# Patient Record
Sex: Male | Born: 1940 | Race: Black or African American | Hispanic: No | Marital: Married | Smoking: Never smoker
Health system: Southern US, Community
[De-identification: ages and names within clinical notes are randomized; demographics above are authoritative.]

## PROBLEM LIST (undated history)

## (undated) DIAGNOSIS — E119 Type 2 diabetes mellitus without complications: Secondary | ICD-10-CM

## (undated) DIAGNOSIS — G309 Alzheimer's disease, unspecified: Secondary | ICD-10-CM

## (undated) DIAGNOSIS — F0280 Dementia in other diseases classified elsewhere without behavioral disturbance: Secondary | ICD-10-CM

## (undated) DIAGNOSIS — N183 Chronic kidney disease, stage 3 unspecified: Secondary | ICD-10-CM

## (undated) DIAGNOSIS — I639 Cerebral infarction, unspecified: Secondary | ICD-10-CM

## (undated) DIAGNOSIS — I10 Essential (primary) hypertension: Secondary | ICD-10-CM

## (undated) DIAGNOSIS — M109 Gout, unspecified: Secondary | ICD-10-CM

## (undated) HISTORY — PX: TIBIA FRACTURE SURGERY: SHX806

## (undated) HISTORY — DX: Gout, unspecified: M10.9

## (undated) HISTORY — DX: Essential (primary) hypertension: I10

---

## 2005-01-17 ENCOUNTER — Ambulatory Visit: Payer: Self-pay | Admitting: Unknown Physician Specialty

## 2007-02-17 ENCOUNTER — Ambulatory Visit: Payer: Self-pay | Admitting: Ophthalmology

## 2012-10-23 ENCOUNTER — Emergency Department: Payer: Self-pay | Admitting: Emergency Medicine

## 2012-10-23 LAB — CBC
HCT: 37.6 % — ABNORMAL LOW (ref 40.0–52.0)
HGB: 12.1 g/dL — ABNORMAL LOW (ref 13.0–18.0)
MCHC: 32.2 g/dL (ref 32.0–36.0)
RBC: 4.78 10*6/uL (ref 4.40–5.90)
RDW: 19.1 % — ABNORMAL HIGH (ref 11.5–14.5)
WBC: 8.4 10*3/uL (ref 3.8–10.6)

## 2012-10-23 LAB — COMPREHENSIVE METABOLIC PANEL
Alkaline Phosphatase: 65 U/L (ref 50–136)
Anion Gap: 7 (ref 7–16)
Bilirubin,Total: 0.2 mg/dL (ref 0.2–1.0)
Calcium, Total: 9.6 mg/dL (ref 8.5–10.1)
Chloride: 108 mmol/L — ABNORMAL HIGH (ref 98–107)
Co2: 22 mmol/L (ref 21–32)
EGFR (African American): >60
Potassium: 4.7 mmol/L (ref 3.5–5.1)
SGOT(AST): 19 U/L (ref 15–37)

## 2012-10-23 LAB — URINALYSIS, COMPLETE
Bacteria: NONE SEEN
Glucose,UR: NEGATIVE mg/dL (ref 0–75)
Hyaline Cast: 1
Ketone: NEGATIVE
Leukocyte Esterase: NEGATIVE
Ph: 5 (ref 4.5–8.0)
Protein: NEGATIVE
Squamous Epithelial: NONE SEEN
WBC UR: <1 /HPF (ref 0–5)

## 2012-10-23 LAB — CK TOTAL AND CKMB (NOT AT ARMC)
CK, Total: 100 U/L (ref 35–232)
CK-MB: 1.4 ng/mL (ref 0.5–3.6)

## 2012-10-23 LAB — LIPASE, BLOOD: Lipase: 197 U/L (ref 73–393)

## 2012-12-31 ENCOUNTER — Ambulatory Visit: Payer: Self-pay | Admitting: Internal Medicine

## 2013-01-15 LAB — COMPREHENSIVE METABOLIC PANEL
Alkaline Phosphatase: 75 U/L (ref 50–136)
Calcium, Total: 9.2 mg/dL (ref 8.5–10.1)
Chloride: 104 mmol/L (ref 98–107)
Co2: 27 mmol/L (ref 21–32)
Creatinine: 1.76 mg/dL — ABNORMAL HIGH (ref 0.60–1.30)
Osmolality: 278 (ref 275–301)
Potassium: 4.2 mmol/L (ref 3.5–5.1)
SGOT(AST): 15 U/L (ref 15–37)
SGPT (ALT): 13 U/L (ref 12–78)

## 2013-01-15 LAB — CBC WITH DIFFERENTIAL/PLATELET
Basophil #: 0 10*3/uL (ref 0.0–0.1)
Eosinophil #: 0.2 10*3/uL (ref 0.0–0.7)
HCT: 36.5 % — ABNORMAL LOW (ref 40.0–52.0)
HGB: 12.2 g/dL — ABNORMAL LOW (ref 13.0–18.0)
Lymphocyte #: 2.9 10*3/uL (ref 1.0–3.6)
Lymphocyte %: 25.7 %
MCH: 27 pg (ref 26.0–34.0)
MCHC: 33.3 g/dL (ref 32.0–36.0)
Monocyte %: 7.6 %
Neutrophil #: 7.2 10*3/uL — ABNORMAL HIGH (ref 1.4–6.5)
Platelet: 215 10*3/uL (ref 150–440)
RBC: 4.5 10*6/uL (ref 4.40–5.90)
RDW: 17.6 % — ABNORMAL HIGH (ref 11.5–14.5)
WBC: 11.2 10*3/uL — ABNORMAL HIGH (ref 3.8–10.6)

## 2013-01-15 LAB — ETHANOL
Ethanol %: <0.003 % (ref 0.000–0.080)
Ethanol: <3 mg/dL

## 2013-01-15 LAB — CK TOTAL AND CKMB (NOT AT ARMC): CK-MB: <0.5 ng/mL — ABNORMAL LOW (ref 0.5–3.6)

## 2013-01-15 LAB — PROTIME-INR: INR: 1.1

## 2013-01-16 LAB — CBC WITH DIFFERENTIAL/PLATELET
Basophil #: 0.1 10*3/uL (ref 0.0–0.1)
Eosinophil %: 4.3 %
HCT: 35 % — ABNORMAL LOW (ref 40.0–52.0)
Lymphocyte #: 2.8 10*3/uL (ref 1.0–3.6)
Lymphocyte %: 26.9 %
MCH: 27.6 pg (ref 26.0–34.0)
MCHC: 33.7 g/dL (ref 32.0–36.0)
Monocyte #: 0.9 x10 3/mm (ref 0.2–1.0)
Monocyte %: 8.7 %
Neutrophil %: 59.5 %
Platelet: 224 10*3/uL (ref 150–440)
RBC: 4.28 10*6/uL — ABNORMAL LOW (ref 4.40–5.90)
RDW: 17.8 % — ABNORMAL HIGH (ref 11.5–14.5)

## 2013-01-16 LAB — URINALYSIS, COMPLETE
Bacteria: NONE SEEN
Bilirubin,UR: NEGATIVE
Blood: NEGATIVE
Ketone: NEGATIVE
Leukocyte Esterase: NEGATIVE
Protein: NEGATIVE
RBC,UR: <1 /HPF (ref 0–5)
Specific Gravity: 1.012 (ref 1.003–1.030)
Squamous Epithelial: NONE SEEN

## 2013-01-16 LAB — BASIC METABOLIC PANEL
Chloride: 102 mmol/L (ref 98–107)
Co2: 28 mmol/L (ref 21–32)
EGFR (African American): 42 — ABNORMAL LOW
EGFR (Non-African Amer.): 36 — ABNORMAL LOW
Glucose: 104 mg/dL — ABNORMAL HIGH (ref 65–99)
Osmolality: 277 (ref 275–301)

## 2013-01-16 LAB — MAGNESIUM: Magnesium: 1.7 mg/dL — ABNORMAL LOW

## 2013-01-17 ENCOUNTER — Inpatient Hospital Stay: Payer: Self-pay | Admitting: Internal Medicine

## 2013-01-17 LAB — BASIC METABOLIC PANEL
BUN: 28 mg/dL — ABNORMAL HIGH (ref 7–18)
Calcium, Total: 9 mg/dL (ref 8.5–10.1)
Co2: 25 mmol/L (ref 21–32)
Creatinine: 1.43 mg/dL — ABNORMAL HIGH (ref 0.60–1.30)
EGFR (African American): 56 — ABNORMAL LOW
Osmolality: 275 (ref 275–301)
Sodium: 134 mmol/L — ABNORMAL LOW (ref 136–145)

## 2013-01-18 LAB — CBC WITH DIFFERENTIAL/PLATELET
Eosinophil #: 0.1 10*3/uL (ref 0.0–0.7)
Eosinophil %: 0.8 %
HCT: 33.4 % — ABNORMAL LOW (ref 40.0–52.0)
MCH: 27.1 pg (ref 26.0–34.0)
MCHC: 33.4 g/dL (ref 32.0–36.0)
Monocyte #: 1.4 x10 3/mm — ABNORMAL HIGH (ref 0.2–1.0)
Neutrophil #: 7.1 10*3/uL — ABNORMAL HIGH (ref 1.4–6.5)
WBC: 11.1 10*3/uL — ABNORMAL HIGH (ref 3.8–10.6)

## 2013-01-18 LAB — BASIC METABOLIC PANEL
Anion Gap: 6 — ABNORMAL LOW (ref 7–16)
BUN: 25 mg/dL — ABNORMAL HIGH (ref 7–18)
Calcium, Total: 9.3 mg/dL (ref 8.5–10.1)
Chloride: 103 mmol/L (ref 98–107)
Co2: 25 mmol/L (ref 21–32)
Creatinine: 1.37 mg/dL — ABNORMAL HIGH (ref 0.60–1.30)
Glucose: 117 mg/dL — ABNORMAL HIGH (ref 65–99)
Osmolality: 274 (ref 275–301)
Sodium: 134 mmol/L — ABNORMAL LOW (ref 136–145)

## 2013-01-21 LAB — WBC: WBC: 23.7 10*3/uL — ABNORMAL HIGH (ref 3.8–10.6)

## 2013-01-21 LAB — CREATININE, SERUM: EGFR (African American): 42 — ABNORMAL LOW

## 2013-01-22 LAB — CBC WITH DIFFERENTIAL/PLATELET
Eosinophil: 1 %
HGB: 10.8 g/dL — ABNORMAL LOW (ref 13.0–18.0)
MCH: 26.9 pg (ref 26.0–34.0)
MCV: 80 fL (ref 80–100)
Segmented Neutrophils: 73 %
Variant Lymphocyte - H1-Rlymph: 3 %
WBC: 18.4 10*3/uL — ABNORMAL HIGH (ref 3.8–10.6)

## 2013-01-22 LAB — BASIC METABOLIC PANEL
BUN: 60 mg/dL — ABNORMAL HIGH (ref 7–18)
Calcium, Total: 9.6 mg/dL (ref 8.5–10.1)
Chloride: 98 mmol/L (ref 98–107)
Co2: 22 mmol/L (ref 21–32)
Creatinine: 1.71 mg/dL — ABNORMAL HIGH (ref 0.60–1.30)
EGFR (African American): 45 — ABNORMAL LOW
EGFR (Non-African Amer.): 39 — ABNORMAL LOW
Glucose: 165 mg/dL — ABNORMAL HIGH (ref 65–99)
Osmolality: 278 (ref 275–301)

## 2013-01-22 LAB — PLATELET COUNT: Platelet: 227 10*3/uL (ref 150–440)

## 2013-01-23 LAB — CBC WITH DIFFERENTIAL/PLATELET
Basophil #: 0.1 10*3/uL (ref 0.0–0.1)
Eosinophil #: 0.1 10*3/uL (ref 0.0–0.7)
Eosinophil %: 0.5 %
HCT: 31.4 % — ABNORMAL LOW (ref 40.0–52.0)
HGB: 10.5 g/dL — ABNORMAL LOW (ref 13.0–18.0)
MCH: 26.7 pg (ref 26.0–34.0)
MCHC: 33.5 g/dL (ref 32.0–36.0)
Monocyte #: 1.6 x10 3/mm — ABNORMAL HIGH (ref 0.2–1.0)
Monocyte %: 7.8 %
Neutrophil #: 15.5 10*3/uL — ABNORMAL HIGH (ref 1.4–6.5)
Neutrophil %: 75.1 %
Platelet: 260 10*3/uL (ref 150–440)

## 2013-01-23 LAB — BASIC METABOLIC PANEL
Anion Gap: 11 (ref 7–16)
Calcium, Total: 9.8 mg/dL (ref 8.5–10.1)
Chloride: 98 mmol/L (ref 98–107)
Co2: 21 mmol/L (ref 21–32)
EGFR (African American): 39 — ABNORMAL LOW
EGFR (Non-African Amer.): 34 — ABNORMAL LOW
Osmolality: 283 (ref 275–301)
Potassium: 4.3 mmol/L (ref 3.5–5.1)
Sodium: 130 mmol/L — ABNORMAL LOW (ref 136–145)

## 2013-01-23 LAB — VANCOMYCIN, RANDOM: Vancomycin, Random: 29 ug/mL

## 2013-01-24 LAB — CBC WITH DIFFERENTIAL/PLATELET
HCT: 28.8 % — ABNORMAL LOW (ref 40.0–52.0)
Lymphocytes: 24 %
MCHC: 33.4 g/dL (ref 32.0–36.0)
MCV: 80 fL (ref 80–100)
Platelet: 294 10*3/uL (ref 150–440)
RBC: 3.58 10*6/uL — ABNORMAL LOW (ref 4.40–5.90)
RDW: 18.2 % — ABNORMAL HIGH (ref 11.5–14.5)
Variant Lymphocyte - H1-Rlymph: 2 %
WBC: 25.7 10*3/uL — ABNORMAL HIGH (ref 3.8–10.6)

## 2013-01-24 LAB — BASIC METABOLIC PANEL
Anion Gap: 11 (ref 7–16)
Calcium, Total: 9.5 mg/dL (ref 8.5–10.1)
Co2: 21 mmol/L (ref 21–32)
EGFR (African American): 33 — ABNORMAL LOW
EGFR (Non-African Amer.): 28 — ABNORMAL LOW
Potassium: 4.4 mmol/L (ref 3.5–5.1)
Sodium: 130 mmol/L — ABNORMAL LOW (ref 136–145)

## 2013-01-24 LAB — PROTEIN / CREATININE RATIO, URINE: Protein, Random Urine: 36 mg/dL — ABNORMAL HIGH (ref 0–12)

## 2013-01-25 LAB — CREATININE, SERUM
Creatinine: 1.86 mg/dL — ABNORMAL HIGH (ref 0.60–1.30)
EGFR (Non-African Amer.): 35 — ABNORMAL LOW

## 2013-01-25 LAB — VANCOMYCIN, TROUGH: Vancomycin, Trough: 23 ug/mL (ref 10–20)

## 2013-01-25 LAB — PROTEIN ELECTROPHORESIS(ARMC)

## 2013-01-26 LAB — BASIC METABOLIC PANEL
Anion Gap: 9 (ref 7–16)
Calcium, Total: 9.4 mg/dL (ref 8.5–10.1)
Co2: 19 mmol/L — ABNORMAL LOW (ref 21–32)
Creatinine: 1.46 mg/dL — ABNORMAL HIGH (ref 0.60–1.30)
EGFR (African American): 55 — ABNORMAL LOW
EGFR (Non-African Amer.): 47 — ABNORMAL LOW
Osmolality: 281 (ref 275–301)
Sodium: 134 mmol/L — ABNORMAL LOW (ref 136–145)

## 2013-01-26 LAB — CBC WITH DIFFERENTIAL/PLATELET
Eosinophil: 1 %
HCT: 26.3 % — ABNORMAL LOW (ref 40.0–52.0)
MCH: 28.6 pg (ref 26.0–34.0)
MCHC: 36.1 g/dL — ABNORMAL HIGH (ref 32.0–36.0)
Monocytes: 6 %
RBC: 3.31 10*6/uL — ABNORMAL LOW (ref 4.40–5.90)
Segmented Neutrophils: 72 %

## 2013-01-26 LAB — VANCOMYCIN, TROUGH: Vancomycin, Trough: 15 ug/mL (ref 10–20)

## 2013-01-26 LAB — UR PROT ELECTROPHORESIS, URINE RANDOM

## 2013-01-26 LAB — CULTURE, BLOOD (SINGLE)

## 2013-01-27 LAB — CBC WITH DIFFERENTIAL/PLATELET
HCT: 30.8 % — ABNORMAL LOW (ref 40.0–52.0)
HGB: 10.3 g/dL — ABNORMAL LOW (ref 13.0–18.0)
MCH: 26.8 pg (ref 26.0–34.0)
MCV: 80 fL (ref 80–100)
Metamyelocyte: 3 %
Monocytes: 7 %
Myelocyte: 1 %
NRBC/100 WBC: 1 /
Platelet: 229 10*3/uL (ref 150–440)
RDW: 18.6 % — ABNORMAL HIGH (ref 11.5–14.5)
Segmented Neutrophils: 75 %

## 2013-01-27 LAB — CREATININE, SERUM
EGFR (African American): >60
EGFR (Non-African Amer.): 53 — ABNORMAL LOW

## 2013-01-27 LAB — PROTIME-INR
INR: 1.4
Prothrombin Time: 16.7 secs — ABNORMAL HIGH (ref 11.5–14.7)

## 2013-01-27 LAB — CLOSTRIDIUM DIFFICILE BY PCR

## 2013-01-28 LAB — VANCOMYCIN, TROUGH: Vancomycin, Trough: 17 ug/mL (ref 10–20)

## 2013-01-28 LAB — APTT
Activated PTT: 56.9 secs — ABNORMAL HIGH (ref 23.6–35.9)
Activated PTT: 150.9 secs — ABNORMAL HIGH (ref 23.6–35.9)

## 2013-01-28 LAB — CBC WITH DIFFERENTIAL/PLATELET
Bands: 4 %
Basophil: 0 %
Eosinophil: 1 %
HGB: 9.8 g/dL — ABNORMAL LOW (ref 13.0–18.0)
Lymphocytes: 15 %
MCHC: 33.5 g/dL (ref 32.0–36.0)
MCV: 79 fL — ABNORMAL LOW (ref 80–100)
Metamyelocyte: 3 %
Myelocyte: 1 %
Platelet: 59 10*3/uL — ABNORMAL LOW (ref 150–440)
Segmented Neutrophils: 68 %
WBC: 19.1 10*3/uL — ABNORMAL HIGH (ref 3.8–10.6)

## 2013-01-28 LAB — CREATININE, SERUM
Creatinine: 1.45 mg/dL — ABNORMAL HIGH (ref 0.60–1.30)
EGFR (African American): 55 — ABNORMAL LOW

## 2013-01-28 LAB — PLATELET COUNT: Platelet: 52 10*3/uL — ABNORMAL LOW (ref 150–440)

## 2013-01-28 LAB — HEPATIC FUNCTION PANEL A (ARMC)
Albumin: 1.9 g/dL — ABNORMAL LOW (ref 3.4–5.0)
Alkaline Phosphatase: 102 U/L (ref 50–136)
Bilirubin, Direct: 1.2 mg/dL — ABNORMAL HIGH (ref 0.00–0.20)
Bilirubin,Total: 1.5 mg/dL — ABNORMAL HIGH (ref 0.2–1.0)
SGOT(AST): 30 U/L (ref 15–37)
Total Protein: 7.4 g/dL (ref 6.4–8.2)

## 2013-01-28 LAB — PROTIME-INR: INR: 1.4

## 2013-01-29 LAB — CBC WITH DIFFERENTIAL/PLATELET
Comment - H1-Com4: NORMAL
HGB: 9 g/dL — ABNORMAL LOW (ref 13.0–18.0)
Lymphocytes: 15 %
MCH: 26.2 pg (ref 26.0–34.0)
MCHC: 33.2 g/dL (ref 32.0–36.0)
MCV: 79 fL — ABNORMAL LOW (ref 80–100)
Monocytes: 6 %
NRBC/100 WBC: 1 /
RBC: 3.46 10*6/uL — ABNORMAL LOW (ref 4.40–5.90)
RDW: 18.2 % — ABNORMAL HIGH (ref 11.5–14.5)
WBC: 16 10*3/uL — ABNORMAL HIGH (ref 3.8–10.6)

## 2013-01-29 LAB — APTT
Activated PTT: >160 secs (ref 23.6–35.9)
Activated PTT: >160 secs (ref 23.6–35.9)
Activated PTT: >160 secs (ref 23.6–35.9)
Activated PTT: >160 secs (ref 23.6–35.9)

## 2013-01-30 LAB — CBC WITH DIFFERENTIAL/PLATELET
Eosinophil: 1 %
HCT: 28.1 % — ABNORMAL LOW (ref 40.0–52.0)
HGB: 9.4 g/dL — ABNORMAL LOW (ref 13.0–18.0)
MCH: 26.4 pg (ref 26.0–34.0)
MCHC: 33.6 g/dL (ref 32.0–36.0)
MCV: 79 fL — ABNORMAL LOW (ref 80–100)
Platelet: 24 10*3/uL — CL (ref 150–440)
RDW: 18.2 % — ABNORMAL HIGH (ref 11.5–14.5)
WBC: 17 10*3/uL — ABNORMAL HIGH (ref 3.8–10.6)

## 2013-01-30 LAB — APTT
Activated PTT: 102.3 secs — ABNORMAL HIGH (ref 23.6–35.9)
Activated PTT: 151.2 secs — ABNORMAL HIGH (ref 23.6–35.9)

## 2013-01-31 ENCOUNTER — Ambulatory Visit: Payer: Self-pay | Admitting: Nurse Practitioner

## 2013-01-31 LAB — CBC WITH DIFFERENTIAL/PLATELET
Basophil #: 0.1 10*3/uL (ref 0.0–0.1)
HCT: 27.6 % — ABNORMAL LOW (ref 40.0–52.0)
HGB: 9.3 g/dL — ABNORMAL LOW (ref 13.0–18.0)
Lymphocyte #: 2.7 10*3/uL (ref 1.0–3.6)
Lymphocyte %: 18 %
MCV: 78 fL — ABNORMAL LOW (ref 80–100)
Monocyte #: 1.3 x10 3/mm — ABNORMAL HIGH (ref 0.2–1.0)
Neutrophil #: 10.9 10*3/uL — ABNORMAL HIGH (ref 1.4–6.5)
RBC: 3.53 10*6/uL — ABNORMAL LOW (ref 4.40–5.90)
RDW: 18.2 % — ABNORMAL HIGH (ref 11.5–14.5)
WBC: 15.1 10*3/uL — ABNORMAL HIGH (ref 3.8–10.6)

## 2013-01-31 LAB — APTT
Activated PTT: 102 secs — ABNORMAL HIGH (ref 23.6–35.9)
Activated PTT: 101.9 secs — ABNORMAL HIGH (ref 23.6–35.9)
Activated PTT: 74.6 secs — ABNORMAL HIGH (ref 23.6–35.9)

## 2013-01-31 LAB — BASIC METABOLIC PANEL
Anion Gap: 7 (ref 7–16)
Calcium, Total: 9.2 mg/dL (ref 8.5–10.1)
Co2: 22 mmol/L (ref 21–32)
Creatinine: 1.43 mg/dL — ABNORMAL HIGH (ref 0.60–1.30)
EGFR (African American): 56 — ABNORMAL LOW
EGFR (Non-African Amer.): 49 — ABNORMAL LOW
Glucose: 134 mg/dL — ABNORMAL HIGH (ref 65–99)
Osmolality: 284 (ref 275–301)
Potassium: 4.4 mmol/L (ref 3.5–5.1)
Sodium: 134 mmol/L — ABNORMAL LOW (ref 136–145)

## 2013-02-01 LAB — PLATELET COUNT: Platelet: 35 10*3/uL — ABNORMAL LOW (ref 150–440)

## 2013-02-01 LAB — CBC WITH DIFFERENTIAL/PLATELET
Bands: 1 %
HCT: 26.6 % — ABNORMAL LOW (ref 40.0–52.0)
HGB: 8.8 g/dL — ABNORMAL LOW (ref 13.0–18.0)
Lymphocytes: 24 %
Monocytes: 10 %
Platelet: 35 10*3/uL — ABNORMAL LOW (ref 150–440)
RBC: 3.38 10*6/uL — ABNORMAL LOW (ref 4.40–5.90)
RDW: 18.8 % — ABNORMAL HIGH (ref 11.5–14.5)
Segmented Neutrophils: 63 %
WBC: 12.3 10*3/uL — ABNORMAL HIGH (ref 3.8–10.6)

## 2013-02-01 LAB — APTT: Activated PTT: 63.9 secs — ABNORMAL HIGH (ref 23.6–35.9)

## 2013-02-02 LAB — BASIC METABOLIC PANEL
BUN: 31 mg/dL — ABNORMAL HIGH (ref 7–18)
Co2: 22 mmol/L (ref 21–32)
Creatinine: 1.1 mg/dL (ref 0.60–1.30)
Potassium: 4.7 mmol/L (ref 3.5–5.1)

## 2013-02-02 LAB — CBC WITH DIFFERENTIAL/PLATELET
Basophil #: 0 10*3/uL (ref 0.0–0.1)
Basophil %: 0.4 %
Eosinophil %: 2.1 %
HCT: 28.4 % — ABNORMAL LOW (ref 40.0–52.0)
Lymphocyte #: 2.9 10*3/uL (ref 1.0–3.6)
MCH: 26 pg (ref 26.0–34.0)
MCHC: 33.2 g/dL (ref 32.0–36.0)
Neutrophil %: 64.9 %
Platelet: 40 10*3/uL — ABNORMAL LOW (ref 150–440)
RDW: 18.4 % — ABNORMAL HIGH (ref 11.5–14.5)
WBC: 12.4 10*3/uL — ABNORMAL HIGH (ref 3.8–10.6)

## 2013-02-02 LAB — EXPECTORATED SPUTUM ASSESSMENT W REFEX TO RESP CULTURE

## 2013-02-02 LAB — APTT: Activated PTT: 60.1 secs — ABNORMAL HIGH (ref 23.6–35.9)

## 2013-02-03 LAB — CBC WITH DIFFERENTIAL/PLATELET
Basophil #: 0 10*3/uL (ref 0.0–0.1)
Lymphocyte %: 22.8 %
MCH: 26.3 pg (ref 26.0–34.0)
Monocyte #: 1 x10 3/mm (ref 0.2–1.0)
Monocyte %: 10.5 %
Neutrophil #: 6.4 10*3/uL (ref 1.4–6.5)
Platelet: 54 10*3/uL — ABNORMAL LOW (ref 150–440)
RBC: 3.38 10*6/uL — ABNORMAL LOW (ref 4.40–5.90)
RDW: 18.3 % — ABNORMAL HIGH (ref 11.5–14.5)
WBC: 9.9 10*3/uL (ref 3.8–10.6)

## 2013-02-04 LAB — APTT: Activated PTT: 52.4 secs — ABNORMAL HIGH (ref 23.6–35.9)

## 2013-02-04 LAB — CBC WITH DIFFERENTIAL/PLATELET
Basophil #: 0.1 10*3/uL (ref 0.0–0.1)
Eosinophil #: 0.3 10*3/uL (ref 0.0–0.7)
HGB: 8.4 g/dL — ABNORMAL LOW (ref 13.0–18.0)
Lymphocyte #: 2.8 10*3/uL (ref 1.0–3.6)
MCH: 26.3 pg (ref 26.0–34.0)
Monocyte #: 1 x10 3/mm (ref 0.2–1.0)
Neutrophil %: 59.3 %
Platelet: 68 10*3/uL — ABNORMAL LOW (ref 150–440)
RBC: 3.21 10*6/uL — ABNORMAL LOW (ref 4.40–5.90)
WBC: 10.1 10*3/uL (ref 3.8–10.6)

## 2013-02-04 LAB — PROTIME-INR: INR: 1.9

## 2013-02-05 LAB — PROTIME-INR: INR: 1.9

## 2013-02-06 LAB — CBC WITH DIFFERENTIAL/PLATELET
Basophil #: 0.1 10*3/uL (ref 0.0–0.1)
Basophil %: 1.1 %
Eosinophil %: 2.8 %
Lymphocyte #: 2.7 10*3/uL (ref 1.0–3.6)
Lymphocyte %: 26.4 %
MCH: 25.8 pg — ABNORMAL LOW (ref 26.0–34.0)
MCHC: 32.9 g/dL (ref 32.0–36.0)
MCV: 79 fL — ABNORMAL LOW (ref 80–100)
Monocyte #: 0.7 x10 3/mm (ref 0.2–1.0)
Monocyte %: 7.1 %
Neutrophil #: 6.4 10*3/uL (ref 1.4–6.5)
Platelet: 95 10*3/uL — ABNORMAL LOW (ref 150–440)
RBC: 3.2 10*6/uL — ABNORMAL LOW (ref 4.40–5.90)
RDW: 18.2 % — ABNORMAL HIGH (ref 11.5–14.5)

## 2013-02-06 LAB — APTT: Activated PTT: 62.4 secs — ABNORMAL HIGH (ref 23.6–35.9)

## 2013-02-07 LAB — CBC WITH DIFFERENTIAL/PLATELET
Basophil %: 1 %
Eosinophil %: 2.4 %
Lymphocyte %: 29.9 %
MCH: 25.7 pg — ABNORMAL LOW (ref 26.0–34.0)
MCV: 77 fL — ABNORMAL LOW (ref 80–100)
Monocyte %: 8.5 %
Neutrophil #: 6.6 10*3/uL — ABNORMAL HIGH (ref 1.4–6.5)
Neutrophil %: 58.2 %
Platelet: 114 10*3/uL — ABNORMAL LOW (ref 150–440)
RBC: 3.18 10*6/uL — ABNORMAL LOW (ref 4.40–5.90)
RDW: 18.4 % — ABNORMAL HIGH (ref 11.5–14.5)

## 2013-02-07 LAB — PLATELET COUNT: Platelet: 122 10*3/uL — ABNORMAL LOW (ref 150–440)

## 2013-02-07 LAB — APTT: Activated PTT: 59.7 secs — ABNORMAL HIGH (ref 23.6–35.9)

## 2013-02-10 ENCOUNTER — Inpatient Hospital Stay: Payer: Self-pay | Admitting: Internal Medicine

## 2013-02-10 LAB — URINALYSIS, COMPLETE
Bilirubin,UR: NEGATIVE
Blood: NEGATIVE
Glucose,UR: NEGATIVE mg/dL (ref 0–75)
Ketone: NEGATIVE
Leukocyte Esterase: NEGATIVE
Nitrite: NEGATIVE
Protein: NEGATIVE
RBC,UR: 1 /HPF (ref 0–5)
Specific Gravity: 1.014 (ref 1.003–1.030)
Squamous Epithelial: <1
WBC UR: 2 /HPF (ref 0–5)

## 2013-02-10 LAB — COMPREHENSIVE METABOLIC PANEL
Alkaline Phosphatase: 48 U/L — ABNORMAL LOW (ref 50–136)
Anion Gap: 16 (ref 7–16)
Bilirubin,Total: 1 mg/dL (ref 0.2–1.0)
Chloride: 109 mmol/L — ABNORMAL HIGH (ref 98–107)
Co2: 16 mmol/L — ABNORMAL LOW (ref 21–32)
Creatinine: 1.54 mg/dL — ABNORMAL HIGH (ref 0.60–1.30)
EGFR (African American): 51 — ABNORMAL LOW
EGFR (Non-African Amer.): 44 — ABNORMAL LOW
Osmolality: 287 (ref 275–301)
Potassium: 3.5 mmol/L (ref 3.5–5.1)
Sodium: 141 mmol/L (ref 136–145)
Total Protein: 5.7 g/dL — ABNORMAL LOW (ref 6.4–8.2)

## 2013-02-10 LAB — CBC WITH DIFFERENTIAL/PLATELET
Eosinophil #: 0.2 10*3/uL (ref 0.0–0.7)
Eosinophil %: 1.7 %
HCT: 25 % — ABNORMAL LOW (ref 40.0–52.0)
HGB: 8.2 g/dL — ABNORMAL LOW (ref 13.0–18.0)
Lymphocyte %: 25 %
MCH: 25.5 pg — ABNORMAL LOW (ref 26.0–34.0)
MCHC: 32.8 g/dL (ref 32.0–36.0)
MCV: 78 fL — ABNORMAL LOW (ref 80–100)
Monocyte #: 0.7 x10 3/mm (ref 0.2–1.0)
Neutrophil %: 65.2 %
Platelet: 195 10*3/uL (ref 150–440)
RBC: 3.22 10*6/uL — ABNORMAL LOW (ref 4.40–5.90)
WBC: 10.5 10*3/uL (ref 3.8–10.6)

## 2013-02-10 LAB — PROTIME-INR: INR: 3.3

## 2013-02-11 LAB — BASIC METABOLIC PANEL
BUN: 35 mg/dL — ABNORMAL HIGH (ref 7–18)
Calcium, Total: 8.8 mg/dL (ref 8.5–10.1)
Co2: 23 mmol/L (ref 21–32)
Creatinine: 1.55 mg/dL — ABNORMAL HIGH (ref 0.60–1.30)
EGFR (Non-African Amer.): 44 — ABNORMAL LOW
Glucose: 114 mg/dL — ABNORMAL HIGH (ref 65–99)
Osmolality: 271 (ref 275–301)
Potassium: 4.7 mmol/L (ref 3.5–5.1)
Sodium: 131 mmol/L — ABNORMAL LOW (ref 136–145)

## 2013-02-11 LAB — CBC WITH DIFFERENTIAL/PLATELET
Basophil #: 0.1 10*3/uL (ref 0.0–0.1)
Basophil %: 0.9 %
Eosinophil #: 0.4 10*3/uL (ref 0.0–0.7)
HCT: 23.9 % — ABNORMAL LOW (ref 40.0–52.0)
HGB: 7.9 g/dL — ABNORMAL LOW (ref 13.0–18.0)
MCHC: 33 g/dL (ref 32.0–36.0)
Monocyte #: 1 x10 3/mm (ref 0.2–1.0)
Monocyte %: 9.4 %
Neutrophil #: 5.9 10*3/uL (ref 1.4–6.5)
Neutrophil %: 55 %
RBC: 3.11 10*6/uL — ABNORMAL LOW (ref 4.40–5.90)
RDW: 18.8 % — ABNORMAL HIGH (ref 11.5–14.5)

## 2013-03-05 ENCOUNTER — Inpatient Hospital Stay: Payer: Self-pay | Admitting: Internal Medicine

## 2013-03-05 LAB — COMPREHENSIVE METABOLIC PANEL
Albumin: 2.2 g/dL — ABNORMAL LOW (ref 3.4–5.0)
Anion Gap: 10 (ref 7–16)
BUN: 45 mg/dL — ABNORMAL HIGH (ref 7–18)
Bilirubin,Total: 0.4 mg/dL (ref 0.2–1.0)
Calcium, Total: 9.9 mg/dL (ref 8.5–10.1)
Creatinine: 1.66 mg/dL — ABNORMAL HIGH (ref 0.60–1.30)
EGFR (African American): 47 — ABNORMAL LOW
EGFR (Non-African Amer.): 41 — ABNORMAL LOW
SGOT(AST): 40 U/L — ABNORMAL HIGH (ref 15–37)
Sodium: 134 mmol/L — ABNORMAL LOW (ref 136–145)
Total Protein: 8.1 g/dL (ref 6.4–8.2)

## 2013-03-05 LAB — URINALYSIS, COMPLETE
Blood: NEGATIVE
Ketone: NEGATIVE
Nitrite: NEGATIVE
Protein: NEGATIVE
RBC,UR: <1 /HPF (ref 0–5)
WBC UR: 4 /HPF (ref 0–5)

## 2013-03-05 LAB — PROTIME-INR
INR: 1.6
Prothrombin Time: 18.6 secs — ABNORMAL HIGH (ref 11.5–14.7)

## 2013-03-05 LAB — CBC
HCT: 24.5 % — ABNORMAL LOW (ref 40.0–52.0)
MCH: 23.9 pg — ABNORMAL LOW (ref 26.0–34.0)
MCHC: 32.4 g/dL (ref 32.0–36.0)
Platelet: 647 10*3/uL — ABNORMAL HIGH (ref 150–440)
RBC: 3.31 10*6/uL — ABNORMAL LOW (ref 4.40–5.90)
WBC: 14.1 10*3/uL — ABNORMAL HIGH (ref 3.8–10.6)

## 2013-03-06 LAB — BASIC METABOLIC PANEL
Calcium, Total: 9.6 mg/dL (ref 8.5–10.1)
Chloride: 100 mmol/L (ref 98–107)
Creatinine: 1.52 mg/dL — ABNORMAL HIGH (ref 0.60–1.30)
EGFR (African American): 52 — ABNORMAL LOW
Osmolality: 277 (ref 275–301)
Potassium: 4.6 mmol/L (ref 3.5–5.1)

## 2013-03-06 LAB — CBC WITH DIFFERENTIAL/PLATELET
Basophil #: 0.1 10*3/uL (ref 0.0–0.1)
HGB: 7.8 g/dL — ABNORMAL LOW (ref 13.0–18.0)
Lymphocyte %: 24.2 %
MCH: 24.4 pg — ABNORMAL LOW (ref 26.0–34.0)
MCHC: 32.7 g/dL (ref 32.0–36.0)
MCV: 75 fL — ABNORMAL LOW (ref 80–100)
Monocyte #: 0.5 x10 3/mm (ref 0.2–1.0)
Neutrophil %: 67.6 %
Platelet: 561 10*3/uL — ABNORMAL HIGH (ref 150–440)
RBC: 3.17 10*6/uL — ABNORMAL LOW (ref 4.40–5.90)
RDW: 19.2 % — ABNORMAL HIGH (ref 11.5–14.5)
WBC: 11.1 10*3/uL — ABNORMAL HIGH (ref 3.8–10.6)

## 2013-03-06 LAB — IRON AND TIBC
Iron Bind.Cap.(Total): 133 ug/dL — ABNORMAL LOW (ref 250–450)
Iron Saturation: 28 %
Iron: 37 ug/dL — ABNORMAL LOW (ref 65–175)

## 2013-03-06 LAB — PROTIME-INR
INR: 1.8
Prothrombin Time: 20.9 secs — ABNORMAL HIGH (ref 11.5–14.7)

## 2013-03-06 LAB — HEMOGLOBIN A1C: Hemoglobin A1C: 6.6 % — ABNORMAL HIGH (ref 4.2–6.3)

## 2013-03-06 LAB — AMMONIA: Ammonia, Plasma: <25 mcmol/L (ref 11–32)

## 2013-03-06 LAB — TSH: Thyroid Stimulating Horm: 2.41 u[IU]/mL

## 2013-03-07 LAB — COMPREHENSIVE METABOLIC PANEL
Albumin: 1.9 g/dL — ABNORMAL LOW (ref 3.4–5.0)
Alkaline Phosphatase: 66 U/L (ref 50–136)
BUN: 27 mg/dL — ABNORMAL HIGH (ref 7–18)
Bilirubin,Total: 0.4 mg/dL (ref 0.2–1.0)
Co2: 25 mmol/L (ref 21–32)
Creatinine: 1.31 mg/dL — ABNORMAL HIGH (ref 0.60–1.30)
EGFR (African American): >60
EGFR (Non-African Amer.): 54 — ABNORMAL LOW
Osmolality: 264 (ref 275–301)
Potassium: 3.9 mmol/L (ref 3.5–5.1)
SGOT(AST): 35 U/L (ref 15–37)
SGPT (ALT): 30 U/L (ref 12–78)
Sodium: 128 mmol/L — ABNORMAL LOW (ref 136–145)
Total Protein: 7 g/dL (ref 6.4–8.2)

## 2013-03-07 LAB — CBC WITH DIFFERENTIAL/PLATELET
Basophil #: 0.1 10*3/uL (ref 0.0–0.1)
Basophil %: 1.1 %
Eosinophil %: 2.7 %
HCT: 22 % — ABNORMAL LOW (ref 40.0–52.0)
Lymphocyte #: 4 10*3/uL — ABNORMAL HIGH (ref 1.0–3.6)
Lymphocyte %: 38.7 %
MCV: 75 fL — ABNORMAL LOW (ref 80–100)
Monocyte %: 7 %
Neutrophil #: 5.2 10*3/uL (ref 1.4–6.5)
Platelet: 507 10*3/uL — ABNORMAL HIGH (ref 150–440)
RDW: 18.8 % — ABNORMAL HIGH (ref 11.5–14.5)

## 2013-03-07 LAB — OCCULT BLOOD X 1 CARD TO LAB, STOOL: Occult Blood, Feces: NEGATIVE

## 2013-03-08 LAB — PROTIME-INR
INR: 2.2
Prothrombin Time: 23.9 secs — ABNORMAL HIGH (ref 11.5–14.7)

## 2013-03-09 LAB — CBC WITH DIFFERENTIAL/PLATELET
Basophil %: 2.3 %
HCT: 21.8 % — ABNORMAL LOW (ref 40.0–52.0)
HGB: 7.1 g/dL — ABNORMAL LOW (ref 13.0–18.0)
Lymphocyte %: 45.2 %
MCH: 24.2 pg — ABNORMAL LOW (ref 26.0–34.0)
MCV: 74 fL — ABNORMAL LOW (ref 80–100)
Monocyte #: 0.9 x10 3/mm (ref 0.2–1.0)
Monocyte %: 8.9 %
Neutrophil %: 38.9 %
Platelet: 463 10*3/uL — ABNORMAL HIGH (ref 150–440)
RBC: 2.94 10*6/uL — ABNORMAL LOW (ref 4.40–5.90)

## 2013-03-09 LAB — BASIC METABOLIC PANEL
BUN: 13 mg/dL (ref 7–18)
Calcium, Total: 9 mg/dL (ref 8.5–10.1)
Chloride: 104 mmol/L (ref 98–107)
Creatinine: 1.15 mg/dL (ref 0.60–1.30)
EGFR (Non-African Amer.): >60
Glucose: 134 mg/dL — ABNORMAL HIGH (ref 65–99)
Osmolality: 268 (ref 275–301)
Potassium: 4.3 mmol/L (ref 3.5–5.1)

## 2013-03-09 LAB — PROTIME-INR: INR: 2.1

## 2013-03-10 LAB — PROTIME-INR
INR: 1.8
Prothrombin Time: 20.3 secs — ABNORMAL HIGH (ref 11.5–14.7)

## 2013-03-10 LAB — HEMOGLOBIN: HGB: 7 g/dL — ABNORMAL LOW (ref 13.0–18.0)

## 2013-03-11 LAB — CULTURE, BLOOD (SINGLE)

## 2013-04-03 ENCOUNTER — Inpatient Hospital Stay: Payer: Self-pay | Admitting: Internal Medicine

## 2013-04-03 LAB — DRUG SCREEN, URINE
Cannabinoid 50 Ng, Ur ~~LOC~~: NEGATIVE (ref ?–50)
Cocaine Metabolite,Ur ~~LOC~~: NEGATIVE (ref ?–300)
Opiate, Ur Screen: NEGATIVE (ref ?–300)
Phencyclidine (PCP) Ur S: NEGATIVE (ref ?–25)
Tricyclic, Ur Screen: NEGATIVE (ref ?–1000)

## 2013-04-03 LAB — URINALYSIS, COMPLETE
Bilirubin,UR: NEGATIVE
Blood: NEGATIVE
Glucose,UR: NEGATIVE mg/dL (ref 0–75)
Leukocyte Esterase: NEGATIVE
Nitrite: NEGATIVE
Ph: 5 (ref 4.5–8.0)
Protein: NEGATIVE
Specific Gravity: 1.014 (ref 1.003–1.030)
Squamous Epithelial: NONE SEEN
WBC UR: 2 /HPF (ref 0–5)

## 2013-04-03 LAB — CBC WITH DIFFERENTIAL/PLATELET
Basophil #: 0.1 10*3/uL (ref 0.0–0.1)
Basophil %: 0.8 %
Eosinophil %: 2.6 %
Lymphocyte #: 2.8 10*3/uL (ref 1.0–3.6)
Lymphocyte %: 24.1 %
MCH: 23.7 pg — ABNORMAL LOW (ref 26.0–34.0)
MCHC: 32.3 g/dL (ref 32.0–36.0)
MCV: 73 fL — ABNORMAL LOW (ref 80–100)
Monocyte #: 0.5 x10 3/mm (ref 0.2–1.0)
Monocyte %: 4 %
Neutrophil #: 8 10*3/uL — ABNORMAL HIGH (ref 1.4–6.5)
Platelet: 514 10*3/uL — ABNORMAL HIGH (ref 150–440)
RBC: 3.64 10*6/uL — ABNORMAL LOW (ref 4.40–5.90)
RDW: 20.3 % — ABNORMAL HIGH (ref 11.5–14.5)

## 2013-04-03 LAB — COMPREHENSIVE METABOLIC PANEL
Albumin: 2.2 g/dL — ABNORMAL LOW (ref 3.4–5.0)
Alkaline Phosphatase: 75 U/L (ref 50–136)
Bilirubin,Total: 0.4 mg/dL (ref 0.2–1.0)
Calcium, Total: 9 mg/dL (ref 8.5–10.1)
Chloride: 102 mmol/L (ref 98–107)
Co2: 24 mmol/L (ref 21–32)
Creatinine: 2.7 mg/dL — ABNORMAL HIGH (ref 0.60–1.30)
Glucose: 65 mg/dL (ref 65–99)
Potassium: 3.9 mmol/L (ref 3.5–5.1)
Sodium: 134 mmol/L — ABNORMAL LOW (ref 136–145)

## 2013-04-03 LAB — APTT: Activated PTT: 52.1 secs — ABNORMAL HIGH (ref 23.6–35.9)

## 2013-04-04 LAB — BASIC METABOLIC PANEL
BUN: 25 mg/dL — ABNORMAL HIGH (ref 7–18)
Chloride: 99 mmol/L (ref 98–107)
Creatinine: 2.95 mg/dL — ABNORMAL HIGH (ref 0.60–1.30)
EGFR (African American): 23 — ABNORMAL LOW
EGFR (Non-African Amer.): 20 — ABNORMAL LOW
Glucose: 58 mg/dL — ABNORMAL LOW (ref 65–99)
Osmolality: 267 (ref 275–301)
Potassium: 4 mmol/L (ref 3.5–5.1)

## 2013-04-04 LAB — MAGNESIUM: Magnesium: 2.1 mg/dL

## 2013-04-05 LAB — URINALYSIS, COMPLETE
Bilirubin,UR: NEGATIVE
Glucose,UR: NEGATIVE mg/dL
Hyaline Cast: 11
Ketone: NEGATIVE
Nitrite: NEGATIVE
Ph: 5
Protein: NEGATIVE
RBC,UR: 4 /HPF
Specific Gravity: 1.008
Squamous Epithelial: <1
WBC UR: 9 /HPF

## 2013-04-05 LAB — CBC WITH DIFFERENTIAL/PLATELET
Basophil #: 0 10*3/uL (ref 0.0–0.1)
Eosinophil %: 0 %
HCT: 23.3 % — ABNORMAL LOW (ref 40.0–52.0)
Lymphocyte %: 21 %
MCH: 24.5 pg — ABNORMAL LOW (ref 26.0–34.0)
MCV: 73 fL — ABNORMAL LOW (ref 80–100)
Monocyte #: 0.1 x10 3/mm — ABNORMAL LOW (ref 0.2–1.0)
Neutrophil %: 77.7 %
Platelet: 473 10*3/uL — ABNORMAL HIGH (ref 150–440)
RDW: 20.3 % — ABNORMAL HIGH (ref 11.5–14.5)
WBC: 10.2 10*3/uL (ref 3.8–10.6)

## 2013-04-05 LAB — BASIC METABOLIC PANEL
Anion Gap: 3 — ABNORMAL LOW (ref 7–16)
Calcium, Total: 8.7 mg/dL (ref 8.5–10.1)
Chloride: 92 mmol/L — ABNORMAL LOW (ref 98–107)
Co2: 28 mmol/L (ref 21–32)
Creatinine: 2.22 mg/dL — ABNORMAL HIGH (ref 0.60–1.30)
Potassium: 4.7 mmol/L (ref 3.5–5.1)

## 2013-04-05 LAB — PROTIME-INR
INR: 2.1
Prothrombin Time: 23.1 s — ABNORMAL HIGH

## 2013-04-05 LAB — SODIUM: Sodium: 126 mmol/L — ABNORMAL LOW (ref 136–145)

## 2013-04-06 LAB — CBC WITH DIFFERENTIAL/PLATELET
Basophil %: 0.2 %
Eosinophil #: 0 10*3/uL (ref 0.0–0.7)
Eosinophil %: 0.2 %
HCT: 21.8 % — ABNORMAL LOW (ref 40.0–52.0)
HGB: 7.4 g/dL — ABNORMAL LOW (ref 13.0–18.0)
MCHC: 33.8 g/dL (ref 32.0–36.0)
MCV: 72 fL — ABNORMAL LOW (ref 80–100)
Monocyte %: 6.5 %
Neutrophil #: 5.9 10*3/uL (ref 1.4–6.5)
Neutrophil %: 56.3 %
RBC: 3.02 10*6/uL — ABNORMAL LOW (ref 4.40–5.90)

## 2013-04-06 LAB — BASIC METABOLIC PANEL
BUN: 30 mg/dL — ABNORMAL HIGH (ref 7–18)
Calcium, Total: 8.6 mg/dL (ref 8.5–10.1)
Co2: 28 mmol/L (ref 21–32)
Creatinine: 1.64 mg/dL — ABNORMAL HIGH (ref 0.60–1.30)
EGFR (African American): 48 — ABNORMAL LOW
EGFR (Non-African Amer.): 41 — ABNORMAL LOW
Glucose: 58 mg/dL — ABNORMAL LOW (ref 65–99)
Osmolality: 270 (ref 275–301)
Potassium: 4.1 mmol/L (ref 3.5–5.1)
Sodium: 133 mmol/L — ABNORMAL LOW (ref 136–145)

## 2013-04-06 LAB — PROTIME-INR: Prothrombin Time: 22.1 secs — ABNORMAL HIGH (ref 11.5–14.7)

## 2013-04-09 LAB — CULTURE, BLOOD (SINGLE)

## 2013-05-13 ENCOUNTER — Emergency Department: Payer: Self-pay | Admitting: Emergency Medicine

## 2013-05-13 LAB — COMPREHENSIVE METABOLIC PANEL
Alkaline Phosphatase: 67 U/L
Calcium, Total: 10.1 mg/dL (ref 8.5–10.1)
Co2: 26 mmol/L (ref 21–32)
Creatinine: 1.17 mg/dL (ref 0.60–1.30)
EGFR (African American): >60
EGFR (Non-African Amer.): >60
Glucose: 171 mg/dL — ABNORMAL HIGH (ref 65–99)
Total Protein: 7.7 g/dL (ref 6.4–8.2)

## 2013-05-13 LAB — TROPONIN I: Troponin-I: <0.02 ng/mL

## 2013-05-13 LAB — CBC
HGB: 9.3 g/dL — ABNORMAL LOW (ref 13.0–18.0)
MCHC: 31.9 g/dL — ABNORMAL LOW (ref 32.0–36.0)
RDW: 20.5 % — ABNORMAL HIGH (ref 11.5–14.5)

## 2013-08-25 ENCOUNTER — Encounter: Payer: Self-pay | Admitting: Surgery

## 2013-08-31 ENCOUNTER — Encounter: Payer: Self-pay | Admitting: Surgery

## 2013-09-30 ENCOUNTER — Encounter: Payer: Self-pay | Admitting: Surgery

## 2013-10-31 ENCOUNTER — Encounter: Payer: Self-pay | Admitting: Surgery

## 2014-09-22 NOTE — Consult Note (Signed)
Chief Complaint and History:  Referring Physician Dr. Madelon LipsVacchani   Chief Complaint hypoglycemia   Allergies:  Heparin: Other  Assessment/Plan:  Assessment/Plan 74 y/o M with h/o type 2 diabetes admitted 10/4 with AMS and hypoglycemia. Work-up indicated that AMS was due to hypoglycemia caused by inadvertent use of gimpeiride. He had admission for sufonylurea-induced hypoglycemia last month and was advised to stop glimepiride however it seems that was not done. Sugars have been in the 100-140 range in last 48 hrs. Was on D5 until this AM when it was stopped. Eating fairly well, in part due to wife assisting with feeding and in part due to addn of Megace.  A/ Type 2 diabetes Hypoglycemia due to use of sulfonylurea, resolved Confusion at baseline, likely with element of dementia  P -Avoid sulfonylurea -Agree with stopping the IV Dextrose -Monitor sugars q4 hrs. If sugars inc to >150, could consider adding a renally dosed DDP-IV agent such as Januvia 50 mg daily. This is an oral diabetes agent which does not cause hypoglycemia.  Full consult was dictated. I will follow along.   Electronic Signatures: Raj JanusSolum, Ramzey Petrovic M (MD)  (Signed 09-Oct-14 14:01)  Authored: Chief Complaint and History, ALLERGIES, Assessment/Plan   Last Updated: 09-Oct-14 14:01 by Raj JanusSolum, Izzah Pasqua M (MD)
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
## 2014-09-22 NOTE — Discharge Summary (Signed)
PATIENT NAME:  Noah Bush, Noah Bush MR#:  [id number] DATE OF BIRTH:  1941/01/13  ADDENDUM:  Continuation of Discharge Summary.  HISTORY OF PRESENT ILLNESS: The patient is a 74 year old male patient who was discharged to [facility] on September 8. He came in because of altered mental status, hypoglycemia. The patient discharged to [facility] on glyburide and sliding scale. The patient's blood glucose was 30 and the patient's family reported to staff the patient was not acting right, and he had a blood glucose of 30. They gave an amp of D50 and the patient was monitored. The patient was subsequently moved to the hospital. The patient's blood sugar yesterday afternoon in the ER was 59 and persistently had low blood sugar of 49, and the patient was given Glucagon 1 mg by EMS. In the ER, the patient's blood sugar was 63 again, and the patient also was somnolent, so the patient was seen by Dr. Randol KernElgergawy and admitted to ICU, initially started on D10 water at 120 mL/hour.   The patient's oral hypoglycemics were stopped. The patient was monitored in the ICU with Accu-Cheks every 1 hour and the patient received multiple doses of D50 IV push, started on octreotide drip for sulfonylurea-induced hypoglycemia. The patient's blood sugars improved. He was able to come off octreotide drip and also D10. Right now his blood sugar this morning is 101 but previously they were around 150s, 120s, and also 200s.   The patient still has poor p.o. intake. I know him from previous hospitalization, very poorly motivated even to walk with physical therapy. The patient is at his baseline at this time. He is oriented and he knows where he is. He denies any complaints. The patient is stable to go to   health care and resume all the medications as I described except the Coumadin.   The patient needs PT-INR checkup and resume Coumadin for his deep vein thrombosis.   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 Randol KernElgergawy and previous detailed summaries for details.   DISCHARGE VITAL SIGNS: Temperature this morning is 97.8. Blood pressure 110/65, saturations 90% on room air and pulse is 70.   TIME SPENT: More than 30 minutes on dictation.   CODE STATUS: FULL CODE.    ____________________________ Katha HammingSnehalatha Priyal Musquiz, MD sk:np D: 02/11/2013 11:58:00 ET T: 02/11/2013 21:57:34 ET JOB#: [id number]  cc: Jorje GuildGlenn R. Beckey DowningWillett, MD Katha HammingSnehalatha Quinzell Malcomb, MD, <Dictator> Katha HammingSNEHALATHA Wyley Hack MD ELECTRONICALLY SIGNED 02/28/2013 5:09
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
## 2014-09-22 NOTE — Consult Note (Signed)
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   Comments: PF4 antibodies positive Discontinue all heparin/Lovenox. Agree with Argatroban Will follow Transuse if active bleeding or Plt count < 10 000.  Electronic Signatures: Antony Hasteamiah, Lurlean Kernen S (MD)  (Signed 31-Aug-14 12:58)  Authored: Brief Consult Note   Last Updated: 31-Aug-14 12:58 by Antony Hasteamiah, Peniel Hass S (MD)
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
## 2014-09-22 NOTE — Consult Note (Signed)
Brief Consult Note: Diagnosis: Left upper tibia fracture, right hip pain.   Patient was seen by consultant.   Recommend to proceed with surgery or procedure.   Recommend further assessment or treatment.   Orders entered.   Comments: 74 year old male 7 1/2 weeks post hairline fracture left upper tibia fracture below Total Knee Arthroplasty.  Has been in knee immobilizer and pain diminished.  No Physical Therapy so far.  Went home for a few days and was readmitted for glucose problems.  Also complains of right hip pain.    Exam:  Alert and cooperative.  Family present.  circulation/sensation/motor function good both lower extremities. Left knee non tender nor is fracture site.  Passive range of motion of knee painless.  Skin intact and swelling gone.  Pain with range of motion of right hip.  Holds it in external rotation.  Both heels with early pressure sores.  No real necrosis yet.   X-rays: Left knee show Total Knee Arthroplasty unchanged without loosening.  Fracture of tibia unchanged in position with early healing.  Pelvis and right hip pending.   Imp:  Healing left upper tibia fracture         Right hip pain  Rx:  X-rays of knee pending.  Will order X-rays of right hip       Physical Therapy on hold pending further X-rays          Heels off bed at all times..  Electronic Signatures: Valinda HoarMiller, Deaven Urwin E (MD)  (Signed 06-Oct-14 13:06)  Authored: Brief Consult Note   Last Updated: 06-Oct-14 13:06 by Valinda HoarMiller, Gabrian Hoque E (MD)

## 2014-09-22 NOTE — H&P (Signed)
PATIENT NAME:  Noah Bush, BRANHAM MR#:  [id number] DATE OF BIRTH:  January 31, 1941  DATE OF ADMISSION:  03/05/2013  PRIMARY CARE PHYSICIAN:  Barry Brunner, M.D.   CHIEF COMPLAINT:  Altered mental status, hyperglycemia.   HISTORY OF PRESENTING ILLNESS:  A 74 year old male patient with history of diabetes mellitus type 2 on oral hypoglycemics who has had prior episodes of hypoglycemia admission, a heparin-induced thrombocytopenia, hypertension, presents to the hospital ER brought in by EMS after family noticed patient to be confused.  The patient's blood sugar was checked.  It was 50, was given glucagon 1 mg along with D50.  In spite of this there was hypoglycemia, was brought to the Emergency Room.  The patient was given another amp of D50, watched.  In spite of eating food, orange juice, his blood sugars have been continually low in the 40s.  The patient has been started on D5 and the patient is being admitted to the hospitalist service.   The patient was last seen in the hospital and discharged on 02/11/2013 after being treated for hypoglycemia and his glyburide was stopped.  Family does mention that he has been taking a little green pill for his diabetes, although the name is unknown at this time and his pharmacy is closed.   In spite of being on D5, the patient's blood sugar has been low at 51.   PAST MEDICAL HISTORY: 1.  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  2.  Hypertension.  3.  Morbid obesity.  4.  Chronic renal disease.  5.  Gout.  6.  Chronic alcoholism.  7.  A recent heparin-induced thrombocytopenia treated with IV Argatroban and the patient on Coumadin presently.  8.  Right upper extremity DVT on warfarin.  9.  Left tibial hairline fracture.   ALLERGIES:  HEPARIN.   PAST SURGICAL HISTORY:  Leg surgery.   FAMILY HISTORY:  Negative for coronary artery disease.  Significant for an unknown cancer in his grandfather.   SOCIAL HISTORY:  The patient does not smoke.  History of  heavy alcohol use in the past.    REVIEW OF SYSTEMS:  Unobtainable secondary to the patient's confusion.  The patient does converse, but does not reply appropriately to questions.   HOME MEDICATIONS:  Include:  1.  Acetaminophen 325 two tablets oral every four hours as needed.  2.  Allopurinol 300 mg once a day.  3.  Bisacodyl 10 mg rectal daily as needed for constipation.  4.  Coumadin 2.5 mg daily.  5.  Docusate sodium 100 mg two times a day.  6.  Fluoxetine 20 mg oral once a day.  7.  Lasix 20 mg oral once a day.  8.  Insulin sliding scale.  9.  Lisinopril 10 mg oral once a day.  10.  Lovastatin 20 mg oral once a day.  11.  Magnesium oxide 400 mg oral once a day.  12.  Multivitamin 5 mL oral once a day.  13.  Norco 325/5 two tablets oral every four hours as needed.  14.  Protonix 40 mg oral once a day.  15.  Senokot 1 tablet oral 2 times a day.  16.  Timolol ophthalmic 0.5% one drop in each eye 2 times a day.   PHYSICAL EXAMINATION: VITAL SIGNS:  Temperature 98.1, pulse of 109, respirations 20, blood pressure 137/55, saturating 96% on room air.  GENERAL:  Morbidly obese African American male patient lying in bed, confused, restless, trying to get out of bed.  HEENT:  Atraumatic, normocephalic.  Oral mucosa moist and pink.  External ears and nose normal.  Pallor positive.  No icterus.  Pupils bilaterally equal and reactive to light.  NECK:  Supple.  No thyromegaly.  No palpable lymph nodes.  Trachea midline.  No carotid bruit, JVD.  CARDIOVASCULAR:  S1, S2, without any murmurs.  Peripheral pulses 2+, 1+ edema.  RESPIRATORY:  Normal work of breathing.  Clear to auscultation on both sides.  GASTROINTESTINAL:  Soft abdomen, nontender.  Bowel sounds present.  No organomegaly palpable.  SKIN:  Warm and dry.  No petechiae, rash, ulcers.  MUSCULOSKELETAL:  Has left tibial area splint.  No other joint swelling or redness noticed.  NEUROLOGICAL:  Motor strength 4/5 in upper extremities and  3/5 in lower extremities.  LYMPHATIC:  No cervical lymphadenopathy.   LABORATORY STUDIES:  Glucose of 60, 43, 48, 51, BUN 45, creatinine 1.66, sodium 134, potassium 4.5.  GFR 41.  AST, ALT, alkaline phosphatase, bilirubin normal.  Troponin less than 0.02.  WBC 14.1, hemoglobin 10.9, platelets of 647 with MCV 74, INR of 1.6.  Urinalysis shows trace bacteria, 4 WBC.   ASSESSMENT AND PLAN: 1.  Hypoglycemia in a patient with diabetes, on oral hypoglycemics of unknown kind.  The patient is still hypoglycemic and encephalopathic in spite of being on D5.  The patient will be switched to D10 with Accu-Cheks every one hour initially and later can be checked every two hours.  The patient has had recurrent hypoglycemia on being oral hypoglycemics.  The patient does have chronic kidney disease, but his creatinine at baseline seems to be less than 1.6.  He might benefit from only metformin and also Nvokana with these two medications not causing hypoglycemia.  We will check a HbA1c.  The patient is critically ill with high risk for cardiac arrest and death.  Needs close monitoring with Accu-Cheks, D10.  No insulin coverage at this time.  2.  Acute encephalopathy secondary to hypoglycemia.  Will be monitored.  3.  Urinary tract infection with elevated white count.  The patient does have elevated white count and tachycardia and sepsis with urinary tract infection.  This could be causing his hypoglycemia.  We will check blood cultures to make sure he does not have any bacteremia.  Will be started on antibiotics and await culture results.  4.  Hypertension.  Continue medications.  Hold ACE inhibitor secondary to worsening renal function.  5.  Heparin-induced thrombocytopenia, presently platelets are stable.  The patient is on Coumadin.  HEPARIN ADDED TO ALLERGY LIST.  NO HEPARIN FLUSHES TO BE GIVEN.  6.  History of deep vein thrombosis.  The patient is on Coumadin.   CODE STATUS:  FULL CODE.   Time spent today on this  critically ill patient with hypoglycemia needing D10, frequent Accu-Cheks was 60 minutes.     ____________________________ Molinda BailiffSrikar R. Sera Hitsman, MD srs:ea D: 03/05/2013 22:55:00 ET T: 03/05/2013 23:18:26 ET JOB#: [id number]  cc: Jorje GuildGlenn R. Beckey DowningWillett, MD Brea Coleson R. Inda Mcglothen, MD, <Dictator>   Orie FishermanSRIKAR R Yovan Leeman MD ELECTRONICALLY SIGNED 03/27/2013 22:40
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
## 2014-09-22 NOTE — Consult Note (Signed)
Left tibia X-rays yesterday looked good and he may start partial weight bearing with Physical Therapy on this.  Right hip showed mild osteoarthritis and I would recommend an NSAID for that.  No fracture seen on right.  Recommend return to clinic 2 weeks after discharge.    Electronic Signatures: Valinda HoarMiller, Sheryll Dymek E (MD)  (Signed on 07-Oct-14 11:38)  Authored  Last Updated: 07-Oct-14 11:38 by Valinda HoarMiller, Ariyon Mittleman E (MD)

## 2014-09-22 NOTE — Consult Note (Signed)
PATIENT NAME:  Noah Bush, Noah Bush MR#:  [id number] DATE OF BIRTH:  27-Oct-1940  DATE OF CONSULTATION:  03/10/2013  REFERRING PHYSICIAN: Dr. Anselm Jungling. CONSULTING PHYSICIAN:  A. Lavone Orn, MD  CHIEF COMPLAINT: Hypoglycemia.   HISTORY OF PRESENT ILLNESS: This is a 74 year old male with a history of type 2 diabetes, who was admitted on 03/05/2013 for altered mental status and hypoglycemia. Per records and interview with his wife, he had developed confusion the day of admission and they contacted EMS. Blood sugar was 50. He was treated with several amps of D50 and due to persistent hypoglycemia was initiated on IV dextrose drip. He was continued on the drip until 10/06 at 10:00 a.m., when it was stopped. By noon, he had recurrent hypoglycemia with sugar of 65, dropping down to 53 over the next several hours. Dextrose drip was then restarted however it was again stopped this morning. Over the last 48 hours, sugars have ranged 111 - 161.   His current hemoglobin A1c is 6.6%. He was hospitalized last month for hypoglycemia and at that time his glyburide was stopped. According to his wife, the patient manages his own medications and he may have inadvertently continued the glyburide despite that recommendation. He denies taking any insulin and in reviewing outpatient records, insulin has never been included. Outpatient records over the last 6 months do include glimepiride 2 mg b.i.d. only.  Wife reports he has not been eating well. It is not quite clear why. He agrees maybe his appetite is poor, but also states he has weakness and numbness of the right arm, which he attributes to right arm DVT. He also complaints of decreased vision. These challenges make feeding himself difficult. He wife states he prefers to let her feed him. Megace was added for low appetite and his wife thinks that this has been helpful. He is a poor historian.  PAST MEDICAL HISTORY:  1.  Type 2 diabetes. 2.  Hypertension.  3.  Gout.  4.   Hyperlipidemia.  5.  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  6.  Renal insufficiency.  7.  Right upper extremity DVT.  8.  History of heparin-induced thrombocytopenia.  9.  Left tibial hairline fracture.   INPATIENT MEDICATIONS:  1.  Megace 40 mg daily.  2.  EPO 40,000 units one time.  3.  Allopurinol 300 mg daily.  4.  Brimonidine 0.2% eye drops 1 drop to both eyes b.i.d.  5.  Fluoxetine 20 mg daily.  6.  Lovastatin 20 mg daily.  7.  Magnesium oxide 400 mg daily.  8.  Pantoprazole 40 mg daily.  9.  Senokot 1 tab b.i.d.  10. Flomax 0.4 mg daily.  11. Warfarin 2.5 mg daily.   ALLERGIES: HEPARIN.   FAMILY HISTORY: Positive for diabetes.   SOCIAL HISTORY: The patient is married and lives with his family. Denies tobacco use.   REVIEW OF SYSTEMS: GENERAL: Denies weight loss. Denies fevers. HEENT: Again, he has poor vision, especially in the right eye. Denies sore throat. NECK: Denies neck pain or dysphagia. CARDIAC: Denies chest pain or palpitation. PULMONARY: Denies cough or shortness of breath. ABDOMEN: Denies abdominal pain or nausea. EXTREMITIES: Denies leg swelling. He has pain in the left knee due to the fracture. SKIN: Denies rash or recent skin changes. HEME: Denies easy bruisability or recent bleeding. NEURO: No falls. No dizziness.  PHYSICAL EXAMINATION:  VITAL SIGNS: Height 70.9 inches, weight 278 pounds, BMI 38.8, temperature 98.4, pulse 83, respirations 18, blood pressure 117/59,  pulse oximetry 97% on room air.  GENERAL: Obese, African American male in no acute distress.  HEENT: EOMI. Oropharynx is clear. Mucous membranes moist.  NECK: Supple.  CARDIAC: Regular rate and rhythm. No carotid bruit.  LUNGS: Clear to auscultation bilaterally.  ABDOMEN: Diffusely soft, nontender, nondistended.  EXTREMITIES: No edema is present. Left leg is in a brace.  NEUROLOGIC: No appreciable sensory deficits.  PSYCHIATRIC: The patient is pleasant, calm and cooperative.  He is oriented to person and place, but not to time,  SKIN: No rash or dermatopathy.   LABORATORY DATA: Glucose 82, BUN 45, creatinine 1.52, eGFR 52, potassium 4.6, calcium 9.6. Hemoglobin A1c 6.6. Hematocrit 23.7, WBC 11.1 and platelets 561.   ASSESSMENT: 74 year old male with a history of diabetes, admitted with altered mental status in the setting of hypoglycemia attributed to inadvertent use of glimepiride. Hypoglycemia has resolved and his blood sugars have been in reasonable range for the last 48 hours.   RECOMMENDATIONS:  1.  I agree with stopping IV dextrose. Would not restart unless he has recurrence of hypoglycemia with symptoms. Would continue monitoring blood sugars q.3 to 4 hours. Goal blood sugars should be in the range of 70 to 150. Should he have blood sugars over 150 off the IV dextrose, we could initiate low dose DPP 4 agent such as Januvia 50 mg daily. This would be safe in the setting of his minor renal insufficiency and should not contribute to recurrent hypoglycemia. Would not reinitiate any sulfonylureas. Counseled the patient's wife about reviewing his medications in detail and ensuring that there is no glimepiride in the house.  2.  Okay to continue Megace as it seems to be helping, although I think his poor eating habits are multifactorial. Megace can contribute to hyperglycemia, therefore if that becomes a problem, we could reassess and consider stopping Megace.   Thank you for the kind request for consultation. I will follow along with you.  ____________________________ A. Lavone Orn, MD ams:aw D: 03/10/2013 13:56:16 ET T: 03/10/2013 14:29:36 ET JOB#: [id number]  cc: A. Lavone Orn, MD, <Dictator> Sherlon Handing MD ELECTRONICALLY SIGNED 03/15/2013 12:50

## 2014-09-22 NOTE — Op Note (Signed)
PATIENT NAME:  Noah RilingOTEAT, Noah Bush MR#:  [id number] DATE OF BIRTH:  1941-02-23  DATE OF PROCEDURE:  04/04/2013  PREOPERATIVE DIAGNOSES: 1.  Pneumonia.  2.  Septic shock with hypotension.   POSTOPERATIVE DIAGNOSES:  1.  Pneumonia.  2.  Septic shock with hypotension.   PROCEDURES:  1. Ultrasound guidance for vascular access to right brachial vein.  2. Fluoroscopic guidance for placement of catheter.  3. Insertion of peripherally inserted central venous catheter, double lumen, right arm.  SURGEON: Levora DredgeGregory Schnier, MD  ANESTHESIA: Local.   ESTIMATED BLOOD LOSS: Minimal.   INDICATION FOR PROCEDURE: Requiring IV antibiotics for greater than 5 days.  DESCRIPTION OF PROCEDURE: The patient's right arm was sterilely prepped and draped, and a sterile surgical field was created. The brachial vein was accessed under direct ultrasound guidance without difficulty with a micropuncture needle and permanent image was recorded. 0.018 wire was then placed into the superior vena cava. Peel-away sheath was placed over the wire. A single lumen peripherally inserted central venous catheter was then placed over the wire and the wire and peel-away sheath were removed. The catheter tip was placed into the superior vena cava and was secured at the skin at 40 cm with a sterile dressing. The catheter withdrew blood well and flushed easily with heparinized saline. The patient tolerated procedure well.   ____________________________ Renford DillsGregory G. Schnier, MD ggs:dmm D: 04/15/2013 15:06:56 ET T: 04/15/2013 21:58:22 ET JOB#: [id number]  cc: Renford DillsGregory G. Schnier, MD, <Dictator> Renford DillsGREGORY G SCHNIER MD ELECTRONICALLY SIGNED 05/09/2013 17:15

## 2014-09-22 NOTE — Discharge Summary (Signed)
PATIENT NAME:  Noah RilingOTEAT, Noah MR#:  [id number] DATE OF BIRTH:  06-Apr-1941  DATE OF ADMISSION:  04/03/2013 DATE OF DISCHARGE:  04/06/2013  ADMISSION DIAGNOSIS: Hypoglycemia.   DISCHARGE DIAGNOSES: 1. Hypoglycemia from inadvertent use of hypoglycemic agents in a patient who also presented with acute renal failure.  2. Acute renal failure likely from septic shock with acute tubular necrosis. 3. Hypotension, possible septic shock in the setting of pneumonia.  4. Pneumonia hospital acquired.  5. Hyperlipidemia.  6. History of right arm deep vein thrombosis on Coumadin.  7. History of heparin-induced thrombocytopenia.  8. Hyponatremia.  9. Anemia of chronic disease.   HOSPITAL COURSE: This is a 74 year old male with a history of diabetes, who presented earlier in October for hypoglycemia, was taken off his hypoglycemic agents at that time, who presented again with confusion and found to have hypoglycemia and subsequently also had hypotension. For further details, please refer to the H and P.  1. Hypoglycemia. The patient's family apparently was giving him his diabetic medications. He has actually been discontinued prior during his last hospitalization. He was also noted to be in acute renal failure and this is subsequently the reason that the patient was hypoglycemic for a prolonged period. He was placed on D10 drip and did well with this. His renal failure improved and his blood sugars have remained stable. We are discontinuing the use of any diabetic medications at this time due to previous hospitalizations for hypoglycemia.  2. Hypotension, possible septic shock in the setting of a pneumonia as seen on a chest x-ray present on admission.He became hypotensive after he was admitted to the hospital.  He was initially placed on Levophed. I did order a cortisol level and this was actually normal. I initially started some hydrocortisone, but I stopped this. He was on Zosyn and Levaquin as the patient was  recently hospitalized and further treatment of hospital-acquired pneumonia.  The patient's blood pressure did improve and has remained stable after discontinuation of levophed.   4. Hospital acquired pneumonia. The patient on Zosyn and Levaquin. He will be discharged with Levaquin. Blood cultures were negative. 5. Acute renal failure from septic shock with ATN improved. His kidney ultrasound showed no evidence of hydronephrosis.  6. Hyperlipidemia on Mevacor.  7. History of right arm deep vein thrombosis on Coumadin. The patient has an INR of 2.0 at discharge.  8. The patient has a history of HIT.  9. Hyponatremia, which improved.  10. Anemia, which is chronic. His baseline hemoglobin is 7 to 8.   The patient was evaluated by physical therapy. They recommended either short-term rehab or home with home health for 24 hour care. The family decided on home with home health care with 24 hour care.   DISCHARGE LABORATORY DATA: Sodium 132, potassium 4.1, chloride 100, bicarbonate 28, BUN 30, creatinine 1.64, glucose is 96. INR is 2.0. White blood cells 10.5, hemoglobin 7.4, hematocrit 22, platelets of 419.   Renal ultrasound showed no evidence of hydronephrosis.   Blood cultures are negative to date.   MEDICATIONS AT DISCHARGE:  1. Allopurinol 300 mg daily.  2. Brimonidine ophthalmic solution 0.2% b.i.d. to each eye.  3. Docusate 100 mg b.i.d.  4. Lasix 20 mg daily.  5. Lovastatin 20 mg daily.  6. Atenolol 0.5% one drop to each eye b.i.d.  7. Coumadin 2.5 mg daily.  8. Megace 40 mg daily.  9. Lisinopril 10 mg daily.  10. Levaquin 750 mg q.48 hours x 5 days.   Discharge  home health with physical therapy, nurse and nurse aide, monitoring blood pressure and blood sugar.   DISCHARGE DIET: Low sodium and ADA diet.   DISCHARGE ACTIVITY: As tolerated.   DISCHARGE FOLLOW-UP: The patient will follow-up in two days with Dr. Barry Brunner.  TIME SPENT: Approximately 35 minutes.   The patient is  medically stable for discharge.   ____________________________ Mayvis Agudelo P. Juliene Pina, MD spm:sg D: 04/06/2013 14:42:12 ET T: 04/06/2013 15:30:50 ET JOB#: [id number]  cc: Desiraye Rolfson P. Juliene Pina, MD, <Dictator> Janyth Contes Miyako Oelke MD ELECTRONICALLY SIGNED 04/06/2013 20:54

## 2014-09-22 NOTE — Discharge Summary (Signed)
PATIENT NAME:  Noah RilingOTEAT, Deakin MR#:  [id number] DATE OF BIRTH:  04/05/1941  DATE OF ADMISSION:  02/10/2013 DATE OF DISCHARGE:  02/11/2013  DISCHARGE DIAGNOSIS:  Hypoglycemia secondary to oral hypoglycemics, resolved.    OTHER DIAGNOSES:  1.  Hypertension.  2.  Morbid obesity.  3.  Chronic kidney disease stage III.  4.  Heparin-induced thrombocytopenia.  5.  Right arm deep vein thrombosis.  6.  History of depression.  7.  Left tibial hairline fracture.  DISCHARGE MEDICATIONS:  Tylenol 325 mg 2 tablets every 4 hours as needed, allopurinol 300 mg p.o. daily, bisacodyl 10 mg p.o. daily, bumetanide eye drops 1 drop each eye 2 times a day, Colace 100 mg p.o. b.i.d. fluoxetine 20 mg p.o. daily, furosemide 20 mg p.o. daily, lisinopril 10 mg p.o. daily, lovastatin 20 mg p.o., magnesium oxide 400 mg p.o. daily, multivitamin 5 mL p.o. daily, Norco 5/325, 2 tablets every 4 hours as needed. Please make sure not to exceed the Tylenol and acetaminophen more than 4 grams per day, so you have to balance Norco and acetaminophen.  Protonix 40 mg p.o. daily, Senokot about 8.6 mg p.o. b.i.d. as needed for constipation, Coumadin to be resumed once INR becomes less than 3; that  will be probably on Sunday. We will have PT-INR checked tomorrow and also Sunday. Make sure you resume Coumadin on Sunday, but not give it today and tomorrow, as INR is 3.5 today. Hold glyburide.  Do not give glyburide. The patient's p.o. intake is not stable. Instead cover with insulin sliding scale coverage back to goal. If less than 100, no coverage. For 100 to 150, 2 units. For 151 to 200, give 4 units of regular insulin. If glucose is 201 to 250, give 6 units, and 301 to 350 give 10 units, more 350, call the M.D.  CONSULTATIONS:  None.    DIET: Low-sodium, low-fat, ADA diet.   HOSPITAL COURSE: This is a 74 year old male patient with recent hospitalization significant for DVT in the right arm with a history of HIT, and he also had pneumonia,  left tibial hairline fracture, who was just discharged 2 days ago to [facility], brought in because of hypoglycemia. The patient was discharged to [facility] on  September 8th   Dictation ended here.  Please see addendum for continuation of dictation.     ____________________________ Katha HammingSnehalatha Maria Gallicchio, MD sk:dmm D: 02/11/2013 11:53:00 ET T: 02/11/2013 22:09:47 ET JOB#: [id number]  cc: Katha HammingSnehalatha Knox Holdman, MD, <Dictator> Katha HammingSNEHALATHA Devanie Galanti MD ELECTRONICALLY SIGNED 02/28/2013 5:02

## 2014-09-22 NOTE — H&P (Signed)
PATIENT NAME:  Noah Bush, [place] MR#:  [id number] DATE OF BIRTH:  1940/12/21  DATE OF ADMISSION:  04/03/2013  PRIMARY CARE PHYSICIAN: Dr. Barry BrunnerGlenn Willett   REFERRING PHYSICIAN:  Blinda LeatherwoodSamantha Bullard, PA   CHIEF COMPLAINT:  Hypoglycemia.    HISTORY OF PRESENT ILLNESS: Noah Bush is a 74 year old African American male with past medical history of diabetes mellitus, on oral medication was recently admitted the second week of October for hypoglycemia. The patient was admitted on 03/05/2013 and was discharged on 03/11/2013 for hypoglycemia. The patient has had multiple admissions for this hypoglycemia. The patient has history of hypertension, hyperlipidemia, diabetes mellitus, on oral medication. When the patient was admitted on 03/05/2013, the patient required D10 drip, multiple D50 boluses with improvement of the blood sugars. At the time of the discharge on 03/11/2013 the patient was told not to take any hyperglycemic agents; however, the patient continues to take glimepiride . The patient came to the Emergency Department with the blood sugars the 20s. The patient was given multiple doses of D50 and was initiated on D10. The patient continued to have hypoglycemia. The patient was also in altered mental status and hypothermic with a temperature of 92.5. The patient was initiated on bear huggers with improvement of the template  97.4. After giving multiple doses of D50 and D10, the patient's mental status has improved.   PAST MEDICAL HISTORY: 1.  Diabetes mellitus with a recurrent hypoglycemia last hemoglobin A1c was 6.6.  2.  Hypertension.  3.  Obesity.  4.  Chronic renal disease.  5.  Gout.  6.  Chronic alcoholism.  7.  Heparin-induced thrombocytopenia.  8.  Right upper extremity deep vein thrombosis on Coumadin.  9.  Left tibial hairline fracture.    ALLERGIES: HEPARIN   PAST SURGICAL HISTORY: leg surgery.   HOME MEDICATIONS: 1.  Timolol ophthalmic drop to both eyes.  2.   40 mg once a day.  3.   Lovastatin 20 mg once a day.  4.  Lisinopril 10 mg once a day.  5.  Glimepiride 2 mg once a day.  6.  Lasix 20 mg once a day.  7.  Docusate sodium 100 mg 2 times a day.  8.  Diclofenac sodium 75 mg 2 times a day.  9.  Coumadin 2.5 mg once a day.  10.  Brimonidine ophthalmic drops 2 times a day.  11. Allopurinol 300 mg once a day.   SOCIAL HISTORY: Former smoker. History of heavy alcohol use in the past. He is married and lives with his wife, usually is bedridden, dependent on activities of daily living.   FAMILY HISTORY:  Negative for coronary artery disease, significant for cancer in his grandfather.   REVIEW OF SYSTEMS: Unable to obtain from the patient secondary to confusion.   PHYSICAL EXAMINATION: GENERAL: This is a well-built, well-nourished, age-appropriate male lying down in the bed, not in distress.  VITAL SIGNS: Temperature 97, pulse 74, blood pressure 98/60, respiratory rate of 16, oxygen saturation is 94% on room air.  HEENT: Head normocephalic atraumatic. There is no scleral icterus. Conjunctivae normal. Pupils equal and react to light. Mucous membranes mild and there is no pharyngeal  erythema.  NECK: Supple. No lymphadenopathy. No JVD. No carotid bruit.  CHEST: Has no focal tenderness.  HEENT: Head normocephalic atraumatic. Asthma.  CHEST: No focal tenderness.  LUNGS: Bilaterally clear to auscultation.  HEART: S1 and S2 regular. No murmurs are heard,  no pedal edema. Pulses 2+.  ABDOMEN: Bowel sounds present. Soft, nontender,  nondistended. No hepatosplenomegaly.  SKIN: No rash or lesions,  dry skin.  MUSCULOSKELETAL: Good range of motion in all the extremities.   NEUROLOGIC:  The patient alert, oriented to self and place, but could not tell the time.  Motor 5/5 in upper and lower extremities.   LABORATORY DATA: Magnesium 1.3, BUN 24, creatinine of 2.7. The rest of all the values are within normal limits. CBC: WBC of 11.7, hemoglobin 8.6, platelet count of 514. ABG is  completely within normal limits.   CT head without contrast: No acute intracranial abnormality   Drug screen is negative. Urinalysis is negative for nitrites and leukocyte esterase.   IMPRESSION:  Noah Bush is a 74 year old male who comes to the Emergency Department with hypoglycemia, recurrent episodes.   1.  Hypoglycemia. The patient was discharged home without antihyperglycemics agents. However, the patient continues to take the glimepiride. We will hold the glimepiride.  Admit the patient to the monitored bed. Continue with D10, continue with Accu-Cheks every 2 hours. The patient's blood sugars continued to trend down to 40s. We leave need further counseling with the family members. There was also concern about decreased oral intake during the last admission and was started on Megace. Despite this, the patient developed severe hypoglycemia.   2. Hypertension currently well controlled. Continue with the home medications.  We will hold lisinopril for now.  3. Chronic renal insufficiency. Hold diclofenac. Continue with IV fluids and follow up.  4.  Hypothermia secondary to hypoglycemia,  improved with bear huggers.   5.  Debility.  The patient has been bedridden for a long time.  However, we will involve physical therapy.   6.  History of deep vein thrombosis. Continue with the Coumadin. The patient has history of heparin-induced thrombocytopenia.   TIME SPENT: 55 minutes     ____________________________ Susa Griffins, MD pv:cc D: 04/03/2013 23:54:07 ET T: 04/04/2013 00:36:59 ET JOB#: [id number]  cc: Susa Griffins, MD, <Dictator> Jorje Guild. Beckey Downing, MD Susa Griffins MD ELECTRONICALLY SIGNED 04/30/2013 20:59

## 2014-09-22 NOTE — Discharge Summary (Signed)
PATIENT NAME:  Noah RilingOTEAT, Noah Bush MR#:  [id number] DATE OF BIRTH:  Jul 12, 1940  DATE OF ADMISSION:  03/05/2013 DATE OF DISCHARGE:  03/11/2013  DISCHARGE DIAGNOSES:  1.  Metabolic encephalopathy, altered mental status due to hypoglycemia.  2.  Hypoglycemia due to diabetic medication.  3.  Chronic kidney disease.  4.  Chronic anemia.  5.  Deep vein thrombosis on Coumadin.   CONDITION ON DISCHARGE: Stable.   CODE STATUS: FULL CODE.   DISCHARGE MEDICATIONS: 1.  Allopurinol 300 mg oral tablet once a day.  2.  Bisacodyl 10 mg rectal suppository once a day as needed.  3.  Docusate sodium 100 mg 2 times a day.  4.  Fluoxetine 20 mg oral capsule once a day.  5.  Furosemide 20 mg once a day.  6.  Lovastatin 20 mg once a day.  7.  Magnesium oxide 400 mg once a day.  8.  Multivitamin 5 mL once a day.  9.  Pantoprazole 40 mg delayed-release once a day.  10.  Senokot 8.6 mg oral tablet 2 times a day.  11.  Timolol ophthalmic solution 1 drop each eye 2 times a day.  12.  Coumadin 2.5 mg oral tablet once a day.  13.  Acetaminophen 325 mg oral 2 tablets every 4 hours as needed for pain or temperature.  14.  Tamsulosin 0.4 mg oral capsule once a day.  15.  Megestrol 40 mg oral tablet once a day.   DIET ON DISCHARGE: Low-sodium carbohydrate-controlled ADA diet.   DIET CONSISTENCY: Regular.   ACTIVITY: As tolerated.   DISCHARGE FOLLOWUP: Advised to follow within 1 to 2 weeks with PMD.   HISTORY OF PRESENT ILLNESS: Admitted on 4th October by Dr. Milagros LollSrikar Sudini. 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 Came to Emergency Room because he was confused. Blood sugar was 50. He was given glucagon 1 mg and D50 and he was still hypoglycemic so he was given another D50 and given orange juice, but his blood sugar was still remaining in 40s so he was admitted to hospital with IV D5 drip. Initially  with D5 also his blood sugars were running in lower 50s so he was switched to D10. He had a long hospital course of up to 6 days. Gradually we were tapering down his glucose infusion to D5 at 100 mL then cut down 40 mL and then we cut down to zero mL, and his blood sugar was remaining stable, more than 100, without any dextrose solution and so he was feeling fine. We are discharging him back to the nursing home.   OTHER MEDICAL ISSUES IN THIS HOSPITAL COURSE:  1.  Hypertension. Blood pressure was stable and we continued following.  2.  CKD. Clearance was more than 60 so basically he does not have any baseline kidney issues and kidney function is normal.  3.  Right arm DVT.  He was Coumadin. INR was 2.2 and remained stable. We continued Coumadin here.  4.  Anemia which was stable, guaiac was negative, so we discharged him on supplemental iron. 5.  Proximal left tibia fracture. Knee immobilizer was on, healing was well, so we are sending him back to rehab for further management of this issue.  6.  UTI with sepsis. This was present on admission, but resolved with antibiotic therapy and so we are discharging without antibiotics as he finished 6 days in hospital.   IMPORTANT  LABORATORY RESULTS IN THE HOSPITAL: Creatinine was 1.66, sodium 134, potassium 4.5. INR was 1.6. WBC 14.1, hemoglobin 7.9, platelet count 647. Blood sugar was running on the lower side in initial 2 days of hospital stay. Creatinine gradually improved and came up to 1.15 on the day before discharge and his hemoglobin was around 7.1, which was stable around that level. Stool guaiac was negative. INR was running around 2, therapeutic, so we continued Coumadin.   TOTAL TIME SPENT ON THIS DISCHARGE: 40 minutes. ____________________________ Hope Pigeon Elisabeth Pigeon, MD vgv:sb D: 03/11/2013 13:23:54 ET T: 03/11/2013 13:48:34 ET JOB#: [id number]  cc: Hope Pigeon. Elisabeth Pigeon, MD, <Dictator> Jorje Guild. Beckey Downing, MD Altamese Dilling  MD ELECTRONICALLY SIGNED 03/14/2013 23:07

## 2015-02-27 ENCOUNTER — Encounter: Payer: Self-pay | Admitting: *Deleted

## 2015-03-12 ENCOUNTER — Ambulatory Visit (INDEPENDENT_AMBULATORY_CARE_PROVIDER_SITE_OTHER): Payer: Medicare Other | Admitting: General Surgery

## 2015-03-12 ENCOUNTER — Encounter: Payer: Self-pay | Admitting: General Surgery

## 2015-03-12 VITALS — BP 130/74 | HR 76 | Resp 14 | Ht 71.0 in | Wt 292.0 lb

## 2015-03-12 DIAGNOSIS — Z1211 Encounter for screening for malignant neoplasm of colon: Secondary | ICD-10-CM

## 2015-03-12 MED ORDER — POLYETHYLENE GLYCOL 3350 17 GM/SCOOP PO POWD: ORAL | Status: DC

## 2015-03-12 NOTE — Patient Instructions (Addendum)
Colonoscopy A colonoscopy is an exam to look at the entire large intestine (colon). This exam can help find problems such as tumors, polyps, inflammation, and areas of bleeding. The exam takes about 1 hour.  LET YOUR HEALTH CARE PROVIDER KNOW ABOUT:   Any allergies you have.  All medicines you are taking, including vitamins, herbs, eye drops, creams, and over-the-counter medicines.  Previous problems you or members of your family have had with the use of anesthetics.  Any blood disorders you have.  Previous surgeries you have had.  Medical conditions you have. RISKS AND COMPLICATIONS  Generally, this is a safe procedure. However, as with any procedure, complications can occur. Possible complications include:  Bleeding.  Tearing or rupture of the colon wall.  Reaction to medicines given during the exam.  Infection (rare). BEFORE THE PROCEDURE   Ask your health care provider about changing or stopping your regular medicines.  You may be prescribed an oral bowel prep. This involves drinking a large amount of medicated liquid, starting the day before your procedure. The liquid will cause you to have multiple loose stools until your stool is almost clear or light green. This cleans out your colon in preparation for the procedure.  Do not eat or drink anything else once you have started the bowel prep, unless your health care provider tells you it is safe to do so.  Arrange for someone to drive you home after the procedure. PROCEDURE   You will be given medicine to help you relax (sedative).  You will lie on your side with your knees bent.  A long, flexible tube with a light and camera on the end (colonoscope) will be inserted through the rectum and into the colon. The camera sends video back to a computer screen as it moves through the colon. The colonoscope also releases carbon dioxide gas to inflate the colon. This helps your health care provider see the area better.  During  the exam, your health care provider may take a small tissue sample (biopsy) to be examined under a microscope if any abnormalities are found.  The exam is finished when the entire colon has been viewed. AFTER THE PROCEDURE   Do not drive for 24 hours after the exam.  You may have a small amount of blood in your stool.  You may pass moderate amounts of gas and have mild abdominal cramping or bloating. This is caused by the gas used to inflate your colon during the exam.  Ask when your test results will be ready and how you will get your results. Make sure you get your test results.   This information is not intended to replace advice given to you by your health care provider. Make sure you discuss any questions you have with your health care provider.   Document Released: 05/16/2000 Document Revised: 03/09/2013 Document Reviewed: 01/24/2013 Elsevier Interactive Patient Education 2016 [employer]  Patient has been scheduled for a colonoscopy on 05-09-15 at [facility].  

## 2015-03-12 NOTE — Progress Notes (Signed)
Patient ID: Noah Bush., male   DOB: 05/29/1941, 74 y.o.   MRN: [id number]  Chief Complaint  Patient presents with  . Other    colonoscopy    HPI [name]. is a 74 y.o. male here for a colonoscopy discussion. He states that he has not had one done before. He denies any GI problems and he uses the bathroom daily.      HPI  Past Medical History  Diagnosis Date  . Chronic kidney disease   . Hypertension   . Gout     Past Surgical History  Procedure Laterality Date  . Tibia fracture surgery Left     Family History  Problem Relation Age of Onset  . Diabetes Paternal Grandfather     Social History Social History  Substance Use Topics  . Smoking status: Never Smoker   . Smokeless tobacco: Never Used  . Alcohol Use: No    No Known Allergies  Current Outpatient Prescriptions  Medication Sig Dispense Refill  . allopurinol (ZYLOPRIM) 100 MG tablet TAKE ONE TABLET BY MOUTH EVERY DAY FOR FOR GOUT. STOP   4  . Cyanocobalamin (VITAMIN B12 PO) Take 1 tablet by mouth daily.    Marland Kitchen donepezil (ARICEPT) 5 MG tablet Take 5 mg by mouth daily.  4  . tamsulosin (FLOMAX) 0.4 MG CAPS capsule TAKE ONE CAPSULE BY MOUTH FOR BPH  5  . polyethylene glycol powder (GLYCOLAX/MIRALAX) powder 255 grams one bottle for colonoscopy prep 255 g 0   No current facility-administered medications for this visit.    Review of Systems Review of Systems  Constitutional: Negative.   Respiratory: Negative.   Cardiovascular: Negative.   Gastrointestinal: Negative.     Blood pressure 130/74, pulse 76, resp. rate 14, height  (1.803 m), weight 292 lb (132.45 kg).  Physical Exam Physical Exam  Constitutional: He is oriented to person, place, and time. He appears well-developed and well-nourished.  Eyes: Conjunctivae are normal. No scleral icterus.  Neck: Neck supple.  Cardiovascular: Normal rate, regular rhythm and normal heart sounds.   Pulmonary/Chest: Breath sounds normal.   Lymphadenopathy:    He has no cervical adenopathy.  Neurological: He is alert and oriented to person, place, and time.  Skin: Skin is warm and dry.  Psychiatric: He has a normal mood and affect.    Data Reviewed PCP notes.  Assessment    Candidate for screening colonoscopy.    Plan    The patient is very concerned about having a low sugar if he is not a solid diet. He was reassured that A 12 ounce Coca-Cola will prevent this during the prep.  Colonoscopy with possible biopsy/polypectomy prn: Information regarding the procedure, including its potential risks and complications (including but not limited to perforation of the bowel, which may require emergency surgery to repair, and bleeding) was verbally given to the patient. Educational information regarding lower intestinal endoscopy was given to the patient. Written instructions for how to complete the bowel prep using Miralax were provided. The importance of drinking ample fluids to avoid dehydration as a result of the prep emphasized.  Patient has been scheduled for a colonoscopy on 05-09-15 at [facility].     PCP/Ref: Dr Schuyler Amor, Merrily Pew 03/12/2015, 4:45 PM

## 2015-05-02 ENCOUNTER — Telehealth: Payer: Self-pay | Admitting: *Deleted

## 2015-05-02 NOTE — Telephone Encounter (Signed)
Patient was contacted today and confirms no medication changes since last office visit.   Instructions about what patient can have day of prep were reviewed with the patient again.  We will [facility].

## 2015-05-08 ENCOUNTER — Encounter: Payer: Self-pay | Admitting: *Deleted

## 2015-05-09 ENCOUNTER — Ambulatory Visit
Admission: RE | Admit: 2015-05-09 | Discharge: 2015-05-09 | Disposition: A | Discharge status: Home / Self Care | Payer: Medicare Other | Source: Ambulatory Visit | Attending: General Surgery | Admitting: General Surgery

## 2015-05-09 ENCOUNTER — Ambulatory Visit: Payer: Medicare Other | Admitting: Anesthesiology

## 2015-05-09 ENCOUNTER — Encounter: Payer: Self-pay | Admitting: Anesthesiology

## 2015-05-09 ENCOUNTER — Encounter
Admission: RE | Disposition: A | Discharge status: Home / Self Care | Payer: Self-pay | Source: Ambulatory Visit | Attending: General Surgery

## 2015-05-09 DIAGNOSIS — E1122 Type 2 diabetes mellitus with diabetic chronic kidney disease: Secondary | ICD-10-CM | POA: Insufficient documentation

## 2015-05-09 DIAGNOSIS — N189 Chronic kidney disease, unspecified: Secondary | ICD-10-CM | POA: Diagnosis not present

## 2015-05-09 DIAGNOSIS — Z79899 Other long term (current) drug therapy: Secondary | ICD-10-CM | POA: Diagnosis not present

## 2015-05-09 DIAGNOSIS — I129 Hypertensive chronic kidney disease with stage 1 through stage 4 chronic kidney disease, or unspecified chronic kidney disease: Secondary | ICD-10-CM | POA: Insufficient documentation

## 2015-05-09 DIAGNOSIS — M109 Gout, unspecified: Secondary | ICD-10-CM | POA: Diagnosis not present

## 2015-05-09 DIAGNOSIS — Z1211 Encounter for screening for malignant neoplasm of colon: Secondary | ICD-10-CM | POA: Diagnosis not present

## 2015-05-09 HISTORY — PX: COLONOSCOPY WITH PROPOFOL: SHX5780

## 2015-05-09 LAB — GLUCOSE, CAPILLARY: Glucose-Capillary: 107 mg/dL — ABNORMAL HIGH (ref 65–99)

## 2015-05-09 SURGERY — COLONOSCOPY WITH PROPOFOL
Anesthesia: General

## 2015-05-09 MED ORDER — FENTANYL CITRATE (PF) 100 MCG/2ML IJ SOLN
INTRAMUSCULAR | Status: DC | PRN
  Administered 2015-05-09: 50 ug via INTRAVENOUS

## 2015-05-09 MED ORDER — PHENYLEPHRINE HCL 10 MG/ML IJ SOLN
INTRAMUSCULAR | Status: DC | PRN
  Administered 2015-05-09: 100 ug via INTRAVENOUS

## 2015-05-09 MED ORDER — SODIUM CHLORIDE 0.9 % IV SOLN
INTRAVENOUS | Status: DC
  Administered 2015-05-09: 09:00:00 via INTRAVENOUS

## 2015-05-09 MED ORDER — MIDAZOLAM HCL 2 MG/2ML IJ SOLN
INTRAMUSCULAR | Status: DC | PRN
  Administered 2015-05-09: 1 mg via INTRAVENOUS

## 2015-05-09 MED ORDER — PROPOFOL 500 MG/50ML IV EMUL
INTRAVENOUS | Status: DC | PRN
  Administered 2015-05-09: 140 ug/kg/min via INTRAVENOUS

## 2015-05-09 MED ORDER — EPHEDRINE SULFATE 50 MG/ML IJ SOLN
INTRAMUSCULAR | Status: DC | PRN
  Administered 2015-05-09: 10 mg via INTRAVENOUS
  Administered 2015-05-09: 5 mg via INTRAVENOUS

## 2015-05-09 NOTE — Transfer of Care (Signed)
Immediate Anesthesia Transfer of Care Note  Patient: Noah RumpfJames Stubblefield Jr.  Procedure(s) Performed: Procedure(s): COLONOSCOPY WITH PROPOFOL (N/A)  Patient Location: PACU  Anesthesia Type:General  Level of Consciousness: awake and sedated  Airway & Oxygen Therapy: Patient Spontanous Breathing and Patient connected to face mask oxygen  Post-op Assessment: Report given to RN and Post -op Vital signs reviewed and stable  Post vital signs: Reviewed and stable  Last Vitals:  Filed Vitals:   05/09/15 0854  BP: 152/82  Pulse: 76  Temp: 36.7 C  Resp: 18    Complications: No apparent anesthesia complications

## 2015-05-09 NOTE — Op Note (Signed)
[facility] Gastroenterology Patient Name: Noah Bush Procedure Date: 05/09/2015 9:54 AM MRN: [id number] Account #: [id number] Date of Birth: 1940-08-23 Admit Type: Outpatient Age: 74 Room: [facility] ENDO ROOM 1 Gender: Male Note Status: Finalized Procedure:         Colonoscopy Indications:       Screening for colorectal malignant neoplasm Providers:         [name], MD Referring MD:      Serita Sheller. Maryellen Pile, MD (Referring MD) Medicines:         Monitored Anesthesia Care Complications:     No immediate complications. Procedure:         Pre-Anesthesia Assessment:                    - Prior to the procedure, a History and Physical was                     performed, and patient medications, allergies and                     sensitivities were reviewed. The patient's tolerance of                     previous anesthesia was reviewed.                    - The risks and benefits of the procedure and the sedation                     options and risks were discussed with the patient. All                     questions were answered and informed consent was obtained.                    After obtaining informed consent, the colonoscope was                     passed under direct vision. Throughout the procedure, the                     patient's blood pressure, pulse, and oxygen saturations                     were monitored continuously. The Colonoscope was                     introduced through the anus and advanced to the the cecum,                     identified by appendiceal orifice and ileocecal valve. The                     colonoscopy was performed without difficulty. The patient                     tolerated the procedure well. The quality of the bowel                     preparation was excellent. Findings:      The entire examined colon appeared normal on direct and retroflexion       views. Impression:        - The entire examined colon is normal on direct  and  retroflexion views.                    - No specimens collected. Recommendation:    - Discharge patient to home (via wheelchair). Procedure Code(s): --- Professional ---                    [phone] [id number], Colonoscopy, flexible; diagnostic, including                     collection of specimen(s) by brushing or washing, when                     performed (separate procedure) Diagnosis Code(s): --- Professional ---                    Z12.11, Encounter for screening for malignant neoplasm of                     colon CPT copyright 2014 American Medical Association. All rights reserved. The codes documented in this report are preliminary and upon coder review may  be revised to meet current compliance requirements. Earline MayotteJeffrey W. Lakyn Alsteen, MD 05/09/2015 10:22:29 AM This report has been signed electronically. Number of Addenda: 0 Note Initiated On: 05/09/2015 9:54 AM Scope Withdrawal Time: 0 hours 8 minutes 42 seconds  Total Procedure Duration: 0 hours 14 minutes 36 seconds       [facility]

## 2015-05-09 NOTE — H&P (Signed)
Noah RumpfJames Eggebrecht Jr. [id number] 11-13-1940     HPI: Candidate for screening colonoscopy. Tolerated prep well, with one episode of emesis.   Prescriptions prior to admission  Medication Sig Dispense Refill Last Dose  . allopurinol (ZYLOPRIM) 100 MG tablet TAKE ONE TABLET BY MOUTH EVERY DAY FOR FOR GOUT. STOP 300MG   4 Taking  . Cyanocobalamin (VITAMIN B12 PO) Take 1 tablet by mouth daily.   Taking  . donepezil (ARICEPT) 5 MG tablet Take 5 mg by mouth daily.  4 Taking  . polyethylene glycol powder (GLYCOLAX/MIRALAX) powder 255 grams one bottle for colonoscopy prep 255 g 0   . tamsulosin (FLOMAX) 0.4 MG CAPS capsule TAKE ONE CAPSULE BY MOUTH FOR BPH  5 Taking   No Known Allergies Past Medical History  Diagnosis Date  . Chronic kidney disease   . Hypertension   . Gout    Past Surgical History  Procedure Laterality Date  . Tibia fracture surgery Left    Social History   Social History  . Marital Status: Married    Spouse Name: N/A  . Number of Children: N/A  . Years of Education: N/A   Occupational History  . Not on file.   Social History Main Topics  . Smoking status: Never Smoker   . Smokeless tobacco: Never Used  . Alcohol Use: No  . Drug Use: No  . Sexual Activity: Not on file   Other Topics Concern  . Not on file   Social History Narrative   Social History   Social History Narrative     ROS: Negative.     PE: HEENT: Negative. Lungs: Clear. Cardio: RR. Noah Bush, Consepcion Bush W 05/09/2015   Assessment/Plan:  Proceed with planned endoscopy.

## 2015-05-09 NOTE — Anesthesia Preprocedure Evaluation (Addendum)
Anesthesia Evaluation  Patient identified by MRN, date of birth, ID band Patient awake    Reviewed: Allergy & Precautions, H&P , NPO status , Patient's Chart, lab work & pertinent test results  History of Anesthesia Complications Negative for: history of anesthetic complications  Airway Mallampati: III  TM Distance: >3 FB Neck ROM: full    Dental  (+) Poor Dentition, Chipped   Pulmonary neg pulmonary ROS, neg shortness of breath,    Pulmonary exam normal breath sounds clear to auscultation       Cardiovascular Exercise Tolerance: Good hypertension, (-) angina(-) Past MI and (-) DOE Normal cardiovascular exam Rhythm:regular Rate:Normal     Neuro/Psych negative neurological ROS  negative psych ROS   GI/Hepatic negative GI ROS, Neg liver ROS,   Endo/Other  diabetes, Type 2  Renal/GU CRFRenal disease  negative genitourinary   Musculoskeletal   Abdominal   Peds  Hematology negative hematology ROS (+)   Anesthesia Other Findings Past Medical History:   Chronic kidney disease                                       Hypertension                                                 Gout                                                        Past Surgical History:   TIBIA FRACTURE SURGERY                          Left             Signs and symptoms suggestive of sleep apnea     Reproductive/Obstetrics negative OB ROS                            Anesthesia Physical Anesthesia Plan  ASA: III  Anesthesia Plan: General   Post-op Pain Management:    Induction:   Airway Management Planned:   Additional Equipment:   Intra-op Plan:   Post-operative Plan:   Informed Consent: I have reviewed the patients History and Physical, chart, labs and discussed the procedure including the risks, benefits and alternatives for the proposed anesthesia with the patient or authorized representative who has  indicated his/her understanding and acceptance.   Dental Advisory Given  Plan Discussed with: Anesthesiologist, CRNA and Surgeon  Anesthesia Plan Comments:         Anesthesia Quick Evaluation

## 2015-05-09 NOTE — Anesthesia Postprocedure Evaluation (Signed)
Anesthesia Post Note  Patient: Noah RumpfJames Hillier Jr.  Procedure(s) Performed: Procedure(s) (LRB): COLONOSCOPY WITH PROPOFOL (N/A)  Patient location during evaluation: Endoscopy Anesthesia Type: General Level of consciousness: awake and alert Pain management: pain level controlled Vital Signs Assessment: post-procedure vital signs reviewed and stable Respiratory status: spontaneous breathing, nonlabored ventilation, respiratory function stable and patient connected to nasal cannula oxygen Cardiovascular status: blood pressure returned to baseline and stable Postop Assessment: no signs of nausea or vomiting Anesthetic complications: no    Last Vitals:  Filed Vitals:   05/09/15 1040 05/09/15 1050  BP: 117/100 134/102  Pulse: 70 57  Temp:    Resp: 13 15    Last Pain: There were no vitals filed for this visit.               Cleda MccreedyJoseph K Kenlea Woodell

## 2015-05-09 NOTE — Anesthesia Procedure Notes (Signed)
Performed by: COOK-MARTIN, Danyal Whitenack Pre-anesthesia Checklist: Patient identified, Emergency Drugs available, Suction available, Patient being monitored and Timeout performed Patient Re-evaluated:Patient Re-evaluated prior to inductionOxygen Delivery Method: Simple face mask Preoxygenation: Pre-oxygenation with 100% oxygen Intubation Type: IV induction Placement Confirmation: CO2 detector and positive ETCO2       

## 2015-12-18 ENCOUNTER — Emergency Department
Admission: EM | Admit: 2015-12-18 | Discharge: 2015-12-18 | Disposition: A | Discharge status: Home / Self Care | Payer: Medicare Other | Attending: Emergency Medicine | Admitting: Emergency Medicine

## 2015-12-18 DIAGNOSIS — Z79899 Other long term (current) drug therapy: Secondary | ICD-10-CM | POA: Diagnosis not present

## 2015-12-18 DIAGNOSIS — I129 Hypertensive chronic kidney disease with stage 1 through stage 4 chronic kidney disease, or unspecified chronic kidney disease: Secondary | ICD-10-CM | POA: Insufficient documentation

## 2015-12-18 DIAGNOSIS — H6121 Impacted cerumen, right ear: Secondary | ICD-10-CM | POA: Insufficient documentation

## 2015-12-18 DIAGNOSIS — N189 Chronic kidney disease, unspecified: Secondary | ICD-10-CM | POA: Diagnosis not present

## 2015-12-18 NOTE — Discharge Instructions (Signed)
Cerumen Impaction The structures of the external ear canal secrete a waxy substance known as cerumen. Excess cerumen can build up in the ear canal, causing a condition known as cerumen impaction. Cerumen impaction can cause ear pain and disrupt the function of the ear. The rate of cerumen production differs for each individual. In certain individuals, the configuration of the ear canal may decrease his or her ability to naturally remove cerumen. CAUSES Cerumen impaction is caused by excessive cerumen production or buildup. RISK FACTORS  Frequent use of swabs to clean ears.  Having narrow ear canals.  Having eczema.  Being dehydrated. SIGNS AND SYMPTOMS  Diminished hearing.  Ear drainage.  Ear pain.  Ear itch. TREATMENT Treatment may involve:  Over-the-counter or prescription ear drops to soften the cerumen.  Removal of cerumen by a health care provider. This may be done with:  Irrigation with warm water. This is the most common method of removal.  Ear curettes and other instruments.  Surgery. This may be done in severe cases. HOME CARE INSTRUCTIONS  Take medicines only as directed by your health care provider.  Do not insert objects into the ear with the intent of cleaning the ear. PREVENTION  Do not insert objects into the ear, even with the intent of cleaning the ear. Removing cerumen as a part of normal hygiene is not necessary, and the use of swabs in the ear canal is not recommended.  Drink enough water to keep your urine clear or pale yellow.  Control your eczema if you have it. SEEK MEDICAL CARE IF:  You develop ear pain.  You develop bleeding from the ear.  The cerumen does not clear after you use ear drops as directed.   This information is not intended to replace advice given to you by your health care provider. Make sure you discuss any questions you have with your health care provider.   Document Released: 06/26/2004 Document Revised: 06/09/2014  Document Reviewed: 01/03/2015 Elsevier Interactive Patient Education 2016 [employer]  

## 2015-12-18 NOTE — ED Notes (Signed)
Pt reports "something" in left ear x2-3 weeks. States "somethings scratching around in there".

## 2015-12-18 NOTE — ED Provider Notes (Signed)
[facility] Emergency Department Provider Note   ____________________________________________  Time seen: Approximately 8:29 AM  I have reviewed the triage vital signs and the nursing notes.   HISTORY  Chief Complaint Foreign Body in Ear    HPI Noah Bush. is a 75 y.o. male who presents today for evaluation of a foreign body in his ear. 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 Patient states that "it" sleeps during the day and wakes up at night and itches his ear. Denies any symptoms presently.   Past Medical History  Diagnosis Date  . Chronic kidney disease   . Hypertension   . Gout     Patient Active Problem List   Diagnosis Date Noted  . Encounter for screening colonoscopy 03/12/2015    Past Surgical History  Procedure Laterality Date  . Tibia fracture surgery Left   . Colonoscopy with propofol N/A 05/09/2015    Procedure: COLONOSCOPY WITH PROPOFOL;  Surgeon: [name], MD;  Location: [facility] ENDOSCOPY;  Service: Endoscopy;  Laterality: N/A;    Current Outpatient Rx  Name  Route  Sig  Dispense  Refill  . allopurinol (ZYLOPRIM) 100 MG tablet      TAKE ONE TABLET BY MOUTH EVERY DAY FOR FOR GOUT. STOP       4   . Cyanocobalamin (VITAMIN B12 PO)   Oral   Take 1 tablet by mouth daily.         Marland Kitchen donepezil (ARICEPT) 5 MG tablet   Oral   Take 5 mg by mouth daily.      4   . polyethylene glycol powder (GLYCOLAX/MIRALAX) powder      255 grams one bottle for colonoscopy prep   255 g   0   . tamsulosin (FLOMAX) 0.4 MG CAPS capsule      TAKE ONE CAPSULE BY MOUTH FOR BPH      5     Allergies Review of patient's allergies indicates no known allergies.  Family History  Problem Relation Age of Onset  . Diabetes Paternal Grandfather     Social History Social History  Substance Use Topics  . Smoking status: Never Smoker   .  Smokeless tobacco: Never Used  . Alcohol Use: No    Review of Systems Constitutional: No fever/chills Eyes: No visual changes. ENT: Patient feels like there is something crawling around in his ear. It itches and mainly causes problems at night. Not currently having symptoms Cardiovascular: Denies chest pain. Respiratory: Denies shortness of breath. Gastrointestinal: No abdominal pain.  No nausea, no vomiting.  No diarrhea.  No constipation. Genitourinary: Negative for dysuria. Musculoskeletal: Negative for back pain. Skin: Negative for rash. Neurological: Negative for headaches, focal weakness or numbness.  10-point ROS otherwise negative.  ____________________________________________   PHYSICAL EXAM:  VITAL SIGNS: ED Triage Vitals  Enc Vitals Group     BP 12/18/15 0811 179/92 mmHg     Pulse Rate 12/18/15 0811 72     Resp 12/18/15 0811 16     Temp 12/18/15 0811 97.6 F (36.4 C)     Temp Source 12/18/15 0811 Oral     SpO2 12/18/15 0811 97 %     Weight 12/18/15 0801 300 lb (136.079 kg)     Height 12/18/15 0801 6' (1.829 m)     Head Cir --      Peak Flow --  Pain Score --      Pain Loc --      Pain Edu? --      Excl. in GC? --     Constitutional: Alert and oriented. Well appearing and in no acute distress. Head: Atraumatic. Ears/Nose: No congestion/rhinnorhea. Patient has a cerumen impacted right ear affecting his hearing. Left ear no forgein body was visualized. The ear canal had minimal wax buildup without any excoriations Mouth/Throat: Mucous membranes are moist.  Oropharynx non-erythematous. Cardiovascular: Normal rate, regular rhythm. Grossly normal heart sounds.  Good peripheral circulation. Respiratory: Normal respiratory effort.  No retractions. Lungs CTAB. Musculoskeletal: No lower extremity tenderness nor edema.  No joint effusions. Neurologic:  Normal speech and language. No gross focal neurologic deficits are appreciated. No gait instability. Skin:   Skin is warm, dry and intact. No rash noted. Psychiatric: Mood and affect are normal. Speech and behavior are normal.  ____________________________________________   LABS (all labs ordered are listed, but only abnormal results are displayed)  Labs Reviewed - No data to display ____________________________________________  EKG  Not indicated ____________________________________________  RADIOLOGY Not indicated ____________________________________________   PROCEDURES  Procedure(s) performed: None  Procedures  Critical Care performed: No  ____________________________________________   INITIAL IMPRESSION / ASSESSMENT AND PLAN / ED COURSE  Pertinent labs & imaging results that were available during my care of the patient were reviewed by me and considered in my medical decision making (see chart for details).  Patient has right ear impacted with cerumen which was lavaged. Left ear has no present foreign body present currently. Patient was given reassurance and told to follow up with PCP if problems persist. Patient is agreeable. ____________________________________________   FINAL CLINICAL IMPRESSION(S) / ED DIAGNOSES  Final diagnoses:  Cerumen impaction, right      NEW MEDICATIONS STARTED DURING THIS VISIT:  Discharge Medication List as of 12/18/2015  8:39 AM       Note:  This document was prepared using Dragon voice recognition software and may include unintentional dictation errors.   Evangeline Dakinharles M Terrin Meddaugh, PA-C 12/18/15 [id number]  Charlynne Panderavid Hsienta Yao, MD 12/18/15 [phone]

## 2017-06-29 ENCOUNTER — Emergency Department
Admission: EM | Admit: 2017-06-29 | Discharge: 2017-06-30 | Disposition: A | Discharge status: Home / Self Care | Payer: Medicare Other | Attending: Emergency Medicine | Admitting: Emergency Medicine

## 2017-06-29 ENCOUNTER — Emergency Department: Payer: Medicare Other

## 2017-06-29 ENCOUNTER — Encounter: Payer: Self-pay | Admitting: Emergency Medicine

## 2017-06-29 DIAGNOSIS — Z79899 Other long term (current) drug therapy: Secondary | ICD-10-CM | POA: Diagnosis not present

## 2017-06-29 DIAGNOSIS — I10 Essential (primary) hypertension: Secondary | ICD-10-CM | POA: Insufficient documentation

## 2017-06-29 DIAGNOSIS — R1084 Generalized abdominal pain: Secondary | ICD-10-CM | POA: Insufficient documentation

## 2017-06-29 DIAGNOSIS — R109 Unspecified abdominal pain: Secondary | ICD-10-CM

## 2017-06-29 LAB — CBC
HCT: 48.2 % (ref 40.0–52.0)
Hemoglobin: 15.6 g/dL (ref 13.0–18.0)
MCH: 27.9 pg (ref 26.0–34.0)
MCHC: 32.4 g/dL (ref 32.0–36.0)
MCV: 86.2 fL (ref 80.0–100.0)
PLATELETS: 258 10*3/uL (ref 150–440)
RBC: 5.59 MIL/uL (ref 4.40–5.90)
RDW: 15.5 % — ABNORMAL HIGH (ref 11.5–14.5)
WBC: 7.4 10*3/uL (ref 3.8–10.6)

## 2017-06-29 LAB — URINALYSIS, COMPLETE (UACMP) WITH MICROSCOPIC
Bacteria, UA: NONE SEEN
Bilirubin Urine: NEGATIVE
Glucose, UA: NEGATIVE mg/dL
Hgb urine dipstick: NEGATIVE
KETONES UR: NEGATIVE mg/dL
Nitrite: NEGATIVE
Protein, ur: NEGATIVE mg/dL
SQUAMOUS EPITHELIAL / LPF: NONE SEEN
Specific Gravity, Urine: 1.043 — ABNORMAL HIGH (ref 1.005–1.030)
pH: 5 (ref 5.0–8.0)

## 2017-06-29 LAB — COMPREHENSIVE METABOLIC PANEL
ALBUMIN: 4.5 g/dL (ref 3.5–5.0)
ALT: 11 U/L — AB (ref 17–63)
AST: 25 U/L (ref 15–41)
Alkaline Phosphatase: 57 U/L (ref 38–126)
Anion gap: 12 (ref 5–15)
BUN: 27 mg/dL — ABNORMAL HIGH (ref 6–20)
CHLORIDE: 103 mmol/L (ref 101–111)
CO2: 21 mmol/L — AB (ref 22–32)
CREATININE: 1.62 mg/dL — AB (ref 0.61–1.24)
Calcium: 9.5 mg/dL (ref 8.9–10.3)
GFR calc non Af Amer: 40 mL/min — ABNORMAL LOW (ref >60)
GFR, EST AFRICAN AMERICAN: 46 mL/min — AB (ref >60)
GLUCOSE: 120 mg/dL — AB (ref 65–99)
Potassium: 5.1 mmol/L (ref 3.5–5.1)
SODIUM: 136 mmol/L (ref 135–145)
Total Bilirubin: 0.6 mg/dL (ref 0.3–1.2)
Total Protein: 8.5 g/dL — ABNORMAL HIGH (ref 6.5–8.1)

## 2017-06-29 LAB — LIPASE, BLOOD: LIPASE: 33 U/L (ref 11–51)

## 2017-06-29 MED ORDER — IOPAMIDOL (ISOVUE-300) INJECTION 61%: 30.0000 mL | Freq: Once | INTRAVENOUS | Status: DC | PRN

## 2017-06-29 MED ORDER — IOPAMIDOL (ISOVUE-300) INJECTION 61%
100.0000 mL | Freq: Once | INTRAVENOUS | Status: AC | PRN
  Administered 2017-06-29: 100 mL via INTRAVENOUS

## 2017-06-29 MED ORDER — DICYCLOMINE HCL 10 MG PO CAPS
10.0000 mg | ORAL_CAPSULE | Freq: Once | ORAL | Status: AC
  Administered 2017-06-30: 10 mg via ORAL
  Filled 2017-06-29: qty 1

## 2017-06-29 NOTE — ED Provider Notes (Signed)
[facility] Emergency Department Provider Note   ____________________________________________   I have reviewed the triage vital signs and the nursing notes.   HISTORY  Chief Complaint Abdominal Pain   History limited by: Not Limited   HPI Noah Lassen. is a 77 y.o. male who presents to the emergency department today because of concerns for abdominal cramping and pain.  Is located in the lower abdomen.  Is been going on for the past 2 weeks.  It is intermittent.  It comes and goes.  Seems to be worse sometimes in certain positions and when he is straining to have a bowel movement.  Patient has not had any significant diarrhea.  No bloody stool.  He denies any associated nausea or vomiting.  Denies similar symptoms in the past.  No history of abdominal surgeries.  No fevers.   Per medical record review patient has a history of CKD, HTN.  Past Medical History:  Diagnosis Date  . Chronic kidney disease   . Gout   . Hypertension     Patient Active Problem List   Diagnosis Date Noted  . Encounter for screening colonoscopy 03/12/2015    Past Surgical History:  Procedure Laterality Date  . COLONOSCOPY WITH PROPOFOL N/A 05/09/2015   Procedure: COLONOSCOPY WITH PROPOFOL;  Surgeon: [name], MD;  Location: [facility] ENDOSCOPY;  Service: Endoscopy;  Laterality: N/A;  . TIBIA FRACTURE SURGERY Left     Prior to Admission medications   Medication Sig Start Date End Date Taking? Authorizing Provider  Cyanocobalamin (VITAMIN B12 PO) Take 1 tablet by mouth daily.   Yes [provider]  donepezil (ARICEPT) 5 MG tablet Take 5 mg by mouth daily. 02/19/15  Yes [provider]  allopurinol (ZYLOPRIM) 100 MG tablet TAKE ONE TABLET BY MOUTH EVERY DAY FOR FOR GOUT. STOP 300MG  02/19/15   [provider]  polyethylene glycol powder (GLYCOLAX/MIRALAX) powder 255 grams one bottle for colonoscopy prep Patient not taking: Reported on 06/29/2017  03/12/15   [name], MD  tamsulosin (FLOMAX) 0.4 MG CAPS capsule TAKE ONE CAPSULE BY MOUTH FOR BPH 02/19/15   [provider]    Allergies Patient has no known allergies.  Family History  Problem Relation Age of Onset  . Diabetes Paternal Grandfather     Social History Social History   Tobacco Use  . Smoking status: Never Smoker  . Smokeless tobacco: Never Used  Substance Use Topics  . Alcohol use: No    Alcohol/week: 0.0 oz  . Drug use: No    Review of Systems Constitutional: No fever/chills Eyes: No visual changes. ENT: No sore throat. Cardiovascular: Denies chest pain. Respiratory: Denies shortness of breath. Gastrointestinal: Positive for lower abdominal cramping. Genitourinary: Negative for dysuria. Musculoskeletal: Negative for back pain. Skin: Negative for rash. Neurological: Negative for headaches, focal weakness or numbness.  ____________________________________________   PHYSICAL EXAM:  VITAL SIGNS: ED Triage Vitals  Enc Vitals Group     BP 06/29/17 1558 (!) 164/76     Pulse Rate 06/29/17 1558 78     Resp 06/29/17 1558 18     Temp 06/29/17 1558 97.6 F (36.4 C)     Temp Source 06/29/17 1558 Oral     SpO2 06/29/17 1558 100 %     Weight 06/29/17 1559 274 lb (124.3 kg)     Height 06/29/17 1559 5\' 11"  (1.803 m)     Head Circumference --      Peak Flow --  Pain Score 06/29/17 1558 0   Constitutional: Alert and oriented. Well appearing and in no distress. Eyes: Conjunctivae are normal.  ENT   Head: Normocephalic and atraumatic.   Nose: No congestion/rhinnorhea.   Mouth/Throat: Mucous membranes are moist.   Neck: No stridor. Hematological/Lymphatic/Immunilogical: No cervical lymphadenopathy. Cardiovascular: Normal rate, regular rhythm.  No murmurs, rubs, or gallops.  Respiratory: Normal respiratory effort without tachypnea nor retractions. Breath sounds are clear and equal bilaterally. No  wheezes/rales/rhonchi. Gastrointestinal: Soft and non tender. No rebound. No guarding.  Genitourinary: Deferred Musculoskeletal: Normal range of motion in all extremities. No lower extremity edema. Neurologic:  Normal speech and language. No gross focal neurologic deficits are appreciated.  Skin:  Skin is warm, dry and intact. No rash noted. Psychiatric: Mood and affect are normal. Speech and behavior are normal. Patient exhibits appropriate insight and judgment.  ____________________________________________    LABS (pertinent positives/negatives)  Lipase 33 CBC wbc 7.4, hgb 15.6, plt 258 CMP cr 1.62, glu 120  ____________________________________________   EKG  None  ____________________________________________    RADIOLOGY  CT abd pelvis No acute process, diverticulosis, gallstones.  ____________________________________________   PROCEDURES  Procedures  ____________________________________________   INITIAL IMPRESSION / ASSESSMENT AND PLAN / ED COURSE  Pertinent labs & imaging results that were available during my care of the patient were reviewed by me and considered in my medical decision making (see chart for details).  Patient presented to the emergency department today because of concerns for intermittent lower abdominal differential would be broad including diverticulitis, hernias, kidney stones, urinary tract infection, colitis amongst other etiologies.  Blood work urine and CT scan without any obvious etiology of the patient's discomfort.  Certainly his pain is out of the upper abdomen so I doubt the gallstones were the issue.  At this point feel patient does not require any further emergent imaging or workup.  Will give the patient Bentyl and will give prescription for trial of Bentyl.  Will give patient primary care follow-up information.  ____________________________________________   FINAL CLINICAL IMPRESSION(S) / ED DIAGNOSES  Final diagnoses:   Abdominal cramping     Note: This dictation was prepared with Dragon dictation. Any transcriptional errors that result from this process are unintentional     Noah Bush, Noah Arrasmith, MD 06/29/17 2359

## 2017-06-29 NOTE — Discharge Instructions (Signed)
Please seek medical attention for any high fevers, chest pain, shortness of breath, change in behavior, persistent vomiting, bloody stool or any other new or concerning symptoms.  

## 2017-06-29 NOTE — ED Triage Notes (Signed)
FIRST NURSE NOTE-[facility]. Seen at urgent care last week. Unlabored.  Sat 98 at check in

## 2017-06-29 NOTE — ED Triage Notes (Signed)
Pt in via POV; [facility]; pt reports lower abdominal cramps x approximately two weeks w/ some diarrhea, pt denies any N/V.  Vitals WDL, NAD noted at this time.

## 2017-07-23 ENCOUNTER — Encounter (HOSPITAL_COMMUNITY): Payer: Self-pay | Admitting: *Deleted

## 2017-07-23 ENCOUNTER — Emergency Department (HOSPITAL_COMMUNITY): Payer: Medicare Other

## 2017-07-23 ENCOUNTER — Inpatient Hospital Stay (HOSPITAL_COMMUNITY)
Admission: EM | Admit: 2017-07-23 | Discharge: 2017-07-28 | DRG: 064 | Disposition: A | Discharge status: Rehabilitation Facility | Payer: Medicare Other | Attending: Family Medicine | Admitting: Family Medicine

## 2017-07-23 ENCOUNTER — Other Ambulatory Visit: Payer: Self-pay

## 2017-07-23 DIAGNOSIS — D62 Acute posthemorrhagic anemia: Secondary | ICD-10-CM | POA: Diagnosis not present

## 2017-07-23 DIAGNOSIS — G309 Alzheimer's disease, unspecified: Secondary | ICD-10-CM | POA: Diagnosis present

## 2017-07-23 DIAGNOSIS — I63511 Cerebral infarction due to unspecified occlusion or stenosis of right middle cerebral artery: Secondary | ICD-10-CM | POA: Diagnosis not present

## 2017-07-23 DIAGNOSIS — R5383 Other fatigue: Secondary | ICD-10-CM | POA: Diagnosis not present

## 2017-07-23 DIAGNOSIS — R402142 Coma scale, eyes open, spontaneous, at arrival to emergency department: Secondary | ICD-10-CM | POA: Diagnosis present

## 2017-07-23 DIAGNOSIS — R402242 Coma scale, best verbal response, confused conversation, at arrival to emergency department: Secondary | ICD-10-CM | POA: Diagnosis present

## 2017-07-23 DIAGNOSIS — N183 Chronic kidney disease, stage 3 unspecified: Secondary | ICD-10-CM

## 2017-07-23 DIAGNOSIS — R402362 Coma scale, best motor response, obeys commands, at arrival to emergency department: Secondary | ICD-10-CM | POA: Diagnosis present

## 2017-07-23 DIAGNOSIS — Z79899 Other long term (current) drug therapy: Secondary | ICD-10-CM | POA: Diagnosis not present

## 2017-07-23 DIAGNOSIS — F0150 Vascular dementia without behavioral disturbance: Secondary | ICD-10-CM | POA: Diagnosis present

## 2017-07-23 DIAGNOSIS — E875 Hyperkalemia: Secondary | ICD-10-CM | POA: Diagnosis present

## 2017-07-23 DIAGNOSIS — R339 Retention of urine, unspecified: Secondary | ICD-10-CM | POA: Diagnosis present

## 2017-07-23 DIAGNOSIS — E785 Hyperlipidemia, unspecified: Secondary | ICD-10-CM | POA: Diagnosis present

## 2017-07-23 DIAGNOSIS — I69392 Facial weakness following cerebral infarction: Secondary | ICD-10-CM

## 2017-07-23 DIAGNOSIS — H9190 Unspecified hearing loss, unspecified ear: Secondary | ICD-10-CM | POA: Diagnosis present

## 2017-07-23 DIAGNOSIS — Z6838 Body mass index (BMI) 38.0-38.9, adult: Secondary | ICD-10-CM

## 2017-07-23 DIAGNOSIS — R29708 NIHSS score 8: Secondary | ICD-10-CM | POA: Diagnosis present

## 2017-07-23 DIAGNOSIS — R748 Abnormal levels of other serum enzymes: Secondary | ICD-10-CM | POA: Diagnosis present

## 2017-07-23 DIAGNOSIS — G936 Cerebral edema: Secondary | ICD-10-CM | POA: Diagnosis present

## 2017-07-23 DIAGNOSIS — G935 Compression of brain: Secondary | ICD-10-CM | POA: Diagnosis present

## 2017-07-23 DIAGNOSIS — E669 Obesity, unspecified: Secondary | ICD-10-CM | POA: Diagnosis present

## 2017-07-23 DIAGNOSIS — Q048 Other specified congenital malformations of brain: Secondary | ICD-10-CM

## 2017-07-23 DIAGNOSIS — I639 Cerebral infarction, unspecified: Secondary | ICD-10-CM

## 2017-07-23 DIAGNOSIS — R7989 Other specified abnormal findings of blood chemistry: Secondary | ICD-10-CM | POA: Diagnosis not present

## 2017-07-23 DIAGNOSIS — I63311 Cerebral infarction due to thrombosis of right middle cerebral artery: Secondary | ICD-10-CM | POA: Diagnosis not present

## 2017-07-23 DIAGNOSIS — I10 Essential (primary) hypertension: Secondary | ICD-10-CM | POA: Diagnosis present

## 2017-07-23 DIAGNOSIS — F0280 Dementia in other diseases classified elsewhere without behavioral disturbance: Secondary | ICD-10-CM | POA: Diagnosis not present

## 2017-07-23 DIAGNOSIS — M109 Gout, unspecified: Secondary | ICD-10-CM | POA: Diagnosis present

## 2017-07-23 DIAGNOSIS — E639 Nutritional deficiency, unspecified: Secondary | ICD-10-CM | POA: Diagnosis not present

## 2017-07-23 DIAGNOSIS — I129 Hypertensive chronic kidney disease with stage 1 through stage 4 chronic kidney disease, or unspecified chronic kidney disease: Secondary | ICD-10-CM | POA: Diagnosis present

## 2017-07-23 DIAGNOSIS — R05 Cough: Secondary | ICD-10-CM | POA: Diagnosis present

## 2017-07-23 DIAGNOSIS — E440 Moderate protein-calorie malnutrition: Secondary | ICD-10-CM | POA: Diagnosis not present

## 2017-07-23 DIAGNOSIS — I952 Hypotension due to drugs: Secondary | ICD-10-CM | POA: Diagnosis not present

## 2017-07-23 DIAGNOSIS — R059 Cough, unspecified: Secondary | ICD-10-CM

## 2017-07-23 DIAGNOSIS — G9389 Other specified disorders of brain: Secondary | ICD-10-CM

## 2017-07-23 DIAGNOSIS — G8194 Hemiplegia, unspecified affecting left nondominant side: Secondary | ICD-10-CM | POA: Diagnosis present

## 2017-07-23 DIAGNOSIS — E1169 Type 2 diabetes mellitus with other specified complication: Secondary | ICD-10-CM

## 2017-07-23 DIAGNOSIS — I5030 Unspecified diastolic (congestive) heart failure: Secondary | ICD-10-CM | POA: Diagnosis not present

## 2017-07-23 DIAGNOSIS — I679 Cerebrovascular disease, unspecified: Secondary | ICD-10-CM | POA: Diagnosis not present

## 2017-07-23 DIAGNOSIS — R29722 NIHSS score 22: Secondary | ICD-10-CM | POA: Diagnosis not present

## 2017-07-23 DIAGNOSIS — I6300 Cerebral infarction due to thrombosis of unspecified precerebral artery: Secondary | ICD-10-CM | POA: Diagnosis not present

## 2017-07-23 DIAGNOSIS — I6601 Occlusion and stenosis of right middle cerebral artery: Secondary | ICD-10-CM | POA: Insufficient documentation

## 2017-07-23 DIAGNOSIS — R296 Repeated falls: Secondary | ICD-10-CM | POA: Diagnosis present

## 2017-07-23 DIAGNOSIS — M79644 Pain in right finger(s): Secondary | ICD-10-CM | POA: Diagnosis not present

## 2017-07-23 DIAGNOSIS — G8114 Spastic hemiplegia affecting left nondominant side: Secondary | ICD-10-CM | POA: Diagnosis not present

## 2017-07-23 DIAGNOSIS — R414 Neurologic neglect syndrome: Secondary | ICD-10-CM | POA: Diagnosis not present

## 2017-07-23 DIAGNOSIS — I63231 Cerebral infarction due to unspecified occlusion or stenosis of right carotid arteries: Secondary | ICD-10-CM | POA: Diagnosis not present

## 2017-07-23 HISTORY — DX: Chronic kidney disease, stage 3 unspecified: N18.30

## 2017-07-23 HISTORY — DX: Alzheimer's disease, unspecified: G30.9

## 2017-07-23 HISTORY — DX: Cerebral infarction, unspecified: I63.9

## 2017-07-23 HISTORY — DX: Chronic kidney disease, stage 3 (moderate): N18.3

## 2017-07-23 HISTORY — DX: Dementia in other diseases classified elsewhere, unspecified severity, without behavioral disturbance, psychotic disturbance, mood disturbance, and anxiety: F02.80

## 2017-07-23 LAB — CBC
HEMATOCRIT: 41.2 % (ref 39.0–52.0)
HEMOGLOBIN: 14.2 g/dL (ref 13.0–17.0)
MCH: 28.4 pg (ref 26.0–34.0)
MCHC: 34.5 g/dL (ref 30.0–36.0)
MCV: 82.4 fL (ref 78.0–100.0)
Platelets: 249 10*3/uL (ref 150–400)
RBC: 5 MIL/uL (ref 4.22–5.81)
RDW: 14.3 % (ref 11.5–15.5)
WBC: 7.6 10*3/uL (ref 4.0–10.5)

## 2017-07-23 LAB — COMPREHENSIVE METABOLIC PANEL
ALT: 15 U/L — ABNORMAL LOW (ref 17–63)
ANION GAP: 10 (ref 5–15)
AST: 20 U/L (ref 15–41)
Albumin: 3.9 g/dL (ref 3.5–5.0)
Alkaline Phosphatase: 50 U/L (ref 38–126)
BILIRUBIN TOTAL: 0.7 mg/dL (ref 0.3–1.2)
BUN: 39 mg/dL — ABNORMAL HIGH (ref 6–20)
CHLORIDE: 108 mmol/L (ref 101–111)
CO2: 17 mmol/L — ABNORMAL LOW (ref 22–32)
Calcium: 9.6 mg/dL (ref 8.9–10.3)
Creatinine, Ser: 1.77 mg/dL — ABNORMAL HIGH (ref 0.61–1.24)
GFR, EST AFRICAN AMERICAN: 41 mL/min — AB (ref >60)
GFR, EST NON AFRICAN AMERICAN: 36 mL/min — AB (ref >60)
Glucose, Bld: 107 mg/dL — ABNORMAL HIGH (ref 65–99)
POTASSIUM: 5.6 mmol/L — AB (ref 3.5–5.1)
Sodium: 135 mmol/L (ref 135–145)
TOTAL PROTEIN: 7.7 g/dL (ref 6.5–8.1)

## 2017-07-23 LAB — I-STAT CHEM 8, ED
BUN: 39 mg/dL — AB (ref 6–20)
CALCIUM ION: 1.21 mmol/L (ref 1.15–1.40)
Chloride: 108 mmol/L (ref 101–111)
Creatinine, Ser: 1.6 mg/dL — ABNORMAL HIGH (ref 0.61–1.24)
Glucose, Bld: 103 mg/dL — ABNORMAL HIGH (ref 65–99)
HEMATOCRIT: 45 % (ref 39.0–52.0)
HEMOGLOBIN: 15.3 g/dL (ref 13.0–17.0)
Potassium: 5.6 mmol/L — ABNORMAL HIGH (ref 3.5–5.1)
SODIUM: 138 mmol/L (ref 135–145)
TCO2: 20 mmol/L — AB (ref 22–32)

## 2017-07-23 LAB — I-STAT TROPONIN, ED: TROPONIN I, POC: 0.27 ng/mL — AB (ref 0.00–0.08)

## 2017-07-23 LAB — APTT: APTT: 34 s (ref 24–36)

## 2017-07-23 LAB — DIFFERENTIAL
BASOS ABS: 0 10*3/uL (ref 0.0–0.1)
BASOS PCT: 0 %
EOS PCT: 1 %
Eosinophils Absolute: 0.1 10*3/uL (ref 0.0–0.7)
LYMPHS ABS: 2.4 10*3/uL (ref 0.7–4.0)
Lymphocytes Relative: 32 %
MONOS PCT: 5 %
Monocytes Absolute: 0.4 10*3/uL (ref 0.1–1.0)
Neutro Abs: 4.7 10*3/uL (ref 1.7–7.7)
Neutrophils Relative %: 62 %

## 2017-07-23 LAB — GLUCOSE, CAPILLARY
GLUCOSE-CAPILLARY: 71 mg/dL (ref 65–99)
GLUCOSE-CAPILLARY: 99 mg/dL (ref 65–99)

## 2017-07-23 LAB — ETHANOL: Alcohol, Ethyl (B): <10 mg/dL (ref <10)

## 2017-07-23 LAB — TROPONIN I: Troponin I: 0.16 ng/mL (ref <0.03)

## 2017-07-23 LAB — PROTIME-INR
INR: 1.03
Prothrombin Time: 13.4 seconds (ref 11.4–15.2)

## 2017-07-23 MED ORDER — ACETAMINOPHEN 650 MG RE SUPP: 650.0000 mg | RECTAL | Status: DC | PRN

## 2017-07-23 MED ORDER — SODIUM CHLORIDE 0.9 % IV SOLN
INTRAVENOUS | Status: DC
  Administered 2017-07-23 – 2017-07-25 (×6): via INTRAVENOUS

## 2017-07-23 MED ORDER — SENNOSIDES-DOCUSATE SODIUM 8.6-50 MG PO TABS: 1.0000 | ORAL_TABLET | Freq: Every day | ORAL | Status: DC

## 2017-07-23 MED ORDER — ACETAMINOPHEN 325 MG PO TABS: 650.0000 mg | ORAL_TABLET | ORAL | Status: DC | PRN

## 2017-07-23 MED ORDER — ACETAMINOPHEN 160 MG/5ML PO SOLN: 650.0000 mg | ORAL | Status: DC | PRN

## 2017-07-23 MED ORDER — INSULIN ASPART 100 UNIT/ML ~~LOC~~ SOLN
0.0000 [IU] | Freq: Three times a day (TID) | SUBCUTANEOUS | Status: DC
  Administered 2017-07-28: 1 [IU] via SUBCUTANEOUS

## 2017-07-23 MED ORDER — STROKE: EARLY STAGES OF RECOVERY BOOK
Freq: Once | Status: AC
  Administered 2017-07-23: 20:00:00
  Filled 2017-07-23: qty 1

## 2017-07-23 MED ORDER — HEPARIN SODIUM (PORCINE) 5000 UNIT/ML IJ SOLN: 5000.0000 [IU] | Freq: Three times a day (TID) | INTRAMUSCULAR | Status: DC

## 2017-07-23 MED ORDER — IOPAMIDOL (ISOVUE-370) INJECTION 76%
90.0000 mL | Freq: Once | INTRAVENOUS | Status: AC | PRN
  Administered 2017-07-23: 90 mL via INTRAVENOUS

## 2017-07-23 MED ORDER — SODIUM CHLORIDE 0.9 % IV SOLN
INTRAVENOUS | Status: DC
  Administered 2017-07-23: 18:00:00 via INTRAVENOUS

## 2017-07-23 NOTE — H&P (Signed)
[facility] History and Physical Service Pager: [phone]  Patient name: Noah RumpfJames Crusoe Jr. Medical record number: [id number] Date of birth: 1941/02/10 Age: 77 y.o. Gender: male  Primary Care Provider: Patient, No Pcp Per Consultants: neurology Code Status: full  Chief Complaint: difficulty walking  Assessment and Plan: Noah RumpfJames Banet Jr. is a 77 y.o. male presenting as a code stroke with difficulty walking, drooling, and facial droop. PMH is significant for HTN, Alzheimer dementia, CKD III, and T2DM  Subacute R MCA Infarct: Patient presented as code stroke following one day of difficulty walking, drooling, and facial droop after missing all home meds x 5days. Last known normal unclear, best guess by family was yesterday. On exam, patient with mildly dysarthric speech, L sided weakness, questionable L sided sensory deficits, and rightward gaze deviation. CT with large subacute R posterior MCA infarct with mass effect and partial R ventricle effacement (although no midline shift) and CT angio with diffuse moderate to severe intracranial atherosclerotic disease (including heavily calcified carotids, L PCA, distal R MCA), R PCA occlusion, compatible with subacute infarct of R MCA territory. Seen by neurology on presentation to the ED who felt he was out of the window for thrombolysis or mechanical thrombectomy given the size of the infarct. He has remained HDS since presentation; will admit for rehabilitation and risk management.  - Admit to stepdown with telemetry, attending Dr. Deirdre Priesthambliss - MRI brain pending - f/u lipids, A1C, TSH  - f/u EtOH, UDS - plan for DAPT with ASA and clopidigrel x 3 mo pending passing swallow evaluation   - f/u echo - permissive HTN for 24-48 hrs - SLP, PT, and OT consults placed - NS @ 125 cc/hr  Elevated Troponin: Patient denies chest pain; I-stat troponin 0.27 in ED but EKG in NSR and known LBBB.  - Trend troponins - AM  EKG  Hyperkalemia: K 5.6 on admission; patient denies symptoms and EKG without significant changes from.  - f/u AM BMP - IVF as above  Type 2 Diabetes: last A1C 6.6 in 03/2013; patient on one home oral med but family unable to recall.   - f/u A1C - sSSI and CBGs  HTN: Unsure of home meds; patient has not been taking any meds for 5 days and family reports BP was "very high" at last clinic visit.  - permissive HTN after 24-48 hours; plan to resume home antihypertensives morning of 2/23  CKD III: Per chart review, baseline likely Cr ~1.6-1.8. 1.77 on admission - f/u AM BMP - avoid nephrotoxic agents  FEN/GI: NPO until formal speech eval; NS at 125 cc/hr Prophylaxis: SCDs until MRI confirms no hemorrhagic element to stroke  Disposition: admit to stepdown status  History of Present Illness:  Noah RumpfJames Pelzer Jr. is a 77 y.o. male presenting with new-onset weakness and drooling. He is hard of hearing and a poor historian; history obtained with the help of son and grandson at bedside.   They report his first symptoms started with SOB earlier in the month; he went to his PCP on 2/6 who made some adjustments to his medications (he also had elevated blood pressure at this visit), but there was a mixup in the pharmacy and he instead got refills of old medicines in addition to his new medications. They report that, after this, he continued to have SOB and started to feel poorly. He stopped taking all home medications five days ago and felt better, however, he started acting differently yesterday and fell in the kitchen (  family witnessed fall, said his R leg shook and gave out from underneath him). He had some difficulty walking afterwards, but, per family was largely acting normally and wanted to watch the basketball game. This morning when he woke up, however, he had more difficulty walking, was drooling, and was unable to smile. They called their PCP who recommended calling an ambulance.   He denies  tobacco use. He used to drink alcohol occasionally but now "only a nip here and there". He does all his own ADLs and IADLs such as driving and goes to work.  Review Of Systems: Per HPI with the following additions:   Review of Systems  Constitutional: Negative for chills and fever.  Respiratory: Positive for cough and shortness of breath. Negative for wheezing.   Cardiovascular: Negative for chest pain and palpitations.  Gastrointestinal: Negative for abdominal pain, constipation and diarrhea.  Genitourinary: Negative for dysuria, frequency and urgency.  Musculoskeletal: Positive for falls.  Neurological: Positive for speech change and focal weakness.    Patient Active Problem List   Diagnosis Date Noted  . 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 Brain herniation (HCC) 07/23/2017  . Ischemic stroke (HCC) 07/23/2017  . Encounter for screening colonoscopy 03/12/2015    Past Medical History: Past Medical History:  Diagnosis Date  . Chronic kidney disease   . Gout   . Hypertension     Past Surgical History: Past Surgical History:  Procedure Laterality Date  . COLONOSCOPY WITH PROPOFOL N/A 05/09/2015   Procedure: COLONOSCOPY WITH PROPOFOL;  Surgeon: [name], MD;  Location: [facility] ENDOSCOPY;  Service: Endoscopy;  Laterality: N/A;  . TIBIA FRACTURE SURGERY Left     Social History: Social History   Tobacco Use  . Smoking status: Never Smoker  . Smokeless tobacco: Never Used  Substance Use Topics  . Alcohol use: No    Alcohol/week: 0.0 oz  . Drug use: No   Additional social history: lives in an attached house with his son. He does  Please also refer to relevant sections of EMR.  Family History: Family History  Problem Relation Age of Onset  . Diabetes Paternal Grandfather     Allergies and Medications: No Known Allergies No current facility-administered medications on file prior to encounter.     Current Outpatient Medications on File Prior to Encounter  Medication Sig Dispense Refill  . polyethylene glycol powder (GLYCOLAX/MIRALAX) powder 255 grams one bottle for colonoscopy prep (Patient not taking: Reported on 06/29/2017) 255 g 0    Objective: BP 126/72   Pulse 76   Temp 98.6 F (37 C) (Oral)   Resp 17   Ht 5\' 11"  (1.803 m)   Wt 274 lb (124.3 kg)   SpO2 91%   BMI 38.22 kg/m  Exam: General: resting comfortably on hospital bed, rouses to voice but drifts off frequently/somewhat somnolent, NAD, oriented to person and year; unable to state location but once the name of hospital given, was able to state city correctly Eyes: Rightward gaze deviation, unable to track horizontally to the L  ENTM: MMM. No tongue deviation, uvula midline.  Neck: R sided tilt but full ROM Cardiovascular: RRR, no m/r/g appreciated. ~3-4 sec cap refill Respiratory: Normal WOB on 2L O2, satting 100%. No appreciable crackles or wheezes but significant upper airway noise limiting exam. No cyanosis or clubbing, ?absent lunulae.  Gastrointestinal: Normoactive bowel sounds, soft, nontender, no appreciable organomegaly.  MSK: 5/5 strength in RUE and RLE. LLE  3+/5 (able to lift off of bed against gravity) and with trace pitting edema to the mid shin. LUE 3+/5 (able to lift against gravity but drifts towards midline). Grip strength R>L.  Derm: no appreciable rashes or lesions Neuro: Sensation to light touch intact on face and R side; states that touch feels "different" on L side and is unable to discern light touch on LLE. L sided facial droop. Psych: orientation as described above  Labs and Imaging: CBC BMET  Recent Labs  Lab 07/23/17 1325 07/23/17 1334  WBC 7.6  --   HGB 14.2 15.3  HCT 41.2 45.0  PLT 249  --    Recent Labs  Lab 07/23/17 1325 07/23/17 1334  NA 135 138  K 5.6* 5.6*  CL 108 108  CO2 17*  --   BUN 39* 39*  CREATININE 1.77* 1.60*  GLUCOSE 107* 103*  CALCIUM 9.6  --       CXR IMPRESSION: Cardiomegaly without acute cardiopulmonary findings.   CT Head wo Contrast IMPRESSION: 1. Right inferior MCA distribution late acute to subacute infarction involving temporal lobe, insula, and basal ganglia. Mass effect with partial effacement of right lateral ventricle and minimal right uncal herniation. No significant midline shift. No appreciable hemorrhage. 2. Density of right distal M1 and proximal M2 branches may represent thrombus. 3. ASPECTS is 4  CT Angio Head IMPRESSION: - Heavily calcified carotid bifurcation bilaterally with mild stenosis bilaterally. Heavily calcified moderate stenosis of the cavernous carotid bilaterally. - Moderate stenosis proximal right M1 segment and distal right middle cerebral artery. Right middle cerebral arteries are diseased but patent. Mild left middle cerebral artery stenosis proximally and moderate stenosis distal left middle cerebral artery. Diffuse intracranial atherosclerotic disease with occlusion of the right posterior cerebral artery and diffuse disease in the left posterior cerebral artery. - CT perfusion compatible with subacute infarct right MCA territory. CT perfusion under represents the size of the infarct based on CT Head compatible with subacute infarct and pseudo normalization.  Desiree Hane, Medical Student 07/23/2017, 5:36 PM MS4, [facility] FPTS Intern pager: [phone], text pages welcome  FPTS Upper-Level Resident Addendum  I have independently interviewed and examined the patient. I have discussed the above with the original author and agree with their documentation. My edits for correction/addition/clarification are in blue. Please see also any attending notes.   Leland Her, DO PGY-2, [facility] 07/23/2017 7:02 PM  FPTS Service pager: [phone] (text pages welcome through [employer])

## 2017-07-23 NOTE — Code Documentation (Signed)
77[facility] at [facility]3.  Patient from home where he was reportedly LKW at 0700 and later found on the floor of the house. EMS assessed right gaze, left facial droop and left sided weakness and activated a code stroke.  Of note, patient sustained a fall yesterday.  Stroke team at the bedside on patient arrival.  Labs drawn and patient to CT with team.  CT completed followed by CTP and CTA.  NIHSS 10, see documentation for details and code stroke times.  Patient with left facial droop, right gaze preference, left hemianopia, mild dysarthria and left neglect and sensory loss on exam.  Patient is outside the window for treatment with tPA.  Patient is not a candidate for endovascular intervention. Patient to be admitted for stroke workup. Bedside handoff with ED RN Asher MuirJamie.

## 2017-07-23 NOTE — Discharge Summary (Incomplete)
[facility]  Patient name: Noah RumpfJames Sica Jr. Medical record number: [id number] Date of birth: Jul 04, 1940 Age: 77 y.o. Gender: male Date of Admission: 07/23/2017  Date of Discharge: *** Admitting Physician: Carney LivingMarshall L Chambliss, MD  Primary Care Provider: Patient, No Pcp Per Consultants: ***  Indication for Hospitalization: CVA  Discharge Diagnoses/Problem List:  HTN Type   Disposition: ***  Discharge Condition: ***  Discharge Exam: ***  Brief Hospital Course:  ***  Issues for Follow Up:  1. ***  Significant Procedures: ***  Significant Labs and Imaging:  Recent Labs  Lab 07/23/17 1325 07/23/17 1334  WBC 7.6  --   HGB 14.2 15.3  HCT 41.2 45.0  PLT 249  --    Recent Labs  Lab 07/23/17 1325 07/23/17 1334  NA 135 138  K 5.6* 5.6*  CL 108 108  CO2 17*  --   GLUCOSE 107* 103*  BUN 39* 39*  CREATININE 1.77* 1.60*  CALCIUM 9.6  --   ALKPHOS 50  --   AST 20  --   ALT 15*  --   ALBUMIN 3.9  --     ***  Results/Tests Pending at Time of Discharge: ***  Discharge Medications:  Allergies as of 07/23/2017   No Known Allergies   Med Rec must be completed prior to using this [facility]     Discharge Instructions: Please refer to Patient Instructions section of EMR for full details.  Patient was counseled important signs and symptoms that should prompt return to medical care, changes in medications, dietary instructions, activity restrictions, and follow up appointments.   Follow-Up Appointments:   Desiree HaneMichaels, Haylei Cobin, Medical Student 07/23/2017, 7:43 PM PGY-***, [facility]

## 2017-07-23 NOTE — ED Provider Notes (Signed)
[facility] EMERGENCY DEPARTMENT Provider Note   CSN: [id number] Arrival date & time: 07/23/17  1323     History   Chief Complaint No chief complaint on file.   HPI Noah Bush. is a 77 y.o. male.  HPI  Patient as code stroke sent from [place].  77 year old man with reported onset of weakness and difficulty walking that began yesterday.  He was sent from [place] with new onset of left-sided weakness, left-sided facial droop, and right-sided gaze.  Prehospital code stroke was not called.  Son reports his symptoms began yesterday.  Patient and son report that he has been ill over the past week with some cough and congestion.  He has a history of diabetes reports no problems with his blood sugars  Past Medical History:  Diagnosis Date  . Chronic kidney disease   . Gout   . Hypertension     Patient Active Problem List   Diagnosis Date Noted  . Encounter for screening colonoscopy 03/12/2015    Past Surgical History:  Procedure Laterality Date  . COLONOSCOPY WITH PROPOFOL N/A 05/09/2015   Procedure: COLONOSCOPY WITH PROPOFOL;  Surgeon: [name], MD;  Location: [facility] ENDOSCOPY;  Service: Endoscopy;  Laterality: N/A;  . TIBIA FRACTURE SURGERY Left        Home Medications    Prior to Admission medications   Medication Sig Start Date End Date Taking? Authorizing Provider  allopurinol (ZYLOPRIM) 100 MG tablet TAKE ONE TABLET BY MOUTH EVERY DAY FOR FOR GOUT. STOP 300MG  02/19/15   [provider]  Cyanocobalamin (VITAMIN B12 PO) Take 1 tablet by mouth daily.    [provider]  donepezil (ARICEPT) 5 MG tablet Take 5 mg by mouth daily. 02/19/15   [provider]  polyethylene glycol powder (GLYCOLAX/MIRALAX) powder 255 grams one bottle for colonoscopy prep Patient not taking: Reported on 06/29/2017 03/12/15   [name], MD  tamsulosin (FLOMAX) 0.4 MG CAPS capsule TAKE ONE CAPSULE BY MOUTH FOR BPH 02/19/15   [provider]    Family History Family History  Problem Relation Age of Onset  . Diabetes Paternal Grandfather     Social History Social History   Tobacco Use  . Smoking status: Never Smoker  . Smokeless tobacco: Never Used  Substance Use Topics  . Alcohol use: No    Alcohol/week: 0.0 oz  . Drug use: No     Allergies   Patient has no known allergies.   Review of Systems Review of Systems  All other systems reviewed and are negative.    Physical Exam Updated Vital Signs There were no vitals taken for this visit.  Physical Exam  Constitutional: He is oriented to person, place, and time. He appears well-developed and well-nourished.  HENT:  Head: Normocephalic and atraumatic.  Right Ear: External ear normal.  Left Ear: External ear normal.  Mouth/Throat: Oropharynx is clear and moist.  Eyes: Pupils are equal, round, and reactive to light.  Right sized gaze unable to cross midline to the left Left visual field deficit  Neck: Normal range of motion. Neck supple.  Cardiovascular: Normal rate and regular rhythm.  Pulmonary/Chest: Effort normal and breath sounds normal.  Abdominal: Soft. Bowel sounds are normal.  Musculoskeletal: Normal range of motion.  Neurological: He is alert and oriented to person, place, and time. He displays normal reflexes. A cranial nerve deficit is present. He exhibits normal muscle tone. Coordination normal.  Facial droop Left palmar drift Bilateral lower extremity  strength 5/5  Skin: Skin is warm and dry. Capillary refill takes less than 2 seconds.  Psychiatric: He has a normal mood and affect.  Nursing note and vitals reviewed.    ED Treatments / Results  Labs (all labs ordered are listed, but only abnormal results are displayed) Labs Reviewed  I-STAT TROPONIN, ED - Abnormal; Notable for the following components:      Result Value   Troponin i, poc 0.27 (*)    All other components within normal limits  I-STAT CHEM 8, ED -  Abnormal; Notable for the following components:   Potassium 5.6 (*)    BUN 39 (*)    Creatinine, Ser 1.60 (*)    Glucose, Bld 103 (*)    TCO2 20 (*)    All other components within normal limits  PROTIME-INR  APTT  CBC  DIFFERENTIAL  COMPREHENSIVE METABOLIC PANEL  CBG MONITORING, ED    EKG  EKG Interpretation  Date/Time:  Thursday July 23 2017 14:06:29 EST Ventricular Rate:  75 PR Interval:    QRS Duration: 171 QT Interval:  432 QTC Calculation: 483 R Axis:   63 Text Interpretation:  Normal sinus rhythm Left bundle branch block Confirmed by Margarita Grizzle [phone]) on 07/23/2017 2:10:23 PM       Radiology Ct Head Code Stroke Wo Contrast  Result Date: 07/23/2017 CLINICAL DATA:  Code stroke. 77 y/o M; fall yesterday, unable to walk with new onset today. EXAM: CT HEAD WITHOUT CONTRAST TECHNIQUE: Contiguous axial images were obtained from the base of the skull through the vertex without intravenous contrast. COMPARISON:  05/13/2013 CT head. FINDINGS: Brain: Large right MCA distribution late acute to subacute infarction involving the anterolateral temporal lobe, caudate head, putamen, and insula. Edema and local mass effect with partial effacement of the right lateral ventricle. Minimal right-sided uncal herniation. No significant midline shift. No hemorrhage. No additional area of infarction identified. Stable background of chronic microvascular ischemic changes and parenchymal volume loss of the brain. Vascular: Increased density of right distal M1 and proximal M2 branches may represent thrombus. Calcific atherosclerosis of carotid siphons. Skull: Normal. Negative for fracture or focal lesion. Sinuses/Orbits: No acute finding. Other: Right-sided glaucoma device. ASPECTS [facility] Stroke Program Early CT Score) - Ganglionic level infarction (caudate, lentiform nuclei, internal capsule, insula, M1-M3 cortex): 1 - Supraganglionic infarction (M4-M6 cortex): 3 Total score (0-10 with 10 being  normal): 4 IMPRESSION: 1. Right inferior MCA distribution late acute to subacute infarction involving temporal lobe, insula, and basal ganglia. Mass effect with partial effacement of right lateral ventricle and minimal right uncal herniation. No significant midline shift. No appreciable hemorrhage. 2. Density of right distal M1 and proximal M2 branches may represent thrombus. 3. ASPECTS is 4 These results were communicated to Dr. Pearlean Brownie at 1:46 pmon 2/21/2019by text page via the [facility] messaging system. Electronically Signed   By: Mitzi Hansen M.D.   On: 07/23/2017 13:47    Procedures Procedures (including critical care time)  Medications Ordered in ED Medications  iopamidol (ISOVUE-370) 76 % injection 90 mL (90 mLs Intravenous Contrast Given 07/23/17 1347)     Initial Impression / Assessment and Plan / ED Course  I have reviewed the triage vital signs and the nursing notes.  Pertinent labs & imaging results that were available during my care of the patient were reviewed by me and considered in my medical decision making (see chart for details).    77 y.o. Male with ho of dm presents today with right side preference  gaze and intermittent left upper extremity weakness.  Seen by  1- stroke- patient with large cva seen on ct correlates with pe.  Historically symptoms began yesterday.  Patient seen as lvo by Dr. Pearlean BrownieSethi.  Aspects 4- no intervention indicated  Please see neuro note 2- positive troponin- no co chest pain to me,   3- hyperkalemia- in setting of kidney renal insufficiency- ? Some aki and volume depletion- will hydrate, patient on monitor. Plan admission to medicine (step down per Dr. Pearlean BrownieSethi)  Patient unassigned. Discussed with Dr. Karen ChafeLockamy and [facility] Final Clinical Impressions[facility]s) / ED Diagnoses   Final diagnoses:  Cerebrovascular accident (CVA), unspecified mechanism (HCC)  Hyperkalemia    ED Discharge Orders    None       Margarita Grizzleay, Adante Courington, MD 07/23/17  1501

## 2017-07-23 NOTE — Consult Note (Signed)
Reason for Consult:Code Stroke Referring Physician: Briant Sites, MD  Noah Bush. is an 77 y.o. male.  HPI:  [name] is a 77 year old African-American male who was brought in as a code stroke by [facility]. History is obtained from EMS as well as patient. Patient is a poor historian. He states that he fell yesterday evening but was able to go back to bed. This morning he fell again. His son arrived and noticed that he had right gaze and head deviation. They called his primary care physician in the event to the physician's office. The physician noticed she had left-sided weakness and called EMS who called a code stroke. Patient was noted having right gaze preference, right head deviation and left-sided weakness and facial weakness by EMS. He was met on the bridge upon arrival by me in the stroke team. CT scan of the head showed large subacute right posterior division MCA infarct as well as right basal ganglia hyperdensity. Aspect score was 4. Emergent CT angiogram showed severe stenosis of right middle cerebral artery with occlusion of posterior division. CT perfusion showed luxury perfusion and no significant cor3 or mismatch. Blood sugar was 108 mg percent by EMS and blood pressure is 138/94.  Last seen normal : unclear ? 07/22/17 pm Iv TPa : No outside time window Not a mech thrombectomy candidate due to large infarct and poor ASPECTs score of 4  Past Medical History:  Diagnosis Date  . Chronic kidney disease   . Gout   . Hypertension     Past Surgical History:  Procedure Laterality Date  . COLONOSCOPY WITH PROPOFOL N/A 05/09/2015   Procedure: COLONOSCOPY WITH PROPOFOL;  Surgeon: Robert Bellow, MD;  Location: [facility] ENDOSCOPY;  Service: Endoscopy;  Laterality: N/A;  . TIBIA FRACTURE SURGERY Left     Family History  Problem Relation Age of Onset  . Diabetes Paternal Grandfather     Social History:  reports that  has never smoked. he has never used smokeless tobacco. He  reports that he does not drink alcohol or use drugs.  Allergies: No Known Allergies  Medications: I have reviewed the patient's current medications.  Results for orders placed or performed during the hospital encounter of 07/23/17 (from the past 48 hour(s))  Protime-INR     Status: None   Collection Time: 07/23/17  1:25 PM  Result Value Ref Range   Prothrombin Time 13.4 11.4 - 15.2 seconds   INR 1.03     Comment: Performed at [facility] [address], [place], [place] [address]  APTT     Status: None   Collection Time: 07/23/17  1:25 PM  Result Value Ref Range   aPTT 34 24 - 36 seconds    Comment: Performed at [facility] [address], [place] [address]  CBC     Status: None   Collection Time: 07/23/17  1:25 PM  Result Value Ref Range   WBC 7.6 4.0 - 10.5 K/uL   RBC 5.00 4.22 - 5.81 MIL/uL   Hemoglobin 14.2 13.0 - 17.0 g/dL   HCT 41.2 39.0 - 52.0 %   MCV 82.4 78.0 - 100.0 fL   MCH 28.4 26.0 - 34.0 pg   MCHC 34.5 30.0 - 36.0 g/dL   RDW 14.3 11.5 - 15.5 %   Platelets 249 150 - 400 K/uL    Comment: Performed at [facility], [place] [address], [place], [place] [address]  Differential  Status: None   Collection Time: 07/23/17  1:25 PM  Result Value Ref Range   Neutrophils Relative % 62 %   Neutro Abs 4.7 1.7 - 7.7 K/uL   Lymphocytes Relative 32 %   Lymphs Abs 2.4 0.7 - 4.0 K/uL   Monocytes Relative 5 %   Monocytes Absolute 0.4 0.1 - 1.0 K/uL   Eosinophils Relative 1 %   Eosinophils Absolute 0.1 0.0 - 0.7 K/uL   Basophils Relative 0 %   Basophils Absolute 0.0 0.0 - 0.1 K/uL    Comment: Performed at [facility] [address] [place], [place] [address]  Comprehensive metabolic panel     Status: Abnormal   Collection Time: 07/23/17  1:25 PM  Result Value Ref Range   Sodium 135 135 - 145 mmol/L   Potassium 5.6 (H) 3.5 - 5.1 mmol/L   Chloride 108 101 - 111 mmol/L   CO2 17 (L) 22 - 32 mmol/L   Glucose, Bld 107 (H) 65 - 99 mg/dL    BUN 39 (H) 6 - 20 mg/dL   Creatinine, Ser 1.77 (H) 0.61 - 1.24 mg/dL   Calcium 9.6 8.9 - 10.3 mg/dL   Total Protein 7.7 6.5 - 8.1 g/dL   Albumin 3.9 3.5 - 5.0 g/dL   AST 20 15 - 41 U/L   ALT 15 (L) 17 - 63 U/L   Alkaline Phosphatase 50 38 - 126 U/L   Total Bilirubin 0.7 0.3 - 1.2 mg/dL   GFR calc non Af Amer 36 (L) >60 mL/min   GFR calc Af Amer 41 (L) >60 mL/min    Comment: (NOTE) The eGFR has been calculated using the CKD EPI equation. This calculation has not been validated in all clinical situations. eGFR's persistently <60 mL/min signify possible Chronic Kidney Disease.    Anion gap 10 5 - 15    Comment: Performed at [facility] [address], [place], [place] [address]  I-stat troponin, ED     Status: Abnormal   Collection Time: 07/23/17  1:33 PM  Result Value Ref Range   Troponin i, poc 0.27 (HH) 0.00 - 0.08 ng/mL   Comment NOTIFIED PHYSICIAN    Comment 3            Comment: Due to the release kinetics of cTnI, a negative result within the first hours of the onset of symptoms does not rule out myocardial infarction with certainty. If myocardial infarction is still suspected, repeat the test at appropriate intervals.   I-Stat Chem 8, ED     Status: Abnormal   Collection Time: 07/23/17  1:34 PM  Result Value Ref Range   Sodium 138 135 - 145 mmol/L   Potassium 5.6 (H) 3.5 - 5.1 mmol/L   Chloride 108 101 - 111 mmol/L   BUN 39 (H) 6 - 20 mg/dL   Creatinine, Ser 1.60 (H) 0.61 - 1.24 mg/dL   Glucose, Bld 103 (H) 65 - 99 mg/dL   Calcium, Ion 1.21 1.15 - 1.40 mmol/L   TCO2 20 (L) 22 - 32 mmol/L   Hemoglobin 15.3 13.0 - 17.0 g/dL   HCT 45.0 39.0 - 52.0 %    Ct Angio Head W Or Wo Contrast  Result Date: 07/23/2017 CLINICAL DATA:  Stroke. EXAM: CT ANGIOGRAPHY HEAD AND NECK CT PERFUSION BRAIN TECHNIQUE: Multidetector CT imaging of the head and neck was performed using the standard protocol during bolus administration of intravenous contrast. Multiplanar CT image  reconstructions and MIPs were  obtained to evaluate the vascular anatomy. Carotid stenosis measurements (when applicable) are obtained utilizing NASCET criteria, using the distal internal carotid diameter as the denominator. Multiphase CT imaging of the brain was performed following IV bolus contrast injection. Subsequent parametric perfusion maps were calculated using RAPID software. CONTRAST:  59m ISOVUE-370 IOPAMIDOL (ISOVUE-370) INJECTION 76% COMPARISON:  CT head 07/23/2017 FINDINGS: CTA NECK FINDINGS Aortic arch: Atherosclerotic disease in the aortic arch. 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 Ascending aorta measures 37 mm in diameter. Atherosclerotic disease in the proximal great vessels which are patent. Right carotid system: Calcified plaque right carotid bifurcation. Less than 25% diameter stenosis right internal carotid artery at the origin. Left carotid system: Atherosclerotic disease at the proximal left common carotid artery which is mildly narrowed. Atherosclerotic calcification left carotid bifurcation with less than 25% diameter stenosis. Diffusely diseased left common carotid artery Vertebral arteries: Right vertebral artery dominant and patent to the basilar. Calcified plaque at the origin. Small left vertebral artery ends in PICA. Skeleton: Cervical degenerative change. No acute skeletal abnormality. Other neck: Negative for mass or adenopathy. Upper chest: Negative Review of the MIP images confirms the above findings CTA HEAD FINDINGS Anterior circulation: Cavernous carotid is heavily calcified bilaterally with moderate stenosis right greater than left Moderate stenosis proximal right M1 segment. Moderate disease right middle cerebral artery bifurcation with moderate stenosis at the origin of the posterior division right MCA. Anterior division of the right middle cerebral artery irregular but patent. Mild stenosis left M1 segment. Moderate stenosis distal left M1. Left middle cerebral  artery is patent with scattered atherosclerotic disease. Both anterior cerebral arteries patent Posterior circulation: Mild stenosis distal right vertebral artery which supplies the basilar. Left vertebral artery ends in PICA. PICA patent bilaterally. Basilar widely patent. Superior cerebellar arteries patent bilaterally. Fetal origin right posterior cerebral artery. Occlusion distal right posterior cerebral artery. Diffuse atherosclerotic disease left posterior cerebral artery. Venous sinuses: Patent Anatomic variants: None Delayed phase: Not perform Review of the MIP images confirms the above findings CT Brain Perfusion Findings: CBF (<30%) Volume: 22mPerfusion (Tmax>6.0s) volume: 3166mismatch Volume: 3mL11mfarction Location:Right MCA territory involving the right temporal lobe and right parietal lobe. CT perfusion volume is smaller than that seen on the recent head CT. This may be due to subacute infarct and pseudo normalization of the CT perfusion. Overall, the CT scan is felt to be more accurate representation of the large territory right MCA infarct. IMPRESSION: Heavily calcified carotid bifurcation bilaterally with mild stenosis bilaterally. Heavily calcified moderate stenosis of the cavernous carotid bilaterally. Moderate stenosis proximal right M1 segment and distal right middle cerebral artery. Right middle cerebral arteries are diseased but patent. Mild left middle cerebral artery stenosis proximally and moderate stenosis distal left middle cerebral artery. Diffuse intracranial atherosclerotic disease with occlusion of the right posterior cerebral artery and diffuse disease in the left posterior cerebral artery. CT perfusion compatible with subacute infarct right MCA territory. CT perfusion under represents the size of the infarct based on CT Head compatible with subacute infarct and pseudo normalization. These results were reviewed in person at the time of interpretation on 07/23/2017 at 2:21 pm to Dr.  PRAMAntony Bush verbally acknowledged these results. Electronically Signed   By: CharFranchot Gallo.   On: 07/23/2017 14:23   Ct Angio Neck W Or Wo Contrast  Result Date: 07/23/2017 CLINICAL DATA:  Stroke. EXAM: CT ANGIOGRAPHY HEAD AND NECK CT PERFUSION BRAIN TECHNIQUE: Multidetector CT imaging of the head and neck was  performed using the standard protocol during bolus administration of intravenous contrast. Multiplanar CT image reconstructions and MIPs were obtained to evaluate the vascular anatomy. Carotid stenosis measurements (when applicable) are obtained utilizing NASCET criteria, using the distal internal carotid diameter as the denominator. Multiphase CT imaging of the brain was performed following IV bolus contrast injection. Subsequent parametric perfusion maps were calculated using RAPID software. CONTRAST:  40m ISOVUE-370 IOPAMIDOL (ISOVUE-370) INJECTION 76% COMPARISON:  CT head 07/23/2017 FINDINGS: CTA NECK FINDINGS Aortic arch: Atherosclerotic disease in the aortic arch. 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 Ascending aorta measures 37 mm in diameter. Atherosclerotic disease in the proximal great vessels which are patent. Right carotid system: Calcified plaque right carotid bifurcation. Less than 25% diameter stenosis right internal carotid artery at the origin. Left carotid system: Atherosclerotic disease at the proximal left common carotid artery which is mildly narrowed. Atherosclerotic calcification left carotid bifurcation with less than 25% diameter stenosis. Diffusely diseased left common carotid artery Vertebral arteries: Right vertebral artery dominant and patent to the basilar. Calcified plaque at the origin. Small left vertebral artery ends in PICA. Skeleton: Cervical degenerative change. No acute skeletal abnormality. Other neck: Negative for mass or adenopathy. Upper chest: Negative Review of the MIP images confirms the above findings CTA HEAD FINDINGS Anterior  circulation: Cavernous carotid is heavily calcified bilaterally with moderate stenosis right greater than left Moderate stenosis proximal right M1 segment. Moderate disease right middle cerebral artery bifurcation with moderate stenosis at the origin of the posterior division right MCA. Anterior division of the right middle cerebral artery irregular but patent. Mild stenosis left M1 segment. Moderate stenosis distal left M1. Left middle cerebral artery is patent with scattered atherosclerotic disease. Both anterior cerebral arteries patent Posterior circulation: Mild stenosis distal right vertebral artery which supplies the basilar. Left vertebral artery ends in PICA. PICA patent bilaterally. Basilar widely patent. Superior cerebellar arteries patent bilaterally. Fetal origin right posterior cerebral artery. Occlusion distal right posterior cerebral artery. Diffuse atherosclerotic disease left posterior cerebral artery. Venous sinuses: Patent Anatomic variants: None Delayed phase: Not perform Review of the MIP images confirms the above findings CT Brain Perfusion Findings: CBF (<30%) Volume: 261mPerfusion (Tmax>6.0s) volume: 3115mismatch Volume: 3mL90mfarction Location:Right MCA territory involving the right temporal lobe and right parietal lobe. CT perfusion volume is smaller than that seen on the recent head CT. This may be due to subacute infarct and pseudo normalization of the CT perfusion. Overall, the CT scan is felt to be more accurate representation of the large territory right MCA infarct. IMPRESSION: Heavily calcified carotid bifurcation bilaterally with mild stenosis bilaterally. Heavily calcified moderate stenosis of the cavernous carotid bilaterally. Moderate stenosis proximal right M1 segment and distal right middle cerebral artery. Right middle cerebral arteries are diseased but patent. Mild left middle cerebral artery stenosis proximally and moderate stenosis distal left middle cerebral artery.  Diffuse intracranial atherosclerotic disease with occlusion of the right posterior cerebral artery and diffuse disease in the left posterior cerebral artery. CT perfusion compatible with subacute infarct right MCA territory. CT perfusion under represents the size of the infarct based on CT Head compatible with subacute infarct and pseudo normalization. These results were reviewed in person at the time of interpretation on 07/23/2017 at 2:21 pm to Dr. PRAMAntony Bush verbally acknowledged these results. Electronically Signed   By: CharFranchot Gallo.   On: 07/23/2017 14:23   Ct Cerebral Perfusion W Contrast  Result Date: 07/23/2017 CLINICAL DATA:  Stroke. EXAM: CT ANGIOGRAPHY  HEAD AND NECK CT PERFUSION BRAIN TECHNIQUE: Multidetector CT imaging of the head and neck was performed using the standard protocol during bolus administration of intravenous contrast. Multiplanar CT image reconstructions and MIPs were obtained to evaluate the vascular anatomy. Carotid stenosis measurements (when applicable) are obtained utilizing NASCET criteria, using the distal internal carotid diameter as the denominator. Multiphase CT imaging of the brain was performed following IV bolus contrast injection. Subsequent parametric perfusion maps were calculated using RAPID software. CONTRAST:  34m ISOVUE-370 IOPAMIDOL (ISOVUE-370) INJECTION 76% COMPARISON:  CT head 07/23/2017 FINDINGS: CTA NECK FINDINGS Aortic arch: Atherosclerotic disease in the aortic arch. 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 Ascending aorta measures 37 mm in diameter. Atherosclerotic disease in the proximal great vessels which are patent. Right carotid system: Calcified plaque right carotid bifurcation. Less than 25% diameter stenosis right internal carotid artery at the origin. Left carotid system: Atherosclerotic disease at the proximal left common carotid artery which is mildly narrowed. Atherosclerotic calcification left carotid bifurcation with less  than 25% diameter stenosis. Diffusely diseased left common carotid artery Vertebral arteries: Right vertebral artery dominant and patent to the basilar. Calcified plaque at the origin. Small left vertebral artery ends in PICA. Skeleton: Cervical degenerative change. No acute skeletal abnormality. Other neck: Negative for mass or adenopathy. Upper chest: Negative Review of the MIP images confirms the above findings CTA HEAD FINDINGS Anterior circulation: Cavernous carotid is heavily calcified bilaterally with moderate stenosis right greater than left Moderate stenosis proximal right M1 segment. Moderate disease right middle cerebral artery bifurcation with moderate stenosis at the origin of the posterior division right MCA. Anterior division of the right middle cerebral artery irregular but patent. Mild stenosis left M1 segment. Moderate stenosis distal left M1. Left middle cerebral artery is patent with scattered atherosclerotic disease. Both anterior cerebral arteries patent Posterior circulation: Mild stenosis distal right vertebral artery which supplies the basilar. Left vertebral artery ends in PICA. PICA patent bilaterally. Basilar widely patent. Superior cerebellar arteries patent bilaterally. Fetal origin right posterior cerebral artery. Occlusion distal right posterior cerebral artery. Diffuse atherosclerotic disease left posterior cerebral artery. Venous sinuses: Patent Anatomic variants: None Delayed phase: Not perform Review of the MIP images confirms the above findings CT Brain Perfusion Findings: CBF (<30%) Volume: 281mPerfusion (Tmax>6.0s) volume: 3166mismatch Volume: 3mL36mfarction Location:Right MCA territory involving the right temporal lobe and right parietal lobe. CT perfusion volume is smaller than that seen on the recent head CT. This may be due to subacute infarct and pseudo normalization of the CT perfusion. Overall, the CT scan is felt to be more accurate representation of the large  territory right MCA infarct. IMPRESSION: Heavily calcified carotid bifurcation bilaterally with mild stenosis bilaterally. Heavily calcified moderate stenosis of the cavernous carotid bilaterally. Moderate stenosis proximal right M1 segment and distal right middle cerebral artery. Right middle cerebral arteries are diseased but patent. Mild left middle cerebral artery stenosis proximally and moderate stenosis distal left middle cerebral artery. Diffuse intracranial atherosclerotic disease with occlusion of the right posterior cerebral artery and diffuse disease in the left posterior cerebral artery. CT perfusion compatible with subacute infarct right MCA territory. CT perfusion under represents the size of the infarct based on CT Head compatible with subacute infarct and pseudo normalization. These results were reviewed in person at the time of interpretation on 07/23/2017 at 2:21 pm to Dr. PRAMAntony Bush verbally acknowledged these results. Electronically Signed   By: CharFranchot Gallo.   On: 07/23/2017 14:23  Ct Head Code Stroke Wo Contrast  Result Date: 07/23/2017 CLINICAL DATA:  Code stroke. 77 y/o M; fall yesterday, unable to walk with new onset today. EXAM: CT HEAD WITHOUT CONTRAST TECHNIQUE: Contiguous axial images were obtained from the base of the skull through the vertex without intravenous contrast. COMPARISON:  05/13/2013 CT head. FINDINGS: Brain: Large right MCA distribution late acute to subacute infarction involving the anterolateral temporal lobe, caudate head, putamen, and insula. Edema and local mass effect with partial effacement of the right lateral ventricle. Minimal right-sided uncal herniation. No significant midline shift. No hemorrhage. No additional area of infarction identified. Stable background of chronic microvascular ischemic changes and parenchymal volume loss of the brain. Vascular: Increased density of right distal M1 and proximal M2 branches may represent thrombus.  Calcific atherosclerosis of carotid siphons. Skull: Normal. Negative for fracture or focal lesion. Sinuses/Orbits: No acute finding. Other: Right-sided glaucoma device. ASPECTS [facility] Stroke Program Early CT Score) - Ganglionic level infarction (caudate, lentiform nuclei, internal capsule, insula, M1-M3 cortex): 1 - Supraganglionic infarction (M4-M6 cortex): 3 Total score (0-10 with 10 being normal): 4 IMPRESSION: 1. Right inferior MCA distribution late acute to subacute infarction involving temporal lobe, insula, and basal ganglia. Mass effect with partial effacement of right lateral ventricle and minimal right uncal herniation. No significant midline shift. No appreciable hemorrhage. 2. Density of right distal M1 and proximal M2 branches may represent thrombus. 3. ASPECTS is 4 These results were communicated to Dr. Leonie Man at [place] 2/21/2019by text page via the [facility] messaging system. Electronically Signed   By: Kristine Garbe M.D.   On: 07/23/2017 13:47       ROS; positive for fall, weakness, gaze deviation and all other systems negative Height 5' 11"  (1.803 m), weight 274 lb (124.3 kg). Physical Exam Obese elderly African-American male not in distress. . Afebrile. Head is nontraumatic. Neck is supple without bruit.    Cardiac exam no murmur or gallop. Lungs are clear to auscultation. Distal pulses are well felt. Neurological Exam :  Awake alert. Follows commands. Oriented to place and person. Dementia tension, registration and recall. Right gaze and head deviation. Able to cross the midline to the left but not all the way. Blinks to threat on the right but not the left. Follows midline and some left-sided commands. Mild left-sided neglect. Mild left hemiparesis with minimum drift on the left upper and lower extremities. Suspect diminished sensation on the left but sensory inattention. Deep tendon reflexes 2+ symmetric. Plantars downgoing. Gait not tested. Finger-to-nose and knee to heal  difficult to test on the left due to neglect.     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 He has presented outside time window for thrombolysis and is not a candidate for mechanical thrombectomy given his large baseline infarct. Recommend admit to medical team to step down. Stroke for evaluation with MRI scan the brain, echocardiogram, telemetry monitoring, lipid profile and hemoglobin A1c. Check speech therapy for swallow eval and physical and occupational therapy and rehabilitation consults. Dual therapy of aspirin and Plavix x 3 months due to intracranial MCA stenosis if patient is able to swallow safely. No family available at the present time for discussion. Long discussion with ER physician Dr. Pryor Curia about plan of care and answered questions. This patient is critically ill and at significant risk of neurological worsening, death and  care requires constant monitoring of vital signs, hemodynamics,respiratory and cardiac monitoring, extensive review of multiple databases, frequent neurological assessment, discussion with family, other specialists and medical decision making of high complexity.I have made any additions or clarifications directly to the above note.This critical care time does not reflect procedure time, or teaching time or supervisory time of PA/NP/Med Resident etc but could involve care discussion time.  I spent 30 minutes of neurocritical care time  in the care of  this patient.     Noah Contras 07/23/2017, 2:42 PM    Note: This document was prepared with digital dictation and possible smart phrase technology. Any transcriptional errors that result from this process are unintentional.

## 2017-07-23 NOTE — ED Triage Notes (Signed)
Pt arrived from home where family reports he had a fall yesterday with no problems. Family got patient up around 7 am stating he "was normal" and took pt to PCP where pt was able to walk in but found drooling and slurring speech with right sided gaze. PCP called 911.

## 2017-07-23 NOTE — ED Notes (Signed)
Family at bedside reports that they gave EMS some medications that pt was taking, No medications given to this RN

## 2017-07-23 NOTE — Progress Notes (Signed)
Pt has critical troponin result, denies any discomfort or pain.  MD on call was notified and no new orders were obtained but recommended that we check the level as already ordered and continue to monitor the patient.

## 2017-07-24 ENCOUNTER — Inpatient Hospital Stay (HOSPITAL_COMMUNITY): Payer: Medicare Other

## 2017-07-24 DIAGNOSIS — I63511 Cerebral infarction due to unspecified occlusion or stenosis of right middle cerebral artery: Principal | ICD-10-CM

## 2017-07-24 DIAGNOSIS — I639 Cerebral infarction, unspecified: Secondary | ICD-10-CM

## 2017-07-24 DIAGNOSIS — I5030 Unspecified diastolic (congestive) heart failure: Secondary | ICD-10-CM

## 2017-07-24 LAB — BASIC METABOLIC PANEL
Anion gap: 11 (ref 5–15)
Anion gap: 9 (ref 5–15)
BUN: 31 mg/dL — AB (ref 6–20)
BUN: 29 mg/dL — AB (ref 6–20)
CALCIUM: 9.4 mg/dL (ref 8.9–10.3)
CALCIUM: 9.2 mg/dL (ref 8.9–10.3)
CHLORIDE: 110 mmol/L (ref 101–111)
CO2: 17 mmol/L — AB (ref 22–32)
CO2: 19 mmol/L — ABNORMAL LOW (ref 22–32)
CREATININE: 1.65 mg/dL — AB (ref 0.61–1.24)
Chloride: 113 mmol/L — ABNORMAL HIGH (ref 101–111)
Creatinine, Ser: 1.62 mg/dL — ABNORMAL HIGH (ref 0.61–1.24)
GFR calc Af Amer: 45 mL/min — ABNORMAL LOW (ref >60)
GFR calc Af Amer: 46 mL/min — ABNORMAL LOW (ref >60)
GFR calc non Af Amer: 39 mL/min — ABNORMAL LOW (ref >60)
GFR, EST NON AFRICAN AMERICAN: 40 mL/min — AB (ref >60)
Glucose, Bld: 102 mg/dL — ABNORMAL HIGH (ref 65–99)
Glucose, Bld: 80 mg/dL (ref 65–99)
POTASSIUM: 5.6 mmol/L — AB (ref 3.5–5.1)
Potassium: 5.6 mmol/L — ABNORMAL HIGH (ref 3.5–5.1)
Sodium: 138 mmol/L (ref 135–145)
Sodium: 141 mmol/L (ref 135–145)

## 2017-07-24 LAB — GLUCOSE, CAPILLARY
GLUCOSE-CAPILLARY: 77 mg/dL (ref 65–99)
Glucose-Capillary: 86 mg/dL (ref 65–99)
Glucose-Capillary: 93 mg/dL (ref 65–99)
Glucose-Capillary: 70 mg/dL (ref 65–99)

## 2017-07-24 LAB — LIPID PANEL
CHOL/HDL RATIO: 5.5 ratio
CHOLESTEROL: 252 mg/dL — AB (ref 0–200)
HDL: 46 mg/dL (ref >40)
LDL Cholesterol: 185 mg/dL — ABNORMAL HIGH (ref 0–99)
TRIGLYCERIDES: 103 mg/dL (ref <150)
VLDL: 21 mg/dL (ref 0–40)

## 2017-07-24 LAB — CBC
HEMATOCRIT: 40.8 % (ref 39.0–52.0)
HEMOGLOBIN: 13.9 g/dL (ref 13.0–17.0)
MCH: 28.3 pg (ref 26.0–34.0)
MCHC: 34.1 g/dL (ref 30.0–36.0)
MCV: 83.1 fL (ref 78.0–100.0)
Platelets: 228 10*3/uL (ref 150–400)
RBC: 4.91 MIL/uL (ref 4.22–5.81)
RDW: 14.8 % (ref 11.5–15.5)
WBC: 8.2 10*3/uL (ref 4.0–10.5)

## 2017-07-24 LAB — RAPID URINE DRUG SCREEN, HOSP PERFORMED
AMPHETAMINES: NOT DETECTED
BENZODIAZEPINES: NOT DETECTED
Barbiturates: NOT DETECTED
Cocaine: NOT DETECTED
Opiates: NOT DETECTED
TETRAHYDROCANNABINOL: NOT DETECTED

## 2017-07-24 LAB — ECHOCARDIOGRAM COMPLETE
Height: 71 in
Weight: 4384 oz

## 2017-07-24 LAB — TSH: TSH: 2.026 u[IU]/mL (ref 0.350–4.500)

## 2017-07-24 LAB — HEMOGLOBIN A1C
HEMOGLOBIN A1C: 6.9 % — AB (ref 4.8–5.6)
MEAN PLASMA GLUCOSE: 151.33 mg/dL

## 2017-07-24 LAB — TROPONIN I
TROPONIN I: 0.16 ng/mL — AB (ref <0.03)
Troponin I: 0.14 ng/mL (ref <0.03)

## 2017-07-24 MED ORDER — ASPIRIN 300 MG RE SUPP
300.0000 mg | Freq: Every day | RECTAL | Status: DC
  Administered 2017-07-24 – 2017-07-25 (×2): 300 mg via RECTAL
  Filled 2017-07-24 (×2): qty 1

## 2017-07-24 MED ORDER — ALBUTEROL SULFATE (2.5 MG/3ML) 0.083% IN NEBU
2.5000 mg | INHALATION_SOLUTION | Freq: Once | RESPIRATORY_TRACT | Status: AC
  Administered 2017-07-24: 2.5 mg via RESPIRATORY_TRACT
  Filled 2017-07-24: qty 3

## 2017-07-24 MED ORDER — ATORVASTATIN CALCIUM 80 MG PO TABS
80.0000 mg | ORAL_TABLET | Freq: Every day | ORAL | Status: DC
  Administered 2017-07-25 – 2017-07-28 (×3): 80 mg via ORAL
  Filled 2017-07-24 (×5): qty 1

## 2017-07-24 MED ORDER — LORAZEPAM 2 MG/ML IJ SOLN
0.5000 mg | Freq: Once | INTRAMUSCULAR | Status: AC
  Administered 2017-07-24: 0.5 mg via INTRAVENOUS
  Filled 2017-07-24: qty 1

## 2017-07-24 MED ORDER — PERFLUTREN LIPID MICROSPHERE
1.0000 mL | INTRAVENOUS | Status: AC | PRN
Start: 2017-07-24 — End: 2017-07-24
  Administered 2017-07-24: 2 mL via INTRAVENOUS
  Filled 2017-07-24: qty 10

## 2017-07-24 MED ORDER — ASPIRIN EC 325 MG PO TBEC: 325.0000 mg | DELAYED_RELEASE_TABLET | Freq: Every day | ORAL | Status: DC

## 2017-07-24 NOTE — Progress Notes (Signed)
Pt is off the unit for MRI. He was unable to stay still the first time and he is rescheduled to go back after he has been medicated.

## 2017-07-24 NOTE — Plan of Care (Signed)
  Progressing Clinical Measurements: Will remain free from infection 07/24/2017 1126 - Progressing by Quentin CornwallMadison, Latreece Mochizuki, RN Diagnostic test results will improve 07/24/2017 1126 - Progressing by Quentin CornwallMadison, Jamileth Putzier, RN Respiratory complications will improve 07/24/2017 1126 - Progressing by Quentin CornwallMadison, Josemanuel Eakins, RN Cardiovascular complication will be avoided 07/24/2017 1126 - Progressing by Quentin CornwallMadison, Vidhi Delellis, RN Elimination: Will not experience complications related to bowel motility 07/24/2017 1126 - Progressing by Quentin CornwallMadison, Olamide Lahaie, RN Will not experience complications related to urinary retention 07/24/2017 1126 - Progressing by Quentin CornwallMadison, Alexandra Lipps, RN Pain Managment: General experience of comfort will improve 07/24/2017 1126 - Progressing by Quentin CornwallMadison, Sharmain Lastra, RN Safety: Ability to remain free from injury will improve 07/24/2017 1126 - Progressing by Quentin CornwallMadison, Lashaundra Lehrmann, RN Nutrition: Risk of aspiration will decrease 07/24/2017 1126 - Progressing by Quentin CornwallMadison, Tarus Briski, RN Nutrition: Risk of aspiration will decrease 07/24/2017 1126 - Progressing by Quentin CornwallMadison, Alveda Vanhorne, RN Intracerebral Hemorrhage Tissue Perfusion: Complications of Intracerebral Hemorrhage will be minimized 07/24/2017 1126 - Progressing by Quentin CornwallMadison, Jvion Turgeon, RN Ischemic Stroke/TIA Tissue Perfusion: Complications of ischemic stroke/TIA will be minimized 07/24/2017 1126 - Progressing by Quentin CornwallMadison, Macari Zalesky, RN   Not Progressing Education: Knowledge of General Education information will improve 07/24/2017 1126 - Not Progressing by Quentin CornwallMadison, Macenzie Burford, RN Health Behavior/Discharge Planning: Ability to manage health-related needs will improve 07/24/2017 1126 - Not Progressing by Quentin CornwallMadison, Amaya Blakeman, RN Clinical Measurements: Ability to maintain clinical measurements within normal limits will improve 07/24/2017 1126 - Not Progressing by Quentin CornwallMadison, Gatha Mcnulty, RN Activity: Risk for activity intolerance will decrease 07/24/2017 1126 - Not Progressing by Quentin CornwallMadison, Ariahna Smiddy,  RN Nutrition: Adequate nutrition will be maintained 07/24/2017 1126 - Not Progressing by Quentin CornwallMadison, Reeda Soohoo, RN Coping: Level of anxiety will decrease 07/24/2017 1126 - Not Progressing by Quentin CornwallMadison, Raegan Sipp, RN Skin Integrity: Risk for impaired skin integrity will decrease 07/24/2017 1126 - Not Progressing by Quentin CornwallMadison, Hristopher Missildine, RN Education: Knowledge of disease or condition will improve 07/24/2017 1126 - Not Progressing by Quentin CornwallMadison, Gricelda Foland, RN Knowledge of secondary prevention will improve 07/24/2017 1126 - Not Progressing by Quentin CornwallMadison, Grete Bosko, RN Knowledge of patient specific risk factors addressed and post discharge goals established will improve 07/24/2017 1126 - Not Progressing by Quentin CornwallMadison, Xavi Tomasik, RN Coping: Will verbalize positive feelings about self 07/24/2017 1126 - Not Progressing by Quentin CornwallMadison, Jerrin Recore, RN Will identify appropriate support needs 07/24/2017 1126 - Not Progressing by Quentin CornwallMadison, Aquanetta Schwarz, RN Health Behavior/Discharge Planning: Ability to manage health-related needs will improve 07/24/2017 1126 - Not Progressing by Quentin CornwallMadison, Clancy Mullarkey, RN Self-Care: Ability to participate in self-care as condition permits will improve 07/24/2017 1126 - Not Progressing by Quentin CornwallMadison, Vannia Pola, RN Ability to communicate needs accurately will improve 07/24/2017 1126 - Not Progressing by Quentin CornwallMadison, Diaz Crago, RN Nutrition: Dietary intake will improve 07/24/2017 1126 - Not Progressing by Quentin CornwallMadison, Hao Dion, RN

## 2017-07-24 NOTE — Progress Notes (Addendum)
Family Medicine Teaching Service Daily Progress Note Intern Pager: [phone]  Patient name: Noah Bush. Medical record number: [id number] Date of birth: 1940/06/25 Age: 77 y.o. Gender: male  Primary Care Provider: Patient, No Pcp Per Consultants: Neurology  Code Status: Full   Pt Overview and Major Events to Date:  Admitted to FPTS on 07/23/17  Assessment and Plan: Noah Bush. is a 77 y.o. male presenting as a code stroke with difficulty walking, drooling, and facial droop. PMH is significant for HTN, Alzheimer dementia, CKD III, and T2DM  Subacute R MCA Infarct Last known normal unclear, best guess by family was yesterday. On exam, patient with dysarthric speech, L sided weakness, and questionable L sided sensory deficits. Neurology consulted, recommend risk stratification labs, rehabilitation, and ASA + Plavix dual therapy when passed swallow study. MRI brain showing large acute on subacute right MCA infarct with petechial hemorrhage, right cerebrum mass effect without midline shift, mildly effaced right ventricle without left ventricle entrapment. UDS negative, ethanol <10. A1C of 6.9. Lipid panel showing elevated cholesterol of 252 with LDL elevated to 185. TSH wnl at 2.026. Echocardiogram showing mild diffuse hypokinesis with distinct regional wall motion abnormalities. LVEF 40-45%. -neurology consulted, appreciate recommendations  -plan for DAPT with ASA and clopidigrel x 3 mo pending passing swallow evaluation   -permissive HTN for 24-48 hrs -SLP, PT, and OT consults placed. Patient has active bed rest order and may get up with assistance.  -NS @ 125 cc/hr  Elevated Troponin Initial istat troponin elevated to 0.27, with trended troponin elevated to 0.16>0.16. EKG on 07/23/17 in NSR and known LBBB, with am EKG showing similar findings.  -Trend troponins  Hyperkalemia Unclear etiology at this time, possibly related to medications. Urinary retention also a possible etiology  given patient has a prescription for flomax 0.4mg  daily (that has not filled since December 2018) and doxazosin 4mg  daily (per [facility]). K 5.6 on admission, current K 5.6. Patient denies symptoms and EKG without significant changes.  -IVF as above -will administer 1 albuterol nebulizer treatment as patient was also having an increased O2 need and re-check K in the afternoon   Type 2 Diabetes A1C on 07/24/17 of 6.9 patient on one home oral med but family unable to recall. Per discussion with [facility] home medications include: glipizide ER 2.5mg  daily  -sSSI and CBGs  HTN Currently normotensive with BP of 127/71. Unsure of home meds; patient has not been taking any meds for 5 days and family reports BP was "very high" at last clinic visit. Per discuss with [facility] home medications include: lisinopril-HCTZ 20/12.5 daily -permissive HTN after 24-48 hours; plan to resume home antihypertensives morning of 2/23  CKD III Current Cr 1.65. Per chart review, baseline likely Cr ~1.6-1.8.   -avoid nephrotoxic agents  Alzheimer's dementia Per discussion with [facility], medications are as follows: memantine daily   FEN/GI: NPO until formal speech eval; NS @ 125 cc/hr Prophylaxis: SCDs until MRI confirms no hemorrhagic element to stroke  Disposition: continued inpatient stay   Subjective:  Patient very difficult to wake. While awake patient able to answer some questions but refused to answer most.   Objective: Temp:  [98.1 F (36.7 C)-99.2 F (37.3 C)] 98.4 F (36.9 C) (02/22 0930) Pulse Rate:  [67-77] 67 (02/22 0930) Resp:  [12-20] 18 (02/22 0930) BP: (108-159)/(60-82) 129/72 (02/22 0930) SpO2:  [85 %-100 %] 98 % (02/22 1023) Weight:  [274 lb (124.3 kg)] 274 lb (124.3 kg) (02/21 1425) Physical Exam:  General: asleep, difficult to wake requiring sternal rub, NAD Cardiovascular: RRR, no MRG Respiratory: CTAB, no wheezes, rales or rhonchi, snorning  Abdomen: soft, non  tender, non distended, bowel sounds normal  Extremities: no edema, L sided weakness in upper and lower extremities  Neuro: decreased sensation in left side, left sided facial droop, slurred speech, oriented to person and place   Laboratory: Recent Labs  Lab 07/23/17 1325 07/23/17 1334 07/24/17 0546  WBC 7.6  --  8.2  HGB 14.2 15.3 13.9  HCT 41.2 45.0 40.8  PLT 249  --  228   Recent Labs  Lab 07/23/17 1325 07/23/17 1334 07/24/17 0546  NA 135 138 138  K 5.6* 5.6* 5.6*  CL 108 108 110  CO2 17*  --  17*  BUN 39* 39* 31*  CREATININE 1.77* 1.60* 1.65*  CALCIUM 9.6  --  9.4  PROT 7.7  --   --   BILITOT 0.7  --   --   ALKPHOS 50  --   --   ALT 15*  --   --   AST 20  --   --   GLUCOSE 107* 103* 102*     Ref. Range 07/24/2017 07:22  TSH Latest Ref Range: 0.350 - 4.500 uIU/mL 2.026    Ref. Range 07/24/2017 05:46  Glucose Latest Ref Range: 65 - 99 mg/dL 161 (H)  Hemoglobin W9U Latest Ref Range: 4.8 - 5.6 % 6.9 (H)    Ref. Range 07/24/2017 05:46  Total CHOL/HDL Ratio Latest Units: RATIO 5.5  Cholesterol Latest Ref Range: 0 - 200 mg/dL 045 (H)  HDL Cholesterol Latest Ref Range: >40 mg/dL 46  LDL (calc) Latest Ref Range: 0 - 99 mg/dL 409 (H)  Triglycerides Latest Ref Range: <150 mg/dL 811  VLDL Latest Ref Range: 0 - 40 mg/dL 21    Ref. Range 07/23/2017 21:46 07/24/2017 05:46  Troponin I Latest Ref Range: <0.03 ng/mL 0.16 (HH) 0.16 (HH)   Imaging/Diagnostic Tests: Ct Angio Head W Or Wo Contrast  Result Date: 07/23/2017 CLINICAL DATA:  Stroke. EXAM: CT ANGIOGRAPHY HEAD AND NECK CT PERFUSION BRAIN TECHNIQUE: Multidetector CT imaging of the head and neck was performed using the standard protocol during bolus administration of intravenous contrast. Multiplanar CT image reconstructions and MIPs were obtained to evaluate the vascular anatomy. Carotid stenosis measurements (when applicable) are obtained utilizing NASCET criteria, using the distal internal carotid diameter as the  denominator. Multiphase CT imaging of the brain was performed following IV bolus contrast injection. Subsequent parametric perfusion maps were calculated using RAPID software. CONTRAST:  90mL ISOVUE-370 IOPAMIDOL (ISOVUE-370) INJECTION 76% COMPARISON:  CT head 07/23/2017 FINDINGS: CTA NECK FINDINGS Aortic arch: Atherosclerotic disease in the aortic arch. 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 Ascending aorta measures 37 mm in diameter. Atherosclerotic disease in the proximal great vessels which are patent. Right carotid system: Calcified plaque right carotid bifurcation. Less than 25% diameter stenosis right internal carotid artery at the origin. Left carotid system: Atherosclerotic disease at the proximal left common carotid artery which is mildly narrowed. Atherosclerotic calcification left carotid bifurcation with less than 25% diameter stenosis. Diffusely diseased left common carotid artery Vertebral arteries: Right vertebral artery dominant and patent to the basilar. Calcified plaque at the origin. Small left vertebral artery ends in PICA. Skeleton: Cervical degenerative change. No acute skeletal abnormality. Other neck: Negative for mass or adenopathy. Upper chest: Negative Review of the MIP images confirms the above findings CTA HEAD FINDINGS Anterior circulation: Cavernous carotid is heavily calcified  bilaterally with moderate stenosis right greater than left Moderate stenosis proximal right M1 segment. Moderate disease right middle cerebral artery bifurcation with moderate stenosis at the origin of the posterior division right MCA. Anterior division of the right middle cerebral artery irregular but patent. Mild stenosis left M1 segment. Moderate stenosis distal left M1. Left middle cerebral artery is patent with scattered atherosclerotic disease. Both anterior cerebral arteries patent Posterior circulation: Mild stenosis distal right vertebral artery which supplies the basilar. Left vertebral artery  ends in PICA. PICA patent bilaterally. Basilar widely patent. Superior cerebellar arteries patent bilaterally. Fetal origin right posterior cerebral artery. Occlusion distal right posterior cerebral artery. Diffuse atherosclerotic disease left posterior cerebral artery. Venous sinuses: Patent Anatomic variants: None Delayed phase: Not perform Review of the MIP images confirms the above findings CT Brain Perfusion Findings: CBF (<30%) Volume: 28mL Perfusion (Tmax>6.0s) volume: 31mL Mismatch Volume: 3mL Infarction Location:Right MCA territory involving the right temporal lobe and right parietal lobe. CT perfusion volume is smaller than that seen on the recent head CT. This may be due to subacute infarct and pseudo normalization of the CT perfusion. Overall, the CT scan is felt to be more accurate representation of the large territory right MCA infarct. IMPRESSION: Heavily calcified carotid bifurcation bilaterally with mild stenosis bilaterally. Heavily calcified moderate stenosis of the cavernous carotid bilaterally. Moderate stenosis proximal right M1 segment and distal right middle cerebral artery. Right middle cerebral arteries are diseased but patent. Mild left middle cerebral artery stenosis proximally and moderate stenosis distal left middle cerebral artery. Diffuse intracranial atherosclerotic disease with occlusion of the right posterior cerebral artery and diffuse disease in the left posterior cerebral artery. CT perfusion compatible with subacute infarct right MCA territory. CT perfusion under represents the size of the infarct based on CT Head compatible with subacute infarct and pseudo normalization. These results were reviewed in person at the time of interpretation on 07/23/2017 at 2:21 pm to Dr. Delia Heady , who verbally acknowledged these results. Electronically Signed   By: Marlan Palau M.D.   On: 07/23/2017 14:23   Ct Angio Neck W Or Wo Contrast  Result Date: 07/23/2017 CLINICAL DATA:  Stroke.  EXAM: CT ANGIOGRAPHY HEAD AND NECK CT PERFUSION BRAIN TECHNIQUE: Multidetector CT imaging of the head and neck was performed using the standard protocol during bolus administration of intravenous contrast. Multiplanar CT image reconstructions and MIPs were obtained to evaluate the vascular anatomy. Carotid stenosis measurements (when applicable) are obtained utilizing NASCET criteria, using the distal internal carotid diameter as the denominator. Multiphase CT imaging of the brain was performed following IV bolus contrast injection. Subsequent parametric perfusion maps were calculated using RAPID software. CONTRAST:  90mL ISOVUE-370 IOPAMIDOL (ISOVUE-370) INJECTION 76% COMPARISON:  CT head 07/23/2017 FINDINGS: CTA NECK FINDINGS Aortic arch: Atherosclerotic disease in the aortic arch. 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 Ascending aorta measures 37 mm in diameter. Atherosclerotic disease in the proximal great vessels which are patent. Right carotid system: Calcified plaque right carotid bifurcation. Less than 25% diameter stenosis right internal carotid artery at the origin. Left carotid system: Atherosclerotic disease at the proximal left common carotid artery which is mildly narrowed. Atherosclerotic calcification left carotid bifurcation with less than 25% diameter stenosis. Diffusely diseased left common carotid artery Vertebral arteries: Right vertebral artery dominant and patent to the basilar. Calcified plaque at the origin. Small left vertebral artery ends in PICA. Skeleton: Cervical degenerative change. No acute skeletal abnormality. Other neck: Negative for mass or adenopathy. Upper chest: Negative Review  of the MIP images confirms the above findings CTA HEAD FINDINGS Anterior circulation: Cavernous carotid is heavily calcified bilaterally with moderate stenosis right greater than left Moderate stenosis proximal right M1 segment. Moderate disease right middle cerebral artery bifurcation with  moderate stenosis at the origin of the posterior division right MCA. Anterior division of the right middle cerebral artery irregular but patent. Mild stenosis left M1 segment. Moderate stenosis distal left M1. Left middle cerebral artery is patent with scattered atherosclerotic disease. Both anterior cerebral arteries patent Posterior circulation: Mild stenosis distal right vertebral artery which supplies the basilar. Left vertebral artery ends in PICA. PICA patent bilaterally. Basilar widely patent. Superior cerebellar arteries patent bilaterally. Fetal origin right posterior cerebral artery. Occlusion distal right posterior cerebral artery. Diffuse atherosclerotic disease left posterior cerebral artery. Venous sinuses: Patent Anatomic variants: None Delayed phase: Not perform Review of the MIP images confirms the above findings CT Brain Perfusion Findings: CBF (<30%) Volume: 28mL Perfusion (Tmax>6.0s) volume: 31mL Mismatch Volume: 3mL Infarction Location:Right MCA territory involving the right temporal lobe and right parietal lobe. CT perfusion volume is smaller than that seen on the recent head CT. This may be due to subacute infarct and pseudo normalization of the CT perfusion. Overall, the CT scan is felt to be more accurate representation of the large territory right MCA infarct. IMPRESSION: Heavily calcified carotid bifurcation bilaterally with mild stenosis bilaterally. Heavily calcified moderate stenosis of the cavernous carotid bilaterally. Moderate stenosis proximal right M1 segment and distal right middle cerebral artery. Right middle cerebral arteries are diseased but patent. Mild left middle cerebral artery stenosis proximally and moderate stenosis distal left middle cerebral artery. Diffuse intracranial atherosclerotic disease with occlusion of the right posterior cerebral artery and diffuse disease in the left posterior cerebral artery. CT perfusion compatible with subacute infarct right MCA  territory. CT perfusion under represents the size of the infarct based on CT Head compatible with subacute infarct and pseudo normalization. These results were reviewed in person at the time of interpretation on 07/23/2017 at 2:21 pm to Dr. Delia Heady , who verbally acknowledged these results. Electronically Signed   By: Marlan Palau M.D.   On: 07/23/2017 14:23   Mr Brain Wo Contrast  Result Date: 07/24/2017 CLINICAL DATA:  Follow-up code stroke. Subacute RIGHT MCA infarct. Difficulty walking, facial droop. History of Alzheimer's disease, advanced intracranial atherosclerosis. EXAM: MRI HEAD WITHOUT CONTRAST TECHNIQUE: Multiplanar, multiecho pulse sequences of the brain and surrounding structures were obtained without intravenous contrast. COMPARISON:  CT HEAD July 23, 2017 FINDINGS: INTRACRANIAL CONTENTS: Reduced diffusion and susceptibility artifact RIGHT frontotemporal parietal cortex and, RIGHT basal ganglia with patchy low ADC values RIGHT lenticulostriate nucleus, superior aspect RIGHT frontotemporal parietal cortex. Regional mass effect without midline shift. Mild effacement RIGHT lateral ventricle without LEFT ventricle entrapment. Patchy to confluent supratentorial white matter FLAIR T2 hyperintensities exclusive a aforementioned abnormality. VASCULAR: Normal major intracranial vascular flow voids present at skull base. SKULL AND UPPER CERVICAL SPINE: No abnormal sellar expansion. No suspicious calvarial bone marrow signal. Craniocervical junction maintained. SINUSES/ORBITS: Trace paranasal sinus mucosal thickening. Mastoid air cells are well aerated. Included ocular globes and orbital contents are non-suspicious. OTHER: None. IMPRESSION: 1. Large acute on subacute RIGHT MCA territory infarct with petechial hemorrhage. 2. Regional RIGHT cerebrum mass effect without midline shift. Mildly effaced RIGHT lateral ventricle without LEFT ventricle entrapment. 3. Moderate chronic small vessel ischemic  disease. Electronically Signed   By: Awilda Metro M.D.   On: 07/24/2017 05:19   Ct Abdomen Pelvis W  Contrast  Result Date: 06/29/2017 CLINICAL DATA:  Lower abdominal pain. EXAM: CT ABDOMEN AND PELVIS WITH CONTRAST TECHNIQUE: Multidetector CT imaging of the abdomen and pelvis was performed using the standard protocol following bolus administration of intravenous contrast. CONTRAST:  100mL ISOVUE-300 IOPAMIDOL (ISOVUE-300) INJECTION 61% COMPARISON:  01/29/2013 FINDINGS: Lower chest: Linear bibasilar scarring. No effusions. Coronary artery calcifications. Heart is borderline in size. Hepatobiliary: Layering gallstones within the gallbladder. A small hypodensity in the right hepatic lobe is stable since prior study and likely reflects a small cyst. No biliary ductal dilatation. Pancreas: No focal abnormality or ductal dilatation. Spleen: No focal abnormality.  Normal size. Adrenals/Urinary Tract: Several right renal cysts. No hydronephrosis bilaterally. Adrenal glands and urinary bladder unremarkable. Stomach/Bowel: Sigmoid diverticulosis. No active diverticulitis. Appendix is normal. Stomach and small bowel decompressed, unremarkable. Vascular/Lymphatic: Diffuse aortic and iliac calcifications. No aneurysm or adenopathy. Reproductive: Central prostate calcifications. Other: No free fluid or free air. Musculoskeletal: No acute bony abnormality. Chronic appearing compression fractures at T12 and L1. IMPRESSION: Bibasilar scarring. Layering high-density material within the gallbladder, likely small stones. Sigmoid diverticulosis.  No active diverticulitis. Aortoiliac atherosclerosis. No acute findings in the abdomen or pelvis. Electronically Signed   By: Charlett NoseKevin  Dover M.D.   On: 06/29/2017 22:26   Ct Cerebral Perfusion W Contrast  Result Date: 07/23/2017 CLINICAL DATA:  Stroke. EXAM: CT ANGIOGRAPHY HEAD AND NECK CT PERFUSION BRAIN TECHNIQUE: Multidetector CT imaging of the head and neck was performed using the  standard protocol during bolus administration of intravenous contrast. Multiplanar CT image reconstructions and MIPs were obtained to evaluate the vascular anatomy. Carotid stenosis measurements (when applicable) are obtained utilizing NASCET criteria, using the distal internal carotid diameter as the denominator. Multiphase CT imaging of the brain was performed following IV bolus contrast injection. Subsequent parametric perfusion maps were calculated using RAPID software. CONTRAST:  90mL ISOVUE-370 IOPAMIDOL (ISOVUE-370) INJECTION 76% COMPARISON:  CT head 07/23/2017 FINDINGS: CTA NECK FINDINGS Aortic arch: Atherosclerotic disease in the aortic arch. 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 Ascending aorta measures 37 mm in diameter. Atherosclerotic disease in the proximal great vessels which are patent. Right carotid system: Calcified plaque right carotid bifurcation. Less than 25% diameter stenosis right internal carotid artery at the origin. Left carotid system: Atherosclerotic disease at the proximal left common carotid artery which is mildly narrowed. Atherosclerotic calcification left carotid bifurcation with less than 25% diameter stenosis. Diffusely diseased left common carotid artery Vertebral arteries: Right vertebral artery dominant and patent to the basilar. Calcified plaque at the origin. Small left vertebral artery ends in PICA. Skeleton: Cervical degenerative change. No acute skeletal abnormality. Other neck: Negative for mass or adenopathy. Upper chest: Negative Review of the MIP images confirms the above findings CTA HEAD FINDINGS Anterior circulation: Cavernous carotid is heavily calcified bilaterally with moderate stenosis right greater than left Moderate stenosis proximal right M1 segment. Moderate disease right middle cerebral artery bifurcation with moderate stenosis at the origin of the posterior division right MCA. Anterior division of the right middle cerebral artery irregular but  patent. Mild stenosis left M1 segment. Moderate stenosis distal left M1. Left middle cerebral artery is patent with scattered atherosclerotic disease. Both anterior cerebral arteries patent Posterior circulation: Mild stenosis distal right vertebral artery which supplies the basilar. Left vertebral artery ends in PICA. PICA patent bilaterally. Basilar widely patent. Superior cerebellar arteries patent bilaterally. Fetal origin right posterior cerebral artery. Occlusion distal right posterior cerebral artery. Diffuse atherosclerotic disease left posterior cerebral artery. Venous sinuses: Patent Anatomic variants:  None Delayed phase: Not perform Review of the MIP images confirms the above findings CT Brain Perfusion Findings: CBF (<30%) Volume: 28mL Perfusion (Tmax>6.0s) volume: 31mL Mismatch Volume: 3mL Infarction Location:Right MCA territory involving the right temporal lobe and right parietal lobe. CT perfusion volume is smaller than that seen on the recent head CT. This may be due to subacute infarct and pseudo normalization of the CT perfusion. Overall, the CT scan is felt to be more accurate representation of the large territory right MCA infarct. IMPRESSION: Heavily calcified carotid bifurcation bilaterally with mild stenosis bilaterally. Heavily calcified moderate stenosis of the cavernous carotid bilaterally. Moderate stenosis proximal right M1 segment and distal right middle cerebral artery. Right middle cerebral arteries are diseased but patent. Mild left middle cerebral artery stenosis proximally and moderate stenosis distal left middle cerebral artery. Diffuse intracranial atherosclerotic disease with occlusion of the right posterior cerebral artery and diffuse disease in the left posterior cerebral artery. CT perfusion compatible with subacute infarct right MCA territory. CT perfusion under represents the size of the infarct based on CT Head compatible with subacute infarct and pseudo normalization.  These results were reviewed in person at the time of interpretation on 07/23/2017 at 2:21 pm to Dr. Delia Heady , who verbally acknowledged these results. Electronically Signed   By: Marlan Palau M.D.   On: 07/23/2017 14:23   Dg Chest Port 1 View  Result Date: 07/23/2017 CLINICAL DATA:  Slurred speech. EXAM: PORTABLE CHEST 1 VIEW COMPARISON:  04/04/2013 FINDINGS: 1616 hours. Low lung volumes with lordotic positioning. The cardio pericardial silhouette is enlarged. No overt pulmonary edema. Interstitial markings are diffusely coarsened with chronic features. No focal lung consolidation or substantial pleural effusion. The visualized bony structures of the thorax are intact. Telemetry leads overlie the chest. IMPRESSION: Cardiomegaly without acute cardiopulmonary findings. Electronically Signed   By: Kennith Center M.D.   On: 07/23/2017 16:32   Ct Head Code Stroke Wo Contrast  Result Date: 07/23/2017 CLINICAL DATA:  Code stroke. 77 y/o M; fall yesterday, unable to walk with new onset today. EXAM: CT HEAD WITHOUT CONTRAST TECHNIQUE: Contiguous axial images were obtained from the base of the skull through the vertex without intravenous contrast. COMPARISON:  05/13/2013 CT head. FINDINGS: Brain: Large right MCA distribution late acute to subacute infarction involving the anterolateral temporal lobe, caudate head, putamen, and insula. Edema and local mass effect with partial effacement of the right lateral ventricle. Minimal right-sided uncal herniation. No significant midline shift. No hemorrhage. No additional area of infarction identified. Stable background of chronic microvascular ischemic changes and parenchymal volume loss of the brain. Vascular: Increased density of right distal M1 and proximal M2 branches may represent thrombus. Calcific atherosclerosis of carotid siphons. Skull: Normal. Negative for fracture or focal lesion. Sinuses/Orbits: No acute finding. Other: Right-sided glaucoma device. ASPECTS  [facility] Stroke Program Early CT Score) - Ganglionic level infarction (caudate, lentiform nuclei, internal capsule, insula, M1-M3 cortex): 1 - Supraganglionic infarction (M4-M6 cortex): 3 Total score (0-10 with 10 being normal): 4 IMPRESSION: 1. Right inferior MCA distribution late acute to subacute infarction involving temporal lobe, insula, and basal ganglia. Mass effect with partial effacement of right lateral ventricle and minimal right uncal herniation. No significant midline shift. No appreciable hemorrhage. 2. Density of right distal M1 and proximal M2 branches may represent thrombus. 3. ASPECTS is 4 These results were communicated to Dr. Pearlean Brownie at 1:46 pmon 2/21/2019by text page via the [facility] messaging system. Electronically Signed   By: Buzzy Han.D.  On: 07/23/2017 13:47   Echo: Study Conclusions - Left ventricle: The cavity size was normal. There was mild   concentric hypertrophy. Systolic function was mildly to   moderately reduced. The estimated ejection fraction was in the   range of 40% to 45%. Mild diffuse hypokinesis with distinct   regional wall motion abnormalities. Possible disproportionately   severe hypokinesis of the basal-mid inferior and inferoseptal   myocardium. Doppler parameters are consistent with abnormal left   ventricular relaxation (grade 1 diastolic dysfunction). Acoustic   contrast opacification revealed no evidence ofthrombus. - Ventricular septum: Septal motion showed paradox. These changes   are consistent with a left bundle branch block. - Left atrium: The atrium was mildly dilated. Prepared and Electronically Authenticated by Thurmon Fair, MD 2019-02-22T10:23:48   Oralia Manis, DO 07/24/2017, 11:06 AM PGY-1, [facility] FPTS Intern pager: [phone], text pages welcome

## 2017-07-24 NOTE — Discharge Summary (Signed)
Family Medicine Teaching Service [facility] Discharge Summary  Patient name: Noah Bush. Medical record number: [id number] Date of birth: May 15, 1941 Age: 77 y.o. Gender: male Date of Admission: 07/23/2017  Date of Discharge: 07/28/2017 Admitting Physician: Carney Living, MD  Primary Care Provider: Patient, No Pcp Per Consultants: Neurology   Indication for Hospitalization: R MCA Infarct   Discharge Diagnoses/Problem List:  Subacute R MCA Infarct  Elevated troponin  Hyperkalemia Type 2 DM HTN CKD III  Disposition: CIR  Discharge Condition: stable, improving  Discharge Exam:  General: awake, alert, oriented to person and time, not to place (states he is in store and when reoriented states "I'm still in [place]?")  Eyes: PERRL Cardiovascular: RRR, no MRG  Respiratory: CTAB, no wheezes, rales, or rhonchi  Abdomen: soft, non tender, non distended, bowel sounds normal  Extremities: non tender, no edema, 5/5 muscle strength in RUE, 2/5 strength in LUE, 2/5 muscle strength in lower extremities bilaterally  Neuro: oriented to person and time. Sensation intact bilaterally. Able to follow most commands (chose not to follow some commands)     Brief Hospital Course:  Noah Sehgal. is a 77 y.o. male presenting with new onset weakness and drooling. Unclear of timing of symptoms but per family was at baseline at 7am the morning of arrival to ED. CT with large subacute R posterior MCA infarct with mass effect and partial R ventricle effacement (although no midline shift) and CT angio with diffuse moderate to severe intracranial atherosclerotic disease (including heavily calcified carotids, L PCA, distal R MCA), R PCA occlusion, compatible with subacute infarct of R MCA territory. MRI brain showing large acute on subacute right MCA infarct with petechial hemorrhage, right cerebrum mass effect without midline shift, mildly effaced right ventricle without left ventricle entrapment. UDS  negative, ethanol <10. Risk stratification labs show lipid panel showing elevated cholesterol of 252 and LDL of 185, A1C of 6.9, and TSH of 2.026. Neurology consulted and recommendation of 30 day event monitor, dual antiplatelet therapy with ASA and plavix for 3 months and then changing to plavix only.   During admission troponins also elevated at trended at 0.16>0.16>0.14. EKG showing similar findings to previous EKGs.   During admission patient found to be hyperkalemic to 5.6, EKG showing no significant changes. K of 5.1 on discharge.   Issues for Follow Up:  1. Change in BP meds: have stopped lisinopril-HCTZ. Have started metoprolol 12.5mg  daily. Consider the addition of ACE inhibitor or ARB. 2. Recommend adding ACE if BP tolerates given new HFmrEF diagnosis 3. Cardiology consulted for 30 day event monitor. Will call patient to have placed 4. Dual antiplatelet tx x 3 months (ASA and plavix). After 3 months continue plavix only  5. HCTZ stopped on discharge  6. Started on atorvastatin 80mg  daily   Significant Procedures: none   Significant Labs and Imaging:  Recent Labs  Lab 07/23/17 1325 07/23/17 1334 07/24/17 0546 07/25/17 0314  WBC 7.6  --  8.2 8.2  HGB 14.2 15.3 13.9 12.5*  HCT 41.2 45.0 40.8 37.8*  PLT 249  --  228 222   Recent Labs  Lab 07/23/17 1325  07/24/17 1416 07/25/17 0314 07/26/17 0537 07/27/17 0352 07/28/17 0531  NA 135   < > 141 139 142 142 143  K 5.6*   < > 5.6* 5.2* 5.3* 4.7 5.1  CL 108   < > 113* 115* 114* 115* 115*  CO2 17*   < > 19* 12* 19* 19* 19*  GLUCOSE  107*   < > 80 79 92 114* 117*  BUN 39*   < > 29* 27* 18 18 19   CREATININE 1.77*   < > 1.62* 1.51* 1.37* 1.36* 1.39*  CALCIUM 9.6   < > 9.2 8.8* 9.1 9.1 9.0  ALKPHOS 50  --   --   --   --   --   --   AST 20  --   --   --   --   --   --   ALT 15*  --   --   --   --   --   --   ALBUMIN 3.9  --   --   --   --   --   --    < > = values in this interval not displayed.    Ref. Range 07/24/2017 07:22   TSH Latest Ref Range: 0.350 - 4.500 uIU/mL 2.026    Ref. Range 07/23/2017 16:38  Alcohol, Ethyl (B) Latest Ref Range: <10 mg/dL <16    Ref. Range 07/23/2017 21:46 07/24/2017 05:46 07/24/2017 10:15  Troponin I Latest Ref Range: <0.03 ng/mL 0.16 (HH) 0.16 (HH) 0.14 (HH)    Ref. Range 07/24/2017 05:46  Hemoglobin A1C Latest Ref Range: 4.8 - 5.6 % 6.9 (H)    Ref. Range 07/24/2017 05:46  Total CHOL/HDL Ratio Latest Units: RATIO 5.5  Cholesterol Latest Ref Range: 0 - 200 mg/dL 109 (H)  HDL Cholesterol Latest Ref Range: >40 mg/dL 46  LDL (calc) Latest Ref Range: 0 - 99 mg/dL 604 (H)  Triglycerides Latest Ref Range: <150 mg/dL 540  VLDL Latest Ref Range: 0 - 40 mg/dL 21   Echo: Study Conclusions - Left ventricle: The cavity size was normal. There was mild concentric hypertrophy. Systolic function was mildly to moderately reduced. The estimated ejection fraction was in the range of 40% to 45%. Mild diffuse hypokinesis with distinct regional wall motion abnormalities. Possible disproportionately severe hypokinesis of the basal-mid inferior and inferoseptal myocardium. Doppler parameters are consistent with abnormal left ventricular relaxation (grade 1 diastolic dysfunction). Acoustic contrast opacification revealed no evidence ofthrombus. - Ventricular septum: Septal motion showed paradox. These changes are consistent with a left bundle branch block. - Left atrium: The atrium was mildly dilated. Prepared and Electronically Authenticated by Thurmon Fair, MD 2019-02-22T10:23:48  Ct Angio Head W Or Wo Contrast  Result Date: 07/23/2017 CLINICAL DATA:  Stroke. EXAM: CT ANGIOGRAPHY HEAD AND NECK CT PERFUSION BRAIN TECHNIQUE: Multidetector CT imaging of the head and neck was performed using the standard protocol during bolus administration of intravenous contrast. Multiplanar CT image reconstructions and MIPs were obtained to evaluate the vascular anatomy. Carotid stenosis  measurements (when applicable) are obtained utilizing NASCET criteria, using the distal internal carotid diameter as the denominator. Multiphase CT imaging of the brain was performed following IV bolus contrast injection. Subsequent parametric perfusion maps were calculated using RAPID software. CONTRAST:  90mL ISOVUE-370 IOPAMIDOL (ISOVUE-370) INJECTION 76% COMPARISON:  CT head 07/23/2017 FINDINGS: CTA NECK FINDINGS Aortic arch: Atherosclerotic disease in the aortic arch. 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 Ascending aorta measures 37 mm in diameter. Atherosclerotic disease in the proximal great vessels which are patent. Right carotid system: Calcified plaque right carotid bifurcation. Less than 25% diameter stenosis right internal carotid artery at the origin. Left carotid system: Atherosclerotic disease at the proximal left common carotid artery which is mildly narrowed. Atherosclerotic calcification left carotid bifurcation with less than 25% diameter stenosis. Diffusely diseased left common carotid artery Vertebral  arteries: Right vertebral artery dominant and patent to the basilar. Calcified plaque at the origin. Small left vertebral artery ends in PICA. Skeleton: Cervical degenerative change. No acute skeletal abnormality. Other neck: Negative for mass or adenopathy. Upper chest: Negative Review of the MIP images confirms the above findings CTA HEAD FINDINGS Anterior circulation: Cavernous carotid is heavily calcified bilaterally with moderate stenosis right greater than left Moderate stenosis proximal right M1 segment. Moderate disease right middle cerebral artery bifurcation with moderate stenosis at the origin of the posterior division right MCA. Anterior division of the right middle cerebral artery irregular but patent. Mild stenosis left M1 segment. Moderate stenosis distal left M1. Left middle cerebral artery is patent with scattered atherosclerotic disease. Both anterior cerebral arteries  patent Posterior circulation: Mild stenosis distal right vertebral artery which supplies the basilar. Left vertebral artery ends in PICA. PICA patent bilaterally. Basilar widely patent. Superior cerebellar arteries patent bilaterally. Fetal origin right posterior cerebral artery. Occlusion distal right posterior cerebral artery. Diffuse atherosclerotic disease left posterior cerebral artery. Venous sinuses: Patent Anatomic variants: None Delayed phase: Not perform Review of the MIP images confirms the above findings CT Brain Perfusion Findings: CBF (<30%) Volume: 28mL Perfusion (Tmax>6.0s) volume: 31mL Mismatch Volume: 3mL Infarction Location:Right MCA territory involving the right temporal lobe and right parietal lobe. CT perfusion volume is smaller than that seen on the recent head CT. This may be due to subacute infarct and pseudo normalization of the CT perfusion. Overall, the CT scan is felt to be more accurate representation of the large territory right MCA infarct. IMPRESSION: Heavily calcified carotid bifurcation bilaterally with mild stenosis bilaterally. Heavily calcified moderate stenosis of the cavernous carotid bilaterally. Moderate stenosis proximal right M1 segment and distal right middle cerebral artery. Right middle cerebral arteries are diseased but patent. Mild left middle cerebral artery stenosis proximally and moderate stenosis distal left middle cerebral artery. Diffuse intracranial atherosclerotic disease with occlusion of the right posterior cerebral artery and diffuse disease in the left posterior cerebral artery. CT perfusion compatible with subacute infarct right MCA territory. CT perfusion under represents the size of the infarct based on CT Head compatible with subacute infarct and pseudo normalization. These results were reviewed in person at the time of interpretation on 07/23/2017 at 2:21 pm to Dr. Delia Heady , who verbally acknowledged these results. Electronically Signed   By:  Marlan Palau M.D.   On: 07/23/2017 14:23   Ct Angio Neck W Or Wo Contrast  Result Date: 07/23/2017 CLINICAL DATA:  Stroke. EXAM: CT ANGIOGRAPHY HEAD AND NECK CT PERFUSION BRAIN TECHNIQUE: Multidetector CT imaging of the head and neck was performed using the standard protocol during bolus administration of intravenous contrast. Multiplanar CT image reconstructions and MIPs were obtained to evaluate the vascular anatomy. Carotid stenosis measurements (when applicable) are obtained utilizing NASCET criteria, using the distal internal carotid diameter as the denominator. Multiphase CT imaging of the brain was performed following IV bolus contrast injection. Subsequent parametric perfusion maps were calculated using RAPID software. CONTRAST:  90mL ISOVUE-370 IOPAMIDOL (ISOVUE-370) INJECTION 76% COMPARISON:  CT head 07/23/2017 FINDINGS: CTA NECK FINDINGS Aortic arch: Atherosclerotic disease in the aortic arch. 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 Ascending aorta measures 37 mm in diameter. Atherosclerotic disease in the proximal great vessels which are patent. Right carotid system: Calcified plaque right carotid bifurcation. Less than 25% diameter stenosis right internal carotid artery at the origin. Left carotid system: Atherosclerotic disease at the proximal left common carotid artery which is mildly narrowed.  Atherosclerotic calcification left carotid bifurcation with less than 25% diameter stenosis. Diffusely diseased left common carotid artery Vertebral arteries: Right vertebral artery dominant and patent to the basilar. Calcified plaque at the origin. Small left vertebral artery ends in PICA. Skeleton: Cervical degenerative change. No acute skeletal abnormality. Other neck: Negative for mass or adenopathy. Upper chest: Negative Review of the MIP images confirms the above findings CTA HEAD FINDINGS Anterior circulation: Cavernous carotid is heavily calcified bilaterally with moderate stenosis right  greater than left Moderate stenosis proximal right M1 segment. Moderate disease right middle cerebral artery bifurcation with moderate stenosis at the origin of the posterior division right MCA. Anterior division of the right middle cerebral artery irregular but patent. Mild stenosis left M1 segment. Moderate stenosis distal left M1. Left middle cerebral artery is patent with scattered atherosclerotic disease. Both anterior cerebral arteries patent Posterior circulation: Mild stenosis distal right vertebral artery which supplies the basilar. Left vertebral artery ends in PICA. PICA patent bilaterally. Basilar widely patent. Superior cerebellar arteries patent bilaterally. Fetal origin right posterior cerebral artery. Occlusion distal right posterior cerebral artery. Diffuse atherosclerotic disease left posterior cerebral artery. Venous sinuses: Patent Anatomic variants: None Delayed phase: Not perform Review of the MIP images confirms the above findings CT Brain Perfusion Findings: CBF (<30%) Volume: 28mL Perfusion (Tmax>6.0s) volume: 31mL Mismatch Volume: 3mL Infarction Location:Right MCA territory involving the right temporal lobe and right parietal lobe. CT perfusion volume is smaller than that seen on the recent head CT. This may be due to subacute infarct and pseudo normalization of the CT perfusion. Overall, the CT scan is felt to be more accurate representation of the large territory right MCA infarct. IMPRESSION: Heavily calcified carotid bifurcation bilaterally with mild stenosis bilaterally. Heavily calcified moderate stenosis of the cavernous carotid bilaterally. Moderate stenosis proximal right M1 segment and distal right middle cerebral artery. Right middle cerebral arteries are diseased but patent. Mild left middle cerebral artery stenosis proximally and moderate stenosis distal left middle cerebral artery. Diffuse intracranial atherosclerotic disease with occlusion of the right posterior cerebral  artery and diffuse disease in the left posterior cerebral artery. CT perfusion compatible with subacute infarct right MCA territory. CT perfusion under represents the size of the infarct based on CT Head compatible with subacute infarct and pseudo normalization. These results were reviewed in person at the time of interpretation on 07/23/2017 at 2:21 pm to Dr. Delia Heady , who verbally acknowledged these results. Electronically Signed   By: Marlan Palau M.D.   On: 07/23/2017 14:23   Mr Brain Wo Contrast  Result Date: 07/24/2017 CLINICAL DATA:  Follow-up code stroke. Subacute RIGHT MCA infarct. Difficulty walking, facial droop. History of Alzheimer's disease, advanced intracranial atherosclerosis. EXAM: MRI HEAD WITHOUT CONTRAST TECHNIQUE: Multiplanar, multiecho pulse sequences of the brain and surrounding structures were obtained without intravenous contrast. COMPARISON:  CT HEAD July 23, 2017 FINDINGS: INTRACRANIAL CONTENTS: Reduced diffusion and susceptibility artifact RIGHT frontotemporal parietal cortex and, RIGHT basal ganglia with patchy low ADC values RIGHT lenticulostriate nucleus, superior aspect RIGHT frontotemporal parietal cortex. Regional mass effect without midline shift. Mild effacement RIGHT lateral ventricle without LEFT ventricle entrapment. Patchy to confluent supratentorial white matter FLAIR T2 hyperintensities exclusive a aforementioned abnormality. VASCULAR: Normal major intracranial vascular flow voids present at skull base. SKULL AND UPPER CERVICAL SPINE: No abnormal sellar expansion. No suspicious calvarial bone marrow signal. Craniocervical junction maintained. SINUSES/ORBITS: Trace paranasal sinus mucosal thickening. Mastoid air cells are well aerated. Included ocular globes and orbital contents are non-suspicious. OTHER: None.  IMPRESSION: 1. Large acute on subacute RIGHT MCA territory infarct with petechial hemorrhage. 2. Regional RIGHT cerebrum mass effect without midline  shift. Mildly effaced RIGHT lateral ventricle without LEFT ventricle entrapment. 3. Moderate chronic small vessel ischemic disease. Electronically Signed   By: Awilda Metro M.D.   On: 07/24/2017 05:19   Ct Abdomen Pelvis W Contrast  Result Date: 06/29/2017 CLINICAL DATA:  Lower abdominal pain. EXAM: CT ABDOMEN AND PELVIS WITH CONTRAST TECHNIQUE: Multidetector CT imaging of the abdomen and pelvis was performed using the standard protocol following bolus administration of intravenous contrast. CONTRAST:  ISOVUE-300 IOPAMIDOL (ISOVUE-300) INJECTION 61% COMPARISON:  01/29/2013 FINDINGS: Lower chest: Linear bibasilar scarring. No effusions. Coronary artery calcifications. Heart is borderline in size. Hepatobiliary: Layering gallstones within the gallbladder. A small hypodensity in the right hepatic lobe is stable since prior study and likely reflects a small cyst. No biliary ductal dilatation. Pancreas: No focal abnormality or ductal dilatation. Spleen: No focal abnormality.  Normal size. Adrenals/Urinary Tract: Several right renal cysts. No hydronephrosis bilaterally. Adrenal glands and urinary bladder unremarkable. Stomach/Bowel: Sigmoid diverticulosis. No active diverticulitis. Appendix is normal. Stomach and small bowel decompressed, unremarkable. Vascular/Lymphatic: Diffuse aortic and iliac calcifications. No aneurysm or adenopathy. Reproductive: Central prostate calcifications. Other: No free fluid or free air. Musculoskeletal: No acute bony abnormality. Chronic appearing compression fractures at T12 and L1. IMPRESSION: Bibasilar scarring. Layering high-density material within the gallbladder, likely small stones. Sigmoid diverticulosis.  No active diverticulitis. Aortoiliac atherosclerosis. No acute findings in the abdomen or pelvis. Electronically Signed   By: Charlett Nose M.D.   On: 06/29/2017 22:26   Ct Cerebral Perfusion W Contrast  Result Date: 07/23/2017 CLINICAL DATA:  Stroke. EXAM: CT  ANGIOGRAPHY HEAD AND NECK CT PERFUSION BRAIN TECHNIQUE: Multidetector CT imaging of the head and neck was performed using the standard protocol during bolus administration of intravenous contrast. Multiplanar CT image reconstructions and MIPs were obtained to evaluate the vascular anatomy. Carotid stenosis measurements (when applicable) are obtained utilizing NASCET criteria, using the distal internal carotid diameter as the denominator. Multiphase CT imaging of the brain was performed following IV bolus contrast injection. Subsequent parametric perfusion maps were calculated using RAPID software. CONTRAST:  90mL ISOVUE-370 IOPAMIDOL (ISOVUE-370) INJECTION 76% COMPARISON:  CT head 07/23/2017 FINDINGS: CTA NECK FINDINGS Aortic arch: Atherosclerotic disease in the aortic arch. 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 Ascending aorta measures 37 mm in diameter. Atherosclerotic disease in the proximal great vessels which are patent. Right carotid system: Calcified plaque right carotid bifurcation. Less than 25% diameter stenosis right internal carotid artery at the origin. Left carotid system: Atherosclerotic disease at the proximal left common carotid artery which is mildly narrowed. Atherosclerotic calcification left carotid bifurcation with less than 25% diameter stenosis. Diffusely diseased left common carotid artery Vertebral arteries: Right vertebral artery dominant and patent to the basilar. Calcified plaque at the origin. Small left vertebral artery ends in PICA. Skeleton: Cervical degenerative change. No acute skeletal abnormality. Other neck: Negative for mass or adenopathy. Upper chest: Negative Review of the MIP images confirms the above findings CTA HEAD FINDINGS Anterior circulation: Cavernous carotid is heavily calcified bilaterally with moderate stenosis right greater than left Moderate stenosis proximal right M1 segment. Moderate disease right middle cerebral artery bifurcation with moderate  stenosis at the origin of the posterior division right MCA. Anterior division of the right middle cerebral artery irregular but patent. Mild stenosis left M1 segment. Moderate stenosis distal left M1. Left middle cerebral artery is patent with scattered atherosclerotic  disease. Both anterior cerebral arteries patent Posterior circulation: Mild stenosis distal right vertebral artery which supplies the basilar. Left vertebral artery ends in PICA. PICA patent bilaterally. Basilar widely patent. Superior cerebellar arteries patent bilaterally. Fetal origin right posterior cerebral artery. Occlusion distal right posterior cerebral artery. Diffuse atherosclerotic disease left posterior cerebral artery. Venous sinuses: Patent Anatomic variants: None Delayed phase: Not perform Review of the MIP images confirms the above findings CT Brain Perfusion Findings: CBF (<30%) Volume: 28mL Perfusion (Tmax>6.0s) volume: 31mL Mismatch Volume: 3mL Infarction Location:Right MCA territory involving the right temporal lobe and right parietal lobe. CT perfusion volume is smaller than that seen on the recent head CT. This may be due to subacute infarct and pseudo normalization of the CT perfusion. Overall, the CT scan is felt to be more accurate representation of the large territory right MCA infarct. IMPRESSION: Heavily calcified carotid bifurcation bilaterally with mild stenosis bilaterally. Heavily calcified moderate stenosis of the cavernous carotid bilaterally. Moderate stenosis proximal right M1 segment and distal right middle cerebral artery. Right middle cerebral arteries are diseased but patent. Mild left middle cerebral artery stenosis proximally and moderate stenosis distal left middle cerebral artery. Diffuse intracranial atherosclerotic disease with occlusion of the right posterior cerebral artery and diffuse disease in the left posterior cerebral artery. CT perfusion compatible with subacute infarct right MCA territory. CT  perfusion under represents the size of the infarct based on CT Head compatible with subacute infarct and pseudo normalization. These results were reviewed in person at the time of interpretation on 07/23/2017 at 2:21 pm to Dr. Delia HeadyPRAMOD SETHI , who verbally acknowledged these results. Electronically Signed   By: Marlan Palauharles  Clark M.D.   On: 07/23/2017 14:23   Dg Chest Port 1 View  Result Date: 07/23/2017 CLINICAL DATA:  Slurred speech. EXAM: PORTABLE CHEST 1 VIEW COMPARISON:  04/04/2013 FINDINGS: 1616 hours. Low lung volumes with lordotic positioning. The cardio pericardial silhouette is enlarged. No overt pulmonary edema. Interstitial markings are diffusely coarsened with chronic features. No focal lung consolidation or substantial pleural effusion. The visualized bony structures of the thorax are intact. Telemetry leads overlie the chest. IMPRESSION: Cardiomegaly without acute cardiopulmonary findings. Electronically Signed   By: Kennith CenterEric  Mansell M.D.   On: 07/23/2017 16:32   Ct Head Code Stroke Wo Contrast  Result Date: 07/23/2017 CLINICAL DATA:  Code stroke. 77 y/o M; fall yesterday, unable to walk with new onset today. EXAM: CT HEAD WITHOUT CONTRAST TECHNIQUE: Contiguous axial images were obtained from the base of the skull through the vertex without intravenous contrast. COMPARISON:  05/13/2013 CT head. FINDINGS: Brain: Large right MCA distribution late acute to subacute infarction involving the anterolateral temporal lobe, caudate head, putamen, and insula. Edema and local mass effect with partial effacement of the right lateral ventricle. Minimal right-sided uncal herniation. No significant midline shift. No hemorrhage. No additional area of infarction identified. Stable background of chronic microvascular ischemic changes and parenchymal volume loss of the brain. Vascular: Increased density of right distal M1 and proximal M2 branches may represent thrombus. Calcific atherosclerosis of carotid siphons. Skull:  Normal. Negative for fracture or focal lesion. Sinuses/Orbits: No acute finding. Other: Right-sided glaucoma device. ASPECTS [facility]([place] Stroke Program Early CT Score) - Ganglionic level infarction (caudate, lentiform nuclei, internal capsule, insula, M1-M3 cortex): 1 - Supraganglionic infarction (M4-M6 cortex): 3 Total score (0-10 with 10 being normal): 4 IMPRESSION: 1. Right inferior MCA distribution late acute to subacute infarction involving temporal lobe, insula, and basal ganglia. Mass effect with partial effacement of right  lateral ventricle and minimal right uncal herniation. No significant midline shift. No appreciable hemorrhage. 2. Density of right distal M1 and proximal M2 branches may represent thrombus. 3. ASPECTS is 4 These results were communicated to Dr. Pearlean Brownie at 1:46 pmon 2/21/2019by text page via the [facility] messaging system. Electronically Signed   By: Mitzi Hansen M.D.   On: 07/23/2017 13:47    Results/Tests Pending at Time of Discharge:  Unresulted Labs (From admission, onward)   Start     Ordered   07/25/17 0500  Basic metabolic panel  Daily,   R     07/24/17 1107       Discharge Medications:  Allergies as of 07/28/2017   No Known Allergies     Medication List    TAKE these medications   aspirin EC 81 MG tablet Take 81 mg by mouth daily.   atorvastatin 80 MG tablet Commonly known as:  LIPITOR Take 1 tablet (80 mg total) by mouth daily at 6 PM.   clopidogrel 75 MG tablet Commonly known as:  PLAVIX Take 1 tablet (75 mg total) by mouth daily. Start taking on:  07/29/2017   latanoprost 0.005 % ophthalmic solution Commonly known as:  XALATAN Place 1 drop into the left eye at bedtime.   memantine 5 MG tablet Commonly known as:  NAMENDA Take 5 mg by mouth daily.   metoprolol succinate 25 MG 24 hr tablet Commonly known as:  TOPROL-XL Take 0.5 tablets (12.5 mg total) by mouth daily. Start taking on:  07/29/2017       Discharge Instructions: Please  refer to Patient Instructions section of EMR for full details.  Patient was counseled important signs and symptoms that should prompt return to medical care, changes in medications, dietary instructions, activity restrictions, and follow up appointments.   Follow-Up Appointments: Follow-up Information    Micki Riley, MD. Schedule an appointment as soon as possible for a visit in 6 week(s).   Specialties:  Neurology, Radiology Contact information: [address] [place] [address] [phone]           Garnette Gunner, MD 07/28/2017, 3:27 PM PGY-1, [facility]

## 2017-07-24 NOTE — Evaluation (Signed)
Clinical/Bedside Swallow Evaluation Patient Details  Name: Noah Bush. MRN: [id number] Date of Birth: 17-Nov-1940  Today's Date: 07/24/2017 Time: SLP Start Time (ACUTE ONLY): 1000 SLP Stop Time (ACUTE ONLY): 1017 SLP Time Calculation (min) (ACUTE ONLY): 17 min  Past Medical History:  Past Medical History:  Diagnosis Date  . Alzheimer's dementia    Noah Bush 07/23/2017  . CKD (chronic kidney disease), stage III (HCC)    Noah Bush 07/23/2017  . Gout   . Hypertension   . Type II diabetes mellitus (HCC)    Noah Bush 07/23/2017   Past Surgical History:  Past Surgical History:  Procedure Laterality Date  . COLONOSCOPY WITH PROPOFOL N/A 05/09/2015   Procedure: COLONOSCOPY WITH PROPOFOL;  Surgeon: Noah Mayotte, MD;  Location: [facility] ENDOSCOPY;  Service: Endoscopy;  Laterality: N/A;  . TIBIA FRACTURE SURGERY Left    HPI:  77 y.o. male presenting as a code stroke with difficulty walking, drooling, and facial droop. PMH is significant for HTN, Alzheimer dementia, CKD III, and T2DM. 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 CXR cardiomegaly without acute cardiopulmonary findings.   Assessment / Plan / Recommendation Clinical Impression  Pt sleepy from sedating meds this morning for MRI and awakened with movement via PT (sitting edge of bed) and vebal cues. Exhibits severe left neglect, oriented to place. Reduced clearance with mild left buccal residual with pudding and anterior/left spill using cup. No overt s/s aspiration present however recommend continue NPO due to decreased ability to maintain alertness longer than 3-4 minutes. Okay for ice chips after oral care once adequately alert and ST will continue to follow for po readiness.     SLP Visit Diagnosis: Dysphagia, unspecified (R13.10)    Aspiration Risk  Moderate aspiration risk    Diet Recommendation Ice chips PRN after oral care;NPO   Medication Administration: (no oral meds per RN)     Other  Recommendations Oral Care Recommendations: Oral care QID   Follow up Recommendations Other (comment)(TBD)      Frequency and Duration min 2x/week  2 weeks       Prognosis Prognosis for Safe Diet Advancement: Good      Swallow Study   General HPI: 77 y.o. male presenting as a code stroke with difficulty walking, drooling, and facial droop. PMH is significant for HTN, Alzheimer dementia, CKD III, and T2DM. 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 CXR cardiomegaly without acute cardiopulmonary findings. Type of Study: Bedside Swallow Evaluation Previous Swallow Assessment: none found Diet Prior to this Study: NPO Temperature Spikes Noted: No Respiratory Status: Nasal cannula History of Recent Intubation: No Behavior/Cognition: Lethargic/Drowsy;Cooperative;Pleasant mood;Requires cueing Oral Cavity Assessment: (foul odor) Oral Care Completed by SLP: Yes Oral Cavity - Dentition: Poor condition;Missing dentition Vision: Impaired for self-feeding(severe left neglect) Self-Feeding Abilities: Needs assist;Needs set up Patient Positioning: Upright in bed Baseline Vocal Quality: Normal Volitional Cough: Weak Volitional Swallow: Unable to elicit    Oral/Motor/Sensory Function Overall Oral Motor/Sensory Function: Moderate impairment Facial ROM: Reduced left;Suspected CN VII (facial) dysfunction Facial Symmetry: Abnormal symmetry left;Suspected CN VII (facial) dysfunction Facial Strength: Reduced left;Suspected CN VII (facial) dysfunction   Ice Chips Ice chips: Not tested   Thin Liquid Thin Liquid: Impaired Presentation: Cup;Straw Oral Phase Impairments: Reduced labial seal Oral Phase Functional Implications: Left anterior spillage    Nectar Thick Nectar Thick Liquid: Not tested   Honey Thick Honey Thick Liquid: Not tested   Puree Puree: Impaired Presentation: Spoon Oral  Phase Impairments: Reduced lingual  movement/coordination;Poor awareness of bolus Oral Phase Functional Implications: Left lateral sulci pocketing   Solid   GO   Solid: Not tested        Noah Bush, Noah Bush 07/24/2017,11:41 AM  Noah Bush [name]

## 2017-07-24 NOTE — Progress Notes (Addendum)
Interim Progress Note:  Checked on patient this evening, as he was difficult to awaken this morning. He was sitting up in bed, resting comfortably. Alert and oriented x 3. Answering questions and conversing appropriately. States he is doing fine. No concerns. On exam, he has 4/5 LUE weakness and 3/5 LLE weakness. Will continue to monitor mental status.  Willadean CarolKaty Mayo, MD PGY-3

## 2017-07-24 NOTE — Progress Notes (Signed)
  Echocardiogram 2D Echocardiogram has been performed.  Noah Bush Noah Bush 07/24/2017, 8:48 AM

## 2017-07-24 NOTE — Progress Notes (Signed)
FPTS Interim Progress Note  [phone][facility] discuss patient's medication regimen. Per pharmacy patient's active medications are as follows:  lantanoprost eye drops - 1 drop in Left eye qhs Lisinopril-HCTZ 20/12.5 daily memantine daily Glipizide ER 2.5 daily Doxazosin 4mg  daily  proair 2 puffs q4hrs prn  flomax 0.4mg  daily (however per pharmacy patient has not filled prescription since December so unclear whether patient is still taking/prescribed this medication)  Noah ManisAbraham, [name], DO 07/24/2017, 12:06 PM PGY-1, [facility] pager [phone]

## 2017-07-24 NOTE — Consult Note (Signed)
Physical Medicine and Rehabilitation Consult Reason for Consult:Decreased functional mobility Referring Physician: [facility]   HPI: Noah Bush. is a 77 y.o.right handed male with history of Alzheimer's disease, hypertension, diabetes mellitus,CKD stage III. Patient lives with spouse as well as adult aged children. Ambulated independently but needed assistance for ADLs. Presented 07/23/2017 with decreased functional mobility, left-sided weakness and dysarthric speech. Noted hyperkalemia 5.6, creatinine 1.77.CT of the head showed large subacute right posterior MCA infarction with mass effect and partial right ventricle effacement. CT angio with diffuse moderate severe intracranial atherosclerotic disease. Patient did not receive TPA.MRI large acute right MCA territory infarct with petechial hemorrhage. Regional right cerebral mass effect without midline shift.echocardiogram with ejection fraction of 45% grade 1 diastolic dysfunction.eurology consulted with workup presently ongoing.dysphagia #2 honey thick liquid diet.Physical therapy evaluation completed 07/24/2017 with recommendations of physical medicine rehabilitation consult.   Review of Systems  Unable to perform ROS: Acuity of condition   Past Medical History:  Diagnosis Date  . Alzheimer's dementia    [name] 07/23/2017  . CKD (chronic kidney disease), stage III (HCC)    [name] 07/23/2017  . Gout   . Hypertension   . Type II diabetes mellitus (HCC)    [name] 07/23/2017   Past Surgical History:  Procedure Laterality Date  . COLONOSCOPY WITH PROPOFOL N/A 05/09/2015   Procedure: COLONOSCOPY WITH PROPOFOL;  Surgeon: [name], MD;  Location: [facility] ENDOSCOPY;  Service: Endoscopy;  Laterality: N/A;  . TIBIA FRACTURE SURGERY Left    Family History  Problem Relation Age of Onset  . Diabetes Paternal Grandfather    Social History:  reports that  has never smoked. he has never used smokeless tobacco. He reports that he does  not drink alcohol or use drugs. Allergies: No Known Allergies Medications Prior to Admission  Medication Sig Dispense Refill  . polyethylene glycol powder (GLYCOLAX/MIRALAX) powder 255 grams one bottle for colonoscopy prep (Patient not taking: Reported on 06/29/2017) 255 g 0    Home: Home Living Family/patient expects to be discharged to:: Private residence Living Arrangements: Spouse/significant other, Other relatives Available Help at Discharge: Family Type of Home: House  Functional History: Prior Function Level of Independence: Needs assistance Gait / Transfers Assistance Needed: ambulatory Comments: Pt is a poor historian. No family at bedside. Per chart, pt lived at home with wife and adult children. Ambulated independently. Pt has h/o dementia. Unsure if assist was needed for ADLs.  Functional Status:  Mobility: Bed Mobility Overal bed mobility: Needs Assistance Bed Mobility: Supine to Sit, Sit to Supine Supine to sit: Max assist, HOB elevated Sit to supine: +2 for physical assistance, Max assist General bed mobility comments: multi modal cues for sequencing, use of bed pad to scoot to EOB Transfers General transfer comment: unable Ambulation/Gait General Gait Details: unable    ADL:    Cognition: Cognition Overall Cognitive Status: No family/caregiver present to determine baseline cognitive functioning Orientation Level: Oriented to person Cognition Arousal/Alertness: Awake/alert Behavior During Therapy: WFL for tasks assessed/performed Overall Cognitive Status: No family/caregiver present to determine baseline cognitive functioning General Comments: Oriented to self and place. Answers questions appropriately. Follows simple commands inconsistently. Able to have conversation about the [facility] basketball game.   Blood pressure 129/72, pulse 67, temperature 98.4 F (36.9 C), temperature source Oral, resp. rate 18, height 5\' 11"  (1.803 m), weight 124.3 kg (274 lb),  SpO2 98 %. Physical Exam  Vitals reviewed. Constitutional:  Obese male  HENT:  Left facial droop  Eyes:  Pupils sluggish to light  Neck: Normal range of motion. Neck supple. No thyromegaly present.  Cardiovascular: Normal rate, regular rhythm and normal heart sounds.  Respiratory: Effort normal and breath sounds normal.  GI: Soft. Bowel sounds are normal. He exhibits no distension.  Neurological:  Sedated had just received ativan today prior to MRI. Patient is arousable would easily fall back asleep. Very limited to follow commands.Exam limited due to sedation. Initiated some movement of right side with pain provocation. Seemed to sense pain stim on left but did not move left side  Skin: Skin is warm and dry.    Results for orders placed or performed during the hospital encounter of 07/23/17 (from the past 24 hour(s))  Protime-INR     Status: None   Collection Time: 07/23/17  1:25 PM  Result Value Ref Range   Prothrombin Time 13.4 11.4 - 15.2 seconds   INR 1.03   APTT     Status: None   Collection Time: 07/23/17  1:25 PM  Result Value Ref Range   aPTT 34 24 - 36 seconds  CBC     Status: None   Collection Time: 07/23/17  1:25 PM  Result Value Ref Range   WBC 7.6 4.0 - 10.5 K/uL   RBC 5.00 4.22 - 5.81 MIL/uL   Hemoglobin 14.2 13.0 - 17.0 g/dL   HCT 16.141.2 09.639.0 - 04.552.0 %   MCV 82.4 78.0 - 100.0 fL   MCH 28.4 26.0 - 34.0 pg   MCHC 34.5 30.0 - 36.0 g/dL   RDW 40.914.3 81.111.5 - 91.415.5 %   Platelets 249 150 - 400 K/uL  Differential     Status: None   Collection Time: 07/23/17  1:25 PM  Result Value Ref Range   Neutrophils Relative % 62 %   Neutro Abs 4.7 1.7 - 7.7 K/uL   Lymphocytes Relative 32 %   Lymphs Abs 2.4 0.7 - 4.0 K/uL   Monocytes Relative 5 %   Monocytes Absolute 0.4 0.1 - 1.0 K/uL   Eosinophils Relative 1 %   Eosinophils Absolute 0.1 0.0 - 0.7 K/uL   Basophils Relative 0 %   Basophils Absolute 0.0 0.0 - 0.1 K/uL  Comprehensive metabolic panel     Status: Abnormal    Collection Time: 07/23/17  1:25 PM  Result Value Ref Range   Sodium 135 135 - 145 mmol/L   Potassium 5.6 (H) 3.5 - 5.1 mmol/L   Chloride 108 101 - 111 mmol/L   CO2 17 (L) 22 - 32 mmol/L   Glucose, Bld 107 (H) 65 - 99 mg/dL   BUN 39 (H) 6 - 20 mg/dL   Creatinine, Ser 7.821.77 (H) 0.61 - 1.24 mg/dL   Calcium 9.6 8.9 - 95.610.3 mg/dL   Total Protein 7.7 6.5 - 8.1 g/dL   Albumin 3.9 3.5 - 5.0 g/dL   AST 20 15 - 41 U/L   ALT 15 (L) 17 - 63 U/L   Alkaline Phosphatase 50 38 - 126 U/L   Total Bilirubin 0.7 0.3 - 1.2 mg/dL   GFR calc non Af Amer 36 (L) >60 mL/min   GFR calc Af Amer 41 (L) >60 mL/min   Anion gap 10 5 - 15  I-stat troponin, ED     Status: Abnormal   Collection Time: 07/23/17  1:33 PM  Result Value Ref Range   Troponin i, poc 0.27 (HH) 0.00 - 0.08 ng/mL   Comment NOTIFIED PHYSICIAN    Comment 3  I-Stat Chem 8, ED     Status: Abnormal   Collection Time: 07/23/17  1:34 PM  Result Value Ref Range   Sodium 138 135 - 145 mmol/L   Potassium 5.6 (H) 3.5 - 5.1 mmol/L   Chloride 108 101 - 111 mmol/L   BUN 39 (H) 6 - 20 mg/dL   Creatinine, Ser 1.61 (H) 0.61 - 1.24 mg/dL   Glucose, Bld 096 (H) 65 - 99 mg/dL   Calcium, Ion 0.45 4.09 - 1.40 mmol/L   TCO2 20 (L) 22 - 32 mmol/L   Hemoglobin 15.3 13.0 - 17.0 g/dL   HCT 81.1 91.4 - 78.2 %  Glucose, capillary     Status: None   Collection Time: 07/23/17  4:10 PM  Result Value Ref Range   Glucose-Capillary 86 65 - 99 mg/dL   Comment 1 Notify RN    Comment 2 Document in Chart   Blood Alcohol Level     Status: None   Collection Time: 07/23/17  4:38 PM  Result Value Ref Range   Alcohol, Ethyl (B) <10 <10 mg/dL  Glucose, capillary     Status: None   Collection Time: 07/23/17  6:20 PM  Result Value Ref Range   Glucose-Capillary 71 65 - 99 mg/dL  Troponin I     Status: Abnormal   Collection Time: 07/23/17  9:46 PM  Result Value Ref Range   Troponin I 0.16 (HH) <0.03 ng/mL  Glucose, capillary     Status: None   Collection Time:  07/23/17 10:19 PM  Result Value Ref Range   Glucose-Capillary 99 65 - 99 mg/dL   Comment 1 Notify RN    Comment 2 Document in Chart   Glucose, capillary     Status: None   Collection Time: 07/24/17  5:37 AM  Result Value Ref Range   Glucose-Capillary 93 65 - 99 mg/dL   Comment 1 Notify RN    Comment 2 Document in Chart   Troponin I     Status: Abnormal   Collection Time: 07/24/17  5:46 AM  Result Value Ref Range   Troponin I 0.16 (HH) <0.03 ng/mL  CBC     Status: None   Collection Time: 07/24/17  5:46 AM  Result Value Ref Range   WBC 8.2 4.0 - 10.5 K/uL   RBC 4.91 4.22 - 5.81 MIL/uL   Hemoglobin 13.9 13.0 - 17.0 g/dL   HCT 95.6 21.3 - 08.6 %   MCV 83.1 78.0 - 100.0 fL   MCH 28.3 26.0 - 34.0 pg   MCHC 34.1 30.0 - 36.0 g/dL   RDW 57.8 46.9 - 62.9 %   Platelets 228 150 - 400 K/uL  Basic metabolic panel     Status: Abnormal   Collection Time: 07/24/17  5:46 AM  Result Value Ref Range   Sodium 138 135 - 145 mmol/L   Potassium 5.6 (H) 3.5 - 5.1 mmol/L   Chloride 110 101 - 111 mmol/L   CO2 17 (L) 22 - 32 mmol/L   Glucose, Bld 102 (H) 65 - 99 mg/dL   BUN 31 (H) 6 - 20 mg/dL   Creatinine, Ser 5.28 (H) 0.61 - 1.24 mg/dL   Calcium 9.4 8.9 - 41.3 mg/dL   GFR calc non Af Amer 39 (L) >60 mL/min   GFR calc Af Amer 45 (L) >60 mL/min   Anion gap 11 5 - 15  Hemoglobin A1c     Status: Abnormal   Collection Time: 07/24/17  5:46 AM  Result Value Ref Range   Hgb A1c MFr Bld 6.9 (H) 4.8 - 5.6 %   Mean Plasma Glucose 151.33 mg/dL  Lipid panel     Status: Abnormal   Collection Time: 07/24/17  5:46 AM  Result Value Ref Range   Cholesterol 252 (H) 0 - 200 mg/dL   Triglycerides 440 <347 mg/dL   HDL 46 >42 mg/dL   Total CHOL/HDL Ratio 5.5 RATIO   VLDL 21 0 - 40 mg/dL   LDL Cholesterol 595 (H) 0 - 99 mg/dL  TSH     Status: None   Collection Time: 07/24/17  7:22 AM  Result Value Ref Range   TSH 2.026 0.350 - 4.500 uIU/mL  Troponin I     Status: Abnormal   Collection Time: 07/24/17 10:15  AM  Result Value Ref Range   Troponin I 0.14 (HH) <0.03 ng/mL   Ct Angio Head W Or Wo Contrast  Result Date: 07/23/2017 CLINICAL DATA:  Stroke. EXAM: CT ANGIOGRAPHY HEAD AND NECK CT PERFUSION BRAIN TECHNIQUE: Multidetector CT imaging of the head and neck was performed using the standard protocol during bolus administration of intravenous contrast. Multiplanar CT image reconstructions and MIPs were obtained to evaluate the vascular anatomy. Carotid stenosis measurements (when applicable) are obtained utilizing NASCET criteria, using the distal internal carotid diameter as the denominator. Multiphase CT imaging of the brain was performed following IV bolus contrast injection. Subsequent parametric perfusion maps were calculated using RAPID software. CONTRAST:  90mL ISOVUE-370 IOPAMIDOL (ISOVUE-370) INJECTION 76% COMPARISON:  CT head 07/23/2017 FINDINGS: CTA NECK FINDINGS Aortic arch: Atherosclerotic disease in the aortic arch. 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 Ascending aorta measures 37 mm in diameter. Atherosclerotic disease in the proximal great vessels which are patent. Right carotid system: Calcified plaque right carotid bifurcation. Less than 25% diameter stenosis right internal carotid artery at the origin. Left carotid system: Atherosclerotic disease at the proximal left common carotid artery which is mildly narrowed. Atherosclerotic calcification left carotid bifurcation with less than 25% diameter stenosis. Diffusely diseased left common carotid artery Vertebral arteries: Right vertebral artery dominant and patent to the basilar. Calcified plaque at the origin. Small left vertebral artery ends in PICA. Skeleton: Cervical degenerative change. No acute skeletal abnormality. Other neck: Negative for mass or adenopathy. Upper chest: Negative Review of the MIP images confirms the above findings CTA HEAD FINDINGS Anterior circulation: Cavernous carotid is heavily calcified bilaterally with  moderate stenosis right greater than left Moderate stenosis proximal right M1 segment. Moderate disease right middle cerebral artery bifurcation with moderate stenosis at the origin of the posterior division right MCA. Anterior division of the right middle cerebral artery irregular but patent. Mild stenosis left M1 segment. Moderate stenosis distal left M1. Left middle cerebral artery is patent with scattered atherosclerotic disease. Both anterior cerebral arteries patent Posterior circulation: Mild stenosis distal right vertebral artery which supplies the basilar. Left vertebral artery ends in PICA. PICA patent bilaterally. Basilar widely patent. Superior cerebellar arteries patent bilaterally. Fetal origin right posterior cerebral artery. Occlusion distal right posterior cerebral artery. Diffuse atherosclerotic disease left posterior cerebral artery. Venous sinuses: Patent Anatomic variants: None Delayed phase: Not perform Review of the MIP images confirms the above findings CT Brain Perfusion Findings: CBF (<30%) Volume: 28mL Perfusion (Tmax>6.0s) volume: 31mL Mismatch Volume: 3mL Infarction Location:Right MCA territory involving the right temporal lobe and right parietal lobe. CT perfusion volume is smaller than that seen on the recent head CT. This may be due to subacute infarct and pseudo  normalization of the CT perfusion. Overall, the CT scan is felt to be more accurate representation of the large territory right MCA infarct. IMPRESSION: Heavily calcified carotid bifurcation bilaterally with mild stenosis bilaterally. Heavily calcified moderate stenosis of the cavernous carotid bilaterally. Moderate stenosis proximal right M1 segment and distal right middle cerebral artery. Right middle cerebral arteries are diseased but patent. Mild left middle cerebral artery stenosis proximally and moderate stenosis distal left middle cerebral artery. Diffuse intracranial atherosclerotic disease with occlusion of the right  posterior cerebral artery and diffuse disease in the left posterior cerebral artery. CT perfusion compatible with subacute infarct right MCA territory. CT perfusion under represents the size of the infarct based on CT Head compatible with subacute infarct and pseudo normalization. These results were reviewed in person at the time of interpretation on 07/23/2017 at 2:21 pm to Dr. Delia Heady , who verbally acknowledged these results. Electronically Signed   By: Marlan Palau M.D.   On: 07/23/2017 14:23   Ct Angio Neck W Or Wo Contrast  Result Date: 07/23/2017 CLINICAL DATA:  Stroke. EXAM: CT ANGIOGRAPHY HEAD AND NECK CT PERFUSION BRAIN TECHNIQUE: Multidetector CT imaging of the head and neck was performed using the standard protocol during bolus administration of intravenous contrast. Multiplanar CT image reconstructions and MIPs were obtained to evaluate the vascular anatomy. Carotid stenosis measurements (when applicable) are obtained utilizing NASCET criteria, using the distal internal carotid diameter as the denominator. Multiphase CT imaging of the brain was performed following IV bolus contrast injection. Subsequent parametric perfusion maps were calculated using RAPID software. CONTRAST:  90mL ISOVUE-370 IOPAMIDOL (ISOVUE-370) INJECTION 76% COMPARISON:  CT head 07/23/2017 FINDINGS: CTA NECK FINDINGS Aortic arch: Atherosclerotic disease in the aortic arch. 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 Ascending aorta measures 37 mm in diameter. Atherosclerotic disease in the proximal great vessels which are patent. Right carotid system: Calcified plaque right carotid bifurcation. Less than 25% diameter stenosis right internal carotid artery at the origin. Left carotid system: Atherosclerotic disease at the proximal left common carotid artery which is mildly narrowed. Atherosclerotic calcification left carotid bifurcation with less than 25% diameter stenosis. Diffusely diseased left common carotid artery  Vertebral arteries: Right vertebral artery dominant and patent to the basilar. Calcified plaque at the origin. Small left vertebral artery ends in PICA. Skeleton: Cervical degenerative change. No acute skeletal abnormality. Other neck: Negative for mass or adenopathy. Upper chest: Negative Review of the MIP images confirms the above findings CTA HEAD FINDINGS Anterior circulation: Cavernous carotid is heavily calcified bilaterally with moderate stenosis right greater than left Moderate stenosis proximal right M1 segment. Moderate disease right middle cerebral artery bifurcation with moderate stenosis at the origin of the posterior division right MCA. Anterior division of the right middle cerebral artery irregular but patent. Mild stenosis left M1 segment. Moderate stenosis distal left M1. Left middle cerebral artery is patent with scattered atherosclerotic disease. Both anterior cerebral arteries patent Posterior circulation: Mild stenosis distal right vertebral artery which supplies the basilar. Left vertebral artery ends in PICA. PICA patent bilaterally. Basilar widely patent. Superior cerebellar arteries patent bilaterally. Fetal origin right posterior cerebral artery. Occlusion distal right posterior cerebral artery. Diffuse atherosclerotic disease left posterior cerebral artery. Venous sinuses: Patent Anatomic variants: None Delayed phase: Not perform Review of the MIP images confirms the above findings CT Brain Perfusion Findings: CBF (<30%) Volume: 28mL Perfusion (Tmax>6.0s) volume: 31mL Mismatch Volume: 3mL Infarction Location:Right MCA territory involving the right temporal lobe and right parietal lobe. CT perfusion volume is  smaller than that seen on the recent head CT. This may be due to subacute infarct and pseudo normalization of the CT perfusion. Overall, the CT scan is felt to be more accurate representation of the large territory right MCA infarct. IMPRESSION: Heavily calcified carotid bifurcation  bilaterally with mild stenosis bilaterally. Heavily calcified moderate stenosis of the cavernous carotid bilaterally. Moderate stenosis proximal right M1 segment and distal right middle cerebral artery. Right middle cerebral arteries are diseased but patent. Mild left middle cerebral artery stenosis proximally and moderate stenosis distal left middle cerebral artery. Diffuse intracranial atherosclerotic disease with occlusion of the right posterior cerebral artery and diffuse disease in the left posterior cerebral artery. CT perfusion compatible with subacute infarct right MCA territory. CT perfusion under represents the size of the infarct based on CT Head compatible with subacute infarct and pseudo normalization. These results were reviewed in person at the time of interpretation on 07/23/2017 at 2:21 pm to Dr. Delia Heady , who verbally acknowledged these results. Electronically Signed   By: Marlan Palau M.D.   On: 07/23/2017 14:23   Mr Brain Wo Contrast  Result Date: 07/24/2017 CLINICAL DATA:  Follow-up code stroke. Subacute RIGHT MCA infarct. Difficulty walking, facial droop. History of Alzheimer's disease, advanced intracranial atherosclerosis. EXAM: MRI HEAD WITHOUT CONTRAST TECHNIQUE: Multiplanar, multiecho pulse sequences of the brain and surrounding structures were obtained without intravenous contrast. COMPARISON:  CT HEAD July 23, 2017 FINDINGS: INTRACRANIAL CONTENTS: Reduced diffusion and susceptibility artifact RIGHT frontotemporal parietal cortex and, RIGHT basal ganglia with patchy low ADC values RIGHT lenticulostriate nucleus, superior aspect RIGHT frontotemporal parietal cortex. Regional mass effect without midline shift. Mild effacement RIGHT lateral ventricle without LEFT ventricle entrapment. Patchy to confluent supratentorial white matter FLAIR T2 hyperintensities exclusive a aforementioned abnormality. VASCULAR: Normal major intracranial vascular flow voids present at skull base.  SKULL AND UPPER CERVICAL SPINE: No abnormal sellar expansion. No suspicious calvarial bone marrow signal. Craniocervical junction maintained. SINUSES/ORBITS: Trace paranasal sinus mucosal thickening. Mastoid air cells are well aerated. Included ocular globes and orbital contents are non-suspicious. OTHER: None. IMPRESSION: 1. Large acute on subacute RIGHT MCA territory infarct with petechial hemorrhage. 2. Regional RIGHT cerebrum mass effect without midline shift. Mildly effaced RIGHT lateral ventricle without LEFT ventricle entrapment. 3. Moderate chronic small vessel ischemic disease. Electronically Signed   By: Awilda Metro M.D.   On: 07/24/2017 05:19   Ct Cerebral Perfusion W Contrast  Result Date: 07/23/2017 CLINICAL DATA:  Stroke. EXAM: CT ANGIOGRAPHY HEAD AND NECK CT PERFUSION BRAIN TECHNIQUE: Multidetector CT imaging of the head and neck was performed using the standard protocol during bolus administration of intravenous contrast. Multiplanar CT image reconstructions and MIPs were obtained to evaluate the vascular anatomy. Carotid stenosis measurements (when applicable) are obtained utilizing NASCET criteria, using the distal internal carotid diameter as the denominator. Multiphase CT imaging of the brain was performed following IV bolus contrast injection. Subsequent parametric perfusion maps were calculated using RAPID software. CONTRAST:  90mL ISOVUE-370 IOPAMIDOL (ISOVUE-370) INJECTION 76% COMPARISON:  CT head 07/23/2017 FINDINGS: CTA NECK FINDINGS Aortic arch: Atherosclerotic disease in the aortic arch. 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 Ascending aorta measures 37 mm in diameter. Atherosclerotic disease in the proximal great vessels which are patent. Right carotid system: Calcified plaque right carotid bifurcation. Less than 25% diameter stenosis right internal carotid artery at the origin. Left carotid system: Atherosclerotic disease at the proximal left common carotid artery  which is mildly narrowed. Atherosclerotic calcification left carotid bifurcation with less than  25% diameter stenosis. Diffusely diseased left common carotid artery Vertebral arteries: Right vertebral artery dominant and patent to the basilar. Calcified plaque at the origin. Small left vertebral artery ends in PICA. Skeleton: Cervical degenerative change. No acute skeletal abnormality. Other neck: Negative for mass or adenopathy. Upper chest: Negative Review of the MIP images confirms the above findings CTA HEAD FINDINGS Anterior circulation: Cavernous carotid is heavily calcified bilaterally with moderate stenosis right greater than left Moderate stenosis proximal right M1 segment. Moderate disease right middle cerebral artery bifurcation with moderate stenosis at the origin of the posterior division right MCA. Anterior division of the right middle cerebral artery irregular but patent. Mild stenosis left M1 segment. Moderate stenosis distal left M1. Left middle cerebral artery is patent with scattered atherosclerotic disease. Both anterior cerebral arteries patent Posterior circulation: Mild stenosis distal right vertebral artery which supplies the basilar. Left vertebral artery ends in PICA. PICA patent bilaterally. Basilar widely patent. Superior cerebellar arteries patent bilaterally. Fetal origin right posterior cerebral artery. Occlusion distal right posterior cerebral artery. Diffuse atherosclerotic disease left posterior cerebral artery. Venous sinuses: Patent Anatomic variants: None Delayed phase: Not perform Review of the MIP images confirms the above findings CT Brain Perfusion Findings: CBF (<30%) Volume: 28mL Perfusion (Tmax>6.0s) volume: 31mL Mismatch Volume: 3mL Infarction Location:Right MCA territory involving the right temporal lobe and right parietal lobe. CT perfusion volume is smaller than that seen on the recent head CT. This may be due to subacute infarct and pseudo normalization of the CT  perfusion. Overall, the CT scan is felt to be more accurate representation of the large territory right MCA infarct. IMPRESSION: Heavily calcified carotid bifurcation bilaterally with mild stenosis bilaterally. Heavily calcified moderate stenosis of the cavernous carotid bilaterally. Moderate stenosis proximal right M1 segment and distal right middle cerebral artery. Right middle cerebral arteries are diseased but patent. Mild left middle cerebral artery stenosis proximally and moderate stenosis distal left middle cerebral artery. Diffuse intracranial atherosclerotic disease with occlusion of the right posterior cerebral artery and diffuse disease in the left posterior cerebral artery. CT perfusion compatible with subacute infarct right MCA territory. CT perfusion under represents the size of the infarct based on CT Head compatible with subacute infarct and pseudo normalization. These results were reviewed in person at the time of interpretation on 07/23/2017 at 2:21 pm to Dr. Delia Heady , who verbally acknowledged these results. Electronically Signed   By: Marlan Palau M.D.   On: 07/23/2017 14:23   Dg Chest Port 1 View  Result Date: 07/23/2017 CLINICAL DATA:  Slurred speech. EXAM: PORTABLE CHEST 1 VIEW COMPARISON:  04/04/2013 FINDINGS: 1616 hours. Low lung volumes with lordotic positioning. The cardio pericardial silhouette is enlarged. No overt pulmonary edema. Interstitial markings are diffusely coarsened with chronic features. No focal lung consolidation or substantial pleural effusion. The visualized bony structures of the thorax are intact. Telemetry leads overlie the chest. IMPRESSION: Cardiomegaly without acute cardiopulmonary findings. Electronically Signed   By: Kennith Center M.D.   On: 07/23/2017 16:32   Ct Head Code Stroke Wo Contrast  Result Date: 07/23/2017 CLINICAL DATA:  Code stroke. 77 y/o M; fall yesterday, unable to walk with new onset today. EXAM: CT HEAD WITHOUT CONTRAST TECHNIQUE:  Contiguous axial images were obtained from the base of the skull through the vertex without intravenous contrast. COMPARISON:  05/13/2013 CT head. FINDINGS: Brain: Large right MCA distribution late acute to subacute infarction involving the anterolateral temporal lobe, caudate head, putamen, and insula. Edema and local mass effect  with partial effacement of the right lateral ventricle. Minimal right-sided uncal herniation. No significant midline shift. No hemorrhage. No additional area of infarction identified. Stable background of chronic microvascular ischemic changes and parenchymal volume loss of the brain. Vascular: Increased density of right distal M1 and proximal M2 branches may represent thrombus. Calcific atherosclerosis of carotid siphons. Skull: Normal. Negative for fracture or focal lesion. Sinuses/Orbits: No acute finding. Other: Right-sided glaucoma device. ASPECTS [facility] Stroke Program Early CT Score) - Ganglionic level infarction (caudate, lentiform nuclei, internal capsule, insula, M1-M3 cortex): 1 - Supraganglionic infarction (M4-M6 cortex): 3 Total score (0-10 with 10 being normal): 4 IMPRESSION: 1. Right inferior MCA distribution late acute to subacute infarction involving temporal lobe, insula, and basal ganglia. Mass effect with partial effacement of right lateral ventricle and minimal right uncal herniation. No significant midline shift. No appreciable hemorrhage. 2. Density of right distal M1 and proximal M2 branches may represent thrombus. 3. ASPECTS is 4 These results were communicated to Dr. Pearlean Brownie at 1:46 pmon 2/21/2019by text page via the [facility] messaging system. Electronically Signed   By: Mitzi Hansen M.D.   On: 07/23/2017 13:47    Assessment/Plan: Diagnosis: Large right MCA infarct 07/23/17 1. Does the need for close, 24 hr/day medical supervision in concert with the patient's rehab needs make it unreasonable for this patient to be served in a less intensive setting?  Yes and Potentially 2. Co-Morbidities requiring supervision/potential complications: CKD, DM2, Alzheimer's Dementia 3. Due to bladder management, bowel management, safety, skin/wound care, disease management, medication administration and patient education, does the patient require 24 hr/day rehab nursing? Yes and Potentially 4. Does the patient require coordinated care of a physician, rehab nurse, PT (1-2 hrs/day, 5 days/week), OT (1-2 hrs/day, 5 days/week) and SLP (1-2 hrs/day, 5 days/week) to address physical and functional deficits in the context of the above medical diagnosis(es)? Potentially Addressing deficits in the following areas: balance, endurance, locomotion, strength, transferring, bowel/bladder control, bathing, dressing, feeding, grooming, toileting, cognition, speech, language, swallowing and psychosocial support 5. Can the patient actively participate in an intensive therapy program of at least 3 hrs of therapy per day at least 5 days per week? Potentially 6. The potential for patient to make measurable gains while on inpatient rehab is good and fair 7. Anticipated functional outcomes upon discharge from inpatient rehab are min assist and mod assist  with PT, min assist and mod assist with OT, supervision, min assist and mod assist with SLP. 8. Estimated rehab length of stay to reach the above functional goals is: potentially 20-30 days 9. Anticipated D/C setting: Home 10. Anticipated post D/C treatments: HH therapy and Outpatient therapy 11. Overall Rehab/Functional Prognosis: good and fair  RECOMMENDATIONS: This patient's condition is appropriate for continued rehabilitative care in the following setting: see below Patient has agreed to participate in recommended program. N/A Note that insurance prior authorization may be required for reimbursement for recommended care.  Comment: Pt suffered large stroke yesterday with associated edema. Will follow along for neurological and  functional improvements. If he demonstrates increased capacity to participate with therapy, we can explore an inpatient rehab admission. Thanks  Ranelle Oyster, MD, [facility] Physical Medicine [facility] Rehabilitation 07/24/2017   Mcarthur Rossetti Angiulli, PA-C 07/24/2017

## 2017-07-24 NOTE — Progress Notes (Signed)
Pt has just left to radiology for MR brain. No discomfort noted.

## 2017-07-24 NOTE — Progress Notes (Signed)
Inpatient Rehabilitation  Per PT request, patient was screened by Nykole Matos for appropriateness for an Inpatient Acute Rehab consult.  At this time we are recommending an Inpatient Rehab consult.  Text paged MD; please order if you are agreeable.    Zurich Carreno, M.A., CCC/SLP Admission Coordinator   Inpatient Rehabilitation  Cell [phone]  

## 2017-07-24 NOTE — Evaluation (Signed)
Physical Therapy Evaluation Patient Details Name: Noah RumpfJames Stratmann Jr. MRN: [id number] DOB: 1941/05/14 Today's Date: 07/24/2017   History of Present Illness  77 y.o. male presenting as a code stroke with difficulty walking, drooling, and facial droop. PMH is significant for HTN, Alzheimer dementia, CKD III, and T2DM.  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     Clinical Impression  Pt admitted with above diagnosis. Pt currently with functional limitations due to the deficits listed below (see PT Problem List). On eval, pt required +2 max assist bed mobility and max assist to maintain balance sitting EOB. Pt noted to have L side weakness, R gaze preference, and L neglect. He is right handed.  Increased time to arouse pt at beginning of session. Pt was medicated earlier this morning to undergo MRI. Lethargy from sedation meds may have affected his ability to participate in eval.  Pt will benefit from skilled PT to increase their independence and safety with mobility to allow discharge to the venue listed below.       Follow Up Recommendations CIR    Equipment Recommendations  Other (comment)(TBD)    Recommendations for Other Services Rehab consult     Precautions / Restrictions Precautions Precautions: Fall      Mobility  Bed Mobility Overal bed mobility: Needs Assistance Bed Mobility: Supine to Sit;Sit to Supine     Supine to sit: Max assist;HOB elevated Sit to supine: +2 for physical assistance;Max assist   General bed mobility comments: multi modal cues for sequencing, use of bed pad to scoot to EOB  Transfers                 General transfer comment: unable  Ambulation/Gait             General Gait Details: unable  Stairs            Wheelchair Mobility    Modified Rankin (Stroke Patients Only) Modified Rankin (Stroke Patients Only) Pre-Morbid Rankin Score: No significant disability Modified Rankin:  Severe disability     Balance Overall balance assessment: Needs assistance Sitting-balance support: Single extremity supported;Feet supported Sitting balance-Leahy Scale: Poor Sitting balance - Comments: max assist to maintain EOB sitting x 2 minutes Postural control: Left lateral lean                                   Pertinent Vitals/Pain Pain Assessment: No/denies pain    Home Living Family/patient expects to be discharged to:: Private residence Living Arrangements: Spouse/significant other;Other relatives Available Help at Discharge: Family Type of Home: House                Prior Function Level of Independence: Needs assistance   Gait / Transfers Assistance Needed: ambulatory     Comments: Pt is a poor historian. No family at bedside. Per chart, pt lived at home with wife and adult children. Ambulated independently. Pt has h/o dementia. Unsure if assist was needed for ADLs.      Hand Dominance   Dominant Hand: Right    Extremity/Trunk Assessment   Upper Extremity Assessment Upper Extremity Assessment: Defer to OT evaluation    Lower Extremity Assessment Lower Extremity Assessment: LLE deficits/detail LLE Deficits / Details: no active movement noted. No response to light touch/stimuli.        Communication   Communication: HOH  Cognition Arousal/Alertness: Awake/alert Behavior During Therapy: WFL for  tasks assessed/performed Overall Cognitive Status: No family/caregiver present to determine baseline cognitive functioning                                 General Comments: Oriented to self and place. Answers questions appropriately. Follows simple commands inconsistently. Able to have conversation about the [employer] basketball game.       General Comments General comments (skin integrity, edema, etc.): right gaze preference, right head deviation, unable to cross midline with eyes, left neglect    Exercises      Assessment/Plan    PT Assessment Patient needs continued PT services  PT Problem List Decreased strength;Decreased mobility;Decreased safety awareness;Decreased coordination;Decreased knowledge of precautions;Decreased activity tolerance;Impaired sensation;Decreased balance;Decreased knowledge of use of DME       PT Treatment Interventions DME instruction;Therapeutic activities;Gait training;Therapeutic exercise;Patient/family education;Balance training;Functional mobility training;Neuromuscular re-education    PT Goals (Current goals can be found in the Care Plan section)  Acute Rehab PT Goals Patient Stated Goal: not stated PT Goal Formulation: With patient Time For Goal Achievement: 08/07/17 Potential to Achieve Goals: Good    Frequency Min 4X/week   Barriers to discharge        Co-evaluation               AM-PAC PT "6 Clicks" Daily Activity  Outcome Measure Difficulty turning over in bed (including adjusting bedclothes, sheets and blankets)?: Unable Difficulty moving from lying on back to sitting on the side of the bed? : Unable Difficulty sitting down on and standing up from a chair with arms (e.g., wheelchair, bedside commode, etc,.)?: Unable Help needed moving to and from a bed to chair (including a wheelchair)?: Total Help needed walking in hospital room?: Total Help needed climbing 3-5 steps with a railing? : Total 6 Click Score: 6    End of Session   Activity Tolerance: Patient tolerated treatment well Patient left: in bed;with call bell/phone within reach;with bed alarm set;Other (comment)(with SLP) Nurse Communication: Mobility status PT Visit Diagnosis: Other abnormalities of gait and mobility (R26.89);Hemiplegia and hemiparesis Hemiplegia - Right/Left: Left Hemiplegia - dominant/non-dominant: Non-dominant    Time: 0981-1914 PT Time Calculation (min) (ACUTE ONLY): 16 min   Charges:   PT Evaluation $PT Eval Moderate Complexity: 1 Mod     PT G  Codes:        Aida Raider, PT  Office # [phone] Pager [phone]   Ilda Foil 07/24/2017, 11:06 AM

## 2017-07-24 NOTE — Progress Notes (Signed)
PT Cancellation Note  Patient Details Name: Noah RumpfJames Osten Jr. MRN: [id number] DOB: 03-04-1941   Cancelled Treatment:    Reason Eval/Treat Not Completed: Active bedrest order. Please update activity order, when appropriate, for PT to proceed with eval. Thank you.   Ilda FoilGarrow, Ramata Strothman Rene 07/24/2017, 8:28 AM

## 2017-07-24 NOTE — Progress Notes (Signed)
Inpatient Rehabilitation  Please see consult by Dr. Riley KillSwartz for full details.  He recommends we follow for increased ability to participate in and tolerate therapy.  We also await OT evaluation.  Plan to follow up Monday, 07/27/17.  Call if questions.   Charlane FerrettiMelissa Jadavion Spoelstra, M.A., CCC/SLP Admission Coordinator  [facility]  Cell [phone]

## 2017-07-24 NOTE — Progress Notes (Signed)
Pt had critical troponin lab result. Denies chest pain or any discomfort. MD on call was notified , no new orders were obtained. MD recommends that we monitor patient as there are series of orders for troponin in place.

## 2017-07-24 NOTE — Progress Notes (Addendum)
NEUROHOSPITALISTS STROKE TEAM - DAILY PROGRESS NOTE   ADMISSION HISTORY: [name] is a 77 year old African-American male who was brought in as a code stroke by [facility]. History is obtained from EMS as well as patient. Patient is a poor historian. He states that he fell yesterday evening but was able to go back to bed. This morning he fell again. His son arrived and noticed that he had right gaze and head deviation. They called his primary care physician in the event to the physician's office. The physician noticed she had left-sided weakness and called EMS who called a code stroke. Patient was noted having right gaze preference, right head deviation and left-sided weakness and facial weakness by EMS. He was met on the bridge upon arrival by me in the stroke team. CT scan of the head showed large subacute right posterior division MCA infarct as well as right basal ganglia hyperdensity. Aspect score was 4. Emergent CT angiogram showed severe stenosis of right middle cerebral artery with occlusion of posterior division. CT perfusion showed luxury perfusion and no significant cor3 or mismatch. Blood sugar was 108 mg percent by EMS and blood pressure is 138/94.  Last seen normal : unclear ? 07/22/17 pm Iv TPa : No outside time window Not a mech thrombectomy candidate due to large infarct and poor ASPECTs score of 4 NIHSS 8  SUBJECTIVE (INTERVAL HISTORY) No family is at the bedside. Patient is found laying in bed in NAD. Overall he feels his condition is unchanged. Voices no new complaints. No new events reported overnight.   OBJECTIVE Lab Results: CBC:  Recent Labs  Lab 07/23/17 1325 07/23/17 1334 07/24/17 0546  WBC 7.6  --  8.2  HGB 14.2 15.3 13.9  HCT 41.2 45.0 40.8  MCV 82.4  --  83.1  PLT 249  --  228   BMP: Recent Labs  Lab 07/23/17 1325 07/23/17 1334 07/24/17 0546  NA 135 138 138  K 5.6* 5.6* 5.6*  CL 108 108 110  CO2  17*  --  17*  GLUCOSE 107* 103* 102*  BUN 39* 39* 31*  CREATININE 1.77* 1.60* 1.65*  CALCIUM 9.6  --  9.4   Liver Function Tests:  Recent Labs  Lab 07/23/17 1325  AST 20  ALT 15*  ALKPHOS 50  BILITOT 0.7  PROT 7.7  ALBUMIN 3.9   Thyroid Function Studies:  Recent Labs    07/24/17 0722  TSH 2.026   Cardiac Enzymes:  Recent Labs  Lab 07/23/17 2146 07/24/17 0546 07/24/17 1015  TROPONINI 0.16* 0.16* 0.14*   Coagulation Studies:  Recent Labs    07/23/17 1325  APTT 34  INR 1.03   Urine Drug Screen:     Component Value Date/Time   LABOPIA NONE DETECTED 07/23/2017 0219   COCAINSCRNUR NONE DETECTED 07/23/2017 0219   COCAINSCRNUR NEGATIVE 04/03/2013 1924   LABBENZ NONE DETECTED 07/23/2017 0219   AMPHETMU NONE DETECTED 07/23/2017 0219   THCU NONE DETECTED 07/23/2017 0219   LABBARB NONE DETECTED 07/23/2017 0219    Alcohol Level:  Recent Labs  Lab 07/23/17 [place] <10    PHYSICAL EXAM Temp:  [98.1 F (36.7 C)-99.2 F (37.3 C)] 98.3 F (36.8 C) (02/22 1400) Pulse Rate:  [67-77] 67 (02/22 1400) Resp:  [12-20] 18 (02/22 1400) BP: (108-159)/(59-82) 111/59 (02/22 1400) SpO2:  [85 %-100 %] 100 % (02/22 1400) General - Well nourished, well developed, in no apparent distress HEENT-  Normocephalic,    Cardiovascular - Regular rate  and rhythm  Respiratory - Lungs clear bilaterally. No wheezing. Abdomen - soft and non-tender, BS normal Extremities- no edema or cyanosis Drowsy but can be aroused.. Follows commands. Oriented to place and person. Diminished attention, registration and recall. Right gaze and head deviation. Able to cross the midline to the left but not all the way. Blinks to threat on the right but not the left. Follows midline and some left-sided commands. Mild left-sided neglect. Mild left hemiparesis with 4/5 strength on the left upper and lower extremities. Suspect diminished sensation on the left but sensory inattention. Deep tendon reflexes 2+  symmetric. Plantars downgoing. Gait not tested. Finger-to-nose and knee to heal difficult to test on the left due to neglect  IMAGING: I have personally reviewed the radiological images below and agree with the radiology interpretations.  Mr Brain Wo Contrast Result Date: 07/24/2017 IMPRESSION: 1. Large acute on subacute RIGHT MCA territory infarct with petechial hemorrhage. 2. Regional RIGHT cerebrum mass effect without midline shift. Mildly effaced RIGHT lateral ventricle without LEFT ventricle entrapment. 3. Moderate chronic small vessel ischemic disease. Electronically Signed   By: Courtnay  Bloomer M.D.   On: 07/24/2017 05:19   Ct Angio Head W Or Wo Contrast Ct Angio Neck W Or Wo Contrast Ct Cerebral Perfusion W Contrast Result Date: 07/23/2017 IMPRESSION: Heavily calcified carotid bifurcation bilaterally with mild stenosis bilaterally. Heavily calcified moderate stenosis of the cavernous carotid bilaterally. Moderate stenosis proximal right M1 segment and distal right middle cerebral artery. Right middle cerebral arteries are diseased but patent. Mild left middle cerebral artery stenosis proximally and moderate stenosis distal left middle cerebral artery. Diffuse intracranial atherosclerotic disease with occlusion of the right posterior cerebral artery and diffuse disease in the left posterior cerebral artery. CT perfusion compatible with subacute infarct right MCA territory. CT perfusion under represents the size of the infarct based on CT Head compatible with subacute infarct and pseudo normalization. These results were reviewed in person at the time of interpretation on 07/23/2017 at 2:21 pm to Dr. PRAMOD SETHI , who verbally acknowledged these results. Electronically Signed   By: Charles  Clark M.D.   On: 07/23/2017 14:23   Ct Head Code Stroke Wo Contrast Result Date: 07/23/2017 IMPRESSION: 1. Right inferior MCA distribution late acute to subacute infarction involving temporal lobe, insula,  and basal ganglia. Mass effect with partial effacement of right lateral ventricle and minimal right uncal herniation. No significant midline shift. No appreciable hemorrhage. 2. Density of right distal M1 and proximal M2 branches may represent thrombus. 3. ASPECTS is 4 These results were communicated to Dr. Sethi at 1:46 pmon 2/21/2019by text page via the AMION messaging system. Electronically Signed   By: Lance  Furusawa-Stratton M.D.   On: 07/23/2017 13:47   Echocardiogram:                                               Study Conclusions - Left ventricle: The cavity size was normal. There was mild   concentric hypertrophy. Systolic function was mildly to   moderately reduced. The estimated ejection fraction was in the   range of 40% to 45%. Mild diffuse hypokinesis with distinct   regional wall motion abnormalities. Possible disproportionately   severe hypokinesis of the basal-mid inferior and inferoseptal   myocardium. Doppler parameters are consistent with abnormal left   ventricular relaxation (grade 1 diastolic dysfunction). Acoustic     contrast opacification revealed no evidence ofthrombus. - Ventricular septum: Septal motion showed paradox. These changes   are consistent with a left bundle branch block. - Left atrium: The atrium was mildly dilated.     IMPRESSION: Mr. Usbaldo Ambrose Jr. is a 76 y.o. male with PMH of HTN, HLD, DM, CKD who presents with acute onset  right gaze preference, right head deviation and left-sided weakness and facial weakness. MRI reveals:  Right inferior MCA distribution late acute to subacute infarction involving temporal lobe, insula, and basal ganglia, with petechial hemorrhage Mildly effaced RIGHT lateral ventricle without LEFT ventricle entrapment.  Suspected Etiology: likely large vessel intracranial atherosclerosis Resultant Symptoms:  right gaze preference, left-sided weakness, left facial weakness Stroke Risk Factors: diabetes mellitus, hyperlipidemia  and hypertension Other Stroke Risk Factors: Advanced age, Obesity, Body mass index is 38.22 kg/m. , CKD  Outstanding Stroke Work-up Studies:    Work up is completed.   PLAN  07/24/2017: Continue Aspirin/ Statin, for now Frequent neuro checks Telemetry monitoring PT/OT/SLP Consult PM & Rehab Consult Case Management /MSW May need outpatient TEE and Loop Recorder Placement to r/o AFIB Ongoing aggressive stroke risk factor management Patient's family will be counseled to be compliant with his antithrombotic medications Patient's family will be counseled on Lifestyle modifications including, Diet, Exercise, and Stress Follow up with [facility] in 6 weeks  INTRACRANIAL Atherosclerosis &Stenosis: May consider DAPT at discharge  DYSPHAGIA: NPO until passes SLP swallow evaluation Aspiration Precautions in progress  R/O AFIB: May need Outpatient TEE and Loop Recorder Placement  HYPERTENSION: Stable Permissive hypertension (OK if <220/120) for 24-48 hours post stroke and then gradually normalized within 5-7 days. Long term BP goal normotensive. May slowly start B/P medications after 48 hours, if necessary Home Meds: NONE  HYPERLIPIDEMIA:    Component Value Date/Time   CHOL 252 (H) 07/24/2017 0546   TRIG 103 07/24/2017 0546   HDL 46 07/24/2017 0546   CHOLHDL 5.5 07/24/2017 0546   VLDL 21 07/24/2017 0546   LDLCALC 185 (H) 07/24/2017 0546  Home Meds:  NONE LDL  goal < 70 Started on  Lipitor to 80 mg daily Continue statin at discharge  PRE- DIABETES: Lab Results  Component Value Date   HGBA1C 6.9 (H) 07/24/2017  HgbA1c goal < 7.0 Continue CBG monitoring and SSI to maintain glucose 140-180 mg/dl DM education   OBESITY Obesity, Body mass index is 38.22 kg/m. Greater than/equal to 30  Other Active Problems: Active Problems:   Cytotoxic brain edema (HCC)   Middle cerebral artery stenosis, right   Brain herniation (HCC)   Cerebrovascular accident (CVA)  (HCC)    Hospital day # 1 VTE prophylaxis: SCD's Diet : Fall precautions Diet NPO time specified   FAMILY UPDATES: No family at bedside  TEAM UPDATES: Chambliss, Marshall L, MD   Prior Home Stroke Medications:  No antithrombotic  Discharge Stroke Meds:  Please discharge patient on aspirin 300 mg suppository daily and FOR NOW   Disposition: 01-Home or Self Care Therapy Recs:               PENDING Follow Up:  Follow-up Information    Sethi, Pramod S, MD. Schedule an appointment as soon as possible for a visit in 6 week(s).   Specialties:  Neurology, Radiology Contact information: [address] [place] [address] [phone]          Patient, No Pcp Per -PCP Follow up in 1-2 weeks   Case Management aware of need     Assessment & plan discussed with with attending physician and they are in agreement.    Mary A Costello, ANP-C Stroke Neurology Team 07/24/2017 2:39 PM   07/24/2017 ASSESSMENT:   Patient remains drowsy with mild cytotoxic edema due to his large subacute right MCA stroke. Check swallow eval and patient is unable to swallow safely consider panda tube for feeding. Recommend dual antiplatelet therapy for his intracranial stenosis and statin and aggressive risk factor modification. Physical occupational therapy and speech therapy and rehabilitation consults. No family available at the bedside for discussion. Greater than 50% time during this 25 minute visit was spent on counseling and coordination of care about his stroke and planning treatment.   Pramod Sethi, MD Medical Director [facility] Pager: [phone] 07/24/2017 5:48 PM  To contact Stroke Continuity provider, please refer to [email or web address]. After hours, contact General Neurology   

## 2017-07-24 NOTE — Evaluation (Signed)
Occupational Therapy Evaluation Patient Details Name: Noah Bush. MRN: [id number] DOB: Jun 22, 1940 Today's Date: 07/24/2017    History of Present Illness 77 y.o. male presenting as a code stroke with difficulty walking, drooling, and facial droop. PMH is significant for HTN, Alzheimer dementia, CKD III, and T2DM.  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    Clinical Impression   Pt with limited ability to report PLOF, but states he was walking without a device and his son helped him shower. Pt presents with L visual field cut and inattention with L side weakness. He requires 2 person assist for bed level mobility and demonstrates poor sitting balance. Pt was able to brush his teeth with min assist and simulated eating with spoon with min assist. Pt is aware he is in the hospital and knows the date. He is disoriented to the situation. Will follow acutely. Pt is likely to need an extensive amount of rehab.    Follow Up Recommendations  CIR    Equipment Recommendations  3 in 1 bedside commode    Recommendations for Other Services       Precautions / Restrictions Precautions Precautions: Fall      Mobility Bed Mobility Overal bed mobility: Needs Assistance Bed Mobility: Supine to Sit;Sit to Supine     Supine to sit: Max assist;HOB elevated+2 Sit to supine: Max assist+2   General bed mobility comments: needing extensive cues and assist to achieve EOB  Transfers                 General transfer comment: unable    Balance Overall balance assessment: Needs assistance Sitting-balance support: Single extremity supported;Feet supported Sitting balance-Leahy Scale: Poor Sitting balance - Comments: max assist  Postural control: Left lateral lean                                 ADL either performed or assessed with clinical judgement   ADL Overall ADL's : Needs assistance/impaired Eating/Feeding: NPO;Bed  level Eating/Feeding Details (indicate cue type and reason): brought unloaded spoon to mouth with R hand Grooming: Oral care;Bed level;Minimal assistance Grooming Details (indicate cue type and reason): assist to set up and orient toothbrush, difficulty coordinating spitting Upper Body Bathing: Maximal assistance;Sitting   Lower Body Bathing: Total assistance;Sitting/lateral leans   Upper Body Dressing : Maximal assistance;Sitting   Lower Body Dressing: Total assistance;Bed level       Toileting- Clothing Manipulation and Hygiene: Total assistance;Bed level               Vision   Vision Assessment?: Yes Eye Alignment: Impaired (comment) Ocular Range of Motion: Restricted on the left Alignment/Gaze Preference: Gaze right Tracking/Visual Pursuits: Right eye does not track medially;Left eye does not track laterally Visual Fields: Left visual field deficit     Perception Perception Perception Tested?: Yes Perception Deficits: Inattention/neglect Inattention/Neglect: Does not attend to left visual field;Does not attend to left side of body   Praxis      Pertinent Vitals/Pain Pain Assessment: No/denies pain     Hand Dominance Right   Extremity/Trunk Assessment Upper Extremity Assessment Upper Extremity Assessment: LUE deficits/detail LUE Deficits / Details: 3-/5 shoulder and elbow, no wrist or hand movement noted, no pain response, movement only elicited with functional activity, not upon command LUE Sensation: decreased light touch;decreased proprioception(neglect) LUE Coordination: decreased fine motor;decreased gross motor   Lower Extremity Assessment Lower Extremity  Assessment: Defer to PT evaluation       Communication Communication Communication: HOH   Cognition Arousal/Alertness: Awake/alert Behavior During Therapy: Flat affect Overall Cognitive Status: Impaired/Different from baseline Area of Impairment: Attention;Orientation;Problem  solving;Memory;Safety/judgement                 Orientation Level: Disoriented to;Situation Current Attention Level: Sustained Memory: Decreased short-term memory   Safety/Judgement: Decreased awareness of deficits;Decreased awareness of safety   Problem Solving: Slow processing;Decreased initiation;Difficulty sequencing;Requires verbal cues;Requires tactile cues General Comments: pt initially lethargic, but became or conversant as he awakened. Pt likes car racing. Able to name his 4 children and wife.   General Comments       Exercises     Shoulder Instructions      Home Living Family/patient expects to be discharged to:: Private residence Living Arrangements: Spouse/significant other;Children Available Help at Discharge: Family Type of Home: House                                  Prior Functioning/Environment Level of Independence: Needs assistance  Gait / Transfers Assistance Needed: ambulatory without a device ADL's / Homemaking Assistance Needed: reports his son helps him shower in standing, wife does houskeeping   Comments: pt is a poor historian, no family bedside        OT Problem List: Decreased strength;Decreased activity tolerance;Impaired balance (sitting and/or standing);Impaired vision/perception;Decreased coordination;Decreased cognition;Decreased knowledge of use of DME or AE;Impaired sensation;Impaired UE functional use;Obesity      OT Treatment/Interventions: Self-care/ADL training;DME and/or AE instruction;Therapeutic activities;Cognitive remediation/compensation;Patient/family education;Balance training;Visual/perceptual remediation/compensation;Neuromuscular education    OT Goals(Current goals can be found in the care plan section) Acute Rehab OT Goals Patient Stated Goal: not stated OT Goal Formulation: Patient unable to participate in goal setting Time For Goal Achievement: 08/07/17 Potential to Achieve Goals: Good ADL Goals Pt  Will Perform Eating: with min assist;sitting;bed level(when cleared for POs) Pt Will Perform Grooming: with min assist;sitting(3 activities) Additional ADL Goal #1: Pt will turn his head 45 degrees to the L to locate visual targets 50% of time with multimodal cues. Additional ADL Goal #2: Pt will sit at EOB with min assist in preparation for ADL. Additional ADL Goal #3: Pt will locate L UE with R UE during mobility and ADL to position and protect from [email or web address]  OT Frequency: Min 3X/week   Barriers to D/C:            Co-evaluation              AM-PAC PT "6 Clicks" Daily Activity     Outcome Measure Help from another person eating meals?: Total Help from another person taking care of personal grooming?: A Little Help from another person toileting, which includes using toliet, bedpan, or urinal?: Total Help from another person bathing (including washing, rinsing, drying)?: A Lot Help from another person to put on and taking off regular upper body clothing?: A Lot Help from another person to put on and taking off regular lower body clothing?: Total 6 Click Score: 10   End of Session    Activity Tolerance: Patient tolerated treatment well Patient left: in bed;with call bell/phone within reach;with bed alarm set  OT Visit Diagnosis: Muscle weakness (generalized) (M62.81);Hemiplegia and hemiparesis;Other symptoms and signs involving cognitive function Hemiplegia - Right/Left: Left Hemiplegia - dominant/non-dominant: Non-Dominant Hemiplegia - caused by: Cerebral infarction  Time: [id number] OT Time Calculation (min): 27 min Charges:  OT General Charges $OT Visit: 1 Visit OT Evaluation $OT Eval Moderate Complexity: 1 Mod OT Treatments $Self Care/Home Management : 8-22 mins G-Codes:     07/24/2017 Martie RoundJulie Jeshua Ransford, OTR/L Pager: [phone]  Iran PlanasMayberry, [place] BailiffJulie Lynn 07/24/2017, 2:56 PM

## 2017-07-25 DIAGNOSIS — I679 Cerebrovascular disease, unspecified: Secondary | ICD-10-CM

## 2017-07-25 DIAGNOSIS — I10 Essential (primary) hypertension: Secondary | ICD-10-CM | POA: Diagnosis present

## 2017-07-25 DIAGNOSIS — I63231 Cerebral infarction due to unspecified occlusion or stenosis of right carotid arteries: Secondary | ICD-10-CM

## 2017-07-25 DIAGNOSIS — E785 Hyperlipidemia, unspecified: Secondary | ICD-10-CM | POA: Diagnosis present

## 2017-07-25 LAB — BASIC METABOLIC PANEL
ANION GAP: 12 (ref 5–15)
BUN: 27 mg/dL — AB (ref 6–20)
CALCIUM: 8.8 mg/dL — AB (ref 8.9–10.3)
CO2: 12 mmol/L — ABNORMAL LOW (ref 22–32)
Chloride: 115 mmol/L — ABNORMAL HIGH (ref 101–111)
Creatinine, Ser: 1.51 mg/dL — ABNORMAL HIGH (ref 0.61–1.24)
GFR calc Af Amer: 50 mL/min — ABNORMAL LOW (ref >60)
GFR, EST NON AFRICAN AMERICAN: 43 mL/min — AB (ref >60)
GLUCOSE: 79 mg/dL (ref 65–99)
Potassium: 5.2 mmol/L — ABNORMAL HIGH (ref 3.5–5.1)
Sodium: 139 mmol/L (ref 135–145)

## 2017-07-25 LAB — CBC
HCT: 37.8 % — ABNORMAL LOW (ref 39.0–52.0)
Hemoglobin: 12.5 g/dL — ABNORMAL LOW (ref 13.0–17.0)
MCH: 27.6 pg (ref 26.0–34.0)
MCHC: 33.1 g/dL (ref 30.0–36.0)
MCV: 83.4 fL (ref 78.0–100.0)
Platelets: 222 10*3/uL (ref 150–400)
RBC: 4.53 MIL/uL (ref 4.22–5.81)
RDW: 14.6 % (ref 11.5–15.5)
WBC: 8.2 10*3/uL (ref 4.0–10.5)

## 2017-07-25 LAB — GLUCOSE, CAPILLARY
GLUCOSE-CAPILLARY: 73 mg/dL (ref 65–99)
GLUCOSE-CAPILLARY: 109 mg/dL — AB (ref 65–99)
Glucose-Capillary: 77 mg/dL (ref 65–99)
Glucose-Capillary: 69 mg/dL (ref 65–99)
Glucose-Capillary: 103 mg/dL — ABNORMAL HIGH (ref 65–99)
Glucose-Capillary: 110 mg/dL — ABNORMAL HIGH (ref 65–99)

## 2017-07-25 MED ORDER — DEXTROSE-NACL 5-0.9 % IV SOLN
INTRAVENOUS | Status: DC
  Administered 2017-07-25: 13:00:00 via INTRAVENOUS

## 2017-07-25 MED ORDER — ASPIRIN 81 MG PO CHEW
324.0000 mg | CHEWABLE_TABLET | Freq: Every day | ORAL | Status: DC
  Administered 2017-07-26 – 2017-07-28 (×3): 324 mg via ORAL
  Filled 2017-07-25 (×3): qty 4

## 2017-07-25 MED ORDER — ASPIRIN 81 MG PO CHEW: 81.0000 mg | CHEWABLE_TABLET | Freq: Every day | ORAL | Status: DC

## 2017-07-25 MED ORDER — ALBUTEROL SULFATE (2.5 MG/3ML) 0.083% IN NEBU
2.5000 mg | INHALATION_SOLUTION | Freq: Four times a day (QID) | RESPIRATORY_TRACT | Status: DC | PRN
Start: 2017-07-25 — End: 2017-07-28

## 2017-07-25 MED ORDER — ASPIRIN 300 MG RE SUPP: 300.0000 mg | Freq: Every day | RECTAL | Status: DC

## 2017-07-25 MED ORDER — ASPIRIN EC 81 MG PO TBEC: 81.0000 mg | DELAYED_RELEASE_TABLET | Freq: Every day | ORAL | Status: DC

## 2017-07-25 MED ORDER — CLOPIDOGREL BISULFATE 75 MG PO TABS
75.0000 mg | ORAL_TABLET | Freq: Every day | ORAL | Status: DC
  Administered 2017-07-25 – 2017-07-28 (×4): 75 mg via ORAL
  Filled 2017-07-25 (×4): qty 1

## 2017-07-25 MED ORDER — SODIUM CHLORIDE 0.9 % IV SOLN
INTRAVENOUS | Status: DC
  Administered 2017-07-25: 21:00:00 via INTRAVENOUS
  Administered 2017-07-26: 1000 mL via INTRAVENOUS

## 2017-07-25 NOTE — Progress Notes (Signed)
Speech paged to re evaluate swallowing

## 2017-07-25 NOTE — Progress Notes (Signed)
PT Cancellation Note  Patient Details Name: Noah RumpfJames Maradiaga Jr. MRN: [id number] DOB: 09-06-1940   Cancelled Treatment:     Attempted to work with patient at 1100, patient too lethargic to participate in therapy at this time. Will re-attempt later this afternoon as time permits.  Etta GrandchildSean Bijan Ridgley, PT, DPT Acute Rehab Services Pager: [phone]     Etta GrandchildSean  Mardy Lucier 07/25/2017, 11:54 AM

## 2017-07-25 NOTE — Progress Notes (Addendum)
NEUROHOSPITALISTS STROKE TEAM - DAILY PROGRESS NOTE   ADMISSION HISTORY: [name] is a 77 year old African-American male who was brought in as a code stroke by [facility]. History is obtained from EMS as well as patient. Patient is a poor historian. He states that he fell yesterday evening but was able to go back to bed. This morning he fell again. His son arrived and noticed that he had right gaze and head deviation. They called his primary care physician in the event to the physician's office. The physician noticed she had left-sided weakness and called EMS who called a code stroke. Patient was noted having right gaze preference, right head deviation and left-sided weakness and facial weakness by EMS. He was met on the bridge upon arrival by me in the stroke team. CT scan of the head showed large subacute right posterior division MCA infarct as well as right basal ganglia hyperdensity. Aspect score was 4. Emergent CT angiogram showed severe stenosis of right middle cerebral artery with occlusion of posterior division. CT perfusion showed luxury perfusion and no significant cor3 or mismatch. Blood sugar was 108 mg percent by EMS and blood pressure is 138/94.  Last seen normal : unclear ? 07/22/17 pm Iv TPa : No outside time window Not a mech thrombectomy candidate due to large infarct and poor ASPECTs score of 4 NIHSS 8  SUBJECTIVE (INTERVAL HISTORY) No family is at the bedside. Patient is found laying in bed in NAD. Overall he feels hiscondition is unchanged. Continue to have significant Left sided weakness. Voices no new complaints. No new events reported overnight. Needs SLP to re-evaluate swallow.   OBJECTIVE Lab Results: CBC:  Recent Labs  Lab 07/23/17 1325 07/23/17 1334 07/24/17 0546 07/25/17 0314  WBC 7.6  --  8.2 8.2  HGB 14.2 15.3 13.9 12.5*  HCT 41.2 45.0 40.8 37.8*  MCV 82.4  --  83.1 83.4  PLT 249  --  228 222    BMP: Recent Labs  Lab 07/23/17 1325 07/23/17 1334 07/24/17 0546 07/24/17 1416 07/25/17 0314  NA 135 138 138 141 139  K 5.6* 5.6* 5.6* 5.6* 5.2*  CL 108 108 110 113* 115*  CO2 17*  --  17* 19* 12*  GLUCOSE 107* 103* 102* 80 79  BUN 39* 39* 31* 29* 27*  CREATININE 1.77* 1.60* 1.65* 1.62* 1.51*  CALCIUM 9.6  --  9.4 9.2 8.8*   Liver Function Tests:  Recent Labs  Lab 07/23/17 1325  AST 20  ALT 15*  ALKPHOS 50  BILITOT 0.7  PROT 7.7  ALBUMIN 3.9   Thyroid Function Studies:  Recent Labs    07/24/17 0722  TSH 2.026   Cardiac Enzymes:  Recent Labs  Lab 07/23/17 2146 07/24/17 0546 07/24/17 1015  TROPONINI 0.16* 0.16* 0.14*   Coagulation Studies:  Recent Labs    07/23/17 1325  APTT 34  INR 1.03   Urine Drug Screen:     Component Value Date/Time   LABOPIA NONE DETECTED 07/23/2017 0219   COCAINSCRNUR NONE DETECTED 07/23/2017 0219   COCAINSCRNUR NEGATIVE 04/03/2013 1924   LABBENZ NONE DETECTED 07/23/2017 0219   AMPHETMU NONE DETECTED 07/23/2017 0219   THCU NONE DETECTED 07/23/2017 0219   LABBARB NONE DETECTED 07/23/2017 0219    Alcohol Level:  Recent Labs  Lab 07/23/17 1638  ETH <10    PHYSICAL EXAM Temp:  [98.3 F (36.8 C)-99.1 F (37.3 C)] 98.5 F (36.9 C) (02/23 1018) Pulse Rate:  [64-77] 64 (02/23 1018) Resp:  [  18-20] 20 (02/23 1018) BP: (110-142)/(44-96) 122/96 (02/23 1018) SpO2:  [92 %-100 %] 100 % (02/23 1018) General - Well nourished, well developed, in no apparent distress HEENT-  Normocephalic,    Cardiovascular - Regular rate and rhythm  Respiratory - Lungs clear bilaterally. No wheezing. Abdomen - soft and non-tender, BS normal Extremities- no edema or cyanosis Drowsy but can be aroused.. Follows commands. Oriented to place and person. Diminished attention, registration and recall. Right gaze and head deviation. Able to cross the midline to the left but not all the way. Blinks to threat on the right but not the left. Follows midline  and some left-sided commands. Mild left-sided neglect. Mild left hemiparesis with 4/5 strength on the left upper and lower extremities. Suspect diminished sensation on the left but sensory inattention. Deep tendon reflexes 2+ symmetric. Plantars downgoing. Gait not tested. Finger-to-nose and knee to heal difficult to test on the left due to neglect  IMAGING: I have personally reviewed the radiological images below and agree with the radiology interpretations.  Mr Brain Wo Contrast Result Date: 07/24/2017 IMPRESSION: 1. Large acute on subacute RIGHT MCA territory infarct with petechial hemorrhage. 2. Regional RIGHT cerebrum mass effect without midline shift. Mildly effaced RIGHT lateral ventricle without LEFT ventricle entrapment. 3. Moderate chronic small vessel ischemic disease. Electronically Signed   By: Elon Alas M.D.   On: 07/24/2017 05:19   Ct Angio Head W Or Wo Contrast Ct Angio Neck W Or Wo Contrast Ct Cerebral Perfusion W Contrast Result Date: 07/23/2017 IMPRESSION: Heavily calcified carotid bifurcation bilaterally with mild stenosis bilaterally. Heavily calcified moderate stenosis of the cavernous carotid bilaterally. Moderate stenosis proximal right M1 segment and distal right middle cerebral artery. Right middle cerebral arteries are diseased but patent. Mild left middle cerebral artery stenosis proximally and moderate stenosis distal left middle cerebral artery. Diffuse intracranial atherosclerotic disease with occlusion of the right posterior cerebral artery and diffuse disease in the left posterior cerebral artery. CT perfusion compatible with subacute infarct right MCA territory. CT perfusion under represents the size of the infarct based on CT Head compatible with subacute infarct and pseudo normalization. These results were reviewed in person at the time of interpretation on 07/23/2017 at 2:21 pm to Dr. Antony Contras , who verbally acknowledged these results. Electronically Signed    By: Franchot Gallo M.D.   On: 07/23/2017 14:23   Ct Head Code Stroke Wo Contrast Result Date: 07/23/2017 IMPRESSION: 1. Right inferior MCA distribution late acute to subacute infarction involving temporal lobe, insula, and basal ganglia. Mass effect with partial effacement of right lateral ventricle and minimal right uncal herniation. No significant midline shift. No appreciable hemorrhage. 2. Density of right distal M1 and proximal M2 branches may represent thrombus. 3. ASPECTS is 4 These results were communicated to Dr. Leonie Man at [place] 2/21/2019by text page via the [facility] messaging system. Electronically Signed   By: Kristine Garbe M.D.   On: 07/23/2017 13:47   Echocardiogram:                                               Study Conclusions - Left ventricle: The cavity size was normal. There was mild   concentric hypertrophy. Systolic function was mildly to   moderately reduced. The estimated ejection fraction was in the   range of 40% to 45%. Mild diffuse hypokinesis with distinct  regional wall motion abnormalities. Possible disproportionately   severe hypokinesis of the basal-mid inferior and inferoseptal   myocardium. Doppler parameters are consistent with abnormal left   ventricular relaxation (grade 1 diastolic dysfunction). Acoustic   contrast opacification revealed no evidence ofthrombus. - Ventricular septum: Septal motion showed paradox. These changes   are consistent with a left bundle branch block. - Left atrium: The atrium was mildly dilated.     IMPRESSION: [name]. is a 77 y.o. male with PMH of HTN, HLD, DM, CKD who presents with acute onset  right gaze preference, right head deviation and left-sided weakness and facial weakness. MRI reveals:  Right inferior MCA distribution late acute to subacute infarction involving temporal lobe, insula, and basal ganglia, with petechial hemorrhage Mildly effaced RIGHT lateral ventricle without LEFT ventricle  entrapment.  Suspected Etiology: likely large vessel intracranial atherosclerosis Resultant Symptoms:  right gaze preference, left-sided weakness, left facial weakness Stroke Risk Factors: diabetes mellitus, hyperlipidemia and hypertension Other Stroke Risk Factors: Advanced age, Obesity, Body mass index is 38.22 kg/m. , CKD  Outstanding Stroke Work-up Studies:    Work up is completed.   PLAN  07/25/2017: Continue Aspirin/ Plavix/ Statin Frequent neuro checks Telemetry monitoring PT/OT/SLP Consult PM & Rehab Consult Case Management /MSW 74 Day cardiac event monitor to r/o AFIB, at discharge Ongoing aggressive stroke risk factor management Patient's family will be counseled to be compliant with his antithrombotic medications Patient's family will be counseled on Lifestyle modifications including, Diet, Exercise, and Stress Follow up with [facility] in 6 weeks  INTRACRANIAL Atherosclerosis &Stenosis: DAPT once patient passes SLP evaluation or Cortrak placed  DYSPHAGIA: NPO until passes SLP swallow evaluation Aspiration Precautions in progress  R/O AFIB: 30 day event monitor at discharge  HYPERTENSION: Stable Permissive hypertension (OK if <220/120) for 24-48 hours post stroke and then gradually normalized within 5-7 days. Long term BP goal normotensive. May slowly start B/P medications after 48 hours, if necessary Home Meds: NONE  HYPERLIPIDEMIA:    Component Value Date/Time   CHOL 252 (H) 07/24/2017 0546   TRIG 103 07/24/2017 0546   HDL 46 07/24/2017 0546   CHOLHDL 5.5 07/24/2017 0546   VLDL 21 07/24/2017 0546   LDLCALC 185 (H) 07/24/2017 0546  Home Meds:  NONE LDL  goal < 70 Started on  Lipitor to 80 mg daily Continue statin at discharge  PRE- DIABETES: Lab Results  Component Value Date   HGBA1C 6.9 (H) 07/24/2017  HgbA1c goal < 7.0 Continue CBG monitoring and SSI to maintain glucose 140-180 mg/dl DM education   OBESITY Obesity, Body mass  index is 38.22 kg/m. Greater than/equal to 30  Other Active Problems: Active Problems:   Cytotoxic brain edema (HCC)   Middle cerebral artery stenosis, right   Brain herniation ([place])   Cerebrovascular accident (CVA) [facility])    Hospital day # 2 VTE prophylaxis: SCD's Diet : Fall precautions Diet NPO time specified   FAMILY UPDATES: No family at bedside  TEAM UPDATES: Lind Covert, MD   Prior Home Stroke Medications:  No antithrombotic  Discharge Stroke Meds:  Please discharge patient on ASA 81 mg daily and Plavix 75 mg daily.   If any AFIB noted on telemetry monitoring please reconsult Neurology for [facility] therapy recommendations  Disposition: 01-Home or Self Care Therapy Recs:               CIR on Monday Follow Up:  Follow-up Information    Garvin Fila, MD. Schedule  an appointment as soon as possible for a visit in 6 week(s).   Specialties:  Neurology, Radiology Contact information: [address] [place] [address] [phone]          Patient, No Pcp Per -PCP Follow up in 1-2 weeks   Case Management aware of need   Assessment & plan discussed with with attending physician and they are in agreement.    Mary Sella, ANP-C Stroke Neurology Team 07/25/2017 12:47 PM   07/25/2017 ASSESSMENT:   I reviewed above note and agree with the assessment and plan. I have made any additions or clarifications directly to the above note. Pt was seen and examined.   77 year old male with history of CKD, hypertension, gout admitted for fell twice, right-sided gaze, head deviation to the right, left-sided weakness and left facial droop.  CT and MRI showed right MCA large cortical infarct, right CR and right caudate head infarct also.  CTA head and neck showed severe atherosclerosis diffusely, including bilateral siphon, bilateral ICA, right PCA and right M1.  EF 40-45%, A1c 6.9, LDL 185.  Patient stroke likely due to intracranial stenosis given imaging  findings and risk factors.  However, due to advanced age, atrial fibrillation cannot be completely ruled out.  However, patient currently not candidate for TEE or loop recorder, recommend 30-day cardio event monitor as outpatient to rule out A. fib.  Patient still has right-sided gaze, left hemi-neglect, and NIHSS = 22.  Prognosis likely poor, need aggressive PT/OT.  Did recommend CIR versus SNF at this time.  Currently, patient passed swallow, put on aggressive medical treatment for intracranial stenosis including aspirin 325, Plavix 75 and Lipitor 80.  Dual antiplatelet plan for 3 months and then Plavix alone.  Neurology will sign off. Please call with questions. Pt will follow up with Dr. Leonie Man at [facility] in about 6 weeks. Thanks for the consult.   Rosalin Hawking, MD PhD Stroke Neurology 07/25/2017 4:33 PM   To contact Stroke Continuity provider, please refer to [email or web address] After hours, contact General Neurology

## 2017-07-25 NOTE — Progress Notes (Signed)
Family Medicine Teaching Service Daily Progress Note Intern Pager: [phone]  Patient name: Noah Bush. Medical record number: [id number] Date of birth: 04-18-1941 Age: 77 y.o. Gender: male  Primary Care Provider: Patient, No Pcp Per Consultants: Neurology, PT/OT/SLP, CIR Code Status: Full   Pt Overview and Major Events to Date:  Admitted to FPTS on 07/23/17  Assessment and Plan: Jenaro Souder Jr.is a 76 y.o.malepresenting as a code strokewithdifficulty walking, drooling, and facial droop. PMH is significant forHTN, Alzheimer dementia, CKD III, and T2DM  Subacute R MCA Infarct On exam, patient with dysarthric speech but improved form 07/24/2017, and L sided weakness. Poor neuro exam, but likely due to patient effort. No longer reporting sensory deficit. Neurology consulted, recommend possible panda tube for feedings pending swallow eval, dual antiplatelet therapy and statin, aggressive risk factor modification. Echocardiogram showing mild diffuse hypokinesis with distinct regional wall motion abnormalities. LVEF 40-45%. -neurology consulted, appreciate recommendations  -plan for DAPT with ASA and clopidigrel x 3 mo pending passing swallow evaluation  -permissive HTN for 24-48 hrs -SLP, PT, and OT consults placed. Patient has active bed rest order and may get up with assistance.  -NS @125cc /hr -will re-assess neuro status in afternoon  Elevated Troponin Stable. Initial istat troponin elevated to 0.27, with trended troponin elevated to 0.16>0.16>0.14. EKG on 07/23/17 in NSR and known LBBB, with am EKG showing similar findings.   Hyperkalemia Improving with IVF. Unclear etiology at this time, possibly related to medications. Urinary retention also a possible etiology given patient has a prescription for flomax 0.4mg  daily (that has not filled since December 2018) and doxazosin 4mg  daily (per [facility]). K 5.6 on admission, current K 5.2. Patient denies symptoms and EKG without  significant changes.  -IVF as above -monitor on daily BMP  Type 2 Diabetes A1C on 07/24/17 of 6.9 patient on one home oral med but family unable to recall. Per discussion with [facility] home medications include: glipizide ER 2.5mg  daily -sSSI and CBGs  HTN Allowing permissive HTN for 24-48 hrs post stroke, BP of 142/65. Unsure of home meds; patient has not been taking any meds for 5 days and family reports BP was "very high" at last clinic visit. Per discussion with [facility] home medications include: lisinopril-HCTZ 20/12.5 daily -permissive HTN after 24-48 hours -Can slowly plan to resume home antihypertensives pending swallow study   CKD III Current Cr 1.51. Per chart review, baseline likely Cr ~1.6-1.8.   -avoid nephrotoxic agents  Alzheimer's dementia Per discussion with [facility], medications are as follows: memantine daily   FEN/GI:NPO untilformalspeech eval; NS @125cc /hr Prophylaxis:SCDs until MRI confirms no hemorrhagic element to stroke  Disposition: continued inpatient stay, will likely discharge to CIR when medically stable  Subjective:  Patient states he is "doing fine" and able to hold conversation. Patient very upset that he is NPO and addiment that he wants water. Denies SOB, CP, nausea, or vomiting.   Objective: Temp:  [98.3 F (36.8 C)-99.1 F (37.3 C)] 98.5 F (36.9 C) (02/23 1018) Pulse Rate:  [64-77] 64 (02/23 1018) Resp:  [18-20] 20 (02/23 1018) BP: (110-142)/(44-96) 122/96 (02/23 1018) SpO2:  [92 %-100 %] 100 % (02/23 1018) Physical Exam: General: awake and alert, laying in bed, keeping eyes closed throughout exam  Cardiovascular: RRR, no MRG  Respiratory: CTAB, no wheezes, rales or rhonchi  Abdomen: soft, non tender, non distended, bowel sounds normal  Extremities: no edema, difficult to assess strength given patient effort, 4/5 muscle strength in right lower extremty, 2/5 muscle  strength in left lower extremity, normal grip  strength in right upper extremity, minimal grip strength in left lower extremity  Neuro: difficult to assess neurological status given lack of patient effort, sensation intact bilaterally, would not follow commands stating he would follow commands only if he got water. Not oriented to person, place, or time   Laboratory: Recent Labs  Lab 07/23/17 1325 07/23/17 1334 07/24/17 0546 07/25/17 0314  WBC 7.6  --  8.2 8.2  HGB 14.2 15.3 13.9 12.5*  HCT 41.2 45.0 40.8 37.8*  PLT 249  --  228 222   Recent Labs  Lab 07/23/17 1325  07/24/17 0546 07/24/17 1416 07/25/17 0314  NA 135   < > 138 141 139  K 5.6*   < > 5.6* 5.6* 5.2*  CL 108   < > 110 113* 115*  CO2 17*  --  17* 19* 12*  BUN 39*   < > 31* 29* 27*  CREATININE 1.77*   < > 1.65* 1.62* 1.51*  CALCIUM 9.6  --  9.4 9.2 8.8*  PROT 7.7  --   --   --   --   BILITOT 0.7  --   --   --   --   ALKPHOS 50  --   --   --   --   ALT 15*  --   --   --   --   AST 20  --   --   --   --   GLUCOSE 107*   < > 102* 80 79   < > = values in this interval not displayed.    CBG (last 3)  Recent Labs    07/24/17 1954 07/25/17 0423 07/25/17 0805  GLUCAP 70 73 77     Imaging/Diagnostic Tests: Ct Angio Head W Or Wo Contrast  Result Date: 07/23/2017 CLINICAL DATA:  Stroke. EXAM: CT ANGIOGRAPHY HEAD AND NECK CT PERFUSION BRAIN TECHNIQUE: Multidetector CT imaging of the head and neck was performed using the standard protocol during bolus administration of intravenous contrast. Multiplanar CT image reconstructions and MIPs were obtained to evaluate the vascular anatomy. Carotid stenosis measurements (when applicable) are obtained utilizing NASCET criteria, using the distal internal carotid diameter as the denominator. Multiphase CT imaging of the brain was performed following IV bolus contrast injection. Subsequent parametric perfusion maps were calculated using RAPID software. CONTRAST:  90mL ISOVUE-370 IOPAMIDOL (ISOVUE-370) INJECTION 76% COMPARISON:   CT head 07/23/2017 FINDINGS: CTA NECK FINDINGS Aortic arch: Atherosclerotic disease in the aortic arch. 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 Ascending aorta measures 37 mm in diameter. Atherosclerotic disease in the proximal great vessels which are patent. Right carotid system: Calcified plaque right carotid bifurcation. Less than 25% diameter stenosis right internal carotid artery at the origin. Left carotid system: Atherosclerotic disease at the proximal left common carotid artery which is mildly narrowed. Atherosclerotic calcification left carotid bifurcation with less than 25% diameter stenosis. Diffusely diseased left common carotid artery Vertebral arteries: Right vertebral artery dominant and patent to the basilar. Calcified plaque at the origin. Small left vertebral artery ends in PICA. Skeleton: Cervical degenerative change. No acute skeletal abnormality. Other neck: Negative for mass or adenopathy. Upper chest: Negative Review of the MIP images confirms the above findings CTA HEAD FINDINGS Anterior circulation: Cavernous carotid is heavily calcified bilaterally with moderate stenosis right greater than left Moderate stenosis proximal right M1 segment. Moderate disease right middle cerebral artery bifurcation with moderate stenosis at the origin of the posterior division  right MCA. Anterior division of the right middle cerebral artery irregular but patent. Mild stenosis left M1 segment. Moderate stenosis distal left M1. Left middle cerebral artery is patent with scattered atherosclerotic disease. Both anterior cerebral arteries patent Posterior circulation: Mild stenosis distal right vertebral artery which supplies the basilar. Left vertebral artery ends in PICA. PICA patent bilaterally. Basilar widely patent. Superior cerebellar arteries patent bilaterally. Fetal origin right posterior cerebral artery. Occlusion distal right posterior cerebral artery. Diffuse atherosclerotic disease left  posterior cerebral artery. Venous sinuses: Patent Anatomic variants: None Delayed phase: Not perform Review of the MIP images confirms the above findings CT Brain Perfusion Findings: CBF (<30%) Volume: 28mL Perfusion (Tmax>6.0s) volume: 31mL Mismatch Volume: 3mL Infarction Location:Right MCA territory involving the right temporal lobe and right parietal lobe. CT perfusion volume is smaller than that seen on the recent head CT. This may be due to subacute infarct and pseudo normalization of the CT perfusion. Overall, the CT scan is felt to be more accurate representation of the large territory right MCA infarct. IMPRESSION: Heavily calcified carotid bifurcation bilaterally with mild stenosis bilaterally. Heavily calcified moderate stenosis of the cavernous carotid bilaterally. Moderate stenosis proximal right M1 segment and distal right middle cerebral artery. Right middle cerebral arteries are diseased but patent. Mild left middle cerebral artery stenosis proximally and moderate stenosis distal left middle cerebral artery. Diffuse intracranial atherosclerotic disease with occlusion of the right posterior cerebral artery and diffuse disease in the left posterior cerebral artery. CT perfusion compatible with subacute infarct right MCA territory. CT perfusion under represents the size of the infarct based on CT Head compatible with subacute infarct and pseudo normalization. These results were reviewed in person at the time of interpretation on 07/23/2017 at 2:21 pm to Dr. Delia HeadyPRAMOD SETHI , who verbally acknowledged these results. Electronically Signed   By: Marlan Palauharles  Clark M.D.   On: 07/23/2017 14:23   Ct Angio Neck W Or Wo Contrast  Result Date: 07/23/2017 CLINICAL DATA:  Stroke. EXAM: CT ANGIOGRAPHY HEAD AND NECK CT PERFUSION BRAIN TECHNIQUE: Multidetector CT imaging of the head and neck was performed using the standard protocol during bolus administration of intravenous contrast. Multiplanar CT image reconstructions  and MIPs were obtained to evaluate the vascular anatomy. Carotid stenosis measurements (when applicable) are obtained utilizing NASCET criteria, using the distal internal carotid diameter as the denominator. Multiphase CT imaging of the brain was performed following IV bolus contrast injection. Subsequent parametric perfusion maps were calculated using RAPID software. CONTRAST:  90mL ISOVUE-370 IOPAMIDOL (ISOVUE-370) INJECTION 76% COMPARISON:  CT head 07/23/2017 FINDINGS: CTA NECK FINDINGS Aortic arch: Atherosclerotic disease in the aortic arch. 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 Ascending aorta measures 37 mm in diameter. Atherosclerotic disease in the proximal great vessels which are patent. Right carotid system: Calcified plaque right carotid bifurcation. Less than 25% diameter stenosis right internal carotid artery at the origin. Left carotid system: Atherosclerotic disease at the proximal left common carotid artery which is mildly narrowed. Atherosclerotic calcification left carotid bifurcation with less than 25% diameter stenosis. Diffusely diseased left common carotid artery Vertebral arteries: Right vertebral artery dominant and patent to the basilar. Calcified plaque at the origin. Small left vertebral artery ends in PICA. Skeleton: Cervical degenerative change. No acute skeletal abnormality. Other neck: Negative for mass or adenopathy. Upper chest: Negative Review of the MIP images confirms the above findings CTA HEAD FINDINGS Anterior circulation: Cavernous carotid is heavily calcified bilaterally with moderate stenosis right greater than left Moderate stenosis proximal right M1  segment. Moderate disease right middle cerebral artery bifurcation with moderate stenosis at the origin of the posterior division right MCA. Anterior division of the right middle cerebral artery irregular but patent. Mild stenosis left M1 segment. Moderate stenosis distal left M1. Left middle cerebral artery is patent  with scattered atherosclerotic disease. Both anterior cerebral arteries patent Posterior circulation: Mild stenosis distal right vertebral artery which supplies the basilar. Left vertebral artery ends in PICA. PICA patent bilaterally. Basilar widely patent. Superior cerebellar arteries patent bilaterally. Fetal origin right posterior cerebral artery. Occlusion distal right posterior cerebral artery. Diffuse atherosclerotic disease left posterior cerebral artery. Venous sinuses: Patent Anatomic variants: None Delayed phase: Not perform Review of the MIP images confirms the above findings CT Brain Perfusion Findings: CBF (<30%) Volume: 28mL Perfusion (Tmax>6.0s) volume: 31mL Mismatch Volume: 3mL Infarction Location:Right MCA territory involving the right temporal lobe and right parietal lobe. CT perfusion volume is smaller than that seen on the recent head CT. This may be due to subacute infarct and pseudo normalization of the CT perfusion. Overall, the CT scan is felt to be more accurate representation of the large territory right MCA infarct. IMPRESSION: Heavily calcified carotid bifurcation bilaterally with mild stenosis bilaterally. Heavily calcified moderate stenosis of the cavernous carotid bilaterally. Moderate stenosis proximal right M1 segment and distal right middle cerebral artery. Right middle cerebral arteries are diseased but patent. Mild left middle cerebral artery stenosis proximally and moderate stenosis distal left middle cerebral artery. Diffuse intracranial atherosclerotic disease with occlusion of the right posterior cerebral artery and diffuse disease in the left posterior cerebral artery. CT perfusion compatible with subacute infarct right MCA territory. CT perfusion under represents the size of the infarct based on CT Head compatible with subacute infarct and pseudo normalization. These results were reviewed in person at the time of interpretation on 07/23/2017 at 2:21 pm to Dr. Delia Heady ,  who verbally acknowledged these results. Electronically Signed   By: Marlan Palau M.D.   On: 07/23/2017 14:23   Mr Brain Wo Contrast  Result Date: 07/24/2017 CLINICAL DATA:  Follow-up code stroke. Subacute RIGHT MCA infarct. Difficulty walking, facial droop. History of Alzheimer's disease, advanced intracranial atherosclerosis. EXAM: MRI HEAD WITHOUT CONTRAST TECHNIQUE: Multiplanar, multiecho pulse sequences of the brain and surrounding structures were obtained without intravenous contrast. COMPARISON:  CT HEAD July 23, 2017 FINDINGS: INTRACRANIAL CONTENTS: Reduced diffusion and susceptibility artifact RIGHT frontotemporal parietal cortex and, RIGHT basal ganglia with patchy low ADC values RIGHT lenticulostriate nucleus, superior aspect RIGHT frontotemporal parietal cortex. Regional mass effect without midline shift. Mild effacement RIGHT lateral ventricle without LEFT ventricle entrapment. Patchy to confluent supratentorial white matter FLAIR T2 hyperintensities exclusive a aforementioned abnormality. VASCULAR: Normal major intracranial vascular flow voids present at skull base. SKULL AND UPPER CERVICAL SPINE: No abnormal sellar expansion. No suspicious calvarial bone marrow signal. Craniocervical junction maintained. SINUSES/ORBITS: Trace paranasal sinus mucosal thickening. Mastoid air cells are well aerated. Included ocular globes and orbital contents are non-suspicious. OTHER: None. IMPRESSION: 1. Large acute on subacute RIGHT MCA territory infarct with petechial hemorrhage. 2. Regional RIGHT cerebrum mass effect without midline shift. Mildly effaced RIGHT lateral ventricle without LEFT ventricle entrapment. 3. Moderate chronic small vessel ischemic disease. Electronically Signed   By: Awilda Metro M.D.   On: 07/24/2017 05:19   Ct Abdomen Pelvis W Contrast  Result Date: 06/29/2017 CLINICAL DATA:  Lower abdominal pain. EXAM: CT ABDOMEN AND PELVIS WITH CONTRAST TECHNIQUE: Multidetector CT  imaging of the abdomen and pelvis was performed using the  standard protocol following bolus administration of intravenous contrast. CONTRAST:  ISOVUE-300 IOPAMIDOL (ISOVUE-300) INJECTION 61% COMPARISON:  01/29/2013 FINDINGS: Lower chest: Linear bibasilar scarring. No effusions. Coronary artery calcifications. Heart is borderline in size. Hepatobiliary: Layering gallstones within the gallbladder. A small hypodensity in the right hepatic lobe is stable since prior study and likely reflects a small cyst. No biliary ductal dilatation. Pancreas: No focal abnormality or ductal dilatation. Spleen: No focal abnormality.  Normal size. Adrenals/Urinary Tract: Several right renal cysts. No hydronephrosis bilaterally. Adrenal glands and urinary bladder unremarkable. Stomach/Bowel: Sigmoid diverticulosis. No active diverticulitis. Appendix is normal. Stomach and small bowel decompressed, unremarkable. Vascular/Lymphatic: Diffuse aortic and iliac calcifications. No aneurysm or adenopathy. Reproductive: Central prostate calcifications. Other: No free fluid or free air. Musculoskeletal: No acute bony abnormality. Chronic appearing compression fractures at T12 and L1. IMPRESSION: Bibasilar scarring. Layering high-density material within the gallbladder, likely small stones. Sigmoid diverticulosis.  No active diverticulitis. Aortoiliac atherosclerosis. No acute findings in the abdomen or pelvis. Electronically Signed   By: Charlett Nose M.D.   On: 06/29/2017 22:26   Ct Cerebral Perfusion W Contrast  Result Date: 07/23/2017 CLINICAL DATA:  Stroke. EXAM: CT ANGIOGRAPHY HEAD AND NECK CT PERFUSION BRAIN TECHNIQUE: Multidetector CT imaging of the head and neck was performed using the standard protocol during bolus administration of intravenous contrast. Multiplanar CT image reconstructions and MIPs were obtained to evaluate the vascular anatomy. Carotid stenosis measurements (when applicable) are obtained utilizing NASCET  criteria, using the distal internal carotid diameter as the denominator. Multiphase CT imaging of the brain was performed following IV bolus contrast injection. Subsequent parametric perfusion maps were calculated using RAPID software. CONTRAST:  90mL ISOVUE-370 IOPAMIDOL (ISOVUE-370) INJECTION 76% COMPARISON:  CT head 07/23/2017 FINDINGS: CTA NECK FINDINGS Aortic arch: Atherosclerotic disease in the aortic arch. 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 Ascending aorta measures 37 mm in diameter. Atherosclerotic disease in the proximal great vessels which are patent. Right carotid system: Calcified plaque right carotid bifurcation. Less than 25% diameter stenosis right internal carotid artery at the origin. Left carotid system: Atherosclerotic disease at the proximal left common carotid artery which is mildly narrowed. Atherosclerotic calcification left carotid bifurcation with less than 25% diameter stenosis. Diffusely diseased left common carotid artery Vertebral arteries: Right vertebral artery dominant and patent to the basilar. Calcified plaque at the origin. Small left vertebral artery ends in PICA. Skeleton: Cervical degenerative change. No acute skeletal abnormality. Other neck: Negative for mass or adenopathy. Upper chest: Negative Review of the MIP images confirms the above findings CTA HEAD FINDINGS Anterior circulation: Cavernous carotid is heavily calcified bilaterally with moderate stenosis right greater than left Moderate stenosis proximal right M1 segment. Moderate disease right middle cerebral artery bifurcation with moderate stenosis at the origin of the posterior division right MCA. Anterior division of the right middle cerebral artery irregular but patent. Mild stenosis left M1 segment. Moderate stenosis distal left M1. Left middle cerebral artery is patent with scattered atherosclerotic disease. Both anterior cerebral arteries patent Posterior circulation: Mild stenosis distal right  vertebral artery which supplies the basilar. Left vertebral artery ends in PICA. PICA patent bilaterally. Basilar widely patent. Superior cerebellar arteries patent bilaterally. Fetal origin right posterior cerebral artery. Occlusion distal right posterior cerebral artery. Diffuse atherosclerotic disease left posterior cerebral artery. Venous sinuses: Patent Anatomic variants: None Delayed phase: Not perform Review of the MIP images confirms the above findings CT Brain Perfusion Findings: CBF (<30%) Volume: 28mL Perfusion (Tmax>6.0s) volume: 31mL Mismatch Volume: 3mL Infarction Location:Right  MCA territory involving the right temporal lobe and right parietal lobe. CT perfusion volume is smaller than that seen on the recent head CT. This may be due to subacute infarct and pseudo normalization of the CT perfusion. Overall, the CT scan is felt to be more accurate representation of the large territory right MCA infarct. IMPRESSION: Heavily calcified carotid bifurcation bilaterally with mild stenosis bilaterally. Heavily calcified moderate stenosis of the cavernous carotid bilaterally. Moderate stenosis proximal right M1 segment and distal right middle cerebral artery. Right middle cerebral arteries are diseased but patent. Mild left middle cerebral artery stenosis proximally and moderate stenosis distal left middle cerebral artery. Diffuse intracranial atherosclerotic disease with occlusion of the right posterior cerebral artery and diffuse disease in the left posterior cerebral artery. CT perfusion compatible with subacute infarct right MCA territory. CT perfusion under represents the size of the infarct based on CT Head compatible with subacute infarct and pseudo normalization. These results were reviewed in person at the time of interpretation on 07/23/2017 at 2:21 pm to Dr. Delia Heady , who verbally acknowledged these results. Electronically Signed   By: Marlan Palau M.D.   On: 07/23/2017 14:23   Dg Chest Port  1 View  Result Date: 07/23/2017 CLINICAL DATA:  Slurred speech. EXAM: PORTABLE CHEST 1 VIEW COMPARISON:  04/04/2013 FINDINGS: 1616 hours. Low lung volumes with lordotic positioning. The cardio pericardial silhouette is enlarged. No overt pulmonary edema. Interstitial markings are diffusely coarsened with chronic features. No focal lung consolidation or substantial pleural effusion. The visualized bony structures of the thorax are intact. Telemetry leads overlie the chest. IMPRESSION: Cardiomegaly without acute cardiopulmonary findings. Electronically Signed   By: Kennith Center M.D.   On: 07/23/2017 16:32   Ct Head Code Stroke Wo Contrast  Result Date: 07/23/2017 CLINICAL DATA:  Code stroke. 77 y/o M; fall yesterday, unable to walk with new onset today. EXAM: CT HEAD WITHOUT CONTRAST TECHNIQUE: Contiguous axial images were obtained from the base of the skull through the vertex without intravenous contrast. COMPARISON:  05/13/2013 CT head. FINDINGS: Brain: Large right MCA distribution late acute to subacute infarction involving the anterolateral temporal lobe, caudate head, putamen, and insula. Edema and local mass effect with partial effacement of the right lateral ventricle. Minimal right-sided uncal herniation. No significant midline shift. No hemorrhage. No additional area of infarction identified. Stable background of chronic microvascular ischemic changes and parenchymal volume loss of the brain. Vascular: Increased density of right distal M1 and proximal M2 branches may represent thrombus. Calcific atherosclerosis of carotid siphons. Skull: Normal. Negative for fracture or focal lesion. Sinuses/Orbits: No acute finding. Other: Right-sided glaucoma device. ASPECTS [facility] Stroke Program Early CT Score) - Ganglionic level infarction (caudate, lentiform nuclei, internal capsule, insula, M1-M3 cortex): 1 - Supraganglionic infarction (M4-M6 cortex): 3 Total score (0-10 with 10 being normal): 4 IMPRESSION: 1.  Right inferior MCA distribution late acute to subacute infarction involving temporal lobe, insula, and basal ganglia. Mass effect with partial effacement of right lateral ventricle and minimal right uncal herniation. No significant midline shift. No appreciable hemorrhage. 2. Density of right distal M1 and proximal M2 branches may represent thrombus. 3. ASPECTS is 4 These results were communicated to Dr. Pearlean Brownie at 1:46 pmon 2/21/2019by text page via the [facility] messaging system. Electronically Signed   By: Mitzi Hansen M.D.   On: 07/23/2017 13:47     Oralia Manis, DO 07/25/2017, 10:26 AM PGY-1, [facility] FPTS Intern pager: [phone], text pages welcome

## 2017-07-25 NOTE — Evaluation (Signed)
Speech Language Pathology Evaluation Patient Details Name: Noah RumpfJames Wuest Jr. MRN: [id number] DOB: [birth year] Today's Date: 07/25/2017 Time: [id number] SLP Time Calculation (min) (ACUTE ONLY): 19 min  Problem List:  Patient Active Problem List   Diagnosis Date Noted  . 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 Brain herniation (HCC) 07/23/2017  . Cerebrovascular accident (CVA) (HCC) 07/23/2017  . Encounter for screening colonoscopy 03/12/2015   Past Medical History:  Past Medical History:  Diagnosis Date  . Alzheimer's dementia    Noah Bush/notes 07/23/2017  . CKD (chronic kidney disease), stage III (HCC)    Noah Bush/notes 07/23/2017  . Gout   . Hypertension   . Type II diabetes mellitus (HCC)    Noah Bush/notes 07/23/2017   Past Surgical History:  Past Surgical History:  Procedure Laterality Date  . COLONOSCOPY WITH PROPOFOL N/A 05/09/2015   Procedure: COLONOSCOPY WITH PROPOFOL;  Surgeon: Earline MayotteJeffrey W Byrnett, MD;  [facility];  Service: Endoscopy;  Laterality: N/A;  . TIBIA FRACTURE SURGERY Left    HPI:  77 y.o. male presenting as a code stroke with difficulty walking, drooling, and facial droop. PMH is significant for HTN, Alzheimer dementia, CKD III, and T2DM. 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 CXR cardiomegaly without acute cardiopulmonary findings.   Assessment / Plan / Recommendation Clinical Impression   Patient presents with severe cognitive communication impairment; unable to confirm pt's baseline function though I suspect premorbid deficits given Alzheimer's dementia. Pt with left visual neglect; at rest he has right gaze preference and requires max verbal, tactile and visual cues to bring head to midline. Ability to follow basic commands and respond to simple biographical questions is impaired, primarily due to decreased sustained attention vs auditory comprehension. Suspect hearing  loss also a factor. With multiple repetitions, cues for attention, pt is able to follow commands and respond appropriately to questions. Pt's son is not very forthcoming with information about prior deficits, but he does agree pt's left neglect and decreased attention are changes from his baseline. SLP will follow for interventions to improve cognition and communication, in order to reduce caregiver burden and improve quality of life.     SLP Assessment  SLP Recommendation/Assessment: Patient needs continued Speech Lanaguage Pathology Services SLP Visit Diagnosis: Cognitive communication deficit (R41.841)    Follow Up Recommendations  Other (comment)(tbd)    Frequency and Duration min 2x/week  2 weeks      SLP Evaluation Cognition  Overall Cognitive Status: Impaired/Different from baseline Arousal/Alertness: Awake/alert Orientation Level: Oriented to person;Disoriented to time;Disoriented to situation;Oriented to place Attention: Focused;Sustained Focused Attention: Appears intact Sustained Attention: Impaired Sustained Attention Impairment: Verbal basic;Functional basic Memory: Impaired Memory Impairment: Storage deficit;Decreased recall of new information;Decreased short term memory Decreased Short Term Memory: Functional basic Awareness: Impaired Awareness Impairment: Intellectual impairment Problem Solving: Impaired Problem Solving Impairment: Functional basic Safety/Judgment: Impaired       Comprehension  Auditory Comprehension Overall Auditory Comprehension: Impaired Yes/No Questions: Impaired Basic Biographical Questions: 76-100% accurate(75% accurate, 25% no response) Basic Immediate Environment Questions: 50-74% accurate Commands: Impaired One Step Basic Commands: 50-74% accurate Two Step Basic Commands: 0-24% accurate Conversation: Simple Interfering Components: Attention;Hearing;Processing speed EffectiveTechniques: Extra processing  time;Repetition;Visual/Gestural cues;Increased volume Visual Recognition/Discrimination Discrimination: Not tested Reading Comprehension Reading Status: Not tested    Expression Expression Primary Mode of Expression: Verbal Verbal Expression Overall Verbal Expression: Impaired Initiation: No impairment Automatic Speech: Name Level of Generative/Spontaneous Verbalization: Sentence Repetition: Impaired Level of  Impairment: Word level Naming: Not tested Pragmatics: Impairment Impairments: Eye contact;Abnormal affect Interfering Components: Attention Effective Techniques: Open ended questions Non-Verbal Means of Communication: Not applicable Written Expression Dominant Hand: Right Written Expression: Not tested   Oral / Motor  Oral Motor/Sensory Function Overall Oral Motor/Sensory Function: Moderate impairment Facial ROM: Reduced left;Suspected CN VII (facial) dysfunction Facial Symmetry: Abnormal symmetry left;Suspected CN VII (facial) dysfunction Facial Strength: Reduced left;Suspected CN VII (facial) dysfunction Lingual Symmetry: Abnormal symmetry left;Suspected CN XII (hypoglossal) dysfunction Lingual Strength: Reduced;Suspected CN XII (hypoglossal) dysfunction Motor Speech Overall Motor Speech: Appears within functional limits for tasks assessed Respiration: Within functional limits Articulation: Within functional limitis Intelligibility: Intelligible Motor Planning: Witnin functional limits   GO                   Rondel Baton, MS, CCC-SLP Speech-Language Pathologist [phone]   Arlana Lindau 07/25/2017, 2:52 PM

## 2017-07-25 NOTE — Progress Notes (Signed)
  Speech Language Pathology Treatment: Dysphagia  Patient Details Name: Noah RumpfJames Rockett Jr. MRN: [id number] DOB: 1941-02-17 Today's Date: 07/25/2017 Time: [id number] SLP Time Calculation (min) (ACUTE ONLY): 15 min  Assessment / Plan / Recommendation Clinical Impression  Patient seen for follow-up for dysphagia. He is alert throughout session and subsequent cognitive assessment; severe left neglect persists. With ice chips, pt distractible, requiring verbal cue to initiate swallow after his son entered his visual field. Pt has difficulty with labial seal around cup, with mild L anterior loss. With single and multiple straw sips, pt is able to retrieve bolus without spillage. Swallow appears timely and without overt signs of aspiration even with 4 oz swallowed consecutively. With pureed solids, pt is unable to self-feed despite max cues as he is unable to maintain attention long enough to retrieve bolus. SLP provided assistance for feeding and usual cues for attention, swallow initiation. With regular solids, pt masticates but does not initiate swallow; entirety of bolus remains in left buccal cavity and between lips on left side. Pt unable to follow cues for lingual sweep. SLP removed material manually, and with subsequent liquid wash pt exhibited strong cough, suggestive of decreased airway protection. Suspect discoordination with mixed textures or possibly posterior loss of solid. Recommend initiating dys 1, thin liquids, with meds crushed, and full supervision for all PO. Education provided to son re: removing distractions, refraining from conversing with pt when he is eating or drinking. Will follow for tolerance, advancement of solids when appropriate.     HPI HPI: [age] y.o. male presenting as a code stroke with difficulty walking, drooling, and facial droop. PMH is significant for HTN, Alzheimer dementia, CKD III, and T2DM. MRI revealed large acute on subacute R MCA territory infarct with petechial  hemorrhage. CXR cardiomegaly without acute cardiopulmonary findings.      SLP Plan  Continue with current plan of care       Recommendations  Diet recommendations: Thin liquid;Dysphagia 1 (puree) Liquids provided via: Straw Medication Administration: Crushed with puree Supervision: Staff to assist with self feeding;Full supervision/cueing for compensatory strategies Compensations: Minimize environmental distractions;Slow rate;Small sips/bites;Lingual sweep for clearance of pocketing;Other (Comment)(check for L pocketing)                Oral Care Recommendations: Oral care QID Follow up Recommendations: Other (comment)(TBD) SLP Visit Diagnosis: Dysphagia, unspecified (R13.10) Plan: Continue with current plan of care       GO              Rondel BatonMary Beth Nissi Doffing, MS, CCC-SLP Speech-Language Pathologist [phone]  Noah Bush 07/25/2017, 2:19 PM

## 2017-07-25 NOTE — Progress Notes (Signed)
MD made aware of the hypoglycemia episode.

## 2017-07-26 LAB — GLUCOSE, CAPILLARY
GLUCOSE-CAPILLARY: 85 mg/dL (ref 65–99)
Glucose-Capillary: 86 mg/dL (ref 65–99)
Glucose-Capillary: 97 mg/dL (ref 65–99)
Glucose-Capillary: 85 mg/dL (ref 65–99)

## 2017-07-26 LAB — BASIC METABOLIC PANEL
ANION GAP: 9 (ref 5–15)
BUN: 18 mg/dL (ref 6–20)
CHLORIDE: 114 mmol/L — AB (ref 101–111)
CO2: 19 mmol/L — AB (ref 22–32)
CREATININE: 1.37 mg/dL — AB (ref 0.61–1.24)
Calcium: 9.1 mg/dL (ref 8.9–10.3)
GFR calc non Af Amer: 49 mL/min — ABNORMAL LOW (ref >60)
GFR, EST AFRICAN AMERICAN: 56 mL/min — AB (ref >60)
Glucose, Bld: 92 mg/dL (ref 65–99)
POTASSIUM: 5.3 mmol/L — AB (ref 3.5–5.1)
Sodium: 142 mmol/L (ref 135–145)

## 2017-07-26 MED ORDER — HYDROCHLOROTHIAZIDE 12.5 MG PO CAPS
12.5000 mg | ORAL_CAPSULE | Freq: Every day | ORAL | Status: DC
  Administered 2017-07-26: 12.5 mg via ORAL
  Filled 2017-07-26: qty 1

## 2017-07-26 NOTE — Progress Notes (Signed)
Physical Therapy Treatment Patient Details Name: Noah Bush. MRN: [id number] DOB: 11-25-1940 Today's Date: 07/26/2017    History of Present Illness 77 y.o. male presenting as a code stroke with difficulty walking, drooling, and facial droop. PMH is significant for HTN, Alzheimer dementia, CKD III, and T2DM.  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     PT Comments    Pt had to be aroused with wet washcloth to participate.  Pt sat EOB for 15 minutes with +2 assist. Pt pusher to left and severe left neglect.  Pts brother vernon present and encouraged him to sit on pts left side etc to try to get pt to address left side.  Pt has chronic left knee orthopedic limitations as well.   Follow Up Recommendations  CIR     Equipment Recommendations  None recommended by PT    Recommendations for Other Services       Precautions / Restrictions Precautions Precautions: Fall Precaution Comments: severe left neglect Restrictions Weight Bearing Restrictions: No    Mobility  Bed Mobility Overal bed mobility: Needs Assistance Bed Mobility: Supine to Sit;Sit to Supine     Supine to sit: HOB elevated;+2 for physical assistance;Max assist Sit to supine: +2 for physical assistance   General bed mobility comments: needing extensive cues and assist to achieve EOB  Transfers                 General transfer comment: unable - will need lift to get OOB  Ambulation/Gait                 Stairs            Wheelchair Mobility    Modified Rankin (Stroke Patients Only)       Balance Overall balance assessment: Needs assistance                                          Cognition Arousal/Alertness: Lethargic Behavior During Therapy: Flat affect Overall Cognitive Status: Impaired/Different from baseline                                 General Comments: pt initially lethergic.  he woke up  and participated but didnt talk much.  pts brother vernon was present for PT session      Exercises      General Comments General comments (skin integrity, edema, etc.): Pt worked on sitting balance EOB for 15 minutes.  Pt with right gaze preference and right head deviation. he was unable to cross midline with his eyes. pt with max pushing with right UE - took it out of the equation to help work on pts balance and trunk control.   pt had right leg on the floor and left leg not reaching the floor.  After session - assessed left knee and pt said old injury and doesnt bend much (approx 30-40 degrees).  dont know any other details of this.  brother didnt seem to know any details either.  In sitting pt worked on weight shifting - unable to get pt into neutral position.  pts would hold his head up for 5-10 seconds and then let it hang down - when asked to hold head up -it would have twist to right.  we were abel to get  pt propped up and he was abel to maintain his balance for 10-15 second intervals.  Pt did take a drink if his tea in sitting - he used right UE but needed mod to max assist with trunk balance in sitting to take a drink (he didnt want more than this as doesnt like tea)      Pertinent Vitals/Pain Pain Assessment: No/denies pain    Home Living                      Prior Function            PT Goals (current goals can now be found in the care plan section) Progress towards PT goals: Progressing toward goals    Frequency    Min 4X/week      PT Plan Current plan remains appropriate    Co-evaluation              AM-PAC PT "6 Clicks" Daily Activity  Outcome Measure  Difficulty turning over in bed (including adjusting bedclothes, sheets and blankets)?: Unable Difficulty moving from lying on back to sitting on the side of the bed? : Unable Difficulty sitting down on and standing up from a chair with arms (e.g., wheelchair, bedside commode, etc,.)?: Unable Help  needed moving to and from a bed to chair (including a wheelchair)?: Total Help needed walking in hospital room?: Total Help needed climbing 3-5 steps with a railing? : Total 6 Click Score: 6    End of Session   Activity Tolerance: Patient tolerated treatment well Patient left: in bed;with call bell/phone within reach;with bed alarm set Nurse Communication: Mobility status PT Visit Diagnosis: Other abnormalities of gait and mobility (R26.89);Hemiplegia and hemiparesis     Time: 1210-1240 PT Time Calculation (min) (ACUTE ONLY): 30 min  Charges:  $Neuromuscular Re-education: 23-37 mins                    G Codes:      07/26/2017   Ranae PalmsElizabeth Helen Winterhalter, PT    Judson RochHildreth, Karlis Cregg Gardner 07/26/2017, 1:21 PM

## 2017-07-26 NOTE — Progress Notes (Signed)
Family Medicine Teaching Service Daily Progress Note Intern Pager: [phone]  Patient name: Noah Bush. Medical record number: [id number] Date of birth: 05-Sep-1940 Age: 77 y.o. Gender: male  Primary Care Provider: Patient, No Pcp Per Consultants: Neurology, PT/OT/SLP, CIR Code Status: Full   Pt Overview and Major Events to Date:  Admitted to FPTS on 07/23/17  Assessment and Plan: Noah Bushis a 77 y.o.malepresenting as a code strokewithdifficulty walking, drooling, and facial droop. PMH is significant forHTN, Alzheimer dementia, CKD III, and T2DM  Subacute R MCA Infarct: Acute. On exam, this am (2/24) patient was extremely somnolent. Would awaken to physical and verbal stimuli for short periods.  Could not participate in Neuro exam. Neurology consulted, recommend dual antiplatelet therapy and statin, aggressive risk factor modification. Echocardiogram showing mild diffuse hypokinesis with distinct regional wall motion abnormalities. LVEF 40-45%. -neurology following, appreciate recommendations  -plan for DAPT with ASA and clopidigrel x 3 mo -permissive HTN for 24-48 hrs ending 2/24 -SLP, PT, and OT consults placed. Patient has active bed rest order and may get up with assistance.  -NS @50cc /hr  Elevated Troponin: Acute. Resolved as Troponins were down trending and no active chest pain this am (2/24). Stable. Initial istat troponin elevated to 0.27, with trended troponin elevated to 0.16>0.16>0.14. EKG on 07/23/17 in NSR and known LBBB, with am EKG showing similar findings.   Hyperkalemia: Acute. Improving. Still elevated at 5.3 this am (2/24). Improving with IVF. Unclear etiology at this time, possibly related to medications. Urinary retention also a possible etiology given patient has a prescription for flomax 0.4mg  daily (that has not filled since December 2018) and doxazosin 4mg  daily (per [facility]).  Patient denies symptoms and EKG without significant changes.   -Cont IVF @ 2650mL/hr -monitor on daily BMP  Type 2 Diabetes: CBGs range from 109 to 73-110 past 24hrs. A1C on 07/24/17 of 6.9 patient on one home oral med but family unable to recall. [facility] home medications include: glipizide ER 2.5mg  daily - Cont sSSI and CBGs  HTN: Chronic. Ending permissive HTN for 24-48 hrs post stroke, BP of 151/64 this am. SBP range of 122-151 and DBP range of 63-96 this am (2/24). Patient had not taken home meds for 5 days prior to stroke. [facility] home medications include: lisinopril-HCTZ 20/12.5 daily - permissive HTN after 24-48 hours - Can slowly plan to resume home antihypertensives pending swallow study; will restart HCTZ today 12.5mg  daily  CKD III: Chronic. Current Cr 1.51. Per chart review, baseline likely Cr ~1.6-1.8.   - avoid nephrotoxic agents - Monitor SCr   Alzheimer's dementia Per [facility], medications are as follows: memantine daily - Cont memantine daily  FEN/GI:NPO untilformalspeech eval; NS @125cc /hr Prophylaxis:SCDs until MRI confirms no hemorrhagic element to stroke  Disposition: continued inpatient stay, will likely discharge to CIR when medically stable  Subjective:  Patient states he is not in any pain. Very somnolent this morning, difficult to arouse but does awaken for very short periods. Breathing comfortably but not able to answer many questions. Could not answer to where he was or what year it is.  Objective: Temp:  [97.9 F (36.6 C)-99.5 F (37.5 C)] 97.9 F (36.6 C) (02/24 0439) Pulse Rate:  [64-72] 68 (02/24 0439) Resp:  [18-20] 18 (02/24 0439) BP: (122-151)/(63-96) 151/64 (02/24 0439) SpO2:  [94 %-100 %] 100 % (02/24 0439) Physical Exam: Gen: Alert and Oriented x 3, NAD HEENT: Normocephalic, atraumatic, PERRLA, unable to assess  EOMI CV: RRR, no murmurs, normal S1, S2 split Resp: CTAB in frontal lung fields, could not assess lower  posterior lung sounds due to patient non-compliance Abd: non-distended, non-tender, soft, +bs in all four quadrants MSK: FROM in all four extremities Ext: no clubbing, cyanosis, trace edema bilaterally in LE Neuro: unable to assess this am Skin: warm, dry, intact, no rashes  Laboratory: Recent Labs  Lab 07/23/17 1325 07/23/17 1334 07/24/17 0546 07/25/17 0314  WBC 7.6  --  8.2 8.2  HGB 14.2 15.3 13.9 12.5*  HCT 41.2 45.0 40.8 37.8*  PLT 249  --  228 222   Recent Labs  Lab 07/23/17 1325  07/24/17 1416 07/25/17 0314 07/26/17 0537  NA 135   < > 141 139 142  K 5.6*   < > 5.6* 5.2* 5.3*  CL 108   < > 113* 115* 114*  CO2 17*   < > 19* 12* 19*  BUN 39*   < > 29* 27* 18  CREATININE 1.77*   < > 1.62* 1.51* 1.37*  CALCIUM 9.6   < > 9.2 8.8* 9.1  PROT 7.7  --   --   --   --   BILITOT 0.7  --   --   --   --   ALKPHOS 50  --   --   --   --   ALT 15*  --   --   --   --   AST 20  --   --   --   --   GLUCOSE 107*   < > 80 79 92   < > = values in this interval not displayed.    CBG (last 3)  Recent Labs    07/25/17 1712 07/25/17 2139 07/26/17 0643  GLUCAP 110* 109* 85     Imaging/Diagnostic Tests: Ct Angio Head W Or Wo Contrast  Result Date: 07/23/2017 CLINICAL DATA:  Stroke. EXAM: CT ANGIOGRAPHY HEAD AND NECK CT PERFUSION BRAIN TECHNIQUE: Multidetector CT imaging of the head and neck was performed using the standard protocol during bolus administration of intravenous contrast. Multiplanar CT image reconstructions and MIPs were obtained to evaluate the vascular anatomy. Carotid stenosis measurements (when applicable) are obtained utilizing NASCET criteria, using the distal internal carotid diameter as the denominator. Multiphase CT imaging of the brain was performed following IV bolus contrast injection. Subsequent parametric perfusion maps were calculated using RAPID software. CONTRAST:  90mL ISOVUE-370 IOPAMIDOL (ISOVUE-370) INJECTION 76% COMPARISON:  CT head 07/23/2017  FINDINGS: CTA NECK FINDINGS Aortic arch: Atherosclerotic disease in the aortic arch. 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 Ascending aorta measures 37 mm in diameter. Atherosclerotic disease in the proximal great vessels which are patent. Right carotid system: Calcified plaque right carotid bifurcation. Less than 25% diameter stenosis right internal carotid artery at the origin. Left carotid system: Atherosclerotic disease at the proximal left common carotid artery which is mildly narrowed. Atherosclerotic calcification left carotid bifurcation with less than 25% diameter stenosis. Diffusely diseased left common carotid artery Vertebral arteries: Right vertebral artery dominant and patent to the basilar. Calcified plaque at the origin. Small left vertebral artery ends in PICA. Skeleton: Cervical degenerative change. No acute skeletal abnormality. Other neck: Negative for mass or adenopathy. Upper chest: Negative Review of the MIP images confirms the above findings CTA HEAD FINDINGS Anterior circulation: Cavernous carotid is heavily calcified bilaterally with moderate stenosis right greater than left Moderate stenosis proximal right M1 segment. Moderate disease right middle cerebral artery bifurcation with moderate  stenosis at the origin of the posterior division right MCA. Anterior division of the right middle cerebral artery irregular but patent. Mild stenosis left M1 segment. Moderate stenosis distal left M1. Left middle cerebral artery is patent with scattered atherosclerotic disease. Both anterior cerebral arteries patent Posterior circulation: Mild stenosis distal right vertebral artery which supplies the basilar. Left vertebral artery ends in PICA. PICA patent bilaterally. Basilar widely patent. Superior cerebellar arteries patent bilaterally. Fetal origin right posterior cerebral artery. Occlusion distal right posterior cerebral artery. Diffuse atherosclerotic disease left posterior cerebral artery.  Venous sinuses: Patent Anatomic variants: None Delayed phase: Not perform Review of the MIP images confirms the above findings CT Brain Perfusion Findings: CBF (<30%) Volume: 28mL Perfusion (Tmax>6.0s) volume: 31mL Mismatch Volume: 3mL Infarction Location:Right MCA territory involving the right temporal lobe and right parietal lobe. CT perfusion volume is smaller than that seen on the recent head CT. This may be due to subacute infarct and pseudo normalization of the CT perfusion. Overall, the CT scan is felt to be more accurate representation of the large territory right MCA infarct. IMPRESSION: Heavily calcified carotid bifurcation bilaterally with mild stenosis bilaterally. Heavily calcified moderate stenosis of the cavernous carotid bilaterally. Moderate stenosis proximal right M1 segment and distal right middle cerebral artery. Right middle cerebral arteries are diseased but patent. Mild left middle cerebral artery stenosis proximally and moderate stenosis distal left middle cerebral artery. Diffuse intracranial atherosclerotic disease with occlusion of the right posterior cerebral artery and diffuse disease in the left posterior cerebral artery. CT perfusion compatible with subacute infarct right MCA territory. CT perfusion under represents the size of the infarct based on CT Head compatible with subacute infarct and pseudo normalization. These results were reviewed in person at the time of interpretation on 07/23/2017 at 2:21 pm to Dr. Delia Heady , who verbally acknowledged these results. Electronically Signed   By: Marlan Palau M.D.   On: 07/23/2017 14:23   Ct Angio Neck W Or Wo Contrast  Result Date: 07/23/2017 CLINICAL DATA:  Stroke. EXAM: CT ANGIOGRAPHY HEAD AND NECK CT PERFUSION BRAIN TECHNIQUE: Multidetector CT imaging of the head and neck was performed using the standard protocol during bolus administration of intravenous contrast. Multiplanar CT image reconstructions and MIPs were obtained to  evaluate the vascular anatomy. Carotid stenosis measurements (when applicable) are obtained utilizing NASCET criteria, using the distal internal carotid diameter as the denominator. Multiphase CT imaging of the brain was performed following IV bolus contrast injection. Subsequent parametric perfusion maps were calculated using RAPID software. CONTRAST:  90mL ISOVUE-370 IOPAMIDOL (ISOVUE-370) INJECTION 76% COMPARISON:  CT head 07/23/2017 FINDINGS: CTA NECK FINDINGS Aortic arch: Atherosclerotic disease in the aortic arch. 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 Ascending aorta measures 37 mm in diameter. Atherosclerotic disease in the proximal great vessels which are patent. Right carotid system: Calcified plaque right carotid bifurcation. Less than 25% diameter stenosis right internal carotid artery at the origin. Left carotid system: Atherosclerotic disease at the proximal left common carotid artery which is mildly narrowed. Atherosclerotic calcification left carotid bifurcation with less than 25% diameter stenosis. Diffusely diseased left common carotid artery Vertebral arteries: Right vertebral artery dominant and patent to the basilar. Calcified plaque at the origin. Small left vertebral artery ends in PICA. Skeleton: Cervical degenerative change. No acute skeletal abnormality. Other neck: Negative for mass or adenopathy. Upper chest: Negative Review of the MIP images confirms the above findings CTA HEAD FINDINGS Anterior circulation: Cavernous carotid is heavily calcified bilaterally with moderate stenosis right  greater than left Moderate stenosis proximal right M1 segment. Moderate disease right middle cerebral artery bifurcation with moderate stenosis at the origin of the posterior division right MCA. Anterior division of the right middle cerebral artery irregular but patent. Mild stenosis left M1 segment. Moderate stenosis distal left M1. Left middle cerebral artery is patent with scattered  atherosclerotic disease. Both anterior cerebral arteries patent Posterior circulation: Mild stenosis distal right vertebral artery which supplies the basilar. Left vertebral artery ends in PICA. PICA patent bilaterally. Basilar widely patent. Superior cerebellar arteries patent bilaterally. Fetal origin right posterior cerebral artery. Occlusion distal right posterior cerebral artery. Diffuse atherosclerotic disease left posterior cerebral artery. Venous sinuses: Patent Anatomic variants: None Delayed phase: Not perform Review of the MIP images confirms the above findings CT Brain Perfusion Findings: CBF (<30%) Volume: 28mL Perfusion (Tmax>6.0s) volume: 31mL Mismatch Volume: 3mL Infarction Location:Right MCA territory involving the right temporal lobe and right parietal lobe. CT perfusion volume is smaller than that seen on the recent head CT. This may be due to subacute infarct and pseudo normalization of the CT perfusion. Overall, the CT scan is felt to be more accurate representation of the large territory right MCA infarct. IMPRESSION: Heavily calcified carotid bifurcation bilaterally with mild stenosis bilaterally. Heavily calcified moderate stenosis of the cavernous carotid bilaterally. Moderate stenosis proximal right M1 segment and distal right middle cerebral artery. Right middle cerebral arteries are diseased but patent. Mild left middle cerebral artery stenosis proximally and moderate stenosis distal left middle cerebral artery. Diffuse intracranial atherosclerotic disease with occlusion of the right posterior cerebral artery and diffuse disease in the left posterior cerebral artery. CT perfusion compatible with subacute infarct right MCA territory. CT perfusion under represents the size of the infarct based on CT Head compatible with subacute infarct and pseudo normalization. These results were reviewed in person at the time of interpretation on 07/23/2017 at 2:21 pm to Dr. Delia Heady , who verbally  acknowledged these results. Electronically Signed   By: Marlan Palau M.D.   On: 07/23/2017 14:23   Mr Brain Wo Contrast  Result Date: 07/24/2017 CLINICAL DATA:  Follow-up code stroke. Subacute RIGHT MCA infarct. Difficulty walking, facial droop. History of Alzheimer's disease, advanced intracranial atherosclerosis. EXAM: MRI HEAD WITHOUT CONTRAST TECHNIQUE: Multiplanar, multiecho pulse sequences of the brain and surrounding structures were obtained without intravenous contrast. COMPARISON:  CT HEAD July 23, 2017 FINDINGS: INTRACRANIAL CONTENTS: Reduced diffusion and susceptibility artifact RIGHT frontotemporal parietal cortex and, RIGHT basal ganglia with patchy low ADC values RIGHT lenticulostriate nucleus, superior aspect RIGHT frontotemporal parietal cortex. Regional mass effect without midline shift. Mild effacement RIGHT lateral ventricle without LEFT ventricle entrapment. Patchy to confluent supratentorial white matter FLAIR T2 hyperintensities exclusive a aforementioned abnormality. VASCULAR: Normal major intracranial vascular flow voids present at skull base. SKULL AND UPPER CERVICAL SPINE: No abnormal sellar expansion. No suspicious calvarial bone marrow signal. Craniocervical junction maintained. SINUSES/ORBITS: Trace paranasal sinus mucosal thickening. Mastoid air cells are well aerated. Included ocular globes and orbital contents are non-suspicious. OTHER: None. IMPRESSION: 1. Large acute on subacute RIGHT MCA territory infarct with petechial hemorrhage. 2. Regional RIGHT cerebrum mass effect without midline shift. Mildly effaced RIGHT lateral ventricle without LEFT ventricle entrapment. 3. Moderate chronic small vessel ischemic disease. Electronically Signed   By: Awilda Metro M.D.   On: 07/24/2017 05:19   Ct Abdomen Pelvis W Contrast  Result Date: 06/29/2017 CLINICAL DATA:  Lower abdominal pain. EXAM: CT ABDOMEN AND PELVIS WITH CONTRAST TECHNIQUE: Multidetector CT imaging of the  abdomen and pelvis was performed using the standard protocol following bolus administration of intravenous contrast. CONTRAST:  ISOVUE-300 IOPAMIDOL (ISOVUE-300) INJECTION 61% COMPARISON:  01/29/2013 FINDINGS: Lower chest: Linear bibasilar scarring. No effusions. Coronary artery calcifications. Heart is borderline in size. Hepatobiliary: Layering gallstones within the gallbladder. A small hypodensity in the right hepatic lobe is stable since prior study and likely reflects a small cyst. No biliary ductal dilatation. Pancreas: No focal abnormality or ductal dilatation. Spleen: No focal abnormality.  Normal size. Adrenals/Urinary Tract: Several right renal cysts. No hydronephrosis bilaterally. Adrenal glands and urinary bladder unremarkable. Stomach/Bowel: Sigmoid diverticulosis. No active diverticulitis. Appendix is normal. Stomach and small bowel decompressed, unremarkable. Vascular/Lymphatic: Diffuse aortic and iliac calcifications. No aneurysm or adenopathy. Reproductive: Central prostate calcifications. Other: No free fluid or free air. Musculoskeletal: No acute bony abnormality. Chronic appearing compression fractures at T12 and L1. IMPRESSION: Bibasilar scarring. Layering high-density material within the gallbladder, likely small stones. Sigmoid diverticulosis.  No active diverticulitis. Aortoiliac atherosclerosis. No acute findings in the abdomen or pelvis. Electronically Signed   By: Charlett Nose M.D.   On: 06/29/2017 22:26   Ct Cerebral Perfusion W Contrast  Result Date: 07/23/2017 CLINICAL DATA:  Stroke. EXAM: CT ANGIOGRAPHY HEAD AND NECK CT PERFUSION BRAIN TECHNIQUE: Multidetector CT imaging of the head and neck was performed using the standard protocol during bolus administration of intravenous contrast. Multiplanar CT image reconstructions and MIPs were obtained to evaluate the vascular anatomy. Carotid stenosis measurements (when applicable) are obtained utilizing NASCET criteria, using the  distal internal carotid diameter as the denominator. Multiphase CT imaging of the brain was performed following IV bolus contrast injection. Subsequent parametric perfusion maps were calculated using RAPID software. CONTRAST:  90mL ISOVUE-370 IOPAMIDOL (ISOVUE-370) INJECTION 76% COMPARISON:  CT head 07/23/2017 FINDINGS: CTA NECK FINDINGS Aortic arch: Atherosclerotic disease in the aortic arch. 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 Ascending aorta measures 37 mm in diameter. Atherosclerotic disease in the proximal great vessels which are patent. Right carotid system: Calcified plaque right carotid bifurcation. Less than 25% diameter stenosis right internal carotid artery at the origin. Left carotid system: Atherosclerotic disease at the proximal left common carotid artery which is mildly narrowed. Atherosclerotic calcification left carotid bifurcation with less than 25% diameter stenosis. Diffusely diseased left common carotid artery Vertebral arteries: Right vertebral artery dominant and patent to the basilar. Calcified plaque at the origin. Small left vertebral artery ends in PICA. Skeleton: Cervical degenerative change. No acute skeletal abnormality. Other neck: Negative for mass or adenopathy. Upper chest: Negative Review of the MIP images confirms the above findings CTA HEAD FINDINGS Anterior circulation: Cavernous carotid is heavily calcified bilaterally with moderate stenosis right greater than left Moderate stenosis proximal right M1 segment. Moderate disease right middle cerebral artery bifurcation with moderate stenosis at the origin of the posterior division right MCA. Anterior division of the right middle cerebral artery irregular but patent. Mild stenosis left M1 segment. Moderate stenosis distal left M1. Left middle cerebral artery is patent with scattered atherosclerotic disease. Both anterior cerebral arteries patent Posterior circulation: Mild stenosis distal right vertebral artery which  supplies the basilar. Left vertebral artery ends in PICA. PICA patent bilaterally. Basilar widely patent. Superior cerebellar arteries patent bilaterally. Fetal origin right posterior cerebral artery. Occlusion distal right posterior cerebral artery. Diffuse atherosclerotic disease left posterior cerebral artery. Venous sinuses: Patent Anatomic variants: None Delayed phase: Not perform Review of the MIP images confirms the above findings CT Brain Perfusion Findings: CBF (<30%) Volume: 28mL Perfusion (Tmax>6.0s)  volume: 31mL Mismatch Volume: 3mL Infarction Location:Right MCA territory involving the right temporal lobe and right parietal lobe. CT perfusion volume is smaller than that seen on the recent head CT. This may be due to subacute infarct and pseudo normalization of the CT perfusion. Overall, the CT scan is felt to be more accurate representation of the large territory right MCA infarct. IMPRESSION: Heavily calcified carotid bifurcation bilaterally with mild stenosis bilaterally. Heavily calcified moderate stenosis of the cavernous carotid bilaterally. Moderate stenosis proximal right M1 segment and distal right middle cerebral artery. Right middle cerebral arteries are diseased but patent. Mild left middle cerebral artery stenosis proximally and moderate stenosis distal left middle cerebral artery. Diffuse intracranial atherosclerotic disease with occlusion of the right posterior cerebral artery and diffuse disease in the left posterior cerebral artery. CT perfusion compatible with subacute infarct right MCA territory. CT perfusion under represents the size of the infarct based on CT Head compatible with subacute infarct and pseudo normalization. These results were reviewed in person at the time of interpretation on 07/23/2017 at 2:21 pm to Dr. Delia Heady , who verbally acknowledged these results. Electronically Signed   By: Marlan Palau M.D.   On: 07/23/2017 14:23   Dg Chest Port 1 View  Result Date:  07/23/2017 CLINICAL DATA:  Slurred speech. EXAM: PORTABLE CHEST 1 VIEW COMPARISON:  04/04/2013 FINDINGS: 1616 hours. Low lung volumes with lordotic positioning. The cardio pericardial silhouette is enlarged. No overt pulmonary edema. Interstitial markings are diffusely coarsened with chronic features. No focal lung consolidation or substantial pleural effusion. The visualized bony structures of the thorax are intact. Telemetry leads overlie the chest. IMPRESSION: Cardiomegaly without acute cardiopulmonary findings. Electronically Signed   By: Kennith Center M.D.   On: 07/23/2017 16:32   Ct Head Code Stroke Wo Contrast  Result Date: 07/23/2017 CLINICAL DATA:  Code stroke. 77 y/o M; fall yesterday, unable to walk with new onset today. EXAM: CT HEAD WITHOUT CONTRAST TECHNIQUE: Contiguous axial images were obtained from the base of the skull through the vertex without intravenous contrast. COMPARISON:  05/13/2013 CT head. FINDINGS: Brain: Large right MCA distribution late acute to subacute infarction involving the anterolateral temporal lobe, caudate head, putamen, and insula. Edema and local mass effect with partial effacement of the right lateral ventricle. Minimal right-sided uncal herniation. No significant midline shift. No hemorrhage. No additional area of infarction identified. Stable background of chronic microvascular ischemic changes and parenchymal volume loss of the brain. Vascular: Increased density of right distal M1 and proximal M2 branches may represent thrombus. Calcific atherosclerosis of carotid siphons. Skull: Normal. Negative for fracture or focal lesion. Sinuses/Orbits: No acute finding. Other: Right-sided glaucoma device. ASPECTS [facility] Stroke Program Early CT Score) - Ganglionic level infarction (caudate, lentiform nuclei, internal capsule, insula, M1-M3 cortex): 1 - Supraganglionic infarction (M4-M6 cortex): 3 Total score (0-10 with 10 being normal): 4 IMPRESSION: 1. Right inferior MCA  distribution late acute to subacute infarction involving temporal lobe, insula, and basal ganglia. Mass effect with partial effacement of right lateral ventricle and minimal right uncal herniation. No significant midline shift. No appreciable hemorrhage. 2. Density of right distal M1 and proximal M2 branches may represent thrombus. 3. ASPECTS is 4 These results were communicated to Dr. Pearlean Brownie at 1:46 pmon 2/21/2019by text page via the [facility] messaging system. Electronically Signed   By: Mitzi Hansen M.D.   On: 07/23/2017 13:47     Arlyce Harman, DO 07/26/2017, 8:13 AM PGY-1, [facility] Family Medicine FPTS Intern pager: [phone], text  pages welcome

## 2017-07-27 ENCOUNTER — Encounter (HOSPITAL_COMMUNITY): Payer: Self-pay | Admitting: Family Medicine

## 2017-07-27 DIAGNOSIS — I639 Cerebral infarction, unspecified: Secondary | ICD-10-CM

## 2017-07-27 DIAGNOSIS — I63311 Cerebral infarction due to thrombosis of right middle cerebral artery: Secondary | ICD-10-CM

## 2017-07-27 HISTORY — DX: Cerebral infarction, unspecified: I63.9

## 2017-07-27 LAB — GLUCOSE, CAPILLARY
GLUCOSE-CAPILLARY: 81 mg/dL (ref 65–99)
Glucose-Capillary: 125 mg/dL — ABNORMAL HIGH (ref 65–99)
Glucose-Capillary: 104 mg/dL — ABNORMAL HIGH (ref 65–99)
Glucose-Capillary: 115 mg/dL — ABNORMAL HIGH (ref 65–99)
Glucose-Capillary: 106 mg/dL — ABNORMAL HIGH (ref 65–99)

## 2017-07-27 LAB — BASIC METABOLIC PANEL
Anion gap: 8 (ref 5–15)
BUN: 18 mg/dL (ref 6–20)
CHLORIDE: 115 mmol/L — AB (ref 101–111)
CO2: 19 mmol/L — AB (ref 22–32)
Calcium: 9.1 mg/dL (ref 8.9–10.3)
Creatinine, Ser: 1.36 mg/dL — ABNORMAL HIGH (ref 0.61–1.24)
GFR calc Af Amer: 57 mL/min — ABNORMAL LOW (ref >60)
GFR, EST NON AFRICAN AMERICAN: 49 mL/min — AB (ref >60)
GLUCOSE: 114 mg/dL — AB (ref 65–99)
POTASSIUM: 4.7 mmol/L (ref 3.5–5.1)
Sodium: 142 mmol/L (ref 135–145)

## 2017-07-27 MED ORDER — MEMANTINE HCL 5 MG PO TABS
5.0000 mg | ORAL_TABLET | Freq: Every day | ORAL | Status: DC
  Administered 2017-07-27 – 2017-07-28 (×2): 5 mg via ORAL
  Filled 2017-07-27 (×2): qty 1

## 2017-07-27 MED ORDER — METOPROLOL SUCCINATE ER 25 MG PO TB24
12.5000 mg | ORAL_TABLET | Freq: Every day | ORAL | Status: DC
  Administered 2017-07-27 – 2017-07-28 (×2): 12.5 mg via ORAL
  Filled 2017-07-27 (×3): qty 1

## 2017-07-27 NOTE — Progress Notes (Addendum)
Family Medicine Teaching Service Daily Progress Note Intern Pager: [phone]  Patient name: Noah RumpfJames Viramontes Jr. Medical record number: [id number] Date of birth: March 02, 1941 Age: 77 y.o. Gender: male  Primary Care Provider: Patient, No Pcp Per Consultants: Neurology, PT/OT/SLP, CIR Code Status: Full   Pt Overview and Major Events to Date:  Admitted to FPTS on 07/23/17  Assessment and Plan: Noah RilingJames Barbee Bushis a 76 y.o.malepresenting as a code strokewithdifficulty walking, drooling, and facial droop. PMH is significant forHTN, Alzheimer dementia, CKD III, and T2DM  Subacute R MCA Infarct On exam, this am (2/24) patient was able to answer questions but refused to do commands. Would awaken to physical and verbal stimuli for short periods. Could not participate in Neuro exam. Neurology consulted, recommend dual antiplatelet therapy and statin, aggressive risk factor modification. Echocardiogram showing mild diffuse hypokinesis with distinct regional wall motion abnormalities. LVEF 40-45%. -neurology following, appreciate recommendations -plan for DAPT with ASA and clopidigrel x 3 mo -will re-start BP meds  -SLP, PT, and OT consults placed. Patient has active bed rest order and may get up with assistance.  Elevated Troponin - resolved  Troponins were down trending (0.16>0.16>0.14) and no active chest pain this am. EKGon 2/21/19in NSR and known LBBB, with am EKG showing similar findings.   Hyperkalemia - resolved K of 4.7 on 2/25. Improving with IVF. Unclear etiology at this time, possibly related to medications. Urinary retention also a possible etiology given patient has a prescription for flomax 0.4mg  daily(thathas not filled since December 2018) anddoxazosin 4mg  daily ([facility]).Patient denies symptoms and EKG without significant changes.  -monitor on daily BMP  Type 2 Diabetes CBGs range from 85-125 past 24hrs. A1C on 07/24/17 of 6.9patient on one home oral med but  family unable to recall. [facility] home medications include: glipizide ER 2.5mg  daily - Cont sSSI and CBGs  HTN Chronic. BP of 118/48 this am. Patient had not taken home meds for 5 days prior to stroke. 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 Current Cr1.36.Per chart review, baseline likely Cr ~1.6-1.8.  - avoid nephrotoxic agents - Monitor SCr   Alzheimer's dementia Per [facility], medications are as follows: memantine daily - Cont memantine daily  FEN/GI:dysphagia 1 Prophylaxis:SCDs until MRI confirms no hemorrhagic element to stroke  Disposition: hopeful for CIR placement   Subjective:  Patient today states he is doing well. Still refusing to participate in physical exam.   Objective: Temp:  [97.7 F (36.5 C)-99.4 F (37.4 C)] 97.7 F (36.5 C) (02/25 0824) Pulse Rate:  [66-76] 71 (02/25 0824) Resp:  [17-20] 18 (02/25 0824) BP: (115-137)/(47-66) 115/47 (02/25 0824) SpO2:  [98 %-100 %] 100 % (02/25 0824) Physical Exam: General: will awaken to verbal stimuli but refuses to open eyes, oriented to person but not to place or time  Cardiovascular: RRR, no MRG  Respiratory: CTAB, no wheezes, rales, or rhonchi  Abdomen: soft, non tender, non distended, bowel sounds normal  Extremities: no edema, normal grip strength in right hand  Neuro: would not move extremities or follow commands, equal sensation bilaterally, orientedx1 (person)  Laboratory: Recent Labs  Lab 07/23/17 1325 07/23/17 1334 07/24/17 0546 07/25/17 0314  WBC 7.6  --  8.2 8.2  HGB 14.2 15.3 13.9 12.5*  HCT 41.2 45.0 40.8  37.8*  PLT 249  --  228 222   Recent Labs  Lab 07/23/17 1325  07/25/17 0314 07/26/17 0537 07/27/17 0352   NA 135   < > 139 142 142  K 5.6*   < > 5.2* 5.3* 4.7  CL 108   < > 115* 114* 115*  CO2 17*   < > 12* 19* 19*  BUN 39*   < > 27* 18 18  CREATININE 1.77*   < > 1.51* 1.37* 1.36*  CALCIUM 9.6   < > 8.8* 9.1 9.1  PROT 7.7  --   --   --   --   BILITOT 0.7  --   --   --   --   ALKPHOS 50  --   --   --   --   ALT 15*  --   --   --   --   AST 20  --   --   --   --   GLUCOSE 107*   < > 79 92 114*   < > = values in this interval not displayed.    Imaging/Diagnostic Tests: Ct Angio Head W Or Wo Contrast  Result Date: 07/23/2017 CLINICAL DATA:  Stroke. EXAM: CT ANGIOGRAPHY HEAD AND NECK CT PERFUSION BRAIN TECHNIQUE: Multidetector CT imaging of the head and neck was performed using the standard protocol during bolus administration of intravenous contrast. Multiplanar CT image reconstructions and MIPs were obtained to evaluate the vascular anatomy. Carotid stenosis measurements (when applicable) are obtained utilizing NASCET criteria, using the distal internal carotid diameter as the denominator. Multiphase CT imaging of the brain was performed following IV bolus contrast injection. Subsequent parametric perfusion maps were calculated using RAPID software. CONTRAST:  90mL ISOVUE-370 IOPAMIDOL (ISOVUE-370) INJECTION 76% COMPARISON:  CT head 07/23/2017 FINDINGS: CTA NECK FINDINGS Aortic arch: Atherosclerotic disease in the aortic arch. 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 Ascending aorta measures 37 mm in diameter. Atherosclerotic disease in the proximal great vessels which are patent. Right carotid system: Calcified plaque right carotid bifurcation. Less than 25% diameter stenosis right internal carotid artery at the origin. Left carotid system: Atherosclerotic disease at the proximal left common carotid artery which is mildly narrowed. Atherosclerotic calcification left carotid bifurcation with less than 25% diameter stenosis. Diffusely diseased left common carotid artery Vertebral arteries: Right  vertebral artery dominant and patent to the basilar. Calcified plaque at the origin. Small left vertebral artery ends in PICA. Skeleton: Cervical degenerative change. No acute skeletal abnormality. Other neck: Negative for mass or adenopathy. Upper chest: Negative Review of the MIP images confirms the above findings CTA HEAD FINDINGS Anterior circulation: Cavernous carotid is heavily calcified bilaterally with moderate stenosis right greater than left Moderate stenosis proximal right M1 segment. Moderate disease right middle cerebral artery bifurcation with moderate stenosis at the origin of the posterior division right MCA. Anterior division of the right middle cerebral artery irregular but patent. Mild stenosis left M1 segment. Moderate stenosis distal left M1. Left middle cerebral artery is patent with scattered atherosclerotic disease. Both anterior cerebral arteries patent Posterior circulation: Mild stenosis distal right vertebral artery which supplies the basilar. Left vertebral artery ends in PICA. PICA patent bilaterally. Basilar widely patent. Superior cerebellar arteries patent bilaterally. Fetal origin right posterior cerebral artery. Occlusion distal right posterior cerebral artery. Diffuse atherosclerotic disease left posterior cerebral artery. Venous sinuses: Patent Anatomic variants: None Delayed phase: Not perform Review of the MIP images confirms the above findings CT Brain Perfusion Findings: CBF (<30%)  Volume: 28mL Perfusion (Tmax>6.0s) volume: 31mL Mismatch Volume: 3mL Infarction Location:Right MCA territory involving the right temporal lobe and right parietal lobe. CT perfusion volume is smaller than that seen on the recent head CT. This may be due to subacute infarct and pseudo normalization of the CT perfusion. Overall, the CT scan is felt to be more accurate representation of the large territory right MCA infarct. IMPRESSION: Heavily calcified carotid bifurcation bilaterally with mild  stenosis bilaterally. Heavily calcified moderate stenosis of the cavernous carotid bilaterally. Moderate stenosis proximal right M1 segment and distal right middle cerebral artery. Right middle cerebral arteries are diseased but patent. Mild left middle cerebral artery stenosis proximally and moderate stenosis distal left middle cerebral artery. Diffuse intracranial atherosclerotic disease with occlusion of the right posterior cerebral artery and diffuse disease in the left posterior cerebral artery. CT perfusion compatible with subacute infarct right MCA territory. CT perfusion under represents the size of the infarct based on CT Head compatible with subacute infarct and pseudo normalization. These results were reviewed in person at the time of interpretation on 07/23/2017 at 2:21 pm to Dr. Delia Heady , who verbally acknowledged these results. Electronically Signed   By: Marlan Palau M.D.   On: 07/23/2017 14:23   Ct Angio Neck W Or Wo Contrast  Result Date: 07/23/2017 CLINICAL DATA:  Stroke. EXAM: CT ANGIOGRAPHY HEAD AND NECK CT PERFUSION BRAIN TECHNIQUE: Multidetector CT imaging of the head and neck was performed using the standard protocol during bolus administration of intravenous contrast. Multiplanar CT image reconstructions and MIPs were obtained to evaluate the vascular anatomy. Carotid stenosis measurements (when applicable) are obtained utilizing NASCET criteria, using the distal internal carotid diameter as the denominator. Multiphase CT imaging of the brain was performed following IV bolus contrast injection. Subsequent parametric perfusion maps were calculated using RAPID software. CONTRAST:  90mL ISOVUE-370 IOPAMIDOL (ISOVUE-370) INJECTION 76% COMPARISON:  CT head 07/23/2017 FINDINGS: CTA NECK FINDINGS Aortic arch: Atherosclerotic disease in the aortic arch. 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 Ascending aorta measures 37 mm in diameter. Atherosclerotic disease in the proximal great  vessels which are patent. Right carotid system: Calcified plaque right carotid bifurcation. Less than 25% diameter stenosis right internal carotid artery at the origin. Left carotid system: Atherosclerotic disease at the proximal left common carotid artery which is mildly narrowed. Atherosclerotic calcification left carotid bifurcation with less than 25% diameter stenosis. Diffusely diseased left common carotid artery Vertebral arteries: Right vertebral artery dominant and patent to the basilar. Calcified plaque at the origin. Small left vertebral artery ends in PICA. Skeleton: Cervical degenerative change. No acute skeletal abnormality. Other neck: Negative for mass or adenopathy. Upper chest: Negative Review of the MIP images confirms the above findings CTA HEAD FINDINGS Anterior circulation: Cavernous carotid is heavily calcified bilaterally with moderate stenosis right greater than left Moderate stenosis proximal right M1 segment. Moderate disease right middle cerebral artery bifurcation with moderate stenosis at the origin of the posterior division right MCA. Anterior division of the right middle cerebral artery irregular but patent. Mild stenosis left M1 segment. Moderate stenosis distal left M1. Left middle cerebral artery is patent with scattered atherosclerotic disease. Both anterior cerebral arteries patent Posterior circulation: Mild stenosis distal right vertebral artery which supplies the basilar. Left vertebral artery ends in PICA. PICA patent bilaterally. Basilar widely patent. Superior cerebellar arteries patent bilaterally. Fetal origin right posterior cerebral artery. Occlusion distal right posterior cerebral artery. Diffuse atherosclerotic disease left posterior cerebral artery. Venous sinuses: Patent Anatomic variants: None Delayed  phase: Not perform Review of the MIP images confirms the above findings CT Brain Perfusion Findings: CBF (<30%) Volume: 28mL Perfusion (Tmax>6.0s) volume: 31mL  Mismatch Volume: 3mL Infarction Location:Right MCA territory involving the right temporal lobe and right parietal lobe. CT perfusion volume is smaller than that seen on the recent head CT. This may be due to subacute infarct and pseudo normalization of the CT perfusion. Overall, the CT scan is felt to be more accurate representation of the large territory right MCA infarct. IMPRESSION: Heavily calcified carotid bifurcation bilaterally with mild stenosis bilaterally. Heavily calcified moderate stenosis of the cavernous carotid bilaterally. Moderate stenosis proximal right M1 segment and distal right middle cerebral artery. Right middle cerebral arteries are diseased but patent. Mild left middle cerebral artery stenosis proximally and moderate stenosis distal left middle cerebral artery. Diffuse intracranial atherosclerotic disease with occlusion of the right posterior cerebral artery and diffuse disease in the left posterior cerebral artery. CT perfusion compatible with subacute infarct right MCA territory. CT perfusion under represents the size of the infarct based on CT Head compatible with subacute infarct and pseudo normalization. These results were reviewed in person at the time of interpretation on 07/23/2017 at 2:21 pm to Dr. Delia Heady , who verbally acknowledged these results. Electronically Signed   By: Marlan Palau M.D.   On: 07/23/2017 14:23   Mr Brain Wo Contrast  Result Date: 07/24/2017 CLINICAL DATA:  Follow-up code stroke. Subacute RIGHT MCA infarct. Difficulty walking, facial droop. History of Alzheimer's disease, advanced intracranial atherosclerosis. EXAM: MRI HEAD WITHOUT CONTRAST TECHNIQUE: Multiplanar, multiecho pulse sequences of the brain and surrounding structures were obtained without intravenous contrast. COMPARISON:  CT HEAD July 23, 2017 FINDINGS: INTRACRANIAL CONTENTS: Reduced diffusion and susceptibility artifact RIGHT frontotemporal parietal cortex and, RIGHT basal ganglia  with patchy low ADC values RIGHT lenticulostriate nucleus, superior aspect RIGHT frontotemporal parietal cortex. Regional mass effect without midline shift. Mild effacement RIGHT lateral ventricle without LEFT ventricle entrapment. Patchy to confluent supratentorial white matter FLAIR T2 hyperintensities exclusive a aforementioned abnormality. VASCULAR: Normal major intracranial vascular flow voids present at skull base. SKULL AND UPPER CERVICAL SPINE: No abnormal sellar expansion. No suspicious calvarial bone marrow signal. Craniocervical junction maintained. SINUSES/ORBITS: Trace paranasal sinus mucosal thickening. Mastoid air cells are well aerated. Included ocular globes and orbital contents are non-suspicious. OTHER: None. IMPRESSION: 1. Large acute on subacute RIGHT MCA territory infarct with petechial hemorrhage. 2. Regional RIGHT cerebrum mass effect without midline shift. Mildly effaced RIGHT lateral ventricle without LEFT ventricle entrapment. 3. Moderate chronic small vessel ischemic disease. Electronically Signed   By: Awilda Metro M.D.   On: 07/24/2017 05:19   Ct Abdomen Pelvis W Contrast  Result Date: 06/29/2017 CLINICAL DATA:  Lower abdominal pain. EXAM: CT ABDOMEN AND PELVIS WITH CONTRAST TECHNIQUE: Multidetector CT imaging of the abdomen and pelvis was performed using the standard protocol following bolus administration of intravenous contrast. CONTRAST:  ISOVUE-300 IOPAMIDOL (ISOVUE-300) INJECTION 61% COMPARISON:  01/29/2013 FINDINGS: Lower chest: Linear bibasilar scarring. No effusions. Coronary artery calcifications. Heart is borderline in size. Hepatobiliary: Layering gallstones within the gallbladder. A small hypodensity in the right hepatic lobe is stable since prior study and likely reflects a small cyst. No biliary ductal dilatation. Pancreas: No focal abnormality or ductal dilatation. Spleen: No focal abnormality.  Normal size. Adrenals/Urinary Tract: Several right renal  cysts. No hydronephrosis bilaterally. Adrenal glands and urinary bladder unremarkable. Stomach/Bowel: Sigmoid diverticulosis. No active diverticulitis. Appendix is normal. Stomach and small bowel decompressed, unremarkable. Vascular/Lymphatic: Diffuse aortic  and iliac calcifications. No aneurysm or adenopathy. Reproductive: Central prostate calcifications. Other: No free fluid or free air. Musculoskeletal: No acute bony abnormality. Chronic appearing compression fractures at T12 and L1. IMPRESSION: Bibasilar scarring. Layering high-density material within the gallbladder, likely small stones. Sigmoid diverticulosis.  No active diverticulitis. Aortoiliac atherosclerosis. No acute findings in the abdomen or pelvis. Electronically Signed   By: Charlett Nose M.D.   On: 06/29/2017 22:26   Ct Cerebral Perfusion W Contrast  Result Date: 07/23/2017 CLINICAL DATA:  Stroke. EXAM: CT ANGIOGRAPHY HEAD AND NECK CT PERFUSION BRAIN TECHNIQUE: Multidetector CT imaging of the head and neck was performed using the standard protocol during bolus administration of intravenous contrast. Multiplanar CT image reconstructions and MIPs were obtained to evaluate the vascular anatomy. Carotid stenosis measurements (when applicable) are obtained utilizing NASCET criteria, using the distal internal carotid diameter as the denominator. Multiphase CT imaging of the brain was performed following IV bolus contrast injection. Subsequent parametric perfusion maps were calculated using RAPID software. CONTRAST:  90mL ISOVUE-370 IOPAMIDOL (ISOVUE-370) INJECTION 76% COMPARISON:  CT head 07/23/2017 FINDINGS: CTA NECK FINDINGS Aortic arch: Atherosclerotic disease in the aortic arch. 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 Ascending aorta measures 37 mm in diameter. Atherosclerotic disease in the proximal great vessels which are patent. Right carotid system: Calcified plaque right carotid bifurcation. Less than 25% diameter stenosis right  internal carotid artery at the origin. Left carotid system: Atherosclerotic disease at the proximal left common carotid artery which is mildly narrowed. Atherosclerotic calcification left carotid bifurcation with less than 25% diameter stenosis. Diffusely diseased left common carotid artery Vertebral arteries: Right vertebral artery dominant and patent to the basilar. Calcified plaque at the origin. Small left vertebral artery ends in PICA. Skeleton: Cervical degenerative change. No acute skeletal abnormality. Other neck: Negative for mass or adenopathy. Upper chest: Negative Review of the MIP images confirms the above findings CTA HEAD FINDINGS Anterior circulation: Cavernous carotid is heavily calcified bilaterally with moderate stenosis right greater than left Moderate stenosis proximal right M1 segment. Moderate disease right middle cerebral artery bifurcation with moderate stenosis at the origin of the posterior division right MCA. Anterior division of the right middle cerebral artery irregular but patent. Mild stenosis left M1 segment. Moderate stenosis distal left M1. Left middle cerebral artery is patent with scattered atherosclerotic disease. Both anterior cerebral arteries patent Posterior circulation: Mild stenosis distal right vertebral artery which supplies the basilar. Left vertebral artery ends in PICA. PICA patent bilaterally. Basilar widely patent. Superior cerebellar arteries patent bilaterally. Fetal origin right posterior cerebral artery. Occlusion distal right posterior cerebral artery. Diffuse atherosclerotic disease left posterior cerebral artery. Venous sinuses: Patent Anatomic variants: None Delayed phase: Not perform Review of the MIP images confirms the above findings CT Brain Perfusion Findings: CBF (<30%) Volume: 28mL Perfusion (Tmax>6.0s) volume: 31mL Mismatch Volume: 3mL Infarction Location:Right MCA territory involving the right temporal lobe and right parietal lobe. CT perfusion  volume is smaller than that seen on the recent head CT. This may be due to subacute infarct and pseudo normalization of the CT perfusion. Overall, the CT scan is felt to be more accurate representation of the large territory right MCA infarct. IMPRESSION: Heavily calcified carotid bifurcation bilaterally with mild stenosis bilaterally. Heavily calcified moderate stenosis of the cavernous carotid bilaterally. Moderate stenosis proximal right M1 segment and distal right middle cerebral artery. Right middle cerebral arteries are diseased but patent. Mild left middle cerebral artery stenosis proximally and moderate stenosis distal left middle cerebral artery. Diffuse  intracranial atherosclerotic disease with occlusion of the right posterior cerebral artery and diffuse disease in the left posterior cerebral artery. CT perfusion compatible with subacute infarct right MCA territory. CT perfusion under represents the size of the infarct based on CT Head compatible with subacute infarct and pseudo normalization. These results were reviewed in person at the time of interpretation on 07/23/2017 at 2:21 pm to Dr. Delia Heady , who verbally acknowledged these results. Electronically Signed   By: Marlan Palau M.D.   On: 07/23/2017 14:23   Dg Chest Port 1 View  Result Date: 07/23/2017 CLINICAL DATA:  Slurred speech. EXAM: PORTABLE CHEST 1 VIEW COMPARISON:  04/04/2013 FINDINGS: 1616 hours. Low lung volumes with lordotic positioning. The cardio pericardial silhouette is enlarged. No overt pulmonary edema. Interstitial markings are diffusely coarsened with chronic features. No focal lung consolidation or substantial pleural effusion. The visualized bony structures of the thorax are intact. Telemetry leads overlie the chest. IMPRESSION: Cardiomegaly without acute cardiopulmonary findings. Electronically Signed   By: Kennith Center M.D.   On: 07/23/2017 16:32   Ct Head Code Stroke Wo Contrast  Result Date: 07/23/2017 CLINICAL  DATA:  Code stroke. 77 y/o M; fall yesterday, unable to walk with new onset today. EXAM: CT HEAD WITHOUT CONTRAST TECHNIQUE: Contiguous axial images were obtained from the base of the skull through the vertex without intravenous contrast. COMPARISON:  05/13/2013 CT head. FINDINGS: Brain: Large right MCA distribution late acute to subacute infarction involving the anterolateral temporal lobe, caudate head, putamen, and insula. Edema and local mass effect with partial effacement of the right lateral ventricle. Minimal right-sided uncal herniation. No significant midline shift. No hemorrhage. No additional area of infarction identified. Stable background of chronic microvascular ischemic changes and parenchymal volume loss of the brain. Vascular: Increased density of right distal M1 and proximal M2 branches may represent thrombus. Calcific atherosclerosis of carotid siphons. Skull: Normal. Negative for fracture or focal lesion. Sinuses/Orbits: No acute finding. Other: Right-sided glaucoma device. ASPECTS [facility] Stroke Program Early CT Score) - Ganglionic level infarction (caudate, lentiform nuclei, internal capsule, insula, M1-M3 cortex): 1 - Supraganglionic infarction (M4-M6 cortex): 3 Total score (0-10 with 10 being normal): 4 IMPRESSION: 1. Right inferior MCA distribution late acute to subacute infarction involving temporal lobe, insula, and basal ganglia. Mass effect with partial effacement of right lateral ventricle and minimal right uncal herniation. No significant midline shift. No appreciable hemorrhage. 2. Density of right distal M1 and proximal M2 branches may represent thrombus. 3. ASPECTS is 4 These results were communicated to Dr. Pearlean Brownie at 1:46 pmon 2/21/2019by text page via the [facility] messaging system. Electronically Signed   By: Mitzi Hansen M.D.   On: 07/23/2017 13:47     Oralia Manis, DO 07/27/2017, 9:20 AM PGY-1, [facility] Family Medicine FPTS Intern pager: [phone], text pages  welcome

## 2017-07-27 NOTE — Progress Notes (Signed)
Inpatient Rehabilitation  Met with patient at bedside to discuss team's recommendation for IP Rehab.  Shared booklets, insurance verification letter, and answered initial  questions.  I also called and spoke with spouse and son, who will be primary caregiver.  They are hopeful for a good recovery and we also talked about being prepared for wheelchair level upon discharge from [facility] given severity of left sided deficits.  Plan to initiate insurance authorization once I have updated OT note.  Call if questions.    Carmelia Roller., CCC/SLP Admission Coordinator  [place]  Cell [phone]

## 2017-07-27 NOTE — Progress Notes (Signed)
  Speech Language Pathology Treatment: Dysphagia  Patient Details Name: Noah RumpfJames Luedke Jr. MRN: [id number] DOB: [birth year] Today's Date: 07/27/2017 Time: [id number] SLP Time Calculation (min) (ACUTE ONLY): 15 min  Assessment / Plan / Recommendation Clinical Impression  SLP followed up for dysphagia and cognitive linguistic intervention. Pt lethargic though able to arouse with cueing, right gaze preference persists despite maximal cues. Oral care provided prior to PO trials. Pt continues to exhibit significant left oral motor weakness post CVA with left sided pocketing of upgraded trial of solid PO. Cues given for pt to implement lingual sweep though pt unable to initiate. No overt s/sx of aspiration with any PO, though recommend continue dysphagia 1 (puree) and thin liquid diet with medicines crushed in puree. ST to continue to follow up.      HPI HPI: 77 y.o. male presenting as a code stroke with difficulty walking, drooling, and facial droop. PMH is significant for HTN, Alzheimer dementia, CKD III, and T2DM. 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 CXR cardiomegaly without acute cardiopulmonary findings.      SLP Plan  Continue with current plan of care       Recommendations  Diet recommendations: Dysphagia 1 (puree);Thin liquid Liquids provided via: Cup;Straw Medication Administration: Crushed with puree Supervision: Staff to assist with self feeding;Full supervision/cueing for compensatory strategies Compensations: Minimize environmental distractions;Slow rate;Small sips/bites;Lingual sweep for clearance of pocketing;Other (Comment)                Oral Care Recommendations: Oral care BID SLP Visit Diagnosis: Dysphagia, oropharyngeal phase (R13.12) Plan: Continue with current plan of care       GO               Jeani Sowhelsea Faustine Tates MA, CCC-SLP Acute Care Speech Language Pathologist    Areli Jowett E Nija Koopman 07/27/2017, 2:42  PM

## 2017-07-27 NOTE — Progress Notes (Signed)
Occupational Therapy Treatment Patient Details Name: Noah RumpfJames Boniface Jr. MRN: [id number] DOB: [birth year] Today's Date: 07/27/2017    History of present illness 77 y.o. male presenting as a code stroke with difficulty walking, drooling, and facial droop. PMH is significant for HTN, Alzheimer dementia, CKD III, and T2DM.  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    OT comments  Pt demonstrating decreased arousal during session. Required Max cues to sustain attention to task. Pt performing grooming task at bed level with Max A and cues. Pt continues to present with significant L sided neglect (to body and environment). Continue to recommend post-acute rehab and will continue to follow acutely as admitted.    Follow Up Recommendations  CIR    Equipment Recommendations  3 in 1 bedside commode    Recommendations for Other Services      Precautions / Restrictions Precautions Precautions: Fall Precaution Comments: severe left neglect Restrictions Weight Bearing Restrictions: No       Mobility Bed Mobility Overal bed mobility: Needs Assistance Bed Mobility: Rolling Rolling: Total assist         General bed mobility comments: Total A for rolling to optimize positioning  Transfers                 General transfer comment: unable - will need lift to get OOB    Balance                                           ADL either performed or assessed with clinical judgement   ADL Overall ADL's : Needs assistance/impaired     Grooming: Oral care;Bed level;Wash/dry face;Maximal assistance Grooming Details (indicate cue type and reason): Pt requiring Max cues to participate in grooming tasks at bed level. Pt stating "dont touch my face!" Pt unable to maintain arousal long enough to continue to participate                               General ADL Comments: Pt requiring Max A for grooming at bed level.  Max A for bed mobility. Facilitate optimal position to increase arousal and elevate BUEs for edema.      Vision   Vision Assessment?: Yes Eye Alignment: Impaired (comment) Ocular Range of Motion: Restricted on the left Alignment/Gaze Preference: Gaze right Tracking/Visual Pursuits: Right eye does not track medially;Left eye does not track laterally Visual Fields: Left visual field deficit   Perception     Praxis      Cognition Arousal/Alertness: Lethargic Behavior During Therapy: Flat affect Overall Cognitive Status: Impaired/Different from baseline Area of Impairment: Attention;Orientation;Problem solving;Memory;Safety/judgement;Following commands;Awareness                 Orientation Level: Disoriented to;Situation Current Attention Level: Sustained Memory: Decreased short-term memory Following Commands: Follows one step commands inconsistently Safety/Judgement: Decreased awareness of deficits;Decreased awareness of safety Awareness: Intellectual Problem Solving: Slow processing;Decreased initiation;Difficulty sequencing;Requires verbal cues;Requires tactile cues General Comments: Pt flucuating between lethargic state. Would open eyes to communicate with OT and follow simple commands and then fall back asleep. Pt requiring increased time and cues throughout session        Exercises     Shoulder Instructions       General Comments lethargic throughout session    Pertinent Vitals/ Pain  Pain Assessment: Faces Faces Pain Scale: No hurt Pain Intervention(s): Monitored during session  Home Living                                          Prior Functioning/Environment              Frequency  Min 3X/week        Progress Toward Goals  OT Goals(current goals can now be found in the care plan section)  Progress towards OT goals: Not progressing toward goals - comment(Lethargic)  Acute Rehab OT Goals Patient Stated Goal: not  stated OT Goal Formulation: Patient unable to participate in goal setting Time For Goal Achievement: 08/07/17 Potential to Achieve Goals: Good ADL Goals Pt Will Perform Eating: with min assist;sitting;bed level(when cleared for POs) Pt Will Perform Grooming: with min assist;sitting(3 activities) Additional ADL Goal #1: Pt will turn his head 45 degrees to the L to locate visual targets 50% of time with multimodal cues. Additional ADL Goal #2: Pt will sit at EOB with min assist in preparation for ADL. Additional ADL Goal #3: Pt will locate L UE with R UE during mobility and ADL to position and protect from injur.y  Plan Discharge plan remains appropriate    Co-evaluation                 AM-PAC PT "6 Clicks" Daily Activity     Outcome Measure   Help from another person eating meals?: Total Help from another person taking care of personal grooming?: A Lot Help from another person toileting, which includes using toliet, bedpan, or urinal?: Total Help from another person bathing (including washing, rinsing, drying)?: A Lot Help from another person to put on and taking off regular upper body clothing?: A Lot Help from another person to put on and taking off regular lower body clothing?: Total 6 Click Score: 9    End of Session    OT Visit Diagnosis: Muscle weakness (generalized) (M62.81);Hemiplegia and hemiparesis;Other symptoms and signs involving cognitive function Hemiplegia - Right/Left: Left Hemiplegia - dominant/non-dominant: Non-Dominant Hemiplegia - caused by: Cerebral infarction   Activity Tolerance Patient tolerated treatment well   Patient Left in bed;with call bell/phone within reach;with bed alarm set   Nurse Communication Mobility status;Need for lift equipment        Time: [phone] OT Time Calculation (min): 17 min  Charges: OT General Charges $OT Visit: 1 Visit OT Treatments $Self Care/Home Management : 8-22 mins  Meir Elwood MSOT, OTR/L Acute  Rehab Pager: [phone] Office: [phone]   Theodoro Grist Javaya [place] 07/27/2017, 4:44 PM

## 2017-07-27 NOTE — Care Management Important Message (Signed)
Important Message  Patient Details  Name: Noah RumpfJames Popoca Jr. MRN: [id number] Date of Birth: 07-10-1940   Medicare Important Message Given:  Yes    Avani Sensabaugh 07/27/2017, 1:31 PM

## 2017-07-28 ENCOUNTER — Encounter (HOSPITAL_COMMUNITY): Payer: Self-pay | Admitting: Family Medicine

## 2017-07-28 ENCOUNTER — Other Ambulatory Visit: Payer: Self-pay | Admitting: Cardiology

## 2017-07-28 ENCOUNTER — Inpatient Hospital Stay (HOSPITAL_COMMUNITY)
Admission: RE | Admit: 2017-07-28 | Discharge: 2017-08-25 | DRG: 057 | Disposition: A | Discharge status: Skilled Nursing Facility | Payer: Medicare Other | Source: Intra-hospital | Attending: Physical Medicine & Rehabilitation | Admitting: Physical Medicine & Rehabilitation

## 2017-07-28 ENCOUNTER — Encounter (HOSPITAL_COMMUNITY): Payer: Self-pay | Admitting: *Deleted

## 2017-07-28 DIAGNOSIS — R414 Neurologic neglect syndrome: Secondary | ICD-10-CM | POA: Diagnosis present

## 2017-07-28 DIAGNOSIS — N179 Acute kidney failure, unspecified: Secondary | ICD-10-CM | POA: Diagnosis present

## 2017-07-28 DIAGNOSIS — F0150 Vascular dementia without behavioral disturbance: Secondary | ICD-10-CM | POA: Diagnosis present

## 2017-07-28 DIAGNOSIS — I69392 Facial weakness following cerebral infarction: Secondary | ICD-10-CM | POA: Diagnosis present

## 2017-07-28 DIAGNOSIS — I63511 Cerebral infarction due to unspecified occlusion or stenosis of right middle cerebral artery: Secondary | ICD-10-CM

## 2017-07-28 DIAGNOSIS — E669 Obesity, unspecified: Secondary | ICD-10-CM

## 2017-07-28 DIAGNOSIS — R110 Nausea: Secondary | ICD-10-CM | POA: Diagnosis present

## 2017-07-28 DIAGNOSIS — M109 Gout, unspecified: Secondary | ICD-10-CM | POA: Diagnosis present

## 2017-07-28 DIAGNOSIS — I10 Essential (primary) hypertension: Secondary | ICD-10-CM | POA: Diagnosis present

## 2017-07-28 DIAGNOSIS — R1310 Dysphagia, unspecified: Secondary | ICD-10-CM | POA: Diagnosis present

## 2017-07-28 DIAGNOSIS — Z7982 Long term (current) use of aspirin: Secondary | ICD-10-CM

## 2017-07-28 DIAGNOSIS — M79644 Pain in right finger(s): Secondary | ICD-10-CM | POA: Diagnosis not present

## 2017-07-28 DIAGNOSIS — E785 Hyperlipidemia, unspecified: Secondary | ICD-10-CM

## 2017-07-28 DIAGNOSIS — M25641 Stiffness of right hand, not elsewhere classified: Secondary | ICD-10-CM | POA: Diagnosis present

## 2017-07-28 DIAGNOSIS — I69391 Dysphagia following cerebral infarction: Secondary | ICD-10-CM

## 2017-07-28 DIAGNOSIS — R627 Adult failure to thrive: Secondary | ICD-10-CM | POA: Diagnosis present

## 2017-07-28 DIAGNOSIS — M25662 Stiffness of left knee, not elsewhere classified: Secondary | ICD-10-CM | POA: Diagnosis present

## 2017-07-28 DIAGNOSIS — E639 Nutritional deficiency, unspecified: Secondary | ICD-10-CM | POA: Diagnosis not present

## 2017-07-28 DIAGNOSIS — E1122 Type 2 diabetes mellitus with diabetic chronic kidney disease: Secondary | ICD-10-CM | POA: Diagnosis present

## 2017-07-28 DIAGNOSIS — D62 Acute posthemorrhagic anemia: Secondary | ICD-10-CM | POA: Diagnosis present

## 2017-07-28 DIAGNOSIS — G309 Alzheimer's disease, unspecified: Secondary | ICD-10-CM | POA: Diagnosis present

## 2017-07-28 DIAGNOSIS — M25642 Stiffness of left hand, not elsewhere classified: Secondary | ICD-10-CM | POA: Diagnosis present

## 2017-07-28 DIAGNOSIS — N183 Chronic kidney disease, stage 3 unspecified: Secondary | ICD-10-CM | POA: Diagnosis present

## 2017-07-28 DIAGNOSIS — Z6838 Body mass index (BMI) 38.0-38.9, adult: Secondary | ICD-10-CM | POA: Diagnosis not present

## 2017-07-28 DIAGNOSIS — F0280 Dementia in other diseases classified elsewhere without behavioral disturbance: Secondary | ICD-10-CM | POA: Diagnosis present

## 2017-07-28 DIAGNOSIS — M79641 Pain in right hand: Secondary | ICD-10-CM | POA: Diagnosis present

## 2017-07-28 DIAGNOSIS — E440 Moderate protein-calorie malnutrition: Secondary | ICD-10-CM | POA: Diagnosis not present

## 2017-07-28 DIAGNOSIS — K5900 Constipation, unspecified: Secondary | ICD-10-CM | POA: Diagnosis present

## 2017-07-28 DIAGNOSIS — I639 Cerebral infarction, unspecified: Secondary | ICD-10-CM

## 2017-07-28 DIAGNOSIS — R5383 Other fatigue: Secondary | ICD-10-CM | POA: Diagnosis not present

## 2017-07-28 DIAGNOSIS — E1169 Type 2 diabetes mellitus with other specified complication: Secondary | ICD-10-CM

## 2017-07-28 DIAGNOSIS — M79674 Pain in right toe(s): Secondary | ICD-10-CM

## 2017-07-28 DIAGNOSIS — Z79899 Other long term (current) drug therapy: Secondary | ICD-10-CM | POA: Diagnosis not present

## 2017-07-28 DIAGNOSIS — G936 Cerebral edema: Secondary | ICD-10-CM

## 2017-07-28 DIAGNOSIS — I6300 Cerebral infarction due to thrombosis of unspecified precerebral artery: Secondary | ICD-10-CM

## 2017-07-28 DIAGNOSIS — I63311 Cerebral infarction due to thrombosis of right middle cerebral artery: Secondary | ICD-10-CM | POA: Diagnosis not present

## 2017-07-28 DIAGNOSIS — I129 Hypertensive chronic kidney disease with stage 1 through stage 4 chronic kidney disease, or unspecified chronic kidney disease: Secondary | ICD-10-CM | POA: Diagnosis present

## 2017-07-28 DIAGNOSIS — R7989 Other specified abnormal findings of blood chemistry: Secondary | ICD-10-CM | POA: Diagnosis not present

## 2017-07-28 LAB — GLUCOSE, CAPILLARY
GLUCOSE-CAPILLARY: 90 mg/dL (ref 65–99)
GLUCOSE-CAPILLARY: 104 mg/dL — AB (ref 65–99)
GLUCOSE-CAPILLARY: 122 mg/dL — AB (ref 65–99)
Glucose-Capillary: 82 mg/dL (ref 65–99)

## 2017-07-28 LAB — BASIC METABOLIC PANEL
ANION GAP: 9 (ref 5–15)
BUN: 19 mg/dL (ref 6–20)
CO2: 19 mmol/L — ABNORMAL LOW (ref 22–32)
Calcium: 9 mg/dL (ref 8.9–10.3)
Chloride: 115 mmol/L — ABNORMAL HIGH (ref 101–111)
Creatinine, Ser: 1.39 mg/dL — ABNORMAL HIGH (ref 0.61–1.24)
GFR calc non Af Amer: 48 mL/min — ABNORMAL LOW (ref >60)
GFR, EST AFRICAN AMERICAN: 55 mL/min — AB (ref >60)
Glucose, Bld: 117 mg/dL — ABNORMAL HIGH (ref 65–99)
POTASSIUM: 5.1 mmol/L (ref 3.5–5.1)
SODIUM: 143 mmol/L (ref 135–145)

## 2017-07-28 MED ORDER — METOPROLOL SUCCINATE ER 25 MG PO TB24
12.5000 mg | ORAL_TABLET | Freq: Every day | ORAL | Status: DC
  Administered 2017-07-29 – 2017-08-04 (×7): 12.5 mg via ORAL
  Filled 2017-07-28 (×7): qty 1

## 2017-07-28 MED ORDER — ENOXAPARIN SODIUM 40 MG/0.4ML ~~LOC~~ SOLN: 40.0000 mg | SUBCUTANEOUS | Status: DC

## 2017-07-28 MED ORDER — ALUM & MAG HYDROXIDE-SIMETH 200-200-20 MG/5ML PO SUSP: 30.0000 mL | ORAL | Status: DC | PRN

## 2017-07-28 MED ORDER — METOPROLOL SUCCINATE ER 25 MG PO TB24: 12.5000 mg | ORAL_TABLET | Freq: Every day | ORAL | 0 refills | Status: AC

## 2017-07-28 MED ORDER — ASPIRIN EC 81 MG PO TBEC
81.0000 mg | DELAYED_RELEASE_TABLET | Freq: Every day | ORAL | Status: DC
  Administered 2017-07-29 – 2017-08-25 (×27): 81 mg via ORAL
  Filled 2017-07-28 (×27): qty 1

## 2017-07-28 MED ORDER — INSULIN ASPART 100 UNIT/ML ~~LOC~~ SOLN
0.0000 [IU] | Freq: Three times a day (TID) | SUBCUTANEOUS | Status: DC
  Administered 2017-07-31 (×2): 1 [IU] via SUBCUTANEOUS
  Administered 2017-08-01: 2 [IU] via SUBCUTANEOUS
  Administered 2017-08-02 (×2): 1 [IU] via SUBCUTANEOUS
  Administered 2017-08-03 – 2017-08-04 (×3): 2 [IU] via SUBCUTANEOUS
  Administered 2017-08-04 – 2017-08-05 (×2): 1 [IU] via SUBCUTANEOUS
  Administered 2017-08-05: 2 [IU] via SUBCUTANEOUS
  Administered 2017-08-06 – 2017-08-11 (×4): 1 [IU] via SUBCUTANEOUS
  Administered 2017-08-12: 2 [IU] via SUBCUTANEOUS
  Administered 2017-08-14: 1 [IU] via SUBCUTANEOUS
  Administered 2017-08-14: 2 [IU] via SUBCUTANEOUS
  Administered 2017-08-15 (×2): 1 [IU] via SUBCUTANEOUS
  Administered 2017-08-16: 2 [IU] via SUBCUTANEOUS
  Administered 2017-08-16: 1 [IU] via SUBCUTANEOUS
  Administered 2017-08-16: 2 [IU] via SUBCUTANEOUS
  Administered 2017-08-17 (×2): 1 [IU] via SUBCUTANEOUS
  Administered 2017-08-18: 2 [IU] via SUBCUTANEOUS
  Administered 2017-08-18: 1 [IU] via SUBCUTANEOUS
  Administered 2017-08-19: 2 [IU] via SUBCUTANEOUS
  Administered 2017-08-19 – 2017-08-22 (×4): 1 [IU] via SUBCUTANEOUS
  Administered 2017-08-23: 2 [IU] via SUBCUTANEOUS
  Administered 2017-08-24 (×2): 1 [IU] via SUBCUTANEOUS

## 2017-07-28 MED ORDER — FLEET ENEMA 7-19 GM/118ML RE ENEM: 1.0000 | ENEMA | Freq: Once | RECTAL | Status: DC | PRN

## 2017-07-28 MED ORDER — CLOPIDOGREL BISULFATE 75 MG PO TABS: 75.0000 mg | ORAL_TABLET | Freq: Every day | ORAL | 0 refills | Status: DC

## 2017-07-28 MED ORDER — PROCHLORPERAZINE 25 MG RE SUPP: 12.5000 mg | Freq: Four times a day (QID) | RECTAL | Status: DC | PRN

## 2017-07-28 MED ORDER — MEMANTINE HCL 5 MG PO TABS
5.0000 mg | ORAL_TABLET | Freq: Every day | ORAL | Status: DC
  Administered 2017-07-29 – 2017-08-25 (×28): 5 mg via ORAL
  Filled 2017-07-28 (×28): qty 1

## 2017-07-28 MED ORDER — POLYETHYLENE GLYCOL 3350 17 G PO PACK
17.0000 g | PACK | Freq: Every day | ORAL | Status: DC | PRN
  Administered 2017-07-28 – 2017-07-29 (×2): 17 g via ORAL
  Filled 2017-07-28 (×2): qty 1

## 2017-07-28 MED ORDER — ASPIRIN EC 81 MG PO TBEC: 81.0000 mg | DELAYED_RELEASE_TABLET | Freq: Every day | ORAL | Status: DC

## 2017-07-28 MED ORDER — CLOPIDOGREL BISULFATE 75 MG PO TABS
75.0000 mg | ORAL_TABLET | Freq: Every day | ORAL | Status: DC
  Administered 2017-07-29 – 2017-08-25 (×27): 75 mg via ORAL
  Filled 2017-07-28 (×27): qty 1

## 2017-07-28 MED ORDER — GUAIFENESIN-DM 100-10 MG/5ML PO SYRP: 5.0000 mL | ORAL_SOLUTION | Freq: Four times a day (QID) | ORAL | Status: DC | PRN

## 2017-07-28 MED ORDER — TRAZODONE HCL 50 MG PO TABS: 25.0000 mg | ORAL_TABLET | Freq: Every evening | ORAL | Status: DC | PRN

## 2017-07-28 MED ORDER — ENOXAPARIN SODIUM 40 MG/0.4ML ~~LOC~~ SOLN
40.0000 mg | SUBCUTANEOUS | Status: DC
  Administered 2017-07-28 – 2017-08-11 (×15): 40 mg via SUBCUTANEOUS
  Filled 2017-07-28 (×15): qty 0.4

## 2017-07-28 MED ORDER — ATORVASTATIN CALCIUM 80 MG PO TABS
80.0000 mg | ORAL_TABLET | Freq: Every day | ORAL | Status: DC
  Administered 2017-07-29 – 2017-08-24 (×26): 80 mg via ORAL
  Filled 2017-07-28 (×28): qty 1

## 2017-07-28 MED ORDER — PROCHLORPERAZINE EDISYLATE 5 MG/ML IJ SOLN: 5.0000 mg | Freq: Four times a day (QID) | INTRAMUSCULAR | Status: DC | PRN

## 2017-07-28 MED ORDER — ALBUTEROL SULFATE (2.5 MG/3ML) 0.083% IN NEBU: 2.5000 mg | INHALATION_SOLUTION | Freq: Four times a day (QID) | RESPIRATORY_TRACT | Status: DC | PRN

## 2017-07-28 MED ORDER — BISACODYL 10 MG RE SUPP
10.0000 mg | Freq: Every day | RECTAL | Status: DC | PRN
  Administered 2017-07-30: 10 mg via RECTAL
  Filled 2017-07-28: qty 1

## 2017-07-28 MED ORDER — PREMIER PROTEIN SHAKE
11.0000 [oz_av] | Freq: Three times a day (TID) | ORAL | Status: DC
  Administered 2017-07-28: 11 [oz_av] via ORAL
  Filled 2017-07-28 (×3): qty 325.31

## 2017-07-28 MED ORDER — LATANOPROST 0.005 % OP SOLN
1.0000 [drp] | Freq: Every day | OPHTHALMIC | Status: DC
  Administered 2017-07-28 – 2017-08-23 (×27): 1 [drp] via OPHTHALMIC
  Filled 2017-07-28 (×2): qty 2.5

## 2017-07-28 MED ORDER — PREMIER PROTEIN SHAKE
11.0000 [oz_av] | Freq: Three times a day (TID) | ORAL | Status: DC
  Filled 2017-07-28 (×5): qty 325.31

## 2017-07-28 MED ORDER — DIPHENHYDRAMINE HCL 12.5 MG/5ML PO ELIX: 12.5000 mg | ORAL_SOLUTION | Freq: Four times a day (QID) | ORAL | Status: DC | PRN

## 2017-07-28 MED ORDER — ATORVASTATIN CALCIUM 80 MG PO TABS: 80.0000 mg | ORAL_TABLET | Freq: Every day | ORAL | 0 refills | Status: DC

## 2017-07-28 MED ORDER — PROCHLORPERAZINE MALEATE 5 MG PO TABS
5.0000 mg | ORAL_TABLET | Freq: Four times a day (QID) | ORAL | Status: DC | PRN
  Administered 2017-07-30: 5 mg via ORAL
  Filled 2017-07-28: qty 1

## 2017-07-28 MED ORDER — ACETAMINOPHEN 325 MG PO TABS
325.0000 mg | ORAL_TABLET | ORAL | Status: DC | PRN
  Administered 2017-08-05 – 2017-08-23 (×3): 650 mg via ORAL
  Filled 2017-07-28 (×3): qty 2

## 2017-07-28 NOTE — Assessment & Plan Note (Signed)
Brain MRI 07/23/17: RIGHT cerebrum mass effect without midline shift. Mildly effaced RIGHT lateral ventricle without LEFT ventricle entrapment

## 2017-07-28 NOTE — PMR Pre-admission (Addendum)
PMR Admission Coordinator Pre-Admission Assessment  Patient: Noah Bush. is an 77 y.o., male MRN: [id number] DOB: December 05, 1940 Height: 5\' 11"  (180.3 cm) Weight: 124.3 kg (274 lb)              Insurance Information HMO: X    PPO:      PCP:      IPA:      80/20:      OTHER:  PRIMARY: U[employer] Medicare       Policy#: [id number]      Subscriber: Self CM Name: Noreene Larsson      Phone#: [phone] or [phone]     Fax#: [phone] Pre-Cert#: [id number]      Employer: Retired Benefits:  Phone #: [phone]     Name: Verified online at [email or web address] Eff. Date: 06/02/17     Deduct: $0      Out of Pocket Max: [phone]      Life Max: N/A CIR: $430 a day, days 1-4; $0 a day, days 5+      SNF: $0 a day, days 1-20; $160 a day, days 21-62; $0 a day, days 63-100 Outpatient: Necessity      Co-Pay: $40 per visit  Home Health: Necessity, 100%       Co-Pay: $0 DME: 80%     Co-Pay: 20% Providers: In-network   SECONDARY: None        Medicaid Application Date:       Case Manager:  Disability Application Date:       Case Worker:   Emergency Contact Information Contact Information    Name Relation Home Work [place] [phone]     Tietje,[name] Son   [phone]     Current Medical History  Patient Admitting Diagnosis: Large right MCA infarct 07/23/17  History of Present Illness: Sakari Alkhatib Jr.is a 77 y.o.RH- male with history of HTN, Alzheimer dementia, CKD III, and T2DM who was admitted on 07/23/17 from MD office with reports of fall, difficulty walking, drooling, right gaze preference and facial droop. CT/P head revealed  R-MCA territory infarct involving right temporal and parietal lobe with heavily calcified carotid bifurcations and moderate stenosis right M1 and distal R- MCA with luxury perfusion and no mismatch.  He was not felt to be candidate for thrombolysis due to large infarct and Dr. Pearlean Brownie recommended DAPT X 3 months.   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  2 D echo showed EF 40-45% with mild diffuse hypokinesis-- possible sever hypokinesis basal mid inferior and inferoseptal myocardium with grade one diastolic dysfunction. May need outpatient TEE/Loop recorder per neuro.  Cognitive evaluation revealed severe cognitive communication impairment question baseline but able to follow basic commands with left visual neglect. He continues to have issues with lethargy requiring tactile cues for activity, has significant left oral motor weakness--started on dysphagia 1 textures, thin liquids and severe left neglect with pusher tendency.  Therapy working on pre-gait activity and CIR recommended due to functional deficits.   NIH Total: 20    Past Medical History  Past Medical History:  Diagnosis Date  . Alzheimer's dementia    [name] 07/23/2017  . Cerebral infarction (HCC) 07/27/2017  . CKD (chronic kidney disease), stage III (HCC)    [name] 07/23/2017  . Gout   . Hypertension   . Type II diabetes mellitus (HCC)    [name] 07/23/2017  Family History  family history includes Diabetes in his paternal grandfather.  Prior Rehab/Hospitalizations:  Has the patient had major surgery during 100 days prior to admission? No  Current Medications   Current Facility-Administered Medications:  .  acetaminophen (TYLENOL) tablet 650 mg, 650 mg, Oral, Q4H PRN **OR** acetaminophen (TYLENOL) solution 650 mg, 650 mg, Per Tube, Q4H PRN **OR** acetaminophen (TYLENOL) suppository 650 mg, 650 mg, Rectal, Q4H PRN, Lockamy, Timothy, DO .  albuterol (PROVENTIL) (2.5 MG/3ML) 0.083% nebulizer solution 2.5 mg, 2.5 mg, Nebulization, Q6H PRN, Darin EngelsAbraham, Sherin, DO .  aspirin EC tablet 81 mg, 81 mg, Oral, Daily, Mayo, Allyn KennerKaty Dodd, MD .  atorvastatin (LIPITOR) tablet 80 mg, 80 mg, Oral, q1800, Costello, Mary A, NP, 80 mg at 07/27/17 1740 .  clopidogrel  (PLAVIX) tablet 75 mg, 75 mg, Oral, Daily, Costello, Mary A, NP, 75 mg at 07/28/17 1037 .  insulin aspart (novoLOG) injection 0-9 Units, 0-9 Units, Subcutaneous, TID WC, Lockamy, Timothy, DO, 1 Units at 07/28/17 1212 .  memantine (NAMENDA) tablet 5 mg, 5 mg, Oral, Daily, Artist PaisYoo, Elsia J, DO, 5 mg at 07/28/17 1037 .  metoprolol succinate (TOPROL-XL) 24 hr tablet 12.5 mg, 12.5 mg, Oral, Daily, Darin Engelsbraham, Sherin, DO, 12.5 mg at 07/28/17 1037  Patients Current Diet: Fall precautions DIET - DYS 1 Room service appropriate? Yes; Fluid consistency: Thin Diet - low sodium heart healthy  Precautions / Restrictions Precautions Precautions: Fall Precaution Comments: severe left neglect Restrictions Weight Bearing Restrictions: No   Has the patient had 2 or more falls or a fall with injury in the past year?No  Prior Activity Level Community (5-7x/wk): Prior to admission patient was fully independent.  Wife reports he went out daily, drove to the car wash and would visit with people.    Home Assistive Devices / Equipment    Prior Device Use: Indicate devices/aids used by the patient prior to current illness, exacerbation or injury? shower chair   Prior Functional Level Prior Function Level of Independence: Needs assistance Gait / Transfers Assistance Needed: ambulatory without a device ADL's / Homemaking Assistance Needed: reports his son helps him shower in standing, wife does houskeeping Comments: pt is a poor historian, no family bedside  Self Care: Did the patient need help bathing, dressing, using the toilet or eating? Independent  Indoor Mobility: Did the patient need assistance with walking from room to room (with or without device)? Independent  Stairs: Did the patient need assistance with internal or external stairs (with or without device)? Independent  Functional Cognition: Did the patient need help planning regular tasks such as shopping or remembering to take medications?  Independent  Current Functional Level Cognition  Arousal/Alertness: Awake/alert Overall Cognitive Status: Impaired/Different from baseline Current Attention Level: Focused Orientation Level: Oriented to person, Disoriented to time, Disoriented to situation, Oriented to place Following Commands: Follows one step commands inconsistently Safety/Judgement: Decreased awareness of deficits, Decreased awareness of safety General Comments: Pt not wanting to open eyes for much of session despite sternal rub, cold wash cloth, verbal stimulation. Follows less than 20% of 1 step commands with increased time and repetition. Decreased attention. "I don't want to get better." Right gaze preference. Not able to get to midline.  Attention: Focused, Sustained Focused Attention: Appears intact Sustained Attention: Impaired Sustained Attention Impairment: Verbal basic, Functional basic Memory: Impaired Memory Impairment: Storage deficit, Decreased recall of new information, Decreased short term memory Decreased Short Term Memory: Functional basic Awareness: Impaired Awareness Impairment: Intellectual impairment Problem Solving: Impaired Problem Solving Impairment: Functional  basic Safety/Judgment: Impaired    Extremity Assessment (includes Sensation/Coordination)  Upper Extremity Assessment: LUE deficits/detail LUE Deficits / Details: 3-/5 shoulder and elbow, no wrist or hand movement noted, no pain response, movement only elicited with functional activity, not upon command LUE Sensation: decreased light touch, decreased proprioception(neglect) LUE Coordination: decreased fine motor, decreased gross motor  Lower Extremity Assessment: Defer to PT evaluation LLE Deficits / Details: no active movement noted. No response to light touch/stimuli.     ADLs  Overall ADL's : Needs assistance/impaired Eating/Feeding: NPO, Bed level Eating/Feeding Details (indicate cue type and reason): brought unloaded spoon  to mouth with R hand Grooming: Oral care, Bed level, Wash/dry face, Maximal assistance Grooming Details (indicate cue type and reason): Pt requiring Max cues to participate in grooming tasks at bed level. Pt stating "dont touch my face!" Pt unable to maintain arousal long enough to continue to participate Upper Body Bathing: Maximal assistance, Sitting Lower Body Bathing: Total assistance, Sitting/lateral leans Upper Body Dressing : Maximal assistance, Sitting Lower Body Dressing: Total assistance, Bed level Toileting- Clothing Manipulation and Hygiene: Total assistance, Bed level General ADL Comments: Pt requiring Max A for grooming at bed level. Max A for bed mobility. Facilitate optimal position to increase arousal and elevate BUEs for edema.     Mobility  Overal bed mobility: Needs Assistance Bed Mobility: Rolling Rolling: Total assist, +2 for physical assistance Supine to sit: HOB elevated, +2 for physical assistance, Max assist Sit to supine: +2 for physical assistance General bed mobility comments: Total A for rolling to optimize positioning. Declined any other movement despite maximal effort from PT/tech. Attempted using family as motivation as well but no effort.     Transfers  General transfer comment: Declined any mobility.     Ambulation / Gait / Stairs / Wheelchair Mobility  Ambulation/Gait General Gait Details: unable    Posture / Balance Dynamic Sitting Balance Sitting balance - Comments: max assist  Balance Overall balance assessment: No apparent balance deficits (not formally assessed)(as pt declining any effort or initiation of movement) Sitting-balance support: Single extremity supported, Feet supported Sitting balance-Leahy Scale: Poor Sitting balance - Comments: max assist  Postural control: Left lateral lean    Special needs/care consideration BiPAP/CPAP: No CPM: No Continuous Drip IV: No Dialysis: No         Life Vest: No Oxygen: No Special Bed:  No Trach Size: No Wound Vac (area): No       Skin: Dry flaky skin                              Bowel mgmt: Incontinent, last BM 07/25/17 Bladder mgmt: Incontinent  Diabetic mgmt: Yes, and he managed with oral meds      Previous Home Environment Living Arrangements: Spouse/significant other, Children  Lives With: Spouse, Son Available Help at Discharge: Family Type of Home: House Home Care Services: No  Discharge Living Setting Plans for Discharge Living Setting: Patient's home, Lives with (comment)(spouse) Type of Home at Discharge: House Discharge Home Layout: Multi-level Alternate Level Stairs-Rails: None Alternate Level Stairs-Number of Steps: 3 steps down from kitchen into the rest of the house  Discharge Home Access: Ramped entrance(into kitchen ) Discharge Bathroom Shower/Tub: Tub/shower unit Discharge Bathroom Toilet: Handicapped height Discharge Bathroom Accessibility: Yes How Accessible: Accessible via walker Does the patient have any problems obtaining your medications?: No  Social/Family/Support Systems Patient Roles: Spouse, Parent, Other (Comment)(Grandparent ) Contact Information: Spouse: Dub Mikes  [phone] Anticipated Caregiver: SonZyiere Rosemond [phone] Anticipated Caregiver's Contact Information: see above Ability/Limitations of Caregiver: Wife unbale to physically assist patient  Caregiver Availability: 24/7 Discharge Plan Discussed with Primary Caregiver: Yes Is Caregiver In Agreement with Plan?: Yes Does Caregiver/Family have Issues with Lodging/Transportation while Pt is in Rehab?: No  Goals/Additional Needs Patient/Family Goal for Rehab: PT/OT/SLP: Min-Mod A Expected length of stay: 20-30 days  Cultural Considerations: None Dietary Needs: Dys.1 textures and thin liquids  Equipment Needs: TBD Special Service Needs: Discussed likely wheelchair level and need to ramp inside of house too Additional Information: Dsicussed with son and  spouse  Pt/Family Agrees to Admission and willing to participate: Yes Program Orientation Provided & Reviewed with Pt/Caregiver Including Roles  & Responsibilities: Yes Additional Information Needs: see above  Information Needs to be Provided By: Team FYI  Barriers to Discharge: Home environment access/layout  Decrease burden of Care through IP rehab admission: No  Possible need for SNF placement upon discharge: Potentially  Patient Condition: This patient's medical and functional status has changed since the consult dated: 07/24/17 in which the Rehabilitation Physician determined and documented that the patient's condition is appropriate for intensive rehabilitative care in an inpatient rehabilitation facility. See "History of Present Illness" (above) for medical update. Functional changes are: Max A +2 bed mobility. Patient's medical and functional status update has been discussed with the Rehabilitation physician and patient remains appropriate for inpatient rehabilitation. Will admit to inpatient rehab today.  Preadmission Screen Completed By:  Fae Pippin, 07/28/2017 3:35 PM ______________________________________________________________________   Discussed status with Dr. Allena Katz on 07/28/17 at 1600 and received telephone approval for admission today.  Admission Coordinator:  Fae Pippin, time 1600/Date 07/28/17

## 2017-07-28 NOTE — Progress Notes (Addendum)
Physical Therapy Treatment Patient Details Name: Noah RumpfJames Siguenza Jr. MRN: [id number] DOB: [birth year] Today's Date: 07/28/2017    History of Present Illness 77 y.o. male presenting as a code stroke with difficulty walking, drooling, and facial droop. PMH is significant for HTN, Alzheimer dementia, CKD III, and T2DM.  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     PT Comments    Patient lethargic today and not wanting to open eyes for PT despite max auditory stimulation, sternal rub, wet wash cloth. Attempted to get pt to engage and participate in session but pt not willing this date. Per tech, this is consistent for the last few days as pt sleeps most of the day and does not assist with any mobility. "I don't want to get better." Continues to demonstrate right gaze preference and left sided neglect as well as poor arousal and attention. Not sure if some of this is behavorial? Attempted to use family to help engage pt via phone and in person which helped minimally. Concerned about pt's ability to tolerate intensity of CIR. Discharge recommendation updated to SNF due to lack of progress and willingness to participate in PT services. Will continue to follow and progress as tolerated.    Follow Up Recommendations  SNF     Equipment Recommendations  None recommended by PT    Recommendations for Other Services       Precautions / Restrictions Precautions Precautions: Fall Precaution Comments: severe left neglect Restrictions Weight Bearing Restrictions: No    Mobility  Bed Mobility Overal bed mobility: Needs Assistance Bed Mobility: Rolling Rolling: Total assist;+2 for physical assistance         General bed mobility comments: Total A for rolling to optimize positioning. Declined any other movement despite maximal effort from PT/tech. Attempted using family as motivation as well but no effort.   Transfers                 General  transfer comment: Declined any mobility.   Ambulation/Gait             General Gait Details: unable   Stairs            Wheelchair Mobility    Modified Rankin (Stroke Patients Only) Modified Rankin (Stroke Patients Only) Pre-Morbid Rankin Score: No significant disability Modified Rankin: Severe disability     Balance Overall balance assessment: No apparent balance deficits (not formally assessed)(as pt declining any effort or initiation of movement)                                          Cognition Arousal/Alertness: Lethargic Behavior During Therapy: Flat affect Overall Cognitive Status: Impaired/Different from baseline Area of Impairment: Attention;Orientation;Following commands;Safety/judgement;Awareness;Problem solving                 Orientation Level: Disoriented to;Place;Time;Situation Current Attention Level: Focused   Following Commands: Follows one step commands inconsistently Safety/Judgement: Decreased awareness of deficits;Decreased awareness of safety Awareness: Intellectual Problem Solving: Slow processing;Decreased initiation;Difficulty sequencing;Requires verbal cues;Requires tactile cues General Comments: Pt not wanting to open eyes for much of session despite sternal rub, cold wash cloth, verbal stimulation. Follows less than 20% of 1 step commands with increased time and repetition. Decreased attention. "I don't want to get better." Right gaze preference. Not able to get to midline.       Exercises  General Comments General comments (skin integrity, edema, etc.): Granddaughter present during session.       Pertinent Vitals/Pain Pain Assessment: Faces Faces Pain Scale: No hurt    Home Living                      Prior Function            PT Goals (current goals can now be found in the care plan section) Progress towards PT goals: Not progressing toward goals - comment(lethargic; not wanting to  participate in PT, " don't want to get better." )    Frequency    Min 4X/week      PT Plan Discharge plan needs to be updated    Co-evaluation              AM-PAC PT "6 Clicks" Daily Activity  Outcome Measure  Difficulty turning over in bed (including adjusting bedclothes, sheets and blankets)?: Unable Difficulty moving from lying on back to sitting on the side of the bed? : Unable Difficulty sitting down on and standing up from a chair with arms (e.g., wheelchair, bedside commode, etc,.)?: Unable Help needed moving to and from a bed to chair (including a wheelchair)?: Total Help needed walking in hospital room?: Total Help needed climbing 3-5 steps with a railing? : Total 6 Click Score: 6    End of Session   Activity Tolerance: Patient limited by lethargy;Other (comment)(not wanting to participate) Patient left: in bed;with call bell/phone within reach;with bed alarm set;with family/visitor present Nurse Communication: Mobility status;Need for lift equipment PT Visit Diagnosis: Other abnormalities of gait and mobility (R26.89);Hemiplegia and hemiparesis Hemiplegia - Right/Left: Left Hemiplegia - dominant/non-dominant: Non-dominant Hemiplegia - caused by: Cerebral infarction     Time: 1337-1400 PT Time Calculation (min) (ACUTE ONLY): 23 min  Charges:  $Therapeutic Activity: 8-22 mins                    G Codes:       Noah Bush, PT, DPT [phone]     Blake Divine A Tequlia Gonsalves 07/28/2017, 2:39 PM

## 2017-07-28 NOTE — Discharge Instructions (Signed)
You were admitted and diagnosed with a stroke. It is important to control your high blood pressure, your high cholesterol, and your sugars after a stroke. We have discontinued your lisinopril-HCTZ and instead put you on metoprolol given your new diagnosis of heart failure. Your outpatient doctor can decide if you need a second medication for Blood pressure control. You will be contacted by cardiology for an outpatient 30 day event monitor, if you do not hear from them contact your PCP. Please continue aspirin and plavix for 3 months. After 3 months just use aspirin. You have also been started on atorvastatin. Please continue this medication   Please follow up with neurology and cardiology. Please follow up with your PCP within 1 week of discharge

## 2017-07-28 NOTE — H&P (Signed)
Physical Medicine and Rehabilitation Admission H&P    Chief Complaint  Patient presents with  . Functional deficits due to stroke superimposed on Alzheimer's dementia.     HPI: Noah Bush. is a 77 y.o. RH- male  with history of HTN, Alzheimer dementia, CKD III, and T2DM who was admitted on 07/23/17 from MD office with reports of fall, difficulty walking, drooling, right gaze preference and facial droop. History taken from chart review. CTA/P head revealed R-MCA territory infarct involving right temporal and parietal lobe with heavily calcified carotid bifurcations and moderate stenosis right M1 and distal R- MCA with luxury perfusion and no mismatch.    He was not felt to be candidate for thrombolysis due to large infarct and  Dr. Leonie Man recommended DAPT X 3 months.   Follow up MRI brain reviewed, showing right MCA infarct.  Per report, revealed large acute on subacute R-MCA territory infarct with petechial hemorrhage, regional right cerebrum mass effect without midline shift and moderate chronic SVD.  2 D echo showed EF 40-45% with mild diffuse hypokinesis-- possible sever hypokinesis basal mid inferior and inferoseptal myocardium with grade one diastolic dysfunction. Outpatient TEE/Loop recorder recommended after discharge.  Cognitive evaluation revealed severe cognitive communication impairment question baseline but able to follow basic commands with left visual neglect. He continues to have issues with lethargy requiring tactile cues for activity, has significant left oral motor weakness--started on dysphagia 1, thin liquids and severe left neglect with pusher tendency.  Therapy working on pre-gait activity and CIR recommended due to functional deficits.    Review of Systems  Unable to perform ROS: Mental acuity     Past Medical History:  Diagnosis Date  . Alzheimer's dementia    Archie Endo 07/23/2017  . CKD (chronic kidney disease), stage III ([place])    Archie Endo 07/23/2017  . Gout   .  Hypertension   . Type II diabetes mellitus ([place])    Archie Endo 07/23/2017    Past Surgical History:  Procedure Laterality Date  . COLONOSCOPY WITH PROPOFOL N/A 05/09/2015   Procedure: COLONOSCOPY WITH PROPOFOL;  Surgeon: Robert Bellow, MD;  Location: [facility] ENDOSCOPY;  Service: Endoscopy;  Laterality: N/A;  . TIBIA FRACTURE SURGERY Left     Family History  Problem Relation Age of Onset  . Diabetes Paternal Grandfather     Social History:  Lives with family. Retired. Used to work as a Presenter, broadcasting. Was independent PTA per reports. Per reports that  has never smoked, never used smokeless tobacco and does not drink alcohol or use drugs.    Allergies: No Known Allergies    Medications Prior to Admission  Medication Sig Dispense Refill  . aspirin EC 81 MG tablet Take 81 mg by mouth daily.    Marland Kitchen latanoprost (XALATAN) 0.005 % ophthalmic solution Place 1 drop into the left eye at bedtime.  3  . memantine (NAMENDA) 5 MG tablet Take 5 mg by mouth daily.      Drug Regimen Review  Drug regimen was reviewed and remains appropriate with no significant issues identified  Home: Home Living Family/patient expects to be discharged to:: Private residence Living Arrangements: Spouse/significant other, Children Available Help at Discharge: Family Type of Home: House  Lives With: Spouse, Son   Functional History: Prior Function Level of Independence: Needs assistance Gait / Transfers Assistance Needed: ambulatory without a device ADL's / Homemaking Assistance Needed: reports his son helps him shower in standing, wife does houskeeping Comments: pt is a poor historian, no family  bedside  Functional Status:  Mobility: Bed Mobility Overal bed mobility: Needs Assistance Bed Mobility: Supine to Sit, Sit to Supine Supine to sit: HOB elevated, +2 for physical assistance, Max assist Sit to supine: +2 for physical assistance General bed mobility comments: needing extensive cues and assist to  achieve EOB Transfers General transfer comment: unable - will need lift to get OOB Ambulation/Gait General Gait Details: unable    ADL: ADL Overall ADL's : Needs assistance/impaired Eating/Feeding: NPO, Bed level Eating/Feeding Details (indicate cue type and reason): brought unloaded spoon to mouth with R hand Grooming: Oral care, Bed level, Minimal assistance Grooming Details (indicate cue type and reason): assist to set up and orient toothbrush, difficulty coordinating spitting Upper Body Bathing: Maximal assistance, Sitting Lower Body Bathing: Total assistance, Sitting/lateral leans Upper Body Dressing : Maximal assistance, Sitting Lower Body Dressing: Total assistance, Bed level Toileting- Clothing Manipulation and Hygiene: Total assistance, Bed level  Cognition: Cognition Overall Cognitive Status: Impaired/Different from baseline Arousal/Alertness: Awake/alert Orientation Level: Oriented to person, Oriented to situation, Disoriented to time, Disoriented to place Attention: Focused, Sustained Focused Attention: Appears intact Sustained Attention: Impaired Sustained Attention Impairment: Verbal basic, Functional basic Memory: Impaired Memory Impairment: Storage deficit, Decreased recall of new information, Decreased short term memory Decreased Short Term Memory: Functional basic Awareness: Impaired Awareness Impairment: Intellectual impairment Problem Solving: Impaired Problem Solving Impairment: Functional basic Safety/Judgment: Impaired Cognition Arousal/Alertness: Lethargic Behavior During Therapy: Flat affect Overall Cognitive Status: Impaired/Different from baseline Area of Impairment: Attention, Orientation, Problem solving, Memory, Safety/judgement Orientation Level: Disoriented to, Situation Current Attention Level: Sustained Memory: Decreased short-term memory Safety/Judgement: Decreased awareness of deficits, Decreased awareness of safety Problem Solving:  Slow processing, Decreased initiation, Difficulty sequencing, Requires verbal cues, Requires tactile cues General Comments: pt initially lethergic.  he woke up and participated but didnt talk much.  pts brother vernon was present for PT session   Blood pressure (!) 101/53, pulse 64, temperature 98.7 F (37.1 C), temperature source Oral, resp. rate 18, height 5' 11"  (1.803 m), weight 124.3 kg (274 lb), SpO2 100 %. Physical Exam  Nursing note and vitals reviewed. Constitutional: He appears well-developed. He is sleeping and cooperative.  Lethargic male--responded to pain. Obese  HENT:  Head: Normocephalic and atraumatic.  Eyes: Right eye exhibits no discharge. Left eye exhibits no discharge.  dysconjugate gaze with right eye fixed to right lateral field. Right gaze preference but is able to turn eyes midline with forced head turn to the left.   Neck:  Keeps head fixed to right   Cardiovascular: Normal rate and regular rhythm.  Distant sounds.   Respiratory: Effort normal and breath sounds normal. No stridor. No respiratory distress. He has no wheezes.  GI: Soft. Bowel sounds are normal. He exhibits no distension. There is no tenderness.  Musculoskeletal: He exhibits edema.  Min edema right hand and left knees.   Neurological: He is alert.  A&Ox3 with significant delay in processing Left facial weakness with moderate to severe dysarthria. Wet voice but able to clear oral secretions.   Able to follow simple motor commands with max cues. Motor: RUE 3+/5 proximal to distal with ?tone RLE 2/5 proximal to distal LUE: 0/5 proximal to distal LLE: 2-/5 proximal to distal  Skin: Skin is warm and dry.  Well healed old incisions left knee with decreased ROM.   Psychiatric: His affect is blunt. His speech is delayed and slurred. He is slowed. Cognition and memory are impaired. He expresses inappropriate judgment.    Results for orders  placed or performed during the hospital encounter of 07/23/17  (from the past 48 hour(s))  Glucose, capillary     Status: Abnormal   Collection Time: 07/25/17  5:12 PM  Result Value Ref Range   Glucose-Capillary 110 (H) 65 - 99 mg/dL   Comment 1 Notify RN    Comment 2 Document in Chart   Glucose, capillary     Status: Abnormal   Collection Time: 07/25/17  9:39 PM  Result Value Ref Range   Glucose-Capillary 109 (H) 65 - 99 mg/dL  Basic metabolic panel     Status: Abnormal   Collection Time: 07/26/17  5:37 AM  Result Value Ref Range   Sodium 142 135 - 145 mmol/L   Potassium 5.3 (H) 3.5 - 5.1 mmol/L   Chloride 114 (H) 101 - 111 mmol/L   CO2 19 (L) 22 - 32 mmol/L   Glucose, Bld 92 65 - 99 mg/dL   BUN 18 6 - 20 mg/dL   Creatinine, Ser 1.37 (H) 0.61 - 1.24 mg/dL   Calcium 9.1 8.9 - 10.3 mg/dL   GFR calc non Af Amer 49 (L) >60 mL/min   GFR calc Af Amer 56 (L) >60 mL/min    Comment: (NOTE) The eGFR has been calculated using the CKD EPI equation. This calculation has not been validated in all clinical situations. eGFR's persistently <60 mL/min signify possible Chronic Kidney Disease.    Anion gap 9 5 - 15    Comment: Performed at [facility] [address], [place], [place] [address]  Glucose, capillary     Status: None   Collection Time: 07/26/17  6:43 AM  Result Value Ref Range   Glucose-Capillary 85 65 - 99 mg/dL   Comment 1 Notify RN    Comment 2 Document in Chart   Glucose, capillary     Status: None   Collection Time: 07/26/17 10:52 AM  Result Value Ref Range   Glucose-Capillary 86 65 - 99 mg/dL   Comment 1 Notify RN    Comment 2 Document in Chart   Glucose, capillary     Status: None   Collection Time: 07/26/17  1:21 PM  Result Value Ref Range   Glucose-Capillary 97 65 - 99 mg/dL   Comment 1 Notify RN    Comment 2 Document in Chart   Glucose, capillary     Status: None   Collection Time: 07/26/17  9:08 PM  Result Value Ref Range   Glucose-Capillary 85 65 - 99 mg/dL   Comment 1 Notify RN    Comment 2 Document in Chart    Basic metabolic panel     Status: Abnormal   Collection Time: 07/27/17  3:52 AM  Result Value Ref Range   Sodium 142 135 - 145 mmol/L   Potassium 4.7 3.5 - 5.1 mmol/L   Chloride 115 (H) 101 - 111 mmol/L   CO2 19 (L) 22 - 32 mmol/L   Glucose, Bld 114 (H) 65 - 99 mg/dL   BUN 18 6 - 20 mg/dL   Creatinine, Ser 1.36 (H) 0.61 - 1.24 mg/dL   Calcium 9.1 8.9 - 10.3 mg/dL   GFR calc non Af Amer 49 (L) >60 mL/min   GFR calc Af Amer 57 (L) >60 mL/min    Comment: (NOTE) The eGFR has been calculated using the CKD EPI equation. This calculation has not been validated in all clinical situations. eGFR's persistently <60 mL/min signify possible Chronic Kidney Disease.    Anion gap 8  5 - 15    Comment: Performed at [facility], [place] [address] [place], [place] [address]  Glucose, capillary     Status: Abnormal   Collection Time: 07/27/17  6:09 AM  Result Value Ref Range   Glucose-Capillary 125 (H) 65 - 99 mg/dL   Comment 1 Notify RN    Comment 2 Document in Chart   Glucose, capillary     Status: Abnormal   Collection Time: 07/27/17 11:28 AM  Result Value Ref Range   Glucose-Capillary 104 (H) 65 - 99 mg/dL   No results found.   Medical Problem List and Plan: 1.  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 2.  DVT Prophylaxis/Anticoagulation: start  Pharmaceutical: Lovenox 3. Pain Management: tylenol prn. Will add Sportscreme to help with joint stiffness bilateral hands and left knee.  4. Mood: Team to provide encouragement/ego support. LCSW to follow for evaluation when appropriate.  5. Alzheimer's dementia/ Neuropsych: This patient is not capable of making decisions on his own behalf. Continue Namenda   6. Skin/Wound Care: Pressure relief measures. Maintain adequate nutritional and hydration status.  7. Fluids/Electrolytes/Nutrition: Has been refusing  intake--needs assistance/feeding for adequate intake. Will add supplements (likes drinks cold)  to help with intake.  8. T2DM: Monitor BS ac/hs. Continue SSI and titrate as intake improves 9. HTN: Monitor BP bid--avoid hypotension. Continue Metoprolol XL.  10. CKD stage III: Monitor renal status. Push fluid intake.  11. Dyslipidemia: now on Lipitor.   12. Morbid obesity  Body mass index is 38.22 kg/m.  Diet and exercise education  Encourage weight loss to increase endurance and promote overall health   Post Admission Physician Evaluation: 1. Preadmission assessment reviewed and changes made below. 2. Functional deficits secondary  to large acute on subacute R-MCA territory infarct with petechial hemorrhage. 3. Patient is admitted to receive collaborative, interdisciplinary care between the physiatrist, rehab nursing staff, and therapy team. 4. Patient's level of medical complexity and substantial therapy needs in context of that medical necessity cannot be provided at a lesser intensity of care such as a SNF. 5. Patient has experienced substantial functional loss from his/her baseline which was documented above under the "Functional History" and "Functional Status" headings.  Judging by the patient's diagnosis, physical exam, and functional history, the patient has potential for functional progress which will result in measurable gains while on inpatient rehab.  These gains will be of substantial and practical use upon discharge  in facilitating mobility and self-care at the household level. 44. Physiatrist will provide 24 hour management of medical needs as well as oversight of the therapy plan/treatment and provide guidance as appropriate regarding the interaction of the two. 7. 24 hour rehab nursing will assist with bladder management, bowel management, safety, skin/wound care, disease management, medication administration, pain management and patient education  and help integrate therapy  concepts, techniques,education, etc. 8. PT will assess and treat for/with: Lower extremity strength, range of motion, stamina, balance, functional mobility, safety, adaptive techniques and equipment, woundcare, coping skills, pain control, education.   Goals are: Mod A. 9. OT will assess and treat for/with: ADL's, functional mobility, safety, upper extremity strength, adaptive techniques and equipment, wound mgt, ego support, and community reintegration.   Goals are: Mod A. Therapy may proceed with showering this patient. 10. SLP will assess and treat for/with: speech, language, cognition, swallowing.  Goals are: Min/Mod A. 11. Case Management  and Education officer, museum will assess and treat for psychological issues and discharge planning. 12. Team conference will be held weekly to assess progress toward goals and to determine barriers to discharge. 13. Patient will receive at least 3 hours of therapy per day at least 5 days per week. 14. ELOS: 22-27 days.       15. Prognosis:  good and fair  Delice Lesch, MD, ABPMR Bary Leriche, PA-C 07/27/2017

## 2017-07-28 NOTE — Care Management Note (Signed)
Case Management Note  Patient Details  Name: Noah RumpfJames Frickey Jr. MRN: [id number] Date of Birth: 1940-08-12  Subjective/Objective:    Pt admitted with CVA. He is from home with spouse.                 Action/Plan: Awaiting Insurance auth for [employer]. CM following for d/c disposition.   Expected Discharge Date:                  Expected Discharge Plan:  IP Rehab Facility  In-House Referral:     Discharge planning Services     Post Acute Care Choice:    Choice offered to:     DME Arranged:    DME Agency:     HH Arranged:    HH Agency:     Status of Service:  In process, will continue to follow  If discussed at Long Length of Stay Meetings, dates discussed:    Additional Comments:  Noah BaloKelli F Frazier Balfour, RN 07/28/2017, 1:19 PM

## 2017-07-28 NOTE — Progress Notes (Signed)
Inpatient Rehabilitation  Continuing to follow along for timing of medical readiness and insurance authorization.  Hopeful for a decision today.  Will update the team when I have a decision.  Call id questions.   Charlane FerrettiMelissa Keil Pickering, M.A., CCC/SLP Admission Coordinator  [facility]  Cell [phone]

## 2017-07-28 NOTE — Progress Notes (Signed)
Pt admitted to unit at 1830 from 3w. RN reviewed safety plan and rehab/therapy process with minimal engagement from pt about information. Pt has severe left neglect and inattention. Pt is poor historian with past medical history in which family is to be contacted for further information. Pt denies pain, bed alarm in place, and call bell with in reach. Continue Plan of care.

## 2017-07-28 NOTE — Progress Notes (Signed)
Family Medicine Teaching Service Daily Progress Note Intern Pager: [phone]  Patient name: Noah Bush. Medical record number: [id number] Date of birth: February 10, 1941 Age: 77 y.o. Gender: male  Primary Care Provider: Patient, No Pcp Per Consultants: Neurology, PT/OT/SLP, CIR Code Status: Full   Pt Overview and Major Events to Date:  Admitted to FPTS on 07/23/17  Assessment and Plan: Leanord Thibeau Jr.is a 76 y.o.malepresenting as a code strokewithdifficulty walking, drooling, and facial droop. PMH is significant forHTN, Alzheimer dementia, CKD III, and T2DM  Subacute R MCA Infarct On exam,this am (2/24) patient was able to answer questions and follow most commands. Could participate in most of Neuro exam. Neurology consulted, recommend dual antiplatelet therapy and statin, aggressive risk factor modification. Echocardiogram showing mild diffuse hypokinesis with distinct regional wall motion abnormalities. LVEF 40-45%. -neurologyfollowing, appreciate recommendations -plan for DAPT with ASA and clopidigrel x 3 mo -SLP, PT, and OT consults placed. Patient has active bed rest order and may get up with assistance. -will consult cardiology for 30 day event monitor   Elevated Troponin - resolved  Troponins were down trending (0.16>0.16>0.14) and no active chest pain this am. EKGon 2/21/19in NSR and known LBBB, with am EKG showing similar findings.   Hyperkalemia - resolved K of 5.1. Unclear etiology at this time, possibly related to medications. Urinary retention also a possible etiology given patient has a prescription for flomax 0.4mg  daily(thathas not filled since December 2018) anddoxazosin 4mg  daily (per [facility]).Patient denies symptoms and EKG without significant changes.  -monitor on daily BMP  Type 2 Diabetes CBGs range from 106-125 past 24hrs. A1C on 07/24/17 of 6.9patient on one home oral med but family unable to recall. Per discussion with [facility] home  medications include: glipizide ER 2.5mg  daily -contsSSI and CBGs  HTN Chronic. BP of 137/58 this am. 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 Current Cr1.39.Per chart review, baseline likely Cr ~1.6-1.8.  -avoid nephrotoxic agents -Monitor SCr  Alzheimer's dementia Per discussion with [facility], medications are as follows: memantine daily -Cont memantine daily  FEN/GI:dysphagia 1 Prophylaxis:SCDs until MRI confirms no hemorrhagic element to stroke  Disposition: will discharge to CIR when bed available   Subjective:  Patient today doing well. Denies SOB. When asked about CP he replies "I don't know, I guess we'll have to check". Denies pain.   Objective: Temp:  [97.7 F (36.5 C)-99.7 F (37.6 C)] 98.9 F (37.2 C) (02/26 0418) Pulse Rate:  [64-86] 74 (02/26 0418) Resp:  [18-20] 20 (02/26 0418) BP: (101-144)/(53-75) 137/58 (02/26 0418) SpO2:  [93 %-100 %] 94 % (02/26 0418) Physical Exam: General: awake, alert, oriented to person and time, not to place (states he is in store and when reoriented states "I'm still in [place]?")  Eyes: PERRL Cardiovascular: RRR, no MRG  Respiratory: CTAB, no wheezes, rales, or rhonchi  Abdomen: soft, non tender, non distended, bowel sounds normal  Extremities: non tender, no edema, 5/5 muscle strength in RUE, 2/5 strength in LUE, 2/5 muscle strength in lower extremities bilaterally  Neuro: oriented to person and time. Sensation intact bilaterally. Able to follow most commands (chose not to follow some commands)   Laboratory: Recent Labs  Lab 07/23/17 1325 07/23/17 1334 07/24/17 0546 07/25/17 0314  WBC 7.6  --  8.2 8.2  HGB 14.2 15.3 13.9 12.5*  HCT 41.2 45.0 40.8 37.8*  PLT 249  --  228 222    Recent Labs  Lab 07/23/17 1325  07/26/17 0537 07/27/17 0352 07/28/17 0531  NA 135   < > 142 142 143  K 5.6*   < > 5.3* 4.7 5.1  CL 108   < > 114* 115* 115*  CO2 17*   < > 19* 19* 19*  BUN 39*   < > 18 18 19   CREATININE 1.77*   < > 1.37* 1.36* 1.39*  CALCIUM 9.6   < > 9.1 9.1 9.0  PROT 7.7  --   --   --   --   BILITOT 0.7  --   --   --   --   ALKPHOS 50  --   --   --   --   ALT 15*  --   --   --   --   AST 20  --   --   --   --   GLUCOSE 107*   < > 92 114* 117*   < > = values in this interval not displayed.     Imaging/Diagnostic Tests: Ct Angio Head W Or Wo Contrast  Result Date: 07/23/2017 CLINICAL DATA:  Stroke. EXAM: CT ANGIOGRAPHY HEAD AND NECK CT PERFUSION BRAIN TECHNIQUE: Multidetector CT imaging of the head and neck was performed using the standard protocol during bolus administration of intravenous contrast. Multiplanar CT image reconstructions and MIPs were obtained to evaluate the vascular anatomy. Carotid stenosis measurements (when applicable) are obtained utilizing NASCET criteria, using the distal internal carotid diameter as the denominator. Multiphase CT imaging of the brain was performed following IV bolus contrast injection. Subsequent parametric perfusion maps were calculated using RAPID software. CONTRAST:  90mL ISOVUE-370 IOPAMIDOL (ISOVUE-370) INJECTION 76% COMPARISON:  CT head 07/23/2017 FINDINGS: CTA NECK FINDINGS Aortic arch: Atherosclerotic disease in the aortic arch. 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 Ascending aorta measures 37 mm in diameter. Atherosclerotic disease in the proximal great vessels which are patent. Right carotid system: Calcified plaque right carotid bifurcation. Less than 25% diameter stenosis right internal carotid artery at the origin. Left carotid system: Atherosclerotic disease at the proximal left common carotid artery which is mildly narrowed. Atherosclerotic calcification left carotid bifurcation with less than 25% diameter  stenosis. Diffusely diseased left common carotid artery Vertebral arteries: Right vertebral artery dominant and patent to the basilar. Calcified plaque at the origin. Small left vertebral artery ends in PICA. Skeleton: Cervical degenerative change. No acute skeletal abnormality. Other neck: Negative for mass or adenopathy. Upper chest: Negative Review of the MIP images confirms the above findings CTA HEAD FINDINGS Anterior circulation: Cavernous carotid is heavily calcified bilaterally with moderate stenosis right greater than left Moderate stenosis proximal right M1 segment. Moderate disease right middle cerebral artery bifurcation with moderate stenosis at the origin of the posterior division right MCA. Anterior division of the right middle cerebral artery irregular but patent. Mild stenosis left M1 segment. Moderate stenosis distal left M1. Left middle cerebral artery is patent with scattered atherosclerotic disease. Both anterior cerebral arteries patent Posterior circulation: Mild stenosis distal right vertebral artery which supplies the basilar. Left vertebral artery ends in PICA. PICA patent bilaterally. Basilar widely patent. Superior cerebellar arteries patent bilaterally. Fetal origin right posterior cerebral artery. Occlusion distal right posterior cerebral artery. Diffuse atherosclerotic disease left posterior cerebral artery. Venous sinuses: Patent Anatomic variants: None Delayed phase: Not perform Review of the MIP images confirms the above findings CT  Brain Perfusion Findings: CBF (<30%) Volume: 28mL Perfusion (Tmax>6.0s) volume: 31mL Mismatch Volume: 3mL Infarction Location:Right MCA territory involving the right temporal lobe and right parietal lobe. CT perfusion volume is smaller than that seen on the recent head CT. This may be due to subacute infarct and pseudo normalization of the CT perfusion. Overall, the CT scan is felt to be more accurate representation of the large territory right MCA  infarct. IMPRESSION: Heavily calcified carotid bifurcation bilaterally with mild stenosis bilaterally. Heavily calcified moderate stenosis of the cavernous carotid bilaterally. Moderate stenosis proximal right M1 segment and distal right middle cerebral artery. Right middle cerebral arteries are diseased but patent. Mild left middle cerebral artery stenosis proximally and moderate stenosis distal left middle cerebral artery. Diffuse intracranial atherosclerotic disease with occlusion of the right posterior cerebral artery and diffuse disease in the left posterior cerebral artery. CT perfusion compatible with subacute infarct right MCA territory. CT perfusion under represents the size of the infarct based on CT Head compatible with subacute infarct and pseudo normalization. These results were reviewed in person at the time of interpretation on 07/23/2017 at 2:21 pm to Dr. Delia Heady , who verbally acknowledged these results. Electronically Signed   By: Marlan Palau M.D.   On: 07/23/2017 14:23   Ct Angio Neck W Or Wo Contrast  Result Date: 07/23/2017 CLINICAL DATA:  Stroke. EXAM: CT ANGIOGRAPHY HEAD AND NECK CT PERFUSION BRAIN TECHNIQUE: Multidetector CT imaging of the head and neck was performed using the standard protocol during bolus administration of intravenous contrast. Multiplanar CT image reconstructions and MIPs were obtained to evaluate the vascular anatomy. Carotid stenosis measurements (when applicable) are obtained utilizing NASCET criteria, using the distal internal carotid diameter as the denominator. Multiphase CT imaging of the brain was performed following IV bolus contrast injection. Subsequent parametric perfusion maps were calculated using RAPID software. CONTRAST:  90mL ISOVUE-370 IOPAMIDOL (ISOVUE-370) INJECTION 76% COMPARISON:  CT head 07/23/2017 FINDINGS: CTA NECK FINDINGS Aortic arch: Atherosclerotic disease in the aortic arch. 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 Ascending  aorta measures 37 mm in diameter. Atherosclerotic disease in the proximal great vessels which are patent. Right carotid system: Calcified plaque right carotid bifurcation. Less than 25% diameter stenosis right internal carotid artery at the origin. Left carotid system: Atherosclerotic disease at the proximal left common carotid artery which is mildly narrowed. Atherosclerotic calcification left carotid bifurcation with less than 25% diameter stenosis. Diffusely diseased left common carotid artery Vertebral arteries: Right vertebral artery dominant and patent to the basilar. Calcified plaque at the origin. Small left vertebral artery ends in PICA. Skeleton: Cervical degenerative change. No acute skeletal abnormality. Other neck: Negative for mass or adenopathy. Upper chest: Negative Review of the MIP images confirms the above findings CTA HEAD FINDINGS Anterior circulation: Cavernous carotid is heavily calcified bilaterally with moderate stenosis right greater than left Moderate stenosis proximal right M1 segment. Moderate disease right middle cerebral artery bifurcation with moderate stenosis at the origin of the posterior division right MCA. Anterior division of the right middle cerebral artery irregular but patent. Mild stenosis left M1 segment. Moderate stenosis distal left M1. Left middle cerebral artery is patent with scattered atherosclerotic disease. Both anterior cerebral arteries patent Posterior circulation: Mild stenosis distal right vertebral artery which supplies the basilar. Left vertebral artery ends in PICA. PICA patent bilaterally. Basilar widely patent. Superior cerebellar arteries patent bilaterally. Fetal origin right posterior cerebral artery. Occlusion distal right posterior cerebral artery. Diffuse atherosclerotic disease left posterior cerebral artery. Venous sinuses:  Patent Anatomic variants: None Delayed phase: Not perform Review of the MIP images confirms the above findings CT Brain  Perfusion Findings: CBF (<30%) Volume: 28mL Perfusion (Tmax>6.0s) volume: 31mL Mismatch Volume: 3mL Infarction Location:Right MCA territory involving the right temporal lobe and right parietal lobe. CT perfusion volume is smaller than that seen on the recent head CT. This may be due to subacute infarct and pseudo normalization of the CT perfusion. Overall, the CT scan is felt to be more accurate representation of the large territory right MCA infarct. IMPRESSION: Heavily calcified carotid bifurcation bilaterally with mild stenosis bilaterally. Heavily calcified moderate stenosis of the cavernous carotid bilaterally. Moderate stenosis proximal right M1 segment and distal right middle cerebral artery. Right middle cerebral arteries are diseased but patent. Mild left middle cerebral artery stenosis proximally and moderate stenosis distal left middle cerebral artery. Diffuse intracranial atherosclerotic disease with occlusion of the right posterior cerebral artery and diffuse disease in the left posterior cerebral artery. CT perfusion compatible with subacute infarct right MCA territory. CT perfusion under represents the size of the infarct based on CT Head compatible with subacute infarct and pseudo normalization. These results were reviewed in person at the time of interpretation on 07/23/2017 at 2:21 pm to Dr. Delia Heady , who verbally acknowledged these results. Electronically Signed   By: Marlan Palau M.D.   On: 07/23/2017 14:23   Mr Brain Wo Contrast  Result Date: 07/24/2017 CLINICAL DATA:  Follow-up code stroke. Subacute RIGHT MCA infarct. Difficulty walking, facial droop. History of Alzheimer's disease, advanced intracranial atherosclerosis. EXAM: MRI HEAD WITHOUT CONTRAST TECHNIQUE: Multiplanar, multiecho pulse sequences of the brain and surrounding structures were obtained without intravenous contrast. COMPARISON:  CT HEAD July 23, 2017 FINDINGS: INTRACRANIAL CONTENTS: Reduced diffusion and  susceptibility artifact RIGHT frontotemporal parietal cortex and, RIGHT basal ganglia with patchy low ADC values RIGHT lenticulostriate nucleus, superior aspect RIGHT frontotemporal parietal cortex. Regional mass effect without midline shift. Mild effacement RIGHT lateral ventricle without LEFT ventricle entrapment. Patchy to confluent supratentorial white matter FLAIR T2 hyperintensities exclusive a aforementioned abnormality. VASCULAR: Normal major intracranial vascular flow voids present at skull base. SKULL AND UPPER CERVICAL SPINE: No abnormal sellar expansion. No suspicious calvarial bone marrow signal. Craniocervical junction maintained. SINUSES/ORBITS: Trace paranasal sinus mucosal thickening. Mastoid air cells are well aerated. Included ocular globes and orbital contents are non-suspicious. OTHER: None. IMPRESSION: 1. Large acute on subacute RIGHT MCA territory infarct with petechial hemorrhage. 2. Regional RIGHT cerebrum mass effect without midline shift. Mildly effaced RIGHT lateral ventricle without LEFT ventricle entrapment. 3. Moderate chronic small vessel ischemic disease. Electronically Signed   By: Awilda Metro M.D.   On: 07/24/2017 05:19   Ct Abdomen Pelvis W Contrast  Result Date: 06/29/2017 CLINICAL DATA:  Lower abdominal pain. EXAM: CT ABDOMEN AND PELVIS WITH CONTRAST TECHNIQUE: Multidetector CT imaging of the abdomen and pelvis was performed using the standard protocol following bolus administration of intravenous contrast. CONTRAST:  ISOVUE-300 IOPAMIDOL (ISOVUE-300) INJECTION 61% COMPARISON:  01/29/2013 FINDINGS: Lower chest: Linear bibasilar scarring. No effusions. Coronary artery calcifications. Heart is borderline in size. Hepatobiliary: Layering gallstones within the gallbladder. A small hypodensity in the right hepatic lobe is stable since prior study and likely reflects a small cyst. No biliary ductal dilatation. Pancreas: No focal abnormality or ductal dilatation.  Spleen: No focal abnormality.  Normal size. Adrenals/Urinary Tract: Several right renal cysts. No hydronephrosis bilaterally. Adrenal glands and urinary bladder unremarkable. Stomach/Bowel: Sigmoid diverticulosis. No active diverticulitis. Appendix is normal. Stomach and small bowel  decompressed, unremarkable. Vascular/Lymphatic: Diffuse aortic and iliac calcifications. No aneurysm or adenopathy. Reproductive: Central prostate calcifications. Other: No free fluid or free air. Musculoskeletal: No acute bony abnormality. Chronic appearing compression fractures at T12 and L1. IMPRESSION: Bibasilar scarring. Layering high-density material within the gallbladder, likely small stones. Sigmoid diverticulosis.  No active diverticulitis. Aortoiliac atherosclerosis. No acute findings in the abdomen or pelvis. Electronically Signed   By: Charlett NoseKevin  Dover M.D.   On: 06/29/2017 22:26   Ct Cerebral Perfusion W Contrast  Result Date: 07/23/2017 CLINICAL DATA:  Stroke. EXAM: CT ANGIOGRAPHY HEAD AND NECK CT PERFUSION BRAIN TECHNIQUE: Multidetector CT imaging of the head and neck was performed using the standard protocol during bolus administration of intravenous contrast. Multiplanar CT image reconstructions and MIPs were obtained to evaluate the vascular anatomy. Carotid stenosis measurements (when applicable) are obtained utilizing NASCET criteria, using the distal internal carotid diameter as the denominator. Multiphase CT imaging of the brain was performed following IV bolus contrast injection. Subsequent parametric perfusion maps were calculated using RAPID software. CONTRAST:  90mL ISOVUE-370 IOPAMIDOL (ISOVUE-370) INJECTION 76% COMPARISON:  CT head 07/23/2017 FINDINGS: CTA NECK FINDINGS Aortic arch: Atherosclerotic disease in the aortic arch. 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 Ascending aorta measures 37 mm in diameter. Atherosclerotic disease in the proximal great vessels which are patent. Right carotid system:  Calcified plaque right carotid bifurcation. Less than 25% diameter stenosis right internal carotid artery at the origin. Left carotid system: Atherosclerotic disease at the proximal left common carotid artery which is mildly narrowed. Atherosclerotic calcification left carotid bifurcation with less than 25% diameter stenosis. Diffusely diseased left common carotid artery Vertebral arteries: Right vertebral artery dominant and patent to the basilar. Calcified plaque at the origin. Small left vertebral artery ends in PICA. Skeleton: Cervical degenerative change. No acute skeletal abnormality. Other neck: Negative for mass or adenopathy. Upper chest: Negative Review of the MIP images confirms the above findings CTA HEAD FINDINGS Anterior circulation: Cavernous carotid is heavily calcified bilaterally with moderate stenosis right greater than left Moderate stenosis proximal right M1 segment. Moderate disease right middle cerebral artery bifurcation with moderate stenosis at the origin of the posterior division right MCA. Anterior division of the right middle cerebral artery irregular but patent. Mild stenosis left M1 segment. Moderate stenosis distal left M1. Left middle cerebral artery is patent with scattered atherosclerotic disease. Both anterior cerebral arteries patent Posterior circulation: Mild stenosis distal right vertebral artery which supplies the basilar. Left vertebral artery ends in PICA. PICA patent bilaterally. Basilar widely patent. Superior cerebellar arteries patent bilaterally. Fetal origin right posterior cerebral artery. Occlusion distal right posterior cerebral artery. Diffuse atherosclerotic disease left posterior cerebral artery. Venous sinuses: Patent Anatomic variants: None Delayed phase: Not perform Review of the MIP images confirms the above findings CT Brain Perfusion Findings: CBF (<30%) Volume: 28mL Perfusion (Tmax>6.0s) volume: 31mL Mismatch Volume: 3mL Infarction Location:Right MCA  territory involving the right temporal lobe and right parietal lobe. CT perfusion volume is smaller than that seen on the recent head CT. This may be due to subacute infarct and pseudo normalization of the CT perfusion. Overall, the CT scan is felt to be more accurate representation of the large territory right MCA infarct. IMPRESSION: Heavily calcified carotid bifurcation bilaterally with mild stenosis bilaterally. Heavily calcified moderate stenosis of the cavernous carotid bilaterally. Moderate stenosis proximal right M1 segment and distal right middle cerebral artery. Right middle cerebral arteries are diseased but patent. Mild left middle cerebral artery stenosis proximally and moderate stenosis distal  left middle cerebral artery. Diffuse intracranial atherosclerotic disease with occlusion of the right posterior cerebral artery and diffuse disease in the left posterior cerebral artery. CT perfusion compatible with subacute infarct right MCA territory. CT perfusion under represents the size of the infarct based on CT Head compatible with subacute infarct and pseudo normalization. These results were reviewed in person at the time of interpretation on 07/23/2017 at 2:21 pm to Dr. Delia Heady , who verbally acknowledged these results. Electronically Signed   By: Marlan Palau M.D.   On: 07/23/2017 14:23   Dg Chest Port 1 View  Result Date: 07/23/2017 CLINICAL DATA:  Slurred speech. EXAM: PORTABLE CHEST 1 VIEW COMPARISON:  04/04/2013 FINDINGS: 1616 hours. Low lung volumes with lordotic positioning. The cardio pericardial silhouette is enlarged. No overt pulmonary edema. Interstitial markings are diffusely coarsened with chronic features. No focal lung consolidation or substantial pleural effusion. The visualized bony structures of the thorax are intact. Telemetry leads overlie the chest. IMPRESSION: Cardiomegaly without acute cardiopulmonary findings. Electronically Signed   By: Kennith Center M.D.   On:  07/23/2017 16:32   Ct Head Code Stroke Wo Contrast  Result Date: 07/23/2017 CLINICAL DATA:  Code stroke. 77 y/o M; fall yesterday, unable to walk with new onset today. EXAM: CT HEAD WITHOUT CONTRAST TECHNIQUE: Contiguous axial images were obtained from the base of the skull through the vertex without intravenous contrast. COMPARISON:  05/13/2013 CT head. FINDINGS: Brain: Large right MCA distribution late acute to subacute infarction involving the anterolateral temporal lobe, caudate head, putamen, and insula. Edema and local mass effect with partial effacement of the right lateral ventricle. Minimal right-sided uncal herniation. No significant midline shift. No hemorrhage. No additional area of infarction identified. Stable background of chronic microvascular ischemic changes and parenchymal volume loss of the brain. Vascular: Increased density of right distal M1 and proximal M2 branches may represent thrombus. Calcific atherosclerosis of carotid siphons. Skull: Normal. Negative for fracture or focal lesion. Sinuses/Orbits: No acute finding. Other: Right-sided glaucoma device. ASPECTS [facility] Stroke Program Early CT Score) - Ganglionic level infarction (caudate, lentiform nuclei, internal capsule, insula, M1-M3 cortex): 1 - Supraganglionic infarction (M4-M6 cortex): 3 Total score (0-10 with 10 being normal): 4 IMPRESSION: 1. Right inferior MCA distribution late acute to subacute infarction involving temporal lobe, insula, and basal ganglia. Mass effect with partial effacement of right lateral ventricle and minimal right uncal herniation. No significant midline shift. No appreciable hemorrhage. 2. Density of right distal M1 and proximal M2 branches may represent thrombus. 3. ASPECTS is 4 These results were communicated to Dr. Pearlean Brownie at 1:46 pmon 2/21/2019by text page via the [facility] messaging system. Electronically Signed   By: Mitzi Hansen M.D.   On: 07/23/2017 13:47    Oralia Manis,  DO 07/28/2017, 9:01 AM PGY-1, [facility] FPTS Intern pager: [phone], text pages welcome

## 2017-07-28 NOTE — Progress Notes (Signed)
Inpatient Rehabilitation  Was notified by PT that patient did not have a good session today; discussed with son and patient.  Patient states that he just wants to go home.  We further discussed that family cannot care for him an current level and he needs to do therapy in order to get home.  They are both in agreement with rehab plans and son states that he can help encourage patient as needed.    Charlane FerrettiMelissa Delanie Tirrell, M.A., CCC/SLP Admission Coordinator  [facility]  Cell [phone]

## 2017-07-28 NOTE — Progress Notes (Signed)
Inpatient Rehabilitation  I have received insurance authorization for an IP Rehab admission as well as medical clearance to proceed with admission today.  I have discussed plan with spouse and wife, who are in agreement with plan.  Plan to proceed with admission.  Call if questions.   Charlane FerrettiMelissa Emersyn Wyss, M.A., CCC/SLP Admission Coordinator  [facility]  Cell [phone]

## 2017-07-29 ENCOUNTER — Inpatient Hospital Stay (HOSPITAL_COMMUNITY): Payer: Medicare Other | Admitting: Occupational Therapy

## 2017-07-29 ENCOUNTER — Other Ambulatory Visit: Payer: Self-pay

## 2017-07-29 ENCOUNTER — Inpatient Hospital Stay (HOSPITAL_COMMUNITY): Payer: Medicare Other | Admitting: Speech Pathology

## 2017-07-29 ENCOUNTER — Inpatient Hospital Stay (HOSPITAL_COMMUNITY): Payer: Medicare Other

## 2017-07-29 ENCOUNTER — Inpatient Hospital Stay (HOSPITAL_COMMUNITY): Payer: Medicare Other | Admitting: Physical Therapy

## 2017-07-29 DIAGNOSIS — E669 Obesity, unspecified: Secondary | ICD-10-CM

## 2017-07-29 DIAGNOSIS — I10 Essential (primary) hypertension: Secondary | ICD-10-CM

## 2017-07-29 DIAGNOSIS — I63511 Cerebral infarction due to unspecified occlusion or stenosis of right middle cerebral artery: Secondary | ICD-10-CM

## 2017-07-29 DIAGNOSIS — N183 Chronic kidney disease, stage 3 (moderate): Secondary | ICD-10-CM

## 2017-07-29 DIAGNOSIS — E1169 Type 2 diabetes mellitus with other specified complication: Secondary | ICD-10-CM

## 2017-07-29 DIAGNOSIS — D62 Acute posthemorrhagic anemia: Secondary | ICD-10-CM

## 2017-07-29 LAB — GLUCOSE, CAPILLARY
GLUCOSE-CAPILLARY: 126 mg/dL — AB (ref 65–99)
GLUCOSE-CAPILLARY: 118 mg/dL — AB (ref 65–99)
Glucose-Capillary: 106 mg/dL — ABNORMAL HIGH (ref 65–99)
Glucose-Capillary: 118 mg/dL — ABNORMAL HIGH (ref 65–99)

## 2017-07-29 LAB — COMPREHENSIVE METABOLIC PANEL
ALBUMIN: 2.7 g/dL — AB (ref 3.5–5.0)
ALT: 13 U/L — ABNORMAL LOW (ref 17–63)
ANION GAP: 9 (ref 5–15)
AST: 21 U/L (ref 15–41)
Alkaline Phosphatase: 51 U/L (ref 38–126)
BILIRUBIN TOTAL: 0.5 mg/dL (ref 0.3–1.2)
BUN: 26 mg/dL — AB (ref 6–20)
CHLORIDE: 112 mmol/L — AB (ref 101–111)
CO2: 19 mmol/L — ABNORMAL LOW (ref 22–32)
Calcium: 9.3 mg/dL (ref 8.9–10.3)
Creatinine, Ser: 1.38 mg/dL — ABNORMAL HIGH (ref 0.61–1.24)
GFR calc Af Amer: 56 mL/min — ABNORMAL LOW (ref >60)
GFR, EST NON AFRICAN AMERICAN: 48 mL/min — AB (ref >60)
Glucose, Bld: 133 mg/dL — ABNORMAL HIGH (ref 65–99)
POTASSIUM: 4.4 mmol/L (ref 3.5–5.1)
Sodium: 140 mmol/L (ref 135–145)
Total Protein: 6.9 g/dL (ref 6.5–8.1)

## 2017-07-29 LAB — CBC WITH DIFFERENTIAL/PLATELET
Basophils Absolute: 0 10*3/uL (ref 0.0–0.1)
Basophils Relative: 0 %
EOS PCT: 3 %
Eosinophils Absolute: 0.3 10*3/uL (ref 0.0–0.7)
HCT: 37.1 % — ABNORMAL LOW (ref 39.0–52.0)
Hemoglobin: 12.3 g/dL — ABNORMAL LOW (ref 13.0–17.0)
Lymphocytes Relative: 32 %
Lymphs Abs: 3.1 10*3/uL (ref 0.7–4.0)
MCH: 27.2 pg (ref 26.0–34.0)
MCHC: 33.2 g/dL (ref 30.0–36.0)
MCV: 82.1 fL (ref 78.0–100.0)
MONO ABS: 0.6 10*3/uL (ref 0.1–1.0)
MONOS PCT: 6 %
Neutro Abs: 5.9 10*3/uL (ref 1.7–7.7)
Neutrophils Relative %: 59 %
PLATELETS: 207 10*3/uL (ref 150–400)
RBC: 4.52 MIL/uL (ref 4.22–5.81)
RDW: 14.3 % (ref 11.5–15.5)
WBC: 9.9 10*3/uL (ref 4.0–10.5)

## 2017-07-29 MED ORDER — MUSCLE RUB 10-15 % EX CREA
TOPICAL_CREAM | Freq: Four times a day (QID) | CUTANEOUS | Status: DC
  Administered 2017-07-29 – 2017-08-15 (×61): via TOPICAL
  Administered 2017-08-16: 1 via TOPICAL
  Administered 2017-08-16 – 2017-08-21 (×17): via TOPICAL
  Administered 2017-08-21: 1 via TOPICAL
  Administered 2017-08-21 – 2017-08-25 (×14): via TOPICAL
  Filled 2017-07-29 (×2): qty 85

## 2017-07-29 MED ORDER — NEPRO/CARBSTEADY PO LIQD
237.0000 mL | Freq: Three times a day (TID) | ORAL | Status: DC
  Administered 2017-07-29 – 2017-08-11 (×39): 237 mL via ORAL

## 2017-07-29 MED ORDER — RENA-VITE PO TABS
1.0000 | ORAL_TABLET | Freq: Every day | ORAL | Status: DC
  Administered 2017-07-29 – 2017-08-23 (×25): 1 via ORAL
  Filled 2017-07-29 (×25): qty 1

## 2017-07-29 MED ORDER — TROLAMINE SALICYLATE 10 % EX CREA: TOPICAL_CREAM | Freq: Three times a day (TID) | CUTANEOUS | Status: DC

## 2017-07-29 MED ORDER — PRO-STAT SUGAR FREE PO LIQD
30.0000 mL | Freq: Three times a day (TID) | ORAL | Status: DC
  Administered 2017-07-29 – 2017-08-14 (×42): 30 mL via ORAL
  Filled 2017-07-29 (×40): qty 30

## 2017-07-29 NOTE — Progress Notes (Signed)
PMR Admission Coordinator Pre-Admission Assessment  Patient: Noah Bush. is an 77 y.o., male MRN: [id number] DOB: 30-Mar-1941 Height: 5\' 11"  (180.3 cm) Weight: 124.3 kg (274 lb)                                                                                                                                                  Insurance Information HMO: X    PPO:      PCP:      IPA:      80/20:      OTHER:  PRIMARY: UHC Medicare       Policy#: [id number]      Subscriber: Self CM Name: Noreene Larsson      Phone#: [phone] or [phone]     Fax#: [phone] Pre-Cert#: [id number]      Employer: Retired Benefits:  Phone #: [phone]     Name: Verified online at [email or web address] Eff. Date: 06/02/17     Deduct: $0      Out of Pocket Max: [phone]      Life Max: N/A CIR: $430 a day, days 1-4; $0 a day, days 5+      SNF: $0 a day, days 1-20; $160 a day, days 21-62; $0 a day, days 63-100 Outpatient: Necessity      Co-Pay: $40 per visit  Home Health: Necessity, 100%       Co-Pay: $0 DME: 80%     Co-Pay: 20% Providers: In-network   SECONDARY: None        Medicaid Application Date:       Case Manager:  Disability Application Date:       Case Worker:   Emergency Contact Information        Contact Information    Name Relation Home Work [place] [phone]     Dacey,[name] Son   [phone]     Current Medical History  Patient Admitting Diagnosis: 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 He was not felt to be  candidate for thrombolysis due to large infarct and Dr. Pearlean Brownie recommended DAPT X 3 months.   Follow up MRI brain revealed large acute on subacute R-MCA territory infarct with petechial hemorrhage, regional right cerebrum mass effect without midline shift and moderate chronic SVD. 2 D echo showed EF 40-45% with mild diffuse hypokinesis-- possible sever hypokinesis basal mid inferior and inferoseptal myocardium with grade one diastolic dysfunction. May need outpatient TEE/Loop recorder per neuro. Cognitive evaluation revealed  severe cognitive communication impairment question baseline but able to follow basic commands with left visual neglect. He continues to have issues withlethargyrequiring tactile cues for activity,has significant left oral motor weakness--started on dysphagia 1 textures, thin liquids andsevere left neglect with pusher tendency.Therapy working on pre-gait activity and CIR recommended due to functional deficits.  NIH Total: 20  Past Medical History      Past Medical History:  Diagnosis Date  . Alzheimer's dementia    Noah Bush 07/23/2017  . Cerebral infarction (HCC) 07/27/2017  . CKD (chronic kidney disease), stage III (HCC)    Noah Bush 07/23/2017  . Gout   . Hypertension   . Type II diabetes mellitus (HCC)    Noah Bush 07/23/2017    Family History  family history includes Diabetes in his paternal grandfather.  Prior Rehab/Hospitalizations:  Has the patient had major surgery during 100 days prior to admission? No  Current Medications   Current Facility-Administered Medications:  .  acetaminophen (TYLENOL) tablet 650 mg, 650 mg, Oral, Q4H PRN **OR** acetaminophen (TYLENOL) solution 650 mg, 650 mg, Per Tube, Q4H PRN **OR** acetaminophen (TYLENOL) suppository 650 mg, 650 mg, Rectal, Q4H PRN, Lockamy, Timothy, DO .  albuterol (PROVENTIL) (2.5 MG/3ML) 0.083% nebulizer solution 2.5 mg, 2.5 mg, Nebulization, Q6H PRN, Darin Engels, Sherin, DO .  aspirin EC tablet 81  mg, 81 mg, Oral, Daily, Mayo, Allyn Kenner, MD .  atorvastatin (LIPITOR) tablet 80 mg, 80 mg, Oral, q1800, Costello, Mary A, NP, 80 mg at 07/27/17 1740 .  clopidogrel (PLAVIX) tablet 75 mg, 75 mg, Oral, Daily, Costello, Mary A, NP, 75 mg at 07/28/17 1037 .  insulin aspart (novoLOG) injection 0-9 Units, 0-9 Units, Subcutaneous, TID WC, Lockamy, Timothy, DO, 1 Units at 07/28/17 1212 .  memantine (NAMENDA) tablet 5 mg, 5 mg, Oral, Daily, Artist Pais, Elsia J, DO, 5 mg at 07/28/17 1037 .  metoprolol succinate (TOPROL-XL) 24 hr tablet 12.5 mg, 12.5 mg, Oral, Daily, Darin Engels, Sherin, DO, 12.5 mg at 07/28/17 1037  Patients Current Diet: Fall precautions DIET - DYS 1 Room service appropriate? Yes; Fluid consistency: Thin Diet - low sodium heart healthy  Precautions / Restrictions Precautions Precautions: Fall Precaution Comments: severe left neglect Restrictions Weight Bearing Restrictions: No   Has the patient had 2 or more falls or a fall with injury in the past year?No  Prior Activity Level Community (5-7x/wk): Prior to admission patient was fully independent.  Wife reports he went out daily, drove to the car wash and would visit with people.    Home Assistive Devices / Equipment  Prior Device Use: Indicate devices/aids used by the patient prior to current illness, exacerbation or injury? shower chair   Prior Functional Level Prior Function Level of Independence: Needs assistance Gait / Transfers Assistance Needed: ambulatory without a device ADL's / Homemaking Assistance Needed: reports his son helps him shower in standing, wife does houskeeping Comments: pt is a poor historian, no family bedside  Self Care: Did the patient need help bathing, dressing, using the toilet or eating? Independent  Indoor Mobility: Did the patient need assistance with walking from room to room (with or without device)? Independent  Stairs: Did the patient need assistance with internal or external stairs  (with or without device)? Independent  Functional Cognition: Did the patient need help planning regular tasks such as shopping or remembering to take medications? Independent  Current Functional Level Cognition  Arousal/Alertness: Awake/alert Overall Cognitive Status: Impaired/Different from baseline Current Attention Level: Focused Orientation Level: Oriented to person, Disoriented to time, Disoriented  to situation, Oriented to place Following Commands: Follows one step commands inconsistently Safety/Judgement: Decreased awareness of deficits, Decreased awareness of safety General Comments: Pt not wanting to open eyes for much of session despite sternal rub, cold wash cloth, verbal stimulation. Follows less than 20% of 1 step commands with increased time and repetition. Decreased attention. "I don't want to get better." Right gaze preference. Not able to get to midline.  Attention: Focused, Sustained Focused Attention: Appears intact Sustained Attention: Impaired Sustained Attention Impairment: Verbal basic, Functional basic Memory: Impaired Memory Impairment: Storage deficit, Decreased recall of new information, Decreased short term memory Decreased Short Term Memory: Functional basic Awareness: Impaired Awareness Impairment: Intellectual impairment Problem Solving: Impaired Problem Solving Impairment: Functional basic Safety/Judgment: Impaired    Extremity Assessment (includes Sensation/Coordination)  Upper Extremity Assessment: LUE deficits/detail LUE Deficits / Details: 3-/5 shoulder and elbow, no wrist or hand movement noted, no pain response, movement only elicited with functional activity, not upon command LUE Sensation: decreased light touch, decreased proprioception(neglect) LUE Coordination: decreased fine motor, decreased gross motor  Lower Extremity Assessment: Defer to PT evaluation LLE Deficits / Details: no active movement noted. No response to light  touch/stimuli.     ADLs  Overall ADL's : Needs assistance/impaired Eating/Feeding: NPO, Bed level Eating/Feeding Details (indicate cue type and reason): brought unloaded spoon to mouth with R hand Grooming: Oral care, Bed level, Wash/dry face, Maximal assistance Grooming Details (indicate cue type and reason): Pt requiring Max cues to participate in grooming tasks at bed level. Pt stating "dont touch my face!" Pt unable to maintain arousal long enough to continue to participate Upper Body Bathing: Maximal assistance, Sitting Lower Body Bathing: Total assistance, Sitting/lateral leans Upper Body Dressing : Maximal assistance, Sitting Lower Body Dressing: Total assistance, Bed level Toileting- Clothing Manipulation and Hygiene: Total assistance, Bed level General ADL Comments: Pt requiring Max A for grooming at bed level. Max A for bed mobility. Facilitate optimal position to increase arousal and elevate BUEs for edema.     Mobility  Overal bed mobility: Needs Assistance Bed Mobility: Rolling Rolling: Total assist, +2 for physical assistance Supine to sit: HOB elevated, +2 for physical assistance, Max assist Sit to supine: +2 for physical assistance General bed mobility comments: Total A for rolling to optimize positioning. Declined any other movement despite maximal effort from PT/tech. Attempted using family as motivation as well but no effort.     Transfers  General transfer comment: Declined any mobility.     Ambulation / Gait / Stairs / Wheelchair Mobility  Ambulation/Gait General Gait Details: unable    Posture / Balance Dynamic Sitting Balance Sitting balance - Comments: max assist  Balance Overall balance assessment: No apparent balance deficits (not formally assessed)(as pt declining any effort or initiation of movement) Sitting-balance support: Single extremity supported, Feet supported Sitting balance-Leahy Scale: Poor Sitting balance - Comments: max assist    Postural control: Left lateral lean    Special needs/care consideration BiPAP/CPAP: No CPM: No Continuous Drip IV: No Dialysis: No         Life Vest: No Oxygen: No Special Bed: No Trach Size: No Wound Vac (area): No       Skin: Dry flaky skin                              Bowel mgmt: Incontinent, last BM 07/25/17 Bladder mgmt: Incontinent  Diabetic mgmt: Yes, and he managed with oral meds  Previous Home Environment Living Arrangements: Spouse/significant other, Children  Lives With: Spouse, Son Available Help at Discharge: Family Type of Home: House Home Care Services: No  Discharge Living Setting Plans for Discharge Living Setting: Patient's home, Lives with (comment)(spouse) Type of Home at Discharge: House Discharge Home Layout: Multi-level Alternate Level Stairs-Rails: None Alternate Level Stairs-Number of Steps: 3 steps down from kitchen into the rest of the house  Discharge Home Access: Ramped entrance(into kitchen ) Discharge Bathroom Shower/Tub: Tub/shower unit Discharge Bathroom Toilet: Handicapped height Discharge Bathroom Accessibility: Yes How Accessible: Accessible via walker Does the patient have any problems obtaining your medications?: No  Social/Family/Support Systems Patient Roles: Spouse, Parent, Other (Comment)(Grandparent ) Contact Information: Spouse: Dub Mikesnnie Kubly [phone] Anticipated Caregiver: SonRogelia Rohrer: [name] Heyde [phone] Anticipated Caregiver's Contact Information: see above Ability/Limitations of Caregiver: Wife unbale to physically assist patient  Caregiver Availability: 24/7 Discharge Plan Discussed with Primary Caregiver: Yes Is Caregiver In Agreement with Plan?: Yes Does Caregiver/Family have Issues with Lodging/Transportation while Pt is in Rehab?: No  Goals/Additional Needs Patient/Family Goal for Rehab: PT/OT/SLP: Min-Mod A Expected length of stay: 20-30 days  Cultural Considerations: None Dietary Needs: Dys.1  textures and thin liquids  Equipment Needs: TBD Special Service Needs: Discussed likely wheelchair level and need to ramp inside of house too Additional Information: Dsicussed with son and spouse  Pt/Family Agrees to Admission and willing to participate: Yes Program Orientation Provided & Reviewed with Pt/Caregiver Including Roles  & Responsibilities: Yes Additional Information Needs: see above  Information Needs to be Provided By: Team FYI  Barriers to Discharge: Home environment access/layout  Decrease burden of Care through IP rehab admission: No  Possible need for SNF placement upon discharge: Potentially  Patient Condition: This patient's medical and functional status has changed since the consult dated: 07/24/17 in which the Rehabilitation Physician determined and documented that the patient's condition is appropriate for intensive rehabilitative care in an inpatient rehabilitation facility. See "History of Present Illness" (above) for medical update. Functional changes are: Max A +2 bed mobility. Patient's medical and functional status update has been discussed with the Rehabilitation physician and patient remains appropriate for inpatient rehabilitation. Will admit to inpatient rehab today.  Preadmission Screen Completed By:  Fae PippinMelissa Quintara Bost, 07/28/2017 3:35 PM ______________________________________________________________________   Discussed status with Dr. Allena KatzPatel on 07/28/17 at 1600 and received telephone approval for admission today.  Admission Coordinator:  Fae PippinMelissa Cannen Dupras, time 1600/Date 07/28/17         Revision History

## 2017-07-29 NOTE — Evaluation (Signed)
Speech Language Pathology Assessment and Plan  Patient Details  Name: Noah Bush. MRN: [id number] Date of Birth: 05/26/1941  SLP Diagnosis: Dysarthria;Cognitive Impairments;Dysphagia  Rehab Potential: Fair ELOS: 21-28 days     Today's Date: 07/29/2017 SLP Individual Time: 1330-1430 SLP Individual Time Calculation (min): 60 min   Problem List:  Patient Active Problem List   Diagnosis Date Noted  . Acute blood loss anemia   . Benign essential HTN   . 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 Acute ischemic right MCA stroke ([place]) 07/28/2017  . Diabetes mellitus type 2 in obese ([employer])   . Stage 3 chronic kidney disease ([place])   . Morbid obesity ([place])   . Cerebral infarction ([place]) 07/27/2017  . Hyperlipidemia   . Essential hypertension   . 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 Asymmetry of cerebral ventricles, Acute, Acquired  07/23/2017  . Encounter for screening colonoscopy 03/12/2015   Past Medical History:  Past Medical History:  Diagnosis Date  . Alzheimer's dementia    Archie Endo 07/23/2017  . Cerebral infarction ([place]) 07/27/2017  . Cerebrovascular accident (CVA) ([place]) 07/23/2017  . CKD (chronic kidney disease), stage III ([place])    Archie Endo 07/23/2017  . Gout   . Hypertension   . Type II diabetes mellitus ([place])    Archie Endo 07/23/2017   Past Surgical History:  Past Surgical History:  Procedure Laterality Date  . COLONOSCOPY WITH PROPOFOL N/A 05/09/2015   Procedure: COLONOSCOPY WITH PROPOFOL;  Surgeon: Robert Bellow, MD;  Location[facility] ENDOSCOPY;  Service: Endoscopy;  Laterality: N/A;  . TIBIA FRACTURE SURGERY Left     Assessment / Plan / Recommendation Clinical Impression   Emmitte Surgeon Jr.is a 77 y.o.RH- male with history of HTN, Alzheimer dementia, CKD III, and T2DM who was admitted on 07/23/17 from MD office with reports of fall, difficulty walking, drooling, right gaze preference and facial droop. History taken from chart review. CTA/P  head revealed R-MCA territory infarct involving right temporal and parietal lobe with heavily calcified carotid bifurcations and moderate stenosis right M1 and distal R- MCA with luxury perfusion and no mismatch.    He was not felt to be candidate for thrombolysis due to large infarct and  Dr. Leonie Man recommended DAPT X 3 months.  Follow up MRI brain reviewed, showing right MCA infarct.  Per report, revealed large acute on subacute R-MCA territory infarct with petechial hemorrhage, regional right cerebrum mass effect without midline shift and moderate chronic SVD.  2 D echo showed EF 40-45% with mild diffuse hypokinesis-- possible sever hypokinesis basal mid inferior and inferoseptal myocardium with grade one diastolic dysfunction. Outpatient TEE/Loop recorder recommended after discharge.  Cognitive evaluation revealed severe cognitive communication impairment question baseline but able to follow basic commands with left visual neglect. He continues to have issues with lethargy requiring tactile cues for activity, has significant left oral motor weakness--started on dysphagia 1, thin liquids and severe left neglect with pusher tendency.  Therapy working on pre-gait activity and CIR recommended due to functional deficits. SLP evaluation completed on 07/29/2017 with the following results:  Pt presents with severe oral dysphagia characterized by left oral motor weakness leading to impaired containment and transit of boluses with anterior spillage of boluses and pt's secretions.  Pt also has impaired attention to boluses in the setting of profound cognitive dysfunction.  For now, dys 1 and thin liquids appears to be the safest diet for pt with full supervision for use of swallowing precautions.  Pt also presents  with severe cognitive impairments with left inattention, decreased sustained attention, poor intellectual awareness of deficits, decreased task initiation and sequencing and decreased orientation.  Pt also has  decreased speech intelligibility in the setting of oral motor weakness and cognitive impairment; however, will hold off on speech goals for now to allow focus on cognition and swallowing.   Given the deficits mentioned above, pt would benefit from skilled ST while inpatient in order to maximize functional independence and reduce burden of care prior to discharge.  Anticipate that pt will need 24/7 supervision at discharge in addition to [place] follow up at next level of care.       Skilled Therapeutic Interventions          Cognitive-linguistic and bedside swallow evaluation completed.  See above.  Pt needed max to total assist for basic, automatic tasks due to deficits mentioned above.  He also needed max to total assist to recognize and correct anterior loss of boluses.  RN reports that pt has had minimal PO intake.  Pt will likely need feeing assistance from staff.       SLP Assessment  Patient will need skilled Speech Lanaguage Pathology Services during CIR admission    Recommendations  SLP Diet Recommendations: Dysphagia 1 (Puree);Thin Medication Administration: Crushed with puree Supervision: Staff to assist with self feeding;Full supervision/cueing for compensatory strategies Compensations: Minimize environmental distractions;Slow rate;Small sips/bites;Lingual sweep for clearance of pocketing;Other (Comment) Postural Changes and/or Swallow Maneuvers: Seated upright 90 degrees Oral Care Recommendations: Oral care BID Recommendations for Other Services: Neuropsych consult Patient destination: [place] (SNF) Follow up Recommendations: Skilled Nursing facility Equipment Recommended: None recommended by SLP    SLP Frequency 3 to 5 out of 7 days   SLP Duration  SLP Intensity  SLP Treatment/Interventions 21-28 days   Minumum of 1-2 x/day, 30 to 90 minutes  Cognitive remediation/compensation;Cueing hierarchy;Dysphagia/aspiration precaution training;Functional  tasks;Patient/family education;Internal/external aids;Environmental controls    Pain Pain Assessment Pain Assessment: No/denies pain  Prior Functioning Cognitive/Linguistic Baseline: Baseline deficits Type of Home: House  Lives With: Spouse;Son Available Help at Discharge: Family Vocation: Retired  Function:  Eating Eating   Modified Consistency Diet: No Eating Assist Level: Helper feeds patient           Cognition Comprehension Comprehension assist level: Understands basic 50 - 74% of the time/ requires cueing 25 - 49% of the time  Expression   Expression assist level: Expresses basic 25 - 49% of the time/requires cueing 50 - 75% of the time. Uses single words/gestures.  Social Interaction Social Interaction assist level: Interacts appropriately 25 - 49% of time - Needs frequent redirection.  Problem Solving Problem solving assist level: Solves basic less than 25% of the time - needs direction nearly all the time or does not effectively solve problems and may need a restraint for safety  Memory Memory assist level: Recognizes or recalls less than 25% of the time/requires cueing greater than 75% of the time   Short Term Goals: Week 1: SLP Short Term Goal 1 (Week 1): Pt will visually scan to midline in 25% of opportunities during basic, familiar tasks with max assist multimodal cues SLP Short Term Goal 2 (Week 1): Pt will sustain his attention to basic, familiar tasks for 1 minute with max verbal cues for redirection.  SLP Short Term Goal 3 (Week 1): Pt will utilize external aids to reorient to place, date, and situation with max assist multimodal cues.  SLP Short Term Goal 4 (Week 1): Pt will  consume dys 1 textures and thin liquids with max cues for use of swallowing precautions and minimal overt s/s of aspiration.  Refer to Care Plan for Long Term Goals  Recommendations for other services: Neuropsych  Discharge Criteria: Patient will be discharged from SLP if patient  refuses treatment 3 consecutive times without medical reason, if treatment goals not met, if there is a change in medical status, if patient makes no progress towards goals or if patient is discharged from hospital.  The above assessment, treatment plan, treatment alternatives and goals were discussed and mutually agreed upon: No family available/patient unable  Emilio Math 07/29/2017, 3:53 PM

## 2017-07-29 NOTE — Progress Notes (Signed)
Physical Medicine and Rehabilitation Consult Reason for Consult:Decreased functional mobility Referring Physician: [facility]   HPI: Noah Bush. is a 77 y.o.right handed male with history of Alzheimer's disease, hypertension, diabetes mellitus,CKD stage III. Patient lives with spouse as well as adult aged children. Ambulated independently but needed assistance for ADLs. Presented 07/23/2017 with decreased functional mobility, left-sided weakness and dysarthric speech. Noted hyperkalemia 5.6, creatinine 1.77.CT of the head showed large subacute right posterior MCA infarction with mass effect and partial right ventricle effacement. CT angio with diffuse moderate severe intracranial atherosclerotic disease. Patient did not receive TPA.MRI large acute right MCA territory infarct with petechial hemorrhage. Regional right cerebral mass effect without midline shift.echocardiogram with ejection fraction of 45% grade 1 diastolic dysfunction.eurology consulted with workup presently ongoing.dysphagia #2 honey thick liquid diet.Physical therapy evaluation completed 07/24/2017 with recommendations of physical medicine rehabilitation consult.   Review of Systems  Unable to perform ROS: Acuity of condition       Past Medical History:  Diagnosis Date  . Alzheimer's dementia    Noah Bush 07/23/2017  . CKD (chronic kidney disease), stage III (HCC)    Noah Bush 07/23/2017  . Gout   . Hypertension   . Type II diabetes mellitus (HCC)    Noah Bush 07/23/2017        Past Surgical History:  Procedure Laterality Date  . COLONOSCOPY WITH PROPOFOL N/A 05/09/2015   Procedure: COLONOSCOPY WITH PROPOFOL;  Surgeon: [name], MD;  Location: [facility] ENDOSCOPY;  Service: Endoscopy;  Laterality: N/A;  . TIBIA FRACTURE SURGERY Left    Family History  Problem Relation Age of Onset  . Diabetes Paternal Grandfather    Social History:  reports that  has never smoked. he has never used smokeless tobacco. He  reports that he does not drink alcohol or use drugs. Allergies: No Known Allergies       Medications Prior to Admission  Medication Sig Dispense Refill  . polyethylene glycol powder (GLYCOLAX/MIRALAX) powder 255 grams one bottle for colonoscopy prep (Patient not taking: Reported on 06/29/2017) 255 g 0    Home: Home Living Family/patient expects to be discharged to:: Private residence Living Arrangements: Spouse/significant other, Other relatives Available Help at Discharge: Family Type of Home: House  Functional History: Prior Function Level of Independence: Needs assistance Gait / Transfers Assistance Needed: ambulatory Comments: Pt is a poor historian. No family at bedside. Per chart, pt lived at home with wife and adult children. Ambulated independently. Pt has h/o dementia. Unsure if assist was needed for ADLs.  Functional Status:  Mobility: Bed Mobility Overal bed mobility: Needs Assistance Bed Mobility: Supine to Sit, Sit to Supine Supine to sit: Max assist, HOB elevated Sit to supine: +2 for physical assistance, Max assist General bed mobility comments: multi modal cues for sequencing, use of bed pad to scoot to EOB Transfers General transfer comment: unable Ambulation/Gait General Gait Details: unable  ADL:  Cognition: Cognition Overall Cognitive Status: No family/caregiver present to determine baseline cognitive functioning Orientation Level: Oriented to person Cognition Arousal/Alertness: Awake/alert Behavior During Therapy: WFL for tasks assessed/performed Overall Cognitive Status: No family/caregiver present to determine baseline cognitive functioning General Comments: Oriented to self and place. Answers questions appropriately. Follows simple commands inconsistently. Able to have conversation about the [facility] basketball game.   Blood pressure 129/72, pulse 67, temperature 98.4 F (36.9 C), temperature source Oral, resp. rate 18, height 5\' 11"  (1.803 m),  weight 124.3 kg (274 lb), SpO2 98 %. Physical Exam  Vitals reviewed. Constitutional:  Obese male  HENT:  Left facial droop  Eyes:  Pupils sluggish to light  Neck: Normal range of motion. Neck supple. No thyromegaly present.  Cardiovascular: Normal rate, regular rhythm and normal heart sounds.  Respiratory: Effort normal and breath sounds normal.  GI: Soft. Bowel sounds are normal. He exhibits no distension.  Neurological:  Sedated had just received ativan today prior to MRI. Patient is arousable would easily fall back asleep. Very limited to follow commands.Exam limited due to sedation. Initiated some movement of right side with pain provocation. Seemed to sense pain stim on left but did not move left side  Skin: Skin is warm and dry.  Assessment/Plan: Diagnosis: Large right MCA infarct 07/23/17 1. Does the need for close, 24 hr/day medical supervision in concert with the patient's rehab needs make it unreasonable for this patient to be served in a less intensive setting? Yes and Potentially 2. Co-Morbidities requiring supervision/potential complications: CKD, DM2, Alzheimer's Dementia 3. Due to bladder management, bowel management, safety, skin/wound care, disease management, medication administration and patient education, does the patient require 24 hr/day rehab nursing? Yes and Potentially 4. Does the patient require coordinated care of a physician, rehab nurse, PT (1-2 hrs/day, 5 days/week), OT (1-2 hrs/day, 5 days/week) and SLP (1-2 hrs/day, 5 days/week) to address physical and functional deficits in the context of the above medical diagnosis(es)? Potentially Addressing deficits in the following areas: balance, endurance, locomotion, strength, transferring, bowel/bladder control, bathing, dressing, feeding, grooming, toileting, cognition, speech, language, swallowing and psychosocial support 5. Can the patient actively participate in an intensive therapy program of at least 3 hrs of  therapy per day at least 5 days per week? Potentially 6. The potential for patient to make measurable gains while on inpatient rehab is good and fair 7. Anticipated functional outcomes upon discharge from inpatient rehab are min assist and mod assist  with PT, min assist and mod assist with OT, supervision, min assist and mod assist with SLP. 8. Estimated rehab length of stay to reach the above functional goals is: potentially 20-30 days 9. Anticipated D/C setting: Home 10. Anticipated post D/C treatments: HH therapy and Outpatient therapy 11. Overall Rehab/Functional Prognosis: good and fair  RECOMMENDATIONS: This patient's condition is appropriate for continued rehabilitative care in the following setting: see below Patient has agreed to participate in recommended program. N/A Note that insurance prior authorization may be required for reimbursement for recommended care.  Comment: Pt suffered large stroke yesterday with associated edema. Will follow along for neurological and functional improvements. If he demonstrates increased capacity to participate with therapy, we can explore an inpatient rehab admission. Thanks  Ranelle OysterZachary T. Swartz, MD, [facility]AP[facility] & Rehabilitation 07/24/2017   Mcarthur Rossettianiel J Angiulli, PA-C 07/24/2017          Revision History                        Routing History

## 2017-07-29 NOTE — Progress Notes (Signed)
Social Work Assessment and Plan  Patient Details  Name: Noah Bush. MRN: [id number] Date of Birth: 09-19-1940  Today's Date: 07/29/2017  Problem List:  Patient Active Problem List   Diagnosis Date Noted  . Acute blood loss anemia   . Benign essential HTN   . 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 Acute ischemic right MCA stroke ([place]) 07/28/2017  . Diabetes mellitus type 2 in obese ([place])   . Stage 3 chronic kidney disease ([place])   . Morbid obesity ([place])   . Cerebral infarction ([place]) 07/27/2017  . Hyperlipidemia   . Essential hypertension   . 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 Asymmetry of cerebral ventricles, Acute, Acquired  07/23/2017  . Encounter for screening colonoscopy 03/12/2015   Past Medical History:  Past Medical History:  Diagnosis Date  . Alzheimer's dementia    Archie Endo 07/23/2017  . Cerebral infarction ([place]) 07/27/2017  . Cerebrovascular accident (CVA) ([place]) 07/23/2017  . CKD (chronic kidney disease), stage III ([place])    Archie Endo 07/23/2017  . Gout   . Hypertension   . Type II diabetes mellitus ([place])    Archie Endo 07/23/2017   Past Surgical History:  Past Surgical History:  Procedure Laterality Date  . COLONOSCOPY WITH PROPOFOL N/A 05/09/2015   Procedure: COLONOSCOPY WITH PROPOFOL;  Surgeon: Robert Bellow, MD;  Location: [facility] ENDOSCOPY;  Service: Endoscopy;  Laterality: N/A;  . TIBIA FRACTURE SURGERY Left    Social History:  reports that  has never smoked. he has never used smokeless tobacco. He reports that he drinks about 1.2 oz of alcohol per week. He reports that he does not use drugs.  Family / Support Systems Marital Status: Married Patient Roles: Spouse, Parent, Other (Comment)(grandparent) Spouse/Significant Other: Ibrohim Simmers - wife - [phone] Children: Son: Jajuan Skoog [phone] Other Supports: grandson; extended family Anticipated Caregiver: son and grandson; wife to provide  supervision/meals Ability/Limitations of Caregiver: Wife unable to physically assist patient, but son and grandson will be able to provide physical care. Caregiver Availability: 24/7 Family Dynamics: supportive family  Social History Preferred language: English Religion: None Read: Yes Write: Yes Employment Status: Retired Public relations account executive Issues: none reported Guardian/Conservator: MD has determined that pt is not capable of making his own decisions - wife would be next of kin.   Abuse/Neglect Abuse/Neglect Assessment Can Be Completed: Yes Physical Abuse: Denies Verbal Abuse: Denies Sexual Abuse: Denies Exploitation of patient/patient's resources: Denies Self-Neglect: Denies  Emotional Status Pt's affect, behavior and adjustment status: Pt was smiling and joking around with son and other family present.  Family reports he is in good spirits, still making jokes as he usually does. Recent Psychosocial Issues: Pt with alzheimer's dementia. Psychiatric History: none reported Substance Abuse History: none reported  Patient / Family Perceptions, Expectations & Goals Pt/Family understanding of illness & functional limitations: Pt's son has a good understanding of pt's condition, but not fully of limitations, asking PT to walk him, although PT does not feel he is quite ready for that.  Pt did not discuss his condition with CSW. Premorbid pt/family roles/activities: Pt goes out daily, goes to car wash to visit friends, spends time with family. Anticipated changes in roles/activities/participation: Pt was driving before the stroke, but will not be able to, but will hopefully be able to resume other activities. Pt/family expectations/goals: Family wants pt to improve to a place where they can care for him at home.  Community [employer] Agencies: None Premorbid Home Care/DME Agencies: None -  pt does have ramped entrance at home,but may need ramp inside home as there are 3  steps inside from the kitchen to access the rest of the home. 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 Patient/Family Preliminary Plans: Pt to return to his home with his wife, son, and grandson to assist with pt's care. Social Work Anticipated Follow Up Needs: HH/OP, Support Group Expected length of stay: 20-30 days   Clinical Impression CSW met with pt, pt's son, and a couple of pt's friends to introduce self and role of CSW, as well as to complete assessment.  Son was already helping pt with his showers, but he and his son (pt's grandson) will be with pt 24/7.  Pt's son has some nursing training and grandson works at [employer].  They are both physically able to provide care to pt.  Pt was in good spirits during visit and son has not seen any signs of depression or anxiety in pt.  No current concerns/questions/needs at this point, but CSW will continue to follow and assist as needed.  Damaso Laday, Silvestre Mesi 07/29/2017, 2:58 PM

## 2017-07-29 NOTE — Progress Notes (Addendum)
Physical Therapy Assessment and Plan  Patient Details  Name: Noah Bush. MRN: [id number] Date of Birth: [birth year]  PT Diagnosis: Abnormal posture, Cognitive deficits, Difficulty walking, Hemiparesis non-dominant, Hypertonia, Impaired cognition and Muscle weakness Rehab Potential: Fair ELOS: 26-28 days   Today's Date: 07/29/2017 PT Individual Time: 0930-1030 PT Individual Time Calculation (min): 60 min    Problem List:  Patient Active Problem List   Diagnosis Date Noted  . Acute blood loss anemia   . Benign essential HTN   . 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 Acute ischemic right MCA stroke ([place]) 07/28/2017  . Diabetes mellitus type 2 in obese ([place])   . Stage 3 chronic kidney disease ([place])   . Morbid obesity ([place])   . Cerebral infarction ([place]) 07/27/2017  . Hyperlipidemia   . Essential hypertension   . 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 Asymmetry of cerebral ventricles, Acute, Acquired  07/23/2017  . Encounter for screening colonoscopy 03/12/2015    Past Medical History:  Past Medical History:  Diagnosis Date  . Alzheimer's dementia    Noah Bush 07/23/2017  . Cerebral infarction ([place]) 07/27/2017  . Cerebrovascular accident (CVA) ([place]) 07/23/2017  . CKD (chronic kidney disease), stage III ([place])    Noah Bush 07/23/2017  . Gout   . Hypertension   . Type II diabetes mellitus ([place])    Noah Bush 07/23/2017   Past Surgical History:  Past Surgical History:  Procedure Laterality Date  . COLONOSCOPY WITH PROPOFOL N/A 05/09/2015   Procedure: COLONOSCOPY WITH PROPOFOL;  Surgeon: Noah Bellow, MD;  Location: [facility] ENDOSCOPY;  Service: Endoscopy;  Laterality: N/A;  . TIBIA FRACTURE SURGERY Left     Assessment & Plan Clinical Impression: Noah Bushis a 77 y.o.RH- male with history of HTN, Alzheimer dementia, CKD III, and T2DM who was admitted on 07/23/17 from MD office with reports of fall, difficulty walking, drooling, right gaze  preference and facial droop. History taken from chart review. CTA/P head revealed R-MCA territory infarct involving right temporal and parietal lobe with heavily calcified carotid bifurcations and moderate stenosis right M1 and distal R- MCA with luxury perfusion and no mismatch.    He was not felt to be candidate for thrombolysis due to large infarct and  Dr. Leonie Man recommended DAPT X 3 months.   Follow up MRI brain reviewed, showing right MCA infarct.  Per report, revealed large acute on subacute R-MCA territory infarct with petechial hemorrhage, regional right cerebrum mass effect without midline shift and moderate chronic SVD.  2 D echo showed EF 40-45% with mild diffuse hypokinesis-- possible sever hypokinesis basal mid inferior and inferoseptal myocardium with grade one diastolic dysfunction. Outpatient TEE/Loop recorder recommended after discharge.  Cognitive evaluation revealed severe cognitive communication impairment question baseline but able to follow basic commands with left visual neglect. He continues to have issues with lethargy requiring tactile cues for activity, has significant left oral motor weakness--started on dysphagia 1, thin liquids and severe left neglect with pusher tendency.  Therapy working on pre-gait activity and CIR recommended due to functional deficits.  Patient transferred to CIR on 07/28/2017 .   Patient currently requires total with mobility secondary to muscle weakness, impaired timing and sequencing, abnormal tone, unbalanced muscle activation, motor apraxia, decreased coordination and decreased motor planning, decreased midline orientation, decreased attention to left and decreased motor planning, decreased initiation, decreased attention, decreased problem solving and delayed processing and decreased sitting balance, decreased postural control, hemiplegia and decreased balance strategies.  Prior to hospitalization,  patient was independent  with mobility and lived with  Spouse, Son in a House home.  Home access is  Ramped entrance.  Patient will benefit from skilled PT intervention to maximize safe functional mobility, minimize fall risk and decrease caregiver burden for planned discharge home with 24 hour assist.  Anticipate patient will benefit from follow up [facility] at discharge.  PT - End of Session Activity Tolerance: Tolerates < 10 min activity, no significant change in vital signs Endurance Deficit: Yes PT Assessment Rehab Potential (ACUTE/IP ONLY): Fair PT Barriers to Discharge: Home environment access/layout PT Patient demonstrates impairments in the following area(s): Balance;Endurance;Motor;Perception PT Transfers Functional Problem(s): Bed Mobility;Bed to Chair;Car;Furniture PT Locomotion Functional Problem(s): Wheelchair Mobility;Ambulation;Stairs PT Plan PT Intensity: Minimum of 1-2 x/day ,45 to 90 minutes PT Frequency: 5 out of 7 days PT Duration Estimated Length of Stay: 26-28 days PT Treatment/Interventions: Ambulation/gait training;DME/adaptive equipment instruction;Neuromuscular re-education;Stair training;UE/LE Strength taining/ROM;Wheelchair propulsion/positioning;Discharge planning;Functional electrical stimulation;Balance/vestibular training;Therapeutic Activities;UE/LE Coordination activities;Cognitive remediation/compensation;Functional mobility training;Patient/family education;Splinting/orthotics;Therapeutic Exercise;Visual/perceptual remediation/compensation PT Transfers Anticipated Outcome(s): modA slideboard/possibly squat pivot PT Locomotion Anticipated Outcome(s): W/C level modA PT Recommendation Follow Up Recommendations: Home health PT;Other (comment)(possibly SNF per pt progress) Patient destination: Home Equipment Recommended: To be determined  Skilled Therapeutic Intervention Pt greeted supine in bed, handed off from nursing staff, son and family friend present, pt lethargic but responsive to questioning and agreeable to  evaluation. PT presents with R gaze preference with head turned to R, B eyes unable to track to midline, pt able to move eyes to midline with head positioning, able to move head to midline and maintains position for 1-2 seconds. Son was present throughout session and attempts to be helpful however is unfamiliar with pt diagnosis and deficits, requires ongoing education on forcing pt to look to L by setting up room so that TV and visitors are on L, decreasing cuing and allowing pt time to process commands. Pt demonstrates decreased command following with BLE AROM in supine, increased voluntary movement to assist therapist in threading pant legs, L knee limited to 40 degrees flexion premorbidly. Pt rolls R/L with total A +2 to don pants, transitions supine to sit with totalA +2, pt raises BUE for therapist to assist with threading shirt, maintains static sitting balance with modA for dynamic balance.  Performs squat pivot transfer with totalA bed>TIS, dependently transported to therapy gym hallway. Attempted sit<>stand from TIS using hallway rail and therapist under L trunk with maximal verbal, visual, and tactile cues for anterior weight shift with pt unable to complete. Pt with moderate pushing to L. At end of session, pt returned to room in [place], son and friend present, denies any needs at this time.  PT Evaluation Precautions/Restrictions Precautions Precautions: Fall Precaution Comments: severe left neglect Restrictions Weight Bearing Restrictions: No General Chart Reviewed: Yes Additional Pertinent History: L knee flexion limited to 40degrees frm previous injury Response to Previous Treatment: Not applicable Family/Caregiver Present: Yes  Pain Pain Assessment Pain Score: 0-No pain Home Living/Prior Functioning Home Living Living Arrangements: Spouse/significant other;Children Available Help at Discharge: Family Type of Home: House Home Access: Ramped entrance Home Layout: Other (Comment)   Lives With: Spouse;Son Prior Function Level of Independence: Independent with basic ADLs;Independent with homemaking with ambulation;Independent with gait  Able to Take Stairs?: Yes Driving: Yes Vocation: Retired Comments: pt is a poor historian, son present at bedside and able to answer most questions Vision/Perception  Vision - Assessment Eye Alignment: Impaired (comment) Ocular Range of Motion: Restricted on the left Alignment/Gaze Preference:  Gaze right;Head turned Tracking/Visual Pursuits: Right eye does not track medially;Left eye does not track laterally;Decreased smoothness of horizontal tracking;Decreased smoothness of vertical tracking Saccades: Impaired - to be further tested in functional context Convergence: Impaired (comment) Perception Perception: Impaired Inattention/Neglect: Does not attend to left visual field Praxis Praxis: Impaired Praxis Impairment Details: Initiation;Ideomotor;Motor planning  Cognition Overall Cognitive Status: Impaired/Different from baseline Arousal/Alertness: Awake/alert Orientation Level: Oriented to person oriented to year  Attention: Sustained Focused Attention: Appears intact Sustained Attention: Impaired Sustained Attention Impairment: Verbal basic;Functional basic Memory: Impaired Memory Impairment: Storage deficit Awareness: Impaired Awareness Impairment: Intellectual impairment Problem Solving: Impaired Problem Solving Impairment: Functional basic;Verbal basic Executive Function: (all impaired due to lower level deficits) Safety/Judgment: Impaired Comments: severe left inattention Sensation Sensation Light Touch: Impaired Detail Light Touch Impaired Details: Impaired LUE;Impaired LLE Proprioception: Impaired Detail Proprioception Impaired Details: Impaired LUE;Impaired LLE Coordination Gross Motor Movements are Fluid and Coordinated: No Fine Motor Movements are Fluid and Coordinated: No Coordination and Movement  Description: gross movements slow and deliberate  Motor  Motor Motor: Motor apraxia;Hemiplegia;Abnormal postural alignment and control Motor - Skilled Clinical Observations: L sided hemiparesis with motor planning deficits, increased tone LLE   Mobility Bed Mobility Bed Mobility: Rolling Right;Rolling Left Rolling Right: 1: +2 Total assist Rolling Right: Patient Percentage: 10% Rolling Right Details: Manual facilitation for weight shifting;Manual facilitation for placement;Manual facilitation for weight bearing;Verbal cues for technique Rolling Left: 1: +2 Total assist Rolling Left: Patient Percentage: 10% Rolling Left Details: Verbal cues for technique;Manual facilitation for weight shifting;Manual facilitation for placement;Manual facilitation for weight bearing Transfers Transfers: Yes Squat Pivot Transfers: 1: +2 Total assist Squat Pivot Transfer Details: Manual facilitation for weight shifting;Manual facilitation for placement;Manual facilitation for weight bearing;Verbal cues for sequencing;Verbal cues for precautions/safety Locomotion  Ambulation Ambulation: No Gait Gait: No Stairs / Additional Locomotion Stairs: No Wheelchair Mobility Wheelchair Mobility: Yes Wheelchair Assistance: 1: +1 Total assist  Trunk/Postural Assessment  Cervical Assessment Cervical Assessment: Exceptions to [facility] Cervical AROM Overall Cervical AROM: Due to impaired cognition;Deficits;Other (comment) Thoracic Assessment Thoracic Assessment: Exceptions to [facility] Lumbar Assessment Lumbar Assessment: Exceptions to [facility] Postural Control Postural Control: Deficits on evaluation Head Control: unable to maintain head in midline  Trunk Control: able to sit unassisted EOM approximately ~5 min  Balance Balance Balance Assessed: Yes Static Sitting Balance Static Sitting - Balance Support: No upper extremity supported Static Sitting - Level of Assistance: 5: Stand by assistance Dynamic Sitting  Balance Sitting balance - Comments: min to mod A with multimodal cues for posture and directions Static Standing Balance Static Standing - Comment/# of Minutes: stood 3 x in [place] with external multimodal cues for posture at midline Extremity Assessment  RUE Assessment RUE Assessment: Within Functional Limits LUE Assessment LUE Assessment: Exceptions to [facility] LUE Strength LUE Overall Strength: Deficits LUE Tone LUE Tone: Within Functional Limits LUE Tone Comments: Brunstrom III overall weak; unable to grasp at this time but can move middle index RLE Assessment RLE Assessment: Exceptions to [facility] RLE Strength RLE Overall Strength: Deficits(decreased command following, knee flexion supine 3-/5 ) LLE Assessment LLE Assessment: Exceptions to [facility] LLE Strength LLE Overall Strength: Deficits(decreased command following, knee flexion 2-/5 ) See Function Navigator for Current Functional Status.   Refer to Care Plan for Long Term Goals  Recommendations for other services: None   Discharge Criteria: Patient will be discharged from PT if patient refuses treatment 3 consecutive times without medical reason, if treatment goals not met, if there is a change in medical status, if  patient makes no progress towards goals or if patient is discharged from hospital.  The above assessment, treatment plan, treatment alternatives and goals were discussed and mutually agreed upon: by patient and by family  Salvatore Decent 07/29/2017, 12:37 PM

## 2017-07-29 NOTE — Progress Notes (Signed)
[place] PHYSICAL MEDICINE & REHABILITATION     PROGRESS NOTE  Subjective/Complaints:  Patient seen lying in bed this morning. He states he slept well overnight. He is ready to begin therapies.  ROS: Denies CP, SOB, nausea, vomiting, diarrhea.  Objective: Vital Signs: Blood pressure (!) 149/74, pulse 78, temperature 98.4 F (36.9 C), temperature source Oral, resp. rate 19, height 5\' 11"  (1.803 m), weight 122.9 kg (270 lb 15.1 oz), SpO2 94 %. No results found. No results for input(s): WBC, HGB, HCT, PLT in the last 72 hours. Recent Labs    07/27/17 0352 07/28/17 0531  NA 142 143  K 4.7 5.1  CL 115* 115*  GLUCOSE 114* 117*  BUN 18 19  CREATININE 1.36* 1.39*  CALCIUM 9.1 9.0   CBG (last 3)  Recent Labs    07/28/17 0734 07/28/17 1126 07/28/17 1553  GLUCAP 104* 122* 82    Wt Readings from Last 3 Encounters:  07/28/17 122.9 kg (270 lb 15.1 oz)  07/23/17 124.3 kg (274 lb)  06/29/17 124.3 kg (274 lb)    Physical Exam:  BP (!) 149/74 (BP Location: Right Wrist)   Pulse 78   Temp 98.4 F (36.9 C) (Oral)   Resp 19   Ht 5\' 11"  (1.803 m)   Wt 122.9 kg (270 lb 15.1 oz)   SpO2 94%   BMI 37.79 kg/m  Constitutional: He appears well-developed. He is sleeping and cooperative. Obese  HENT: Normocephalic and atraumatic.  Eyes: No discharge. Dysconjugate gaze with right eye fixed to right lateral field. Right gaze preference  Neck: Keeps head fixed to right   Cardiovascular: Normal rate and regular rhythm. No JVD.  Respiratory: Effort normal and breath sounds normal.  GI: Bowel sounds are normal. He exhibits no distension.  Musculoskeletal: He exhibits min edema right hand and left knees.   Neurological: He is alert.  A&Ox3 with significant delay in processing Left facial weakness with moderate to severe dysarthria. Wet voice but able to clear oral secretions.   Able to follow simple motor commands with max cues.  Motor: RUE 3+/5 proximal to distal with ?tone RLE 2+/5  proximal to distal LUE: 2+/5 proximal to distal LLE: 3-/5 proximal to distal  Skin: Skin is warm and dry.  Well healed old incisions left knee with decreased ROM.   Psychiatric: His affect is blunt. His speech is delayed and slurred. He is slowed. Cognition and memory are impaired. He expresses inappropriate judgment.    Assessment/Plan: 1. 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 Physiatrist is providing close team supervision and 24 hour management of active medical problems listed below. Physiatrist and rehab team continue to assess barriers to discharge/monitor patient progress toward functional and medical goals.  Function:  Bathing Bathing position      Bathing parts      Bathing assist        Upper Body Dressing/Undressing Upper body dressing                    Upper body assist        Lower Body Dressing/Undressing Lower body dressing                                  Lower body assist  Toileting Toileting          Toileting assist     Transfers Chair/bed Company secretary Comprehension    Expression    Social Interaction    Problem Solving    Memory      Medical Problem List and Plan: 1.  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   Begin CIR 2.  DVT Prophylaxis/Anticoagulation: start  Pharmaceutical: Lovenox 3. Pain Management: tylenol prn. Added Sportscreme to help with joint stiffness bilateral hands and left knee.  4. Mood: Team to provide encouragement/ego support. LCSW to follow for evaluation when appropriate.  5. Alzheimer's dementia/  Neuropsych: This patient is not capable of making decisions on his own behalf. Continue Namenda   6. Skin/Wound Care: Pressure relief measures. Maintain adequate nutritional and hydration status.  7. Fluids/Electrolytes/Nutrition: Has been refusing intake--needs assistance/feeding for adequate intake. Added supplements (likes drinks cold)  to help with intake.  8. T2DM: Monitor BS ac/hs. Continue SSI and titrate as intake improves   Monitor with increased mobility   9. HTN: Monitor BP bid--avoid hypotension. Continue Metoprolol XL.    Monitor with increased mobility   10. CKD stage III: Monitor renal status. Push fluid intake.    Cr 1.39 on 2/26   Cont to monitor 11. Dyslipidemia: now on Lipitor.   12. Morbid obesity             Body mass index is 38.22 kg/m.             Diet and exercise education             Encourage weight loss to increase endurance and promote overall health 13. Acute blood loss anemia   Hemoglobin 12.5 on 2/23   Labs pending   LOS (Days) 1 A FACE TO FACE EVALUATION WAS PERFORMED  Ankit Karis Juba 07/29/2017 8:38 AM

## 2017-07-29 NOTE — Progress Notes (Signed)
Patient information reviewed and entered into eRehab system by Areya Lemmerman, RN, CRRN, PPS Coordinator.  Information including medical coding and functional independence measure will be reviewed and updated through discharge.     Per nursing patient was given "Data Collection Information Summary for Patients in Inpatient Rehabilitation Facilities with attached "Privacy Act Statement-Health Care Records" upon admission.  

## 2017-07-29 NOTE — Evaluation (Signed)
Occupational Therapy Assessment and Plan  Patient Details  Name: Noah Bush. MRN: [id number] Date of Birth: 09/07/1940  OT Diagnosis: abnormal posture, apraxia, cognitive deficits, hemiplegia affecting non-dominant side and muscle weakness (generalized) Rehab Potential: Rehab Potential (ACUTE ONLY): Fair ELOS: ~25-28 days   Today's Date: 07/29/2017 OT Individual Time: 1045-1200 OT Individual Time Calculation (min): 75 min     Problem List:  Patient Active Problem List   Diagnosis Date Noted  . Acute blood loss anemia   . Benign essential HTN   . 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 Acute ischemic right MCA stroke ([place]) 07/28/2017  . Diabetes mellitus type 2 in obese ([place])   . Stage 3 chronic kidney disease ([place])   . Morbid obesity ([place])   . Cerebral infarction ([place]) 07/27/2017  . Hyperlipidemia   . Essential hypertension   . 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 Asymmetry of cerebral ventricles, Acute, Acquired  07/23/2017  . Encounter for screening colonoscopy 03/12/2015    Past Medical History:  Past Medical History:  Diagnosis Date  . Alzheimer's dementia    Archie Endo 07/23/2017  . Cerebral infarction ([place]) 07/27/2017  . Cerebrovascular accident (CVA) ([place]) 07/23/2017  . CKD (chronic kidney disease), stage III ([place])    Archie Endo 07/23/2017  . Gout   . Hypertension   . Type II diabetes mellitus ([place])    Archie Endo 07/23/2017   Past Surgical History:  Past Surgical History:  Procedure Laterality Date  . COLONOSCOPY WITH PROPOFOL N/A 05/09/2015   Procedure: COLONOSCOPY WITH PROPOFOL;  Surgeon: Robert Bellow, MD;  Location: [facility] ENDOSCOPY;  Service: Endoscopy;  Laterality: N/A;  . TIBIA FRACTURE SURGERY Left     Assessment & Plan Clinical Impression: Patient is a 77 y.o. year old male RH- male with history of HTN, Alzheimer dementia, CKD III, and T2DM who was admitted on 07/23/17 from MD office with reports of fall, difficulty walking,  drooling, right gaze preference and facial droop. History taken from chart review. CTA/P head revealed R-MCA territory infarct involving right temporal and parietal lobe with heavily calcified carotid bifurcations and moderate stenosis right M1 and distal R- MCA with luxury perfusion and no mismatch.    He was not felt to be candidate for thrombolysis due to large infarct and  Dr. Leonie Man recommended DAPT X 3 months.   Follow up MRI brain reviewed, showing right MCA infarct.  Per report, revealed large acute on subacute R-MCA territory infarct with petechial hemorrhage, regional right cerebrum mass effect without midline shift and moderate chronic SVD.  2 D echo showed EF 40-45% with mild diffuse hypokinesis-- possible sever hypokinesis basal mid inferior and inferoseptal myocardium with grade one diastolic dysfunction. Outpatient TEE/Loop recorder recommended after discharge.  Cognitive evaluation revealed severe cognitive communication impairment question baseline but able to follow basic commands with left visual neglect. He continues to have issues with lethargy requiring tactile cues for activity, has significant left oral motor weakness--started on dysphagia 1, thin liquids and severe left neglect with pusher tendency.  Patient transferred to CIR on 07/28/2017 .    Patient currently requires total A +2  with basic self-care skills and and basic transfers secondary to muscle weakness, decreased cardiorespiratoy endurance, impaired timing and sequencing, unbalanced muscle activation, motor apraxia, decreased coordination and decreased motor planning, decreased visual acuity, decreased visual perceptual skills and decreased visual motor skills, decreased midline orientation, decreased attention to left, left side neglect and decreased motor planning, decreased initiation, decreased attention, decreased awareness,  decreased problem solving, decreased safety awareness, decreased memory and delayed processing  and decreased sitting balance, decreased standing balance, decreased postural control, hemiplegia, decreased balance strategies and difficulty maintaining precautions.  Prior to hospitalization, patient could complete ADL with supervision.  Patient will benefit from skilled intervention to decrease level of assist with basic self-care skills and increase independence with basic self-care skills prior to discharge home with care partner.  Anticipate patient will require 24 hour supervision and follow up home health.  OT - End of Session Activity Tolerance: Tolerates 10 - 20 min activity with multiple rests Endurance Deficit: Yes OT Assessment Rehab Potential (ACUTE ONLY): Fair OT Patient demonstrates impairments in the following area(s): Balance;Perception;Behavior;Safety;Cognition;Sensory;Edema;Skin Integrity;Motor;Nutrition;Endurance;Vision OT Basic ADL's Functional Problem(s): Grooming;Bathing;Dressing;Toileting;Eating OT Transfers Functional Problem(s): Toilet;Tub/Shower OT Additional Impairment(s): Fuctional Use of Upper Extremity OT Plan OT Intensity: Minimum of 1-2 x/day, 45 to 90 minutes OT Frequency: 5 out of 7 days OT Duration/Estimated Length of Stay: ~25-28 days OT Treatment/Interventions: Balance/vestibular training;Discharge planning;Functional electrical stimulation;Pain management;Self Care/advanced ADL retraining;Therapeutic Activities;UE/LE Coordination activities;Visual/perceptual remediation/compensation;Therapeutic Exercise;Skin care/wound managment;Patient/family education;Functional mobility training;Disease mangement/prevention;Cognitive remediation/compensation;Community reintegration;DME/adaptive equipment instruction;Neuromuscular re-education;Psychosocial support;Splinting/orthotics;UE/LE Strength taining/ROM;Wheelchair propulsion/positioning OT Self Feeding Anticipated Outcome(s): setup  OT Basic Self-Care Anticipated Outcome(s): mod A overall OT Toileting Anticipated  Outcome(s): mod A  OT Bathroom Transfers Anticipated Outcome(s): mod A  OT Recommendation Recommendations for Other Services: Neuropsych consult Patient destination: Home Follow Up Recommendations: 24 hour supervision/assistance;Home health OT Equipment Recommended: To be determined   Skilled Therapeutic Intervention Ot eval initiated with OT goals, purpose and role discussed. Pt already dressed and in the w/c when arrived. Focus on functional mobility including standing at midline and transfer training. Pt with significant posturing to the right with difficulty tracking to midline and maintaining head positioning at midline.  In [place] pt able to come into standing position and obtain midline with max trunk support and multimodal cues for pelvis at neural. Pt also able to weight bear through both LEs- however pt with decr left knee flexion to place underneath him prior to standing. Mirror was not effective in this session due to inability to visual find mirror (even on his right). Performed slide board transfer to the mat toward his left side with +2 max A Pt able to maintain sitting balance EOM with min guard. Pt with shortening in trunk on left and elongation on right- placed wedge under right hip. Pt did demonstrate initiation with transfer with slide board to the right back into the w/c- challenged by his left Le not being underneath him. Pt's room rearranged to promote attention to left.  Left up in the tilt in space w/c.    OT Evaluation Precautions/Restrictions  Precautions Precautions: Fall Precaution Comments: severe left neglect Restrictions Weight Bearing Restrictions: No General Chart Reviewed: Yes Family/Caregiver Present: Yes(son and friend) Vital Signs  Pain Pain Assessment Pain Assessment: No/denies pain Pain Score: 0-No pain Home Living/Prior Functioning Home Living Family/patient expects to be discharged to:: Private residence Living Arrangements: Spouse/significant  other Available Help at Discharge: Family Type of Home: House Home Access: Ramped entrance Home Layout: Other (Comment)  Lives With: Son, Spouse Prior Function Level of Independence: Independent with basic ADLs, Independent with homemaking with ambulation, Independent with gait  Able to Take Stairs?: Yes Driving: Yes Vocation: Retired Comments: pt is a poor historian, son present at bedside and able to answer most questions ADL ADL ADL Comments: see functional navigator Vision Baseline Vision/History: Wears glasses Wears Glasses: At all times Vision Assessment?: Yes Eye Alignment:  Impaired (comment) Ocular Range of Motion: Restricted on the left Alignment/Gaze Preference: Gaze right;Head turned Tracking/Visual Pursuits: Right eye does not track medially;Left eye does not track laterally;Decreased smoothness of horizontal tracking;Decreased smoothness of vertical tracking Saccades: Impaired - to be further tested in functional context Convergence: Impaired (comment) Visual Fields: Left visual field deficit Perception  Perception: Impaired Inattention/Neglect: Does not attend to left visual field Praxis Praxis: Impaired Praxis Impairment Details: Initiation;Ideomotor;Motor planning Cognition Overall Cognitive Status: Impaired/Different from baseline Arousal/Alertness: Awake/alert Orientation Level: Person;Place;Situation Person: Oriented Place: Disoriented Situation: Disoriented Year: 2019 Month: (unknown) Day of Week: Incorrect Memory: Impaired Memory Impairment: Storage deficit;Decreased recall of new information;Decreased short term memory Immediate Memory Recall: (0/3) Memory Recall: (0/3) Attention: Focused;Sustained Focused Attention: Appears intact Sustained Attention: Impaired Sustained Attention Impairment: Verbal basic;Functional basic Awareness: Impaired Awareness Impairment: Intellectual impairment Problem Solving: Impaired Problem Solving Impairment:  Functional basic Executive Function: (all impaired) Safety/Judgment: Impaired Sensation Sensation Light Touch: Impaired Detail Light Touch Impaired Details: Impaired LUE;Impaired LLE Proprioception: Impaired Detail Proprioception Impaired Details: Impaired LUE;Impaired LLE Coordination Gross Motor Movements are Fluid and Coordinated: No Fine Motor Movements are Fluid and Coordinated: No Coordination and Movement Description: gross movements slow and deliberate  Motor  Motor Motor: Motor apraxia;Hemiplegia;Abnormal postural alignment and control Motor - Skilled Clinical Observations: L sided hemiparesis with motor planning deficits, increased tone LLE  Mobility  Bed Mobility Bed Mobility: Rolling Right;Rolling Left Rolling Right: 1: +2 Total assist Rolling Right: Patient Percentage: 10% Rolling Right Details: Manual facilitation for weight shifting;Manual facilitation for placement;Manual facilitation for weight bearing;Verbal cues for technique Rolling Left: 1: +2 Total assist Rolling Left: Patient Percentage: 10% Rolling Left Details: Verbal cues for technique;Manual facilitation for weight shifting;Manual facilitation for placement;Manual facilitation for weight bearing Transfers Transfers: Sit to Stand;Stand to Sit(with SARA)  Trunk/Postural Assessment  Cervical Assessment Cervical Assessment: Exceptions to [facility] Cervical AROM Overall Cervical AROM: Due to impaired cognition;Deficits;Other (comment) Thoracic Assessment Thoracic Assessment: Exceptions to [facility] Lumbar Assessment Lumbar Assessment: Exceptions to [facility] Postural Control Postural Control: Deficits on evaluation Head Control: unable to maintain head in midline  Trunk Control: able to sit unassisted EOM approximately ~5 min  Balance Balance Balance Assessed: Yes Static Sitting Balance Static Sitting - Balance Support: No upper extremity supported Static Sitting - Level of Assistance: 5: Stand by assistance Dynamic  Sitting Balance Sitting balance - Comments: min to mod A with multimodal cues for posture and directions Static Standing Balance Static Standing - Comment/# of Minutes: stood 3 x in [place] with external multimodal cues for posture at midline Extremity/Trunk Assessment RUE Assessment RUE Assessment: Within Functional Limits LUE Assessment LUE Assessment: Exceptions to [facility] LUE Strength LUE Overall Strength: Deficits LUE Tone LUE Tone: Within Functional Limits LUE Tone Comments: Brunstrom III overall weak; unable to grasp at this time but can move middle index   See Function Navigator for Current Functional Status.   Refer to Care Plan for Long Term Goals  Recommendations for other services: Neuropsych   Discharge Criteria: Patient will be discharged from OT if patient refuses treatment 3 consecutive times without medical reason, if treatment goals not met, if there is a change in medical status, if patient makes no progress towards goals or if patient is discharged from hospital.  The above assessment, treatment plan, treatment alternatives and goals were discussed and mutually agreed upon: by patient  Nicoletta Ba 07/29/2017, 1:29 PM

## 2017-07-29 NOTE — Progress Notes (Signed)
Initial Nutrition Assessment  DOCUMENTATION CODES:   Obesity unspecified  INTERVENTION:  48 hour calorie count initiated.---RD to follow up tomorrow for day 1 results.   Continue Nepro Shake po TID, each supplement provides 425 kcal and 19 grams protein.  Provide 30 ml Prostat po TID, each supplement provides 100 kcal and 15 grams of protein.   NUTRITION DIAGNOSIS:   Inadequate oral intake related to lethargy/confusion as evidenced by meal completion < 25%.  GOAL:   Patient will meet greater than or equal to 90% of their needs  MONITOR:   PO intake, Supplement acceptance, Labs, Weight trends, I & O's, Skin, Diet advancement  REASON FOR ASSESSMENT:   Consult Calorie Count  ASSESSMENT:   77 y.o. RH- male  with history of HTN, Alzheimer dementia, CKD III, and T2DM who was admitted on 07/23/17 from MD office with reports of fall, difficulty walking, drooling, right gaze preference and facial droop. 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 Cognitive evaluation revealed severe cognitive communication impairment question baseline but able to follow basic commands with left visual neglect. He continues to have issues with lethargy requiring tactile cues for activity, has significant left oral motor weakness--started on dysphagia 1, thin liquids and severe left neglect with pusher tendency.    Pt was asleep during time of visit and did not awaken to RD assessment. No family at bedside. RN reports pt po intake has been very poor. Pt refused lunch today. PO intake of 5% at breakfast this AM. Pt with no significant weight loss per weight records. Pt currently has Nepro shake ordered. RN reports he will consume them if you hold the bottle for patient. RD to additionally order Prostat to aid in adequate nutrition. Calorie count has been ordered by PA. RD to  follow up tomorrow with day 1 results.   Labs and medications reviewed.   NUTRITION - FOCUSED PHYSICAL EXAM:    Most Recent Value  Orbital Region  Unable to assess  Upper Arm Region  No depletion  Thoracic and Lumbar Region  No depletion  Buccal Region  No depletion  Temple Region  No depletion  Clavicle Bone Region  No depletion  Clavicle and Acromion Bone Region  No depletion  Scapular Bone Region  No depletion  Dorsal Hand  No depletion  Patellar Region  No depletion  Anterior Thigh Region  No depletion  Posterior Calf Region  No depletion  Edema (RD Assessment)  Mild  Hair  Reviewed  Eyes  Unable to assess  Mouth  Reviewed  Skin  Reviewed  Nails  Reviewed       Diet Order:  DIET - DYS 1 Room service appropriate? Yes; Fluid consistency: Thin  EDUCATION NEEDS:   Not appropriate for education at this time  Skin:  Skin Assessment: Reviewed RN Assessment  Last BM:  2/23  Height:   Ht Readings from Last 1 Encounters:  07/28/17 5\' 11"  (1.803 m)    Weight:   Wt Readings from Last 1 Encounters:  07/28/17 270 lb 15.1 oz (122.9 kg)    Ideal Body Weight:  78 kg  BMI:  Body mass index is 37.79 kg/m.  Estimated Nutritional Needs:   Kcal:  2050-2250  Protein:  100-115 grams  Fluid:  2- 2.2 L/day    Roslyn SmilingStephanie Tyus Kallam, MS, RD, LDN Pager # [phone] After hours/ weekend pager # [phone]

## 2017-07-30 ENCOUNTER — Inpatient Hospital Stay (HOSPITAL_COMMUNITY): Payer: Medicare Other | Admitting: Occupational Therapy

## 2017-07-30 ENCOUNTER — Inpatient Hospital Stay (HOSPITAL_COMMUNITY): Payer: Medicare Other | Admitting: Speech Pathology

## 2017-07-30 ENCOUNTER — Inpatient Hospital Stay (HOSPITAL_COMMUNITY): Payer: Medicare Other

## 2017-07-30 DIAGNOSIS — G8114 Spastic hemiplegia affecting left nondominant side: Secondary | ICD-10-CM

## 2017-07-30 DIAGNOSIS — R414 Neurologic neglect syndrome: Secondary | ICD-10-CM

## 2017-07-30 DIAGNOSIS — F0280 Dementia in other diseases classified elsewhere without behavioral disturbance: Secondary | ICD-10-CM

## 2017-07-30 DIAGNOSIS — I639 Cerebral infarction, unspecified: Secondary | ICD-10-CM

## 2017-07-30 DIAGNOSIS — G309 Alzheimer's disease, unspecified: Secondary | ICD-10-CM

## 2017-07-30 DIAGNOSIS — M79644 Pain in right finger(s): Secondary | ICD-10-CM

## 2017-07-30 DIAGNOSIS — F0150 Vascular dementia without behavioral disturbance: Secondary | ICD-10-CM

## 2017-07-30 LAB — GLUCOSE, CAPILLARY
GLUCOSE-CAPILLARY: 136 mg/dL — AB (ref 65–99)
Glucose-Capillary: 100 mg/dL — ABNORMAL HIGH (ref 65–99)
Glucose-Capillary: 119 mg/dL — ABNORMAL HIGH (ref 65–99)
Glucose-Capillary: 106 mg/dL — ABNORMAL HIGH (ref 65–99)
Glucose-Capillary: 157 mg/dL — ABNORMAL HIGH (ref 65–99)

## 2017-07-30 MED ORDER — ENSURE ENLIVE PO LIQD
237.0000 mL | Freq: Every day | ORAL | Status: DC
  Administered 2017-07-30 – 2017-08-02 (×3): 237 mL via ORAL

## 2017-07-30 MED ORDER — POLYETHYLENE GLYCOL 3350 17 G PO PACK
17.0000 g | PACK | Freq: Two times a day (BID) | ORAL | Status: DC
  Administered 2017-07-31 – 2017-08-24 (×25): 17 g via ORAL
  Filled 2017-07-30 (×36): qty 1

## 2017-07-30 NOTE — Progress Notes (Signed)
Physical Therapy Session Note  Patient Details  Name: Noah RumpfJames Ohalloran Jr. MRN: [id number] Date of Birth: [birth year]  Today's Date: 07/30/2017 PT Individual Time: 1415-1510 PT Individual Time Calculation (min): 55 min   Short Term Goals: Week 1:  PT Short Term Goal 1 (Week 1): Pt will roll to L with modA with use of bed rails  PT Short Term Goal 2 (Week 1): Pt will perform transfers using LRAD with maxA +2 PT Short Term Goal 3 (Week 1): Pt will perform sit<>stand using LRAD with maxA +1 PT Short Term Goal 4 (Week 1): Pt will maintain sitting balance for 5 miutes while engaging in functional reaching task  Skilled Therapeutic Interventions/Progress Updates:    Pt seated in TIS w/c upon therapist arrival. Pt reports no pain and is agreeable to participate in therapy session. Pt requests a coke to drink, attempt to have pt follow cues to turn head to the L to find cup. Attempt to have pt use RUE to grasp cup. Pt is fair at following cues to find cup and does not use RUE due to middle finger pain. Attempt to transfer pt to therapy mat in gym, pt has incontinence of bowel, assisted pt back to room. Attempt to perform sliding board transfer w/c to bed with 4" step under LLE due to knee ROM limitations, pt unable to follow cues to safely perform transfer. Placed maximove sling with assist x 2 for leaning to place sling properly. Maximove transfer w/c to bed assist x 2. Transferred pt to sitting EOB position with maximove due to L knee ROM limitations. Dependent to maintain sitting balance EOB. Sit to supine total assist x 2 for trunk control and LE management. Rolling L/R total assist x 2 for dependent pericare and brief change due to incontinence of bowel. Pt appears very lethargic and is unable to stay awake to assist in rolling towards end of therapy session.Pt left supine in bed with needs in reach and bed alarm in place.   Therapy Documentation Precautions:  Precautions Precautions: Fall Precaution  Comments: severe left neglect Restrictions Weight Bearing Restrictions: No  See Function Navigator for Current Functional Status.   Therapy/Group: Individual Therapy  Peter Congoaylor Ivan Lacher, PT, DPT  07/30/2017, 4:10 PM

## 2017-07-30 NOTE — Progress Notes (Signed)
Speech Language Pathology Daily Session Note  Patient Details  Name: Noah RumpfJames Castrillo Jr. MRN: [id number] Date of Birth: 1940-11-11  Today's Date: 07/30/2017 SLP Individual Time: 0830-0900 SLP Individual Time Calculation (min): 30 min  Short Term Goals: Week 1: SLP Short Term Goal 1 (Week 1): Pt will visually scan to midline in 25% of opportunities during basic, familiar tasks with max assist multimodal cues SLP Short Term Goal 2 (Week 1): Pt will sustain his attention to basic, familiar tasks for 1 minute with max verbal cues for redirection.  SLP Short Term Goal 3 (Week 1): Pt will utilize external aids to reorient to place, date, and situation with max assist multimodal cues.  SLP Short Term Goal 4 (Week 1): Pt will consume dys 1 textures and thin liquids with max cues for use of swallowing precautions and minimal overt s/s of aspiration.  Skilled Therapeutic Interventions: Skilled treatment session focused on dysphagia and cognition goals. SLP received pt asleep in bed. Pt required more than a reasonable amount of time to arouse and once minimally aroused he didn't appear interested in therapy objectives/activities. Pt frequently looked away from SLP when asked question. After much encouragement, pt consumption 5 sips of thin liquids via straw with no overt s/s of aspiration. Pt doesn't appear to have insight/motivation into deficits. Pt was left semi-recliner in bed with all needs within reach. Continue per current plan of care.      Function:  Eating Eating   Modified Consistency Diet: No Eating Assist Level: Helper feeds patient           Cognition Comprehension Comprehension assist level: Understands basic 50 - 74% of the time/ requires cueing 25 - 49% of the time  Expression   Expression assist level: Expresses basic 25 - 49% of the time/requires cueing 50 - 75% of the time. Uses single words/gestures.  Social Interaction Social Interaction assist level: Interacts appropriately  25 - 49% of time - Needs frequent redirection.  Problem Solving Problem solving assist level: Solves basic less than 25% of the time - needs direction nearly all the time or does not effectively solve problems and may need a restraint for safety  Memory Memory assist level: Recognizes or recalls less than 25% of the time/requires cueing greater than 75% of the time    Pain    Therapy/Group: Individual Therapy  Tahiry Spicer 07/30/2017, 1:47 PM

## 2017-07-30 NOTE — Plan of Care (Signed)
  [facility] Description See Patient Education module for education specifics  07/30/2017 0034 - Not Progressing by Mason JimKooy, Cephus Tupy, RN 07/30/2017 0034 - Not Progressing by Mason JimKooy, Kiwana Deblasi, RN Nutrition Consult-if indicated 07/30/2017 0034 - Not Progressing by Mason JimKooy, Jammi Morrissette, RN 07/30/2017 0034 - Not Progressing by Mason JimKooy, Taelor Waymire, RN Diabetes Guidelines if Diabetic/Glucose > 140 Description If diabetic or lab glucose is > 140 mg/dl - Initiate Diabetes/Hyperglycemia Guidelines & Document Interventions  07/30/2017 0034 - Not Progressing by Mason JimKooy, Ashlinn Hemrick, RN 07/30/2017 0034 - Not Progressing by Mason JimKooy, Dak Szumski, RN   RH BOWEL ELIMINATION RH STG MANAGE BOWEL WITH ASSISTANCE Description STG Manage Bowel with  Min Assistance.  07/30/2017 0034 - Not Progressing by Mason JimKooy, Eleina Jergens, RN 07/30/2017 0034 - Not Progressing by Mason JimKooy, Leory Allinson, RN RH STG MANAGE BOWEL W/MEDICATION W/ASSISTANCE Description STG Manage Bowel with Medication with mod   I  07/30/2017 0034 - Not Progressing by Mason JimKooy, Duron Meister, RN 07/30/2017 0034 - Not Progressing by Mason JimKooy, Evita Merida, RN   RH BLADDER ELIMINATION RH STG MANAGE BLADDER WITH ASSISTANCE Description STG Manage Bladder With mod  I  07/30/2017 0034 - Not Progressing by Mason JimKooy, Merrel Crabbe, RN 07/30/2017 0034 - Not Progressing by Mason JimKooy, Brinley Rosete, RN   RH SKIN INTEGRITY RH STG SKIN FREE OF INFECTION/BREAKDOWN 07/30/2017 0034 - Not Progressing by Mason JimKooy, Ekam Besson, RN 07/30/2017 0034 - Not Progressing by Mason JimKooy, Kristine Tiley, RN RH STG MAINTAIN SKIN INTEGRITY WITH ASSISTANCE Description STG Maintain Skin Integrity With max Assistance.  07/30/2017 0034 - Not Progressing by Mason JimKooy, Gaelyn Tukes, RN 07/30/2017 0034 - Not Progressing by Mason JimKooy, Kyleena Scheirer, RN   RH SAFETY RH STG ADHERE TO SAFETY PRECAUTIONS W/ASSISTANCE/DEVICE Description STG Adhere to Safety Precautions With mod  Assistance/Device.  07/30/2017 0034 - Not Progressing by Mason JimKooy, Deaundra Dupriest, RN 07/30/2017 0034 - Not Progressing by Mason JimKooy, Rhyan Wolters, RN RH  STG DECREASED RISK OF FALL WITH ASSISTANCE Description STG Decreased Risk of Fall With  Mod Assistance.  07/30/2017 0034 - Not Progressing by Mason JimKooy, Abe Schools, RN 07/30/2017 0034 - Not Progressing by Mason JimKooy, Daliah Chaudoin, RN   RH COGNITION-NURSING RH STG USES MEMORY AIDS/STRATEGIES W/ASSIST TO PROBLEM SOLVE Description STG Uses Memory Aids/Strategies With  Mod Assistance to Problem Solve.  07/30/2017 0034 - Not Progressing by Mason JimKooy, Cyril Woodmansee, RN 07/30/2017 0034 - Not Progressing by Mason JimKooy, Cornelious Bartolucci, RN   RH KNOWLEDGE DEFICIT RH STG INCREASE KNOWLEDGE OF DIABETES 07/30/2017 0034 - Not Progressing by Mason JimKooy, Jahi Roza, RN 07/30/2017 0034 - Not Progressing by Mason JimKooy, Sears Oran, RN RH STG INCREASE KNOWLEDGE OF HYPERTENSION 07/30/2017 0034 - Not Progressing by Mason JimKooy, Alianny Toelle, RN 07/30/2017 0034 - Not Progressing by Mason JimKooy, Latresha Yahr, RN RH STG INCREASE KNOWLEDGE OF DYSPHAGIA/FLUID INTAKE 07/30/2017 0034 - Not Progressing by Mason JimKooy, Treyana Sturgell, RN 07/30/2017 0034 - Not Progressing by Mason JimKooy, Valgene Deloatch, RN

## 2017-07-30 NOTE — Progress Notes (Signed)
Orthopedic Tech Progress Note Patient Details:  Bobby RumpfJames Crawshaw Jr. 1940-07-31 [id number]  Ortho Devices Type of Ortho Device: Finger splint Ortho Device/Splint Location: Patients fingers and hand badly contracted applied finger splint as is. Ortho Device/Splint Interventions: Application   Post Interventions Patient Tolerated: Poor Instructions Provided: Care of device   Saul FordyceJennifer C Nile Prisk 07/30/2017, 4:56 PM

## 2017-07-30 NOTE — Progress Notes (Signed)
[facility] Individual Statement of Services  Patient Name:  Noah RumpfJames Ybanez Jr.  Date:  07/30/2017  Welcome to the [facility].  Our goal is to provide you with an individualized program based on your diagnosis and situation, designed to meet your specific needs.  With this comprehensive rehabilitation program, you will be expected to participate in at least 3 hours of rehabilitation therapies Monday-Friday, with modified therapy programming on the weekends.  Your rehabilitation program will include the following services:  Physical Therapy (PT), Occupational Therapy (OT), Speech Therapy (ST), 24 hour per day rehabilitation nursing, Neuropsychology, Case Management (Social Worker), Rehabilitation Medicine, Nutrition Services and Pharmacy Services  Weekly team conferences will be held on Tuesdays to discuss your progress.  Your Social Worker will talk with you frequently to get your input and to update you on team discussions.  Team conferences with you and your family in attendance may also be held.  Expected length of stay:  21 to 28 days  Overall anticipated outcome:  Moderate assistance  Depending on your progress and recovery, your program may change. Your Social Worker will coordinate services and will keep you informed of any changes. Your Social Worker's name and contact numbers are listed  below.  The following services may also be recommended but are not provided by the [facility]:   Driving Evaluations  Home Health Rehabiltiation Services  Outpatient Rehabilitation Services   Arrangements will be made to provide these services after discharge if needed.  Arrangements include referral to agencies that provide these services.  Your insurance has been verified to be:  [employer] Your primary doctor is:  Judee ClaraJulie Johnson, FNP  Pertinent information will be shared with your doctor and your insurance  company.  Social Worker:  Staci AcostaJenny Amrit Cress, LCSW  [phone] or [phone]  Information discussed with and copy given to patient by: Elvera LennoxPrevatt, Ashauna Bertholf Capps, 07/30/2017, 11:42 AM

## 2017-07-30 NOTE — Progress Notes (Signed)
Day 1 of 2: Calorie Count Note  48 hour calorie count ordered.  Diet: Dysphagia 1 diet with thin liquids Supplements:   Nepro Shake po TID, each supplement provides 425 kcal and 19 grams protein.  30 ml Prostat po BID, each supplement provides 100 kcal and 15 grams of protein.   Breakfast: 0% Lunch: 0% Dinner: 0% Supplements: 1263 kcal, 78 grams of protein  Total intake: 1263 kcal (62% of minimum estimated needs)  78 grams of protein (78% of minimum estimated needs)  Estimated Nutritional Needs:  Kcal:  2050-2250 Protein:  100-115 grams Fluid:  2- 2.2 L/day  RN reports pt has been refusing food at meals. RN reports pt is drinking his supplements, however by dinner time, pt would only consume 50% supplement. Pt reports some nausea related to constipation. Last BM was 2/23. Bowel regimen medication given. RD to monitor if po intake will improve once pt has BM.   Nutrition Dx:  Inadequate oral intake related to lethargy/confusion as evidenced by meal completion < 25%; ongoing  Goal:  Patient will meet greater than or equal to 90% of their needs; not met  Intervention:   Continue Nepro Shake po TID, each supplement provides 425 kcal and 19 grams protein.   Continue 30 ml Prostat po TID, each supplement provides 100 kcal and 15 grams of protein.    Provide Ensure Enlive po once daily, each supplement provides 350 kcal and 20 grams of protein   Continue calorie count. RD to follow up tomorrow with day 2 results.   Corrin Parker, MS, RD, LDN Pager # [phone] After hours/ weekend pager # [phone]

## 2017-07-30 NOTE — Progress Notes (Signed)
Occupational Therapy Session Note  Patient Details  Name: Noah RumpfJames Kannan Jr. MRN: [id number] Date of Birth: April 11, 1941  Today's Date: 07/30/2017 OT Individual Time: 1015-1100 OT Individual Time Calculation (min): 45 min  Pt missed 15 mins skilled OT services-off unit for X-ray   Short Term Goals: Week 1:  OT Short Term Goal 1 (Week 1): Pt will maintain sitting balance during functional ADL task with occassional min  A OT Short Term Goal 2 (Week 1): Pt will be able to track to midline to locate ADL item with cues OT Short Term Goal 3 (Week 1): Pt will demonstration initaition with slide board transfers mat <>w/c OT Short Term Goal 4 (Week 1): Pt will don shirt with mod A  OT Short Term Goal 5 (Week 1): Pt will roll toward the left with max A to A with ADLs at bed level  Skilled Therapeutic Interventions/Progress Updates:    Ot intervention with focus on bed mobility, sitting balance, sit<>stand, attention to L, following one step commands, and safety awareness.  Pt able to bring head and eyes to midline but unable to avert gaze to his L.  Pt required tot A + 2 for bed mobility and supine>sit EOB.  Pt able to maintain sitting balance EOB at midline with min tactile cues and mod verbal cues.  Pt performed sit<>stand with tot A + 2 (3 musketeer) with max tactile cues for upright posture.  Pt unable to flex L knee.  Pt required tot A + 2 for stand pivot transfer to w/c.  Pt required tot A + 2 for positioning in w/c.  Pt remained in w/c and handed off to PT.   Therapy Documentation Precautions:  Precautions Precautions: Fall Precaution Comments: severe left neglect Restrictions Weight Bearing Restrictions: No General: General OT Amount of Missed Time: 15 Minutes      See Function Navigator for Current Functional Status.   Therapy/Group: Individual Therapy  Rich BraveLanier, Esti Demello Chappell 07/30/2017, 12:09 PM

## 2017-07-30 NOTE — Progress Notes (Signed)
Physical Therapy Session Note  Patient Details  Name: Noah RumpfJames Roger Jr. MRN: [id number] Date of Birth: [birth year]  Today's Date: 07/30/2017 PT Individual Time: 1100-1200 PT Individual Time Calculation (min): 60 min   Short Term Goals: Week 1:  PT Short Term Goal 1 (Week 1): Pt will roll to L with modA with use of bed rails  PT Short Term Goal 2 (Week 1): Pt will perform transfers using LRAD with maxA +2 PT Short Term Goal 3 (Week 1): Pt will perform sit<>stand using LRAD with maxA +1 PT Short Term Goal 4 (Week 1): Pt will maintain sitting balance for 5 miutes while engaging in functional reaching task  Skilled Therapeutic Interventions/Progress Updates: Pt received seated in w/c with handoff from OT: observation of pain behaviors when RUE utilized consistent with previous reports, however pt does not rate. Performed dependent sit <>stand in litegait to setup w/c. Gait with litegait and eva walker +3A for managing equipment and providing cueing for weight shifting to R in order to progress LLE. Required increased assist as pt fatigued in order to step LLE. Eva walker and litegait propelled forward to initiate anterior weight shifting and progression over stand LE. Pt reporting needing to use restroom. TotalA +3 squat pivot w/c <>standard toilet. Sit <>stand +2 "three muskateers" while third person assist with donning/doffing brief/pants and performing hygiene. Sitting balance on toilet min guard overall. Remained semi-reclined in tilt-in-space w/c at end of session, quick release belt intact and all needs in reach. NT alerted to request for soft touch call bell.      Therapy Documentation Precautions:  Precautions Precautions: Fall Precaution Comments: severe left neglect Restrictions Weight Bearing Restrictions: No Pain: Pain Assessment Pain Score: 0-No pain   See Function Navigator for Current Functional Status.   Therapy/Group: Individual Therapy  Vista Lawmanlizabeth J Tygielski 07/30/2017,  12:13 PM

## 2017-07-30 NOTE — Progress Notes (Addendum)
[facility]IC[facility] RE[facility]ABILITATION     PROGRESS NOTE  Subjective/Complaints:   Patient complains of right hand pain.  He identifies that his finger.  He also has had constipation with some nausea.  He is drinking liquids but not eating solids.  ROS: Denies CP, SOB, nausea, vomiting, diarrhea.  Objective: Vital Signs: Blood pressure (!) 124/59, pulse 78, temperature 98.6 F (37 C), temperature source Oral, resp. rate 20, height 5\' 11"  (1.803 m), weight 122.9 kg (270 lb 15.1 oz), SpO2 95 %. No results found. Recent Labs    07/29/17 0922  WBC 9.9  HGB 12.3*  HCT 37.1*  PLT 207   Recent Labs    07/28/17 0531 07/29/17 0922  NA 143 140  K 5.1 4.4  CL 115* 112*  GLUCOSE 117* 133*  BUN 19 26*  CREATININE 1.39* 1.38*  CALCIUM 9.0 9.3   CBG (last 3)  Recent Labs    07/29/17 1640 07/29/17 2103 07/30/17 0651  GLUCAP 118* 118* 119*    Wt Readings from Last 3 Encounters:  07/28/17 122.9 kg (270 lb 15.1 oz)  07/23/17 124.3 kg (274 lb)  06/29/17 124.3 kg (274 lb)    Physical Exam:  BP (!) 124/59 (BP Location: Left Arm)   Pulse 78   Temp 98.6 F (37 C) (Oral)   Resp 20   Ht 5\' 11"  (1.803 m)   Wt 122.9 kg (270 lb 15.1 oz)   SpO2 95%   BMI 37.79 kg/m  Constitutional: He appears well-developed. He is sleeping and cooperative. Obese  HENT: Normocephalic and atraumatic.  Eyes: No discharge. Dysconjugate gaze with right eye fixed to right lateral field. Right gaze preference  Neck: Keeps head fixed to right   Cardiovascular: Normal rate and regular rhythm. No JVD.  Respiratory: Effort normal and breath sounds normal.  GI: Bowel sounds are normal. He exhibits no distension.  Musculoskeletal: He exhibits tenderness over the right middle finger PIP and MCP.  There is some soft tissue swelling in that area.  Pain with joint range of motion.  No erythema, mild ecchymosis Neurological: He is alert.  A&Ox3 with significant delay in processing Left facial weakness  with moderate to severe dysarthria. Wet voice but able to clear oral secretions.   Able to follow simple motor commands with max cues.  Motor: RUE 3+/5 proximal to distal with ?tone RLE 2+/5 proximal to distal LUE: 2+/5 proximal to distal LLE: 3-/5 proximal to distal  Skin: Skin is warm and dry.  Well healed old incisions left knee with decreased ROM.   Psychiatric: His affect is blunt. His speech is delayed and slurred. He is slowed. Cognition and memory are impaired. He expresses inappropriate judgment.    Assessment/Plan: 1. 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 Physiatrist is providing close team supervision and 24 hour management of active medical problems listed below. Physiatrist and rehab team continue to assess barriers to discharge/monitor patient progress toward functional and medical goals.  Function:  Bathing Bathing position   Position: Bed  Bathing parts   Body parts bathed by helper: Right arm, Left arm, Chest, Abdomen, Front perineal area, Buttocks, Right upper leg, Left upper leg, Right lower leg, Left lower leg, Back  Bathing assist Assist Level: 2 helpers      Upper Body Dressing/Undressing Upper body dressing   What is the patient wearing?: Pull  over shirt/dress       Pull over shirt/dress - Perfomed by helper: Thread/unthread right sleeve, Thread/unthread left sleeve, Put head through opening, Pull shirt over trunk        Upper body assist Assist Level: 2 helpers      Lower Body Dressing/Undressing Lower body dressing   What is the patient wearing?: Pants, Non-skid slipper socks       Pants- Performed by helper: Thread/unthread right pants leg, Thread/unthread left pants leg, Pull pants up/down   Non-skid slipper socks- Performed by helper: Don/doff right sock, Don/doff left  sock                  Lower body assist Assist for lower body dressing: 2 Helpers      Toileting Toileting Toileting activity did not occur: No continent bowel/bladder event        Toileting assist Assist level: Two helpers   Transfers Chair/bed transfer   Chair/bed transfer method: Squat pivot Chair/bed transfer assist level: Total assist (Pt < 25%) Chair/bed transfer assistive device: Sliding board     Locomotion Ambulation Ambulation activity did not occur: Safety/medical concerns         Wheelchair   Type: Manual      Cognition Comprehension Comprehension assist level: Understands basic 50 - 74% of the time/ requires cueing 25 - 49% of the time  Expression Expression assist level: Expresses basic 25 - 49% of the time/requires cueing 50 - 75% of the time. Uses single words/gestures.  Social Interaction Social Interaction assist level: Interacts appropriately 25 - 49% of time - Needs frequent redirection.  Problem Solving Problem solving assist level: Solves basic less than 25% of the time - needs direction nearly all the time or does not effectively solve problems and may need a restraint for safety  Memory Memory assist level: Recognizes or recalls less than 25% of the time/requires cueing greater than 75% of the time    Medical Problem List and Plan: 1.  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   CIR PT OT speech 2.  DVT Prophylaxis/Anticoagulation: start  Pharmaceutical: Lovenox 3. Pain Management: tylenol prn. Added Sportscreme to help with joint stiffness bilateral hands and left knee.  Right hand middle finger pain and swelling check xray 4. Mood: Team to provide encouragement/ego support. LCSW to follow for evaluation when appropriate.  5. Alzheimer's dementia/ Neuropsych: This patient is not capable of making decisions on  his own behalf. Continue Namenda  , question baseline cognitive function 6. Skin/Wound Care: Pressure relief measures. Maintain adequate nutritional and hydration status.  7. Fluids/Electrolytes/Nutrition: Has been refusing intake--needs assistance/feeding for adequate intake. Added supplements (likes drinks cold)  to help with intake.  8. T2DM: Monitor BS ac/hs. Continue SSI and titrate as intake improves    CBG (last 3)  Recent Labs    07/29/17 1640 07/29/17 2103 07/30/17 0651  GLUCAP 118* 118* 119*  Controlled 2/28 9. HTN: Monitor BP bid--avoid hypotension. Continue Metoprolol XL.     Vitals:   07/29/17 1432 07/30/17 0147  BP: 130/74 (!) 124/59  Pulse: 82 78  Resp: 18 20  Temp: 97.6 F (36.4 C) 98.6 F (37 C)  SpO2: 96% 95%  Controlled 2/28  10. CKD stage III: Monitor renal status. Push fluid intake.    Cr 1.38 on 2/27-at baseline   Cont to monitor 11. Dyslipidemia: now on Lipitor.  12. Morbid obesity             Body mass index is 38.22 kg/m.             Diet and exercise education             Encourage weight loss to increase endurance and promote overall health 13. Acute blood loss anemia   Hemoglobin 12.3 on 2/27   Labs pending   LOS (Days) 2 A FACE TO FACE EVALUATION WAS PERFORMED  Erick Colacendrew E Shaguana Love 07/30/2017 9:14 AM

## 2017-07-31 ENCOUNTER — Inpatient Hospital Stay (HOSPITAL_COMMUNITY): Payer: Medicare Other | Admitting: Physical Therapy

## 2017-07-31 ENCOUNTER — Inpatient Hospital Stay (HOSPITAL_COMMUNITY): Payer: Medicare Other | Admitting: Speech Pathology

## 2017-07-31 ENCOUNTER — Inpatient Hospital Stay (HOSPITAL_COMMUNITY): Payer: Medicare Other | Admitting: Occupational Therapy

## 2017-07-31 LAB — GLUCOSE, CAPILLARY
GLUCOSE-CAPILLARY: 134 mg/dL — AB (ref 65–99)
GLUCOSE-CAPILLARY: 128 mg/dL — AB (ref 65–99)
GLUCOSE-CAPILLARY: 105 mg/dL — AB (ref 65–99)
GLUCOSE-CAPILLARY: 130 mg/dL — AB (ref 65–99)

## 2017-07-31 NOTE — IPOC Note (Signed)
Overall Plan of Care (IPOC) Patient Details Name: Noah Bush. MRN: [id number] DOB: Mar 29, 1941  Admitting Diagnosis: <principal problem not specified>  Hospital Problems: Active Problems:   Cerebral infarction (HCC)   Mixed Alzheimer's and vascular dementia   Acute ischemic right MCA stroke (HCC)   Acute blood loss anemia   Benign essential HTN     Functional Problem List: Nursing Bladder, Bowel, Edema, Endurance, Medication Management, Motor, Nutrition, Perception, Safety, Sensory  PT Balance, Endurance, Motor, Perception  OT Balance, Perception, Behavior, Safety, Cognition, Sensory, Edema, Skin Integrity, Motor, Nutrition, Endurance, Vision  SLP Cognition, Nutrition  TR         Basic ADL's: OT Grooming, Bathing, Dressing, Toileting, Eating     Advanced  ADL's: OT       Transfers: PT Bed Mobility, Bed to Chair, Car, Occupational psychologist, Research scientist (life sciences): PT Psychologist, prison and probation services, Ambulation, Stairs     Additional Impairments: OT Fuctional Use of Upper Extremity  SLP Swallowing, Social Cognition   Problem Solving, Memory, Attention, Awareness  TR      Anticipated Outcomes Item Anticipated Outcome  Self Feeding setup   Swallowing  min assist    Basic self-care  mod A overall  Toileting  mod A    Bathroom Transfers mod A   Bowel/Bladder  max assist  Transfers  modA slideboard/possibly squat pivot  Locomotion  W/C level modA  Communication  supervision   Cognition  mod assist   Pain  3 or less  Safety/Judgment  max assist   Therapy Plan: PT Intensity: Minimum of 1-2 x/day ,45 to 90 minutes PT Frequency: 5 out of 7 days PT Duration Estimated Length of Stay: 26-28 days OT Intensity: Minimum of 1-2 x/day, 45 to 90 minutes OT Frequency: 5 out of 7 days OT Duration/Estimated Length of Stay: ~25-28 days SLP Intensity: Minumum of 1-2 x/day, 30 to 90 minutes SLP Frequency: 3 to 5 out of 7 days SLP Duration/Estimated Length of Stay: 21-28  days     Team Interventions: Nursing Interventions Patient/Family Education, Bowel Management, Bladder Management, Disease Management/Prevention, Medication Management, Cognitive Remediation/Compensation, Discharge Planning, Dysphagia/Aspiration Precaution Training  PT interventions Ambulation/gait training, DME/adaptive equipment instruction, Neuromuscular re-education, Stair training, UE/LE Strength taining/ROM, Wheelchair propulsion/positioning, Discharge planning, Functional electrical stimulation, Warden/ranger, Therapeutic Activities, UE/LE Coordination activities, Cognitive remediation/compensation, Functional mobility training, Patient/family education, Splinting/orthotics, Therapeutic Exercise, Visual/perceptual remediation/compensation  OT Interventions Balance/vestibular training, Discharge planning, Functional electrical stimulation, Pain management, Self Care/advanced ADL retraining, Therapeutic Activities, UE/LE Coordination activities, Visual/perceptual remediation/compensation, Therapeutic Exercise, Skin care/wound managment, Patient/family education, Functional mobility training, Disease mangement/prevention, Cognitive remediation/compensation, Community reintegration, Fish farm manager, Neuromuscular re-education, Psychosocial support, Splinting/orthotics, UE/LE Strength taining/ROM, Wheelchair propulsion/positioning  SLP Interventions Cognitive remediation/compensation, Financial trader, Dysphagia/aspiration precaution training, Functional tasks, Patient/family education, Internal/external aids, Environmental controls  TR Interventions    SW/CM Interventions Discharge Planning, Psychosocial Support, Patient/Family Education   Barriers to Discharge MD  Medical stability and Nutritional means  Nursing Incontinence, Medication compliance    PT Home environment access/layout    OT      SLP      SW       Team Discharge Planning: Destination:  PT-Home ,OT- Home , SLP-Skilled Nursing Facility (SNF) Projected Follow-up: PT-Home health PT, Other (comment)(possibly SNF per pt progress), OT-  24 hour supervision/assistance, Home health OT, SLP-Skilled Nursing facility Projected Equipment Needs: PT-To be determined, OT- To be determined, SLP-None recommended by SLP Equipment Details: PT- , OT-  Patient/family involved in discharge  planning: PT- Patient, Family member/caregiver,  OT-Patient, Family member/caregiver, SLP-Patient unable/family or caregive not available  MD ELOS: 19-24d Medical Rehab Prognosis:  Good Assessment:  77 y.o.RH- male with history of HTN, Alzheimer dementia, CKD III, and T2DM who was admitted on 07/23/17 from MD office with reports of fall, difficulty walking, drooling, right gaze preference and facial droop. History taken from chart review. CTA/P head revealed R-MCA territory infarct involving right temporal and parietal lobe with heavily calcified carotid bifurcations and moderate stenosis right M1 and distal R- MCA with luxury perfusion and no mismatch.    He was not felt to be candidate for thrombolysis due to large infarct and  Dr. Pearlean BrownieSethi recommended DAPT X 3 months.   Follow up MRI brain reviewed, showing right MCA infarct.  Per report, revealed large acute on subacute R-MCA territory infarct with petechial hemorrhage, regional right cerebrum mass effect without midline shift and moderate chronic SVD.  2 D echo showed EF 40-45% with mild diffuse hypokinesis-- possible sever hypokinesis basal mid inferior and inferoseptal myocardium with grade one diastolic dysfunction. Outpatient TEE/Loop recorder recommended after discharge.  Cognitive evaluation revealed severe cognitive communication impairment question baseline but able to follow basic commands with left visual neglect. He continues to have issues with lethargy requiring tactile cues for activity, has significant left oral motor weakness--started on dysphagia 1,  thin liquids    Now requiring 24/7 Rehab RN,MD, as well as CIR level PT, OT and SLP.  Treatment team will focus on ADLs and mobility with goals set at Mod/MinA   See Team Conference Notes for weekly updates to the plan of care

## 2017-07-31 NOTE — Progress Notes (Signed)
[place] PHYSICAL MEDICINE & REHABILITATION     PROGRESS NOTE  Subjective/Complaints:   Patient continues to have right hand pain.  No new trauma to that area.  ROS: Denies CP, SOB, nausea, vomiting, diarrhea.  Objective: Vital Signs: Blood pressure 135/78, pulse 83, temperature 98.1 F (36.7 C), temperature source Oral, resp. rate 18, height 5\' 11"  (1.803 m), weight 122.9 kg (270 lb 15.1 oz), SpO2 98 %. Dg Finger Middle Right  Result Date: 07/30/2017 CLINICAL DATA:  Right middle finger pain. History of diabetes and gout. EXAM: RIGHT MIDDLE FINGER 2+V COMPARISON:  No recent prior. FINDINGS: Soft tissue swelling noted. Diffuse osteopenia and degenerative change. Degenerative changes most prominent about the proximal interphalangeal joint. Subtle fracture at the base of the middle phalanx of the right third digit cannot be excluded. Small exostosis noted of the proximal phalanx of the right second digit. IMPRESSION: Soft tissue swelling. Diffuse osteopenia and degenerative change. Degenerative changes most prominent about the proximal interphalangeal joint. Subtle fracture of the base of the middle phalanx of the right third digit cannot be completely excluded. Electronically Signed   By: Maisie Fushomas  Register   On: 07/30/2017 10:21   Recent Labs    07/29/17 0922  WBC 9.9  HGB 12.3*  HCT 37.1*  PLT 207   Recent Labs    07/29/17 0922  NA 140  K 4.4  CL 112*  GLUCOSE 133*  BUN 26*  CREATININE 1.38*  CALCIUM 9.3   CBG (last 3)  Recent Labs    07/30/17 1718 07/30/17 2132 07/31/17 0643  GLUCAP 106* 157* 134*    Wt Readings from Last 3 Encounters:  07/28/17 122.9 kg (270 lb 15.1 oz)  07/23/17 124.3 kg (274 lb)  06/29/17 124.3 kg (274 lb)    Physical Exam:  BP 135/78 (BP Location: Left Arm)   Pulse 83   Temp 98.1 F (36.7 C) (Oral)   Resp 18   Ht 5\' 11"  (1.803 m)   Wt 122.9 kg (270 lb 15.1 oz)   SpO2 98%   BMI 37.79 kg/m  Constitutional: He appears well-developed. He  is sleeping and cooperative. Obese  HENT: Normocephalic and atraumatic.  Eyes: No discharge. Dysconjugate gaze with right eye fixed to right lateral field. Right gaze preference  Neck: Keeps head fixed to right   Cardiovascular: Normal rate and regular rhythm. No JVD.  Respiratory: Effort normal and breath sounds normal.  GI: Bowel sounds are normal. He exhibits no distension.  Musculoskeletal: He exhibits right middle finger with splint, taped to the right ring finger.  There is some soft tissue swelling in that area.  Pain with joint range of motion.  No erythema, mild ecchymosis Neurological: He is alert.  A&Ox3 with significant delay in processing Left facial weakness with moderate to severe dysarthria. Wet voice but able to clear oral secretions.   Able to follow simple motor commands with max cues.  Motor: RUE 3+/5 proximal to distal with ?tone RLE 2+/5 proximal to distal LUE: 2+/5 proximal to distal LLE: 3-/5 proximal to distal  Skin: Skin is warm and dry.  Well healed old incisions left knee with decreased ROM.   Psychiatric: His affect is blunt. His speech is delayed and slurred. He is slowed. Cognition and memory are impaired. He expresses inappropriate judgment.    Assessment/Plan: 1. 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 Physiatrist  is providing close team supervision and 24 hour management of active medical problems listed below. Physiatrist and rehab team continue to assess barriers to discharge/monitor patient progress toward functional and medical goals.  Function:  Bathing Bathing position   Position: Bed  Bathing parts   Body parts bathed by helper: Right arm, Left arm, Chest, Abdomen, Front perineal area, Buttocks, Right upper leg, Left upper leg, Right lower leg, Left lower leg, Back  Bathing  assist Assist Level: 2 helpers      Upper Body Dressing/Undressing Upper body dressing   What is the patient wearing?: Pull over shirt/dress       Pull over shirt/dress - Perfomed by helper: Thread/unthread right sleeve, Thread/unthread left sleeve, Put head through opening, Pull shirt over trunk        Upper body assist Assist Level: 2 helpers      Lower Body Dressing/Undressing Lower body dressing   What is the patient wearing?: Pants, Non-skid slipper socks       Pants- Performed by helper: Thread/unthread right pants leg, Thread/unthread left pants leg, Pull pants up/down   Non-skid slipper socks- Performed by helper: Don/doff right sock, Don/doff left sock                  Lower body assist Assist for lower body dressing: 2 Helpers      Financial trader activity did not occur: No continent bowel/bladder event        Toileting assist Assist level: Two helpers   Transfers Chair/bed transfer   Chair/bed transfer method: Other Chair/bed transfer assist level: 2 helpers Chair/bed transfer assistive device: Systems developer lift: Maximove   Locomotion Ambulation Ambulation activity did not occur: Safety/medical concerns   Max distance: 25 Assist level: 2 helpers(+3)   Wheelchair   Type: Manual      Cognition Comprehension Comprehension assist level: Understands basic 25 - 49% of the time/ requires cueing 50 - 75% of the time  Expression Expression assist level: Expresses basic 25 - 49% of the time/requires cueing 50 - 75% of the time. Uses single words/gestures.  Social Interaction Social Interaction assist level: Interacts appropriately 25 - 49% of time - Needs frequent redirection.  Problem Solving Problem solving assist level: Solves basic less than 25% of the time - needs direction nearly all the time or does not effectively solve problems and may need a restraint for safety  Memory Memory assist level: Recognizes or recalls  less than 25% of the time/requires cueing greater than 75% of the time    Medical Problem List and Plan: 1.  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   CIR PT OT speech 2.  DVT Prophylaxis/Anticoagulation: start  Pharmaceutical: Lovenox 3. Pain Management: tylenol prn. Added Sportscreme to help with joint stiffness bilateral hands and left knee.  Right hand middle finger pain, base of fracture nondisplaced, splint, Ortho follow-up after hospitalization 4. Mood: Team to provide encouragement/ego support. LCSW to follow for evaluation when appropriate.  5. Alzheimer's dementia/ Neuropsych: This patient is not capable of making decisions on his own behalf. Continue Namenda  , question baseline cognitive function 6. Skin/Wound Care: Pressure relief measures. Maintain adequate nutritional and hydration status.  7. Fluids/Electrolytes/Nutrition: Has been refusing intake--needs assistance/feeding for adequate intake. Added supplements (likes drinks cold)  to help with intake.  8. T2DM: Monitor BS ac/hs. Continue SSI and titrate as intake improves  CBG (last 3)  Recent Labs    07/30/17 1718 07/30/17 2132 07/31/17 0643  GLUCAP 106* 157* 134*  Controlled 3/1 9. HTN: Monitor BP bid--avoid hypotension. Continue Metoprolol XL.     Vitals:   07/30/17 1357 07/31/17 0300  BP: (!) 145/73 135/78  Pulse: 95 83  Resp: 18 18  Temp: 97.6 F (36.4 C) 98.1 F (36.7 C)  SpO2: 98% 98%  Controlled 3/1  10. CKD stage III: Monitor renal status. Push fluid intake.    Cr 1.38 on 2/27-at baseline   Cont to monitor 11. Dyslipidemia: now on Lipitor.   12. Morbid obesity             Body mass index is 38.22 kg/m.             Diet and exercise education             Encourage weight loss to increase endurance and promote overall health 13. Acute blood loss anemia    Hemoglobin 12.3 on 2/27   Labs pending   LOS (Days) 3 A FACE TO FACE EVALUATION WAS PERFORMED  Erick Colace 07/31/2017 8:20 AM

## 2017-07-31 NOTE — Progress Notes (Signed)
Physical Therapy Session Note  Patient Details  Name: Noah RumpfJames Venable Jr. MRN: [id number] Date of Birth: 1940/12/11  Today's Date: 07/31/2017 PT Individual Time: 1500-1530 PT Individual Time Calculation (min): 30 min   Short Term Goals: Week 1:  PT Short Term Goal 1 (Week 1): Pt will roll to L with modA with use of bed rails  PT Short Term Goal 2 (Week 1): Pt will perform transfers using LRAD with maxA +2 PT Short Term Goal 3 (Week 1): Pt will perform sit<>stand using LRAD with maxA +1 PT Short Term Goal 4 (Week 1): Pt will maintain sitting balance for 5 miutes while engaging in functional reaching task  Skilled Therapeutic Interventions/Progress Updates:    Pt supine in bed upon therapist arrival. Pt is very lethargic and hard to arouse, with max encouragement is agreeable to participate in therapy session. Rolling L/R to check brief with total assist x 2, increased time and cues to engage RUE to perform rolling and using bedrail to assist with transfer. Supine BLE therex and stretches. Attempt to have pt perform AAROM, pt is unable to follow max verbal and manual cues to engage in exercises so PROM is performed. Pt left supine in bed with needs in reach and bed alarm in place.  Therapy Documentation Precautions:  Precautions Precautions: Fall Precaution Comments: severe left neglect Restrictions Weight Bearing Restrictions: No  See Function Navigator for Current Functional Status.   Therapy/Group: Individual Therapy  Peter Congoaylor Izzie Geers, PT, DPT  07/31/2017, 3:49 PM

## 2017-07-31 NOTE — Progress Notes (Signed)
Day 2 of 2: Calorie Count Note  48 hour calorie count ordered.  Diet: Dysphagia 1 diet with thin liquids Supplements:   Nepro Shake po TID, each supplement provides 425 kcal and 19 grams protein.  30 ml Prostat po TID, each supplement provides 100 kcal and 15 grams of protein.   Ensure Enlive po once daily, each supplement provides 350 kcal and 20 grams of protein  Breakfast: 300 kcal, 10 grams of protein Lunch: 20 kcal, 1 gram of protein Dinner: 0% Supplements: 1501 kcal, 104 grams of protein  Total intake: 1821 kcal (89% of minimum estimated needs)  115 grams of protein (100% of minimum estimated needs)  Estimated Nutritional Needs:  Kcal:  2050-2250 Protein:  100-115 grams Fluid:  2- 2.2 L/day  Pt has been refusing most meals. Nurse tech reports pt only wanted to take a couple of bites out of lunch and refused the rest. Pt currently has Nepro shake, Prostat, and Ensure ordered and has been consuming most of them. Nutritional only being met via nutritional supplements. Pt did have BM yesterday. RD to continue with nutritional supplements to aid in caloric and protein needs. Pt reports no abdominal pains during time of visit and was encouraged to eat his food at meals.   Nutrition Dx:  Inadequate oral intake related to lethargy/confusion as evidenced by meal completion < 25%; ongoing  Goal:  Patient will meet greater than or equal to 90% of their needs; met only via nutritional supplements  Intervention:   Continue Nepro Shake po TID, each supplement provides 425 kcal and 19 grams protein.   Continue 30 ml Prostat po TID, each supplement provides 100 kcal and 15 grams of protein.    Continue Ensure Enlive po once daily, each supplement provides 350 kcal and 20 grams of protein   Discontinue calorie count.   RD to continue to monitor.    Corrin Parker, MS, RD, LDN Pager # [phone] After hours/ weekend pager # [phone]

## 2017-07-31 NOTE — Plan of Care (Signed)
  Not Progressing [facility]ults [facility] PAT[facility] EDUCATION Description See Patient Education module for education specifics  07/31/2017 0058 - Not Progressing by Martina SinnerMurray, Rayshad Riviello A, RN RH BOWEL ELIMINATION RH STG MANAGE BOWEL WITH ASSISTANCE Description STG Manage Bowel with  max Assistance.   Changed goals to max assist, but currently total assist. 07/31/2017 0058 - Not Progressing by Martina SinnerMurray, Shevawn Langenberg A, RN RH STG MANAGE BOWEL W/MEDICATION W/ASSISTANCE Description STG Manage Bowel with Medication with max assist  07/31/2017 0058 - Not Progressing by Martina SinnerMurray, Dutch Ing A, RN RH BLADDER ELIMINATION RH STG MANAGE BLADDER WITH ASSISTANCE Description STG Manage Bladder With max assist  Changed goal to max assist, but currently total assist. 07/31/2017 0058 - Not Progressing by Martina SinnerMurray, Cleatus Goodin A, RN RH SKIN INTEGRITY RH STG SKIN FREE OF INFECTION/BREAKDOWN Description While on rehab with max.  07/31/2017 [id number] - Not Progressing by Martina SinnerMurray, Nyasia Baxley A, RN RH STG MAINTAIN SKIN INTEGRITY WITH ASSISTANCE Description STG Maintain Skin Integrity With max Assistance.  07/31/2017 0058 - Not Progressing by Martina SinnerMurray, Tynasia Mccaul A, RN RH SAFETY RH STG ADHERE TO SAFETY PRECAUTIONS W/ASSISTANCE/DEVICE Description STG Adhere to Safety Precautions With mod  Assistance/Device.  07/31/2017 [id number] - Not Progressing by Martina SinnerMurray, Sorayah Schrodt A, RN RH STG DECREASED RISK OF FALL WITH ASSISTANCE Description STG Decreased Risk of Fall With  Mod Assistance.  07/31/2017 [id number] - Not Progressing by Martina SinnerMurray, Deondrea Markos A, RN

## 2017-07-31 NOTE — Progress Notes (Signed)
Physical Therapy Session Note  Patient Details  Name: Noah RumpfJames Freeberg Jr. MRN: [id number] Date of Birth: April 24, 1941  Today's Date: 07/31/2017 PT Individual Time: 1300-1400 PT Individual Time Calculation (min): 60 min   Short Term Goals: Week 1:  PT Short Term Goal 1 (Week 1): Pt will roll to L with modA with use of bed rails  PT Short Term Goal 2 (Week 1): Pt will perform transfers using LRAD with maxA +2 PT Short Term Goal 3 (Week 1): Pt will perform sit<>stand using LRAD with maxA +1 PT Short Term Goal 4 (Week 1): Pt will maintain sitting balance for 5 miutes while engaging in functional reaching task  Skilled Therapeutic Interventions/Progress Updates: Pt received seated in w/c, denies pain and agreeable to treatment. Throughout session pt cued in L attention using multimodal cueing, manual assistance to turn head. Able to occasional turn head to midline spontaneously however reduced initiation and attention compared to yesterdays session. Gait with sara plus with +2A; requires assist for maintaining UEs on platform, assist for progressing LLE increased need for assist as pt fatigued. Manual assist/facilitation at L glute for stance control, weight shifting to R to improve L foot clearance. First gait trial x40' including 180 degree turn, second trial straight trajectory x60' with increased speed to facilitate reciprocal stepping pattern. Pt observed to have had incontinent bowel movement. Returned to bed with lateral scoot transfer using transfer board and maxA +2. Sit >supine totalA +2. Remained supine in bed at end of session, NT alerted to need for brief change.      Therapy Documentation Precautions:  Precautions Precautions: Fall Precaution Comments: severe left neglect Restrictions Weight Bearing Restrictions: No   See Function Navigator for Current Functional Status.   Therapy/Group: Individual Therapy  Vista Lawmanlizabeth J Tygielski 07/31/2017, 3:01 PM

## 2017-07-31 NOTE — Progress Notes (Signed)
Wearing condom cath. Incontinent of BM X 3 during night. Total assist of 2 to change patient. Slept good during night. Alfredo MartinezMurray, Ryn Peine A

## 2017-07-31 NOTE — Progress Notes (Signed)
Speech Language Pathology Daily Session Note  Patient Details  Name: Noah RumpfJames Tallie Jr. MRN: [id number] Date of Birth: March 27, 1941  Today's Date: 07/31/2017 SLP Individual Time: 0800-0900 SLP Individual Time Calculation (min): 60 min  Short Term Goals: Week 1: SLP Short Term Goal 1 (Week 1): Pt will visually scan to midline in 25% of opportunities during basic, familiar tasks with max assist multimodal cues SLP Short Term Goal 2 (Week 1): Pt will sustain his attention to basic, familiar tasks for 1 minute with max verbal cues for redirection.  SLP Short Term Goal 3 (Week 1): Pt will utilize external aids to reorient to place, date, and situation with max assist multimodal cues.  SLP Short Term Goal 4 (Week 1): Pt will consume dys 1 textures and thin liquids with max cues for use of swallowing precautions and minimal overt s/s of aspiration.  Skilled Therapeutic Interventions:   Pt was seen for skilled ST targeting cognitive goals.  Pt was transferred to wheelchair from bed via Maximove and +2 assist to maximize alertness for therapy.  Pt was able to complete oral care with initial hand over hand assist for task initiation which therapist was able to fade to min assist verbal cues for task cessation.  SLP facilitated the session with basic visual scanning tasks to facilitate scanning to midline.  Pt completed tasks with max assist multimodal cues for 80% accuracy.  Pt was returned to room and left in wheelchair with quick release belt donned and call bell within reach. Continue per current plan of care.     Function:  Eating Eating                 Cognition Comprehension Comprehension assist level: Understands basic 50 - 74% of the time/ requires cueing 25 - 49% of the time  Expression   Expression assist level: Expresses basic 50 - 74% of the time/requires cueing 25 - 49% of the time. Needs to repeat parts of sentences.  Social Interaction Social Interaction assist level: Interacts  appropriately 25 - 49% of time - Needs frequent redirection.  Problem Solving Problem solving assist level: Solves basic 25 - 49% of the time - needs direction more than half the time to initiate, plan or complete simple activities  Memory Memory assist level: Recognizes or recalls less than 25% of the time/requires cueing greater than 75% of the time    Pain Pain Assessment Pain Assessment: No/denies pain  Therapy/Group: Individual Therapy  Nani Ingram, Melanee SpryNicole L 07/31/2017, 9:05 AM

## 2017-07-31 NOTE — Progress Notes (Signed)
Occupational Therapy Session Note  Patient Details  Name: Noah RumpfJames Ekholm Jr. MRN: [id number] Date of Birth: 27-Apr-1941  Today's Date: 07/31/2017 OT Individual Time: 1000-1100 OT Individual Time Calculation (min): 60 min    Short Term Goals: Week 1:  OT Short Term Goal 1 (Week 1): Pt will maintain sitting balance during functional ADL task with occassional min  A OT Short Term Goal 2 (Week 1): Pt will be able to track to midline to locate ADL item with cues OT Short Term Goal 3 (Week 1): Pt will demonstration initaition with slide board transfers mat <>w/c OT Short Term Goal 4 (Week 1): Pt will don shirt with mod A  OT Short Term Goal 5 (Week 1): Pt will roll toward the left with max A to A with ADLs at bed level  Skilled Therapeutic Interventions/Progress Updates:    1:1 Pt received up and in the w/c. Pt performed sit to stand in 3 musketeer style with mod A +2 but then required max A to maintain standing challenged by difficulty with upright posture; to pull up paper pants. Pt had been incontient of bowel and pt unaware of accident. Used to SARA lift to promote standing and maintaining upright posture with bilateral UE support. Pt able to tolerate standing in SARA for 30 min while cleaning up patient (also voided bowel and urine while in standing). Pt with improved upright posture at midline and ability to follow one step directions with mod multimodal cues. Still challenged with tracking to midline.   Doffed right hand splint and re splinted ring and middle fingers (buddy tapping with support splint) with PCP in extension and pt with slight flexion in MCP. Discussed this with RN and PA.  Plan for RN to ice tonight.  Therapy Documentation Precautions:  Precautions Precautions: Fall Precaution Comments: severe left neglect Restrictions Weight Bearing Restrictions: No Pain: Pain Assessment Pain Assessment: No/denies pain Pain Score: 0-No pain ADL: ADL ADL Comments: see functional  navigator  See Function Navigator for Current Functional Status.   Therapy/Group: Individual Therapy  Roney MansSmith, Marjie Chea Surgisite Bostonynsey 07/31/2017, 11:39 AM

## 2017-08-01 ENCOUNTER — Inpatient Hospital Stay (HOSPITAL_COMMUNITY): Payer: Medicare Other

## 2017-08-01 ENCOUNTER — Inpatient Hospital Stay (HOSPITAL_COMMUNITY): Payer: Medicare Other | Admitting: Speech Pathology

## 2017-08-01 LAB — GLUCOSE, CAPILLARY
GLUCOSE-CAPILLARY: 177 mg/dL — AB (ref 65–99)
Glucose-Capillary: 111 mg/dL — ABNORMAL HIGH (ref 65–99)
Glucose-Capillary: 167 mg/dL — ABNORMAL HIGH (ref 65–99)
Glucose-Capillary: 118 mg/dL — ABNORMAL HIGH (ref 65–99)

## 2017-08-01 NOTE — Progress Notes (Signed)
Speech Language Pathology Daily Session Note  Patient Details  Name: Noah RumpfJames Schriefer Jr. MRN: [id number] Date of Birth: Nov 18, 1940  Today's Date: 08/01/2017 SLP Individual Time: 0930-1015 SLP Individual Time Calculation (min): 45 min  Short Term Goals: Week 1: SLP Short Term Goal 1 (Week 1): Pt will visually scan to midline in 25% of opportunities during basic, familiar tasks with max assist multimodal cues SLP Short Term Goal 2 (Week 1): Pt will sustain his attention to basic, familiar tasks for 1 minute with max verbal cues for redirection.  SLP Short Term Goal 3 (Week 1): Pt will utilize external aids to reorient to place, date, and situation with max assist multimodal cues.  SLP Short Term Goal 4 (Week 1): Pt will consume dys 1 textures and thin liquids with max cues for use of swallowing precautions and minimal overt s/s of aspiration.  Skilled Therapeutic Interventions: Skilled treatment session focused on dysphagia and cognition goals. SLP facilitated session by providing skilled observation of pt consuming dysphagia 1 with thin liquids. SLP received pt upright in wheelchair and provided treatment in dayroom in hopes of increased alertness and participation in therapy activities. Pt required Total A for feeding and refused any further PO consumption after 5 bites. Despite Total A cues/redirection etc, pt with only 5 bites of puree and 12 oz of thin liquids via straw. Advanced textures offered but pt refused. No overt s/s of aspiration with puree and thin liquids. Pt was more interactive during session and spoke of his granddaughter but he was not largely able to perform therapy driven tasks d/t severe cognitive deficits. SLP provided Total A hand over hand to locate objects on left with no indication of pt comprehending activity. Pt was returned to room, left upright in wheelchair with safety belt donned and all needs within reach. Continue per current plan of care.       Function:  Eating Eating   Modified Consistency Diet: Yes Eating Assist Level: Helper feeds patient;Help managing cup/glass;Helper brings food to mouth;Supervision or verbal cues;Set up assist for;More than reasonable amount of time   Eating Set Up Assist For: Opening containers   Helper Brings Food to Mouth: Every scoop   Cognition Comprehension Comprehension assist level: Understands basic 50 - 74% of the time/ requires cueing 25 - 49% of the time  Expression   Expression assist level: Expresses basic 25 - 49% of the time/requires cueing 50 - 75% of the time. Uses single words/gestures.  Social Interaction Social Interaction assist level: Interacts appropriately 50 - 74% of the time - May be physically or verbally inappropriate.  Problem Solving Problem solving assist level: Solves basic less than 25% of the time - needs direction nearly all the time or does not effectively solve problems and may need a restraint for safety  Memory Memory assist level: Recognizes or recalls less than 25% of the time/requires cueing greater than 75% of the time    Pain    Therapy/Group: Individual Therapy  Inesha Sow 08/01/2017, 9:46 AM

## 2017-08-01 NOTE — Plan of Care (Signed)
  [facility] Description See Patient Education module for education specifics  08/01/2017 2312 - Not Progressing by Martina SinnerMurray, Chike Farrington A, RN 08/01/2017 2241 - Not Progressing by Martina SinnerMurray, Donavon Kimrey A, RN RH BOWEL ELIMINATION RH STG MANAGE BOWEL WITH ASSISTANCE Description STG Manage Bowel with  max Assistance.   Total assist for all aspects of care. 08/01/2017 2312 - Not Progressing by Martina SinnerMurray, Clemencia Helzer A, RN 08/01/2017 2241 - Not Progressing by Martina SinnerMurray, Matheson Vandehei A, RN RH STG MANAGE BOWEL W/MEDICATION W/ASSISTANCE Description STG Manage Bowel with Medication with max assist  08/01/2017 2312 - Not Progressing by Martina SinnerMurray, Inetha Maret A, RN 08/01/2017 2241 - Not Progressing by Martina SinnerMurray, Fayette Hamada A, RN RH BLADDER ELIMINATION RH STG MANAGE BLADDER WITH ASSISTANCE Description STG Manage Bladder With max assist  08/01/2017 2312 - Not Progressing by Martina SinnerMurray, Karmon Andis A, RN 08/01/2017 2241 - Not Progressing by Martina SinnerMurray, Cricket Goodlin A, RN RH SKIN INTEGRITY RH STG SKIN FREE OF INFECTION/BREAKDOWN Description While on rehab with max.  08/01/2017 2312 - Not Progressing by Martina SinnerMurray, Giavana Rooke A, RN 08/01/2017 2241 - Not Progressing by Martina SinnerMurray, Marquize Seib A, RN RH STG MAINTAIN SKIN INTEGRITY WITH ASSISTANCE Description STG Maintain Skin Integrity With max Assistance.  08/01/2017 2312 - Not Progressing by Martina SinnerMurray, Allisson Schindel A, RN 08/01/2017 2241 - Not Progressing by Martina SinnerMurray, Yacoub Diltz A, RN RH COGNITION-NURSING RH STG USES MEMORY AIDS/STRATEGIES W/ASSIST TO PROBLEM SOLVE Description STG Uses Memory Aids/Strategies With  Mod Assistance to Problem Solve.  08/01/2017 2312 - Not Progressing by Martina SinnerMurray, Chandell Attridge A, RN 08/01/2017 2241 - Not Progressing by Martina SinnerMurray, Alzada Brazee A, RN RH KNOWLEDGE DEFICIT RH STG INCREASE KNOWLEDGE OF DIABETES 08/01/2017 2312 - Not Progressing by Martina SinnerMurray, Majesti Gambrell A, RN 08/01/2017 2241 - Not Progressing by Martina SinnerMurray, Mykel Sponaugle A, RN RH STG INCREASE KNOWLEDGE OF HYPERTENSION 08/01/2017 2312 - Not Progressing by Martina SinnerMurray, Greg Cratty A, RN 08/01/2017  2241 - Not Progressing by Martina SinnerMurray, Sharne Linders A, RN

## 2017-08-01 NOTE — Progress Notes (Signed)
Occupational Therapy Session Note  Patient Details  Name: Noah RumpfJames Cua Jr. MRN: [id number] Date of Birth: [birth year]  Today's Date: 08/01/2017 OT Individual Time: [id number] OT Individual Time Calculation (min): 57 min    Short Term Goals: Week 1:  OT Short Term Goal 1 (Week 1): Pt will maintain sitting balance during functional ADL task with occassional min  A OT Short Term Goal 2 (Week 1): Pt will be able to track to midline to locate ADL item with cues OT Short Term Goal 3 (Week 1): Pt will demonstration initaition with slide board transfers mat <>w/c OT Short Term Goal 4 (Week 1): Pt will don shirt with mod A  OT Short Term Goal 5 (Week 1): Pt will roll toward the left with max A to A with ADLs at bed level  Skilled Therapeutic Interventions/Progress Updates:    1:1. Focus of session on L attention, grooming, initiation, attention, arousal and cervical PROM in context of ADL. Pt alsleep upon arrival and requires multimodal cueing and increased time to arouse. Pt unable to turn head past midline d/t R neglect to locate any grooming items without PROM of neck and total cueing. Only time pt able to spontaneously turn head to L was when pt motivated to drink water and straw brushed on L corner of mouth. Pt requires HOH A to initate all grooming at bed level (washing face/eyes) and at sink (oral care). Pt able to activity assist OT with grooming once initiated movement if fully aroused. OT and +2 dons pants with total A rolling B with total A +2 for clothing mangment. Pt supine to sit +2 and sara plus used for transfer to w/c with total A and +2 used to keep BUE on arm rests. Pt left in room, seated in TIS with soft touch call light attatched to R chest, QRB donned and seat tilted back.   Therapy Documentation Precautions:  Precautions Precautions: Fall Precaution Comments: severe left neglect Restrictions Weight Bearing Restrictions: No General:   Vital Signs: Therapy Vitals Temp: 98 F  (36.7 C) Temp Source: Oral Pulse Rate: 91 Resp: 18 BP: (!) 122/55 Patient Position (if appropriate): Lying Oxygen Therapy SpO2: 96 % O2 Device: Room Air  See Function Navigator for Current Functional Status.   Therapy/Group: Individual Therapy  Shon HaleStephanie M Frances Ambrosino 08/01/2017, 8:55 AM

## 2017-08-01 NOTE — Progress Notes (Signed)
Occupational Therapy Session Note  Patient Details  Name: Noah RumpfJames Verdi Jr. MRN: [id number] Date of Birth: 12-Mar-1941  Today's Date: 08/01/2017 OT Individual Time: [id number] OT Individual Time Calculation (min): 28 min    Short Term Goals: Week 1:  OT Short Term Goal 1 (Week 1): Pt will maintain sitting balance during functional ADL task with occassional min  A OT Short Term Goal 2 (Week 1): Pt will be able to track to midline to locate ADL item with cues OT Short Term Goal 3 (Week 1): Pt will demonstration initaition with slide board transfers mat <>w/c OT Short Term Goal 4 (Week 1): Pt will don shirt with mod A  OT Short Term Goal 5 (Week 1): Pt will roll toward the left with max A to A with ADLs at bed level  Skilled Therapeutic Interventions/Progress Updates:    1:1. Pt with decreased arousal upon entering room, seated in bed. sister/niece present throughout session providing stimuli in L visual field. Pt requires increased time to arouse and maintain alertness, but able to clearly state "Your hands are cold" during PROM. OT provided cervical PROM in all planes with emphasist on L rotation and lateral flexion. Pt eventually asisting OT to rotate head when family talking to him on L. Pt given ice water in cup, pt able to bring cup to mouth with mod-max A for initation and NMR. Straw presented on L side of mouth to promote L attention/L rotation. Exited session with pt seated in bed, call light in reach and family present.   Therapy Documentation Precautions:  Precautions Precautions: Fall Precaution Comments: severe left neglect Restrictions Weight Bearing Restrictions: No  See Function Navigator for Current Functional Status.   Therapy/Group: Individual Therapy  Shon HaleStephanie M Huyen Perazzo 08/01/2017, 3:45 PM

## 2017-08-01 NOTE — Progress Notes (Signed)
Physical Therapy Session Note  Patient Details  Name: Noah RumpfJames Geis Jr. MRN: [id number] Date of Birth: Aug 09, 1940  Today's Date: 08/01/2017 PT Individual Time: 1300-1356 PT Individual Time Calculation (min): 56 min   Short Term Goals: Week 1:  PT Short Term Goal 1 (Week 1): Pt will roll to L with modA with use of bed rails  PT Short Term Goal 2 (Week 1): Pt will perform transfers using LRAD with maxA +2 PT Short Term Goal 3 (Week 1): Pt will perform sit<>stand using LRAD with maxA +1 PT Short Term Goal 4 (Week 1): Pt will maintain sitting balance for 5 miutes while engaging in functional reaching task  Skilled Therapeutic Interventions/Progress Updates:    Pt seated in TIS recliner upon PT arrival, agreeable to therapy tx and denies pain. Pt transported to gym in TIS. Session focused on seated balance and alertness, pt lethargic through entire session. Therapist removed armrests on w/c to decrease reliance on leaning, worked on finding midline and leaning in all directions with max to total assist. Worked on reaching with UEs, pt unable to lift either UE without assist and unable to hold onto items without hand over hand assist. Pt asking for cup of water, pt unable to reach/grasp the cup despite max multimodal cues, total assist to hold cup to drink water. Worked on cervical ROM to the L and visual tracking to the left, pt requiring max verbal cues. Pt transported back to room. Total assist +2 transfer from w/c>bed using maximove, total assist for lateral leans in order to place sling. Total assist+2 for rolling in either direction in bed to remove sling. Pt left supine in bed with needs in reach, bed alarm set.   Therapy Documentation Precautions:  Precautions Precautions: Fall Precaution Comments: severe left neglect Restrictions Weight Bearing Restrictions: No   See Function Navigator for Current Functional Status.   Therapy/Group: Individual Therapy  Cresenciano GenreEmily van Schagen, PT,  DPT 08/01/2017, 7:30 AM

## 2017-08-01 NOTE — Progress Notes (Signed)
[place] PHYSICAL MEDICINE & REHABILITATION     PROGRESS NOTE  Subjective/Complaints:   Patient back from therapy, grandson is at bedside.  ROS: Denies CP, SOB, nausea, vomiting, diarrhea.  Objective: Vital Signs: Blood pressure (!) 123/51, pulse 89, temperature 98 F (36.7 C), temperature source Oral, resp. rate 18, height 5\' 11"  (1.803 m), weight 122.9 kg (270 lb 15.1 oz), SpO2 96 %. No results found. No results for input(s): WBC, HGB, HCT, PLT in the last 72 hours. No results for input(s): NA, K, CL, GLUCOSE, BUN, CREATININE, CALCIUM in the last 72 hours.  Invalid input(s): CO CBG (last 3)  Recent Labs    07/31/17 1625 07/31/17 2050 08/01/17 0652  GLUCAP 105* 130* 111*    Wt Readings from Last 3 Encounters:  07/28/17 122.9 kg (270 lb 15.1 oz)  07/23/17 124.3 kg (274 lb)  06/29/17 124.3 kg (274 lb)    Physical Exam:  BP (!) 123/51   Pulse 89   Temp 98 F (36.7 C) (Oral)   Resp 18   Ht 5\' 11"  (1.803 m)   Wt 122.9 kg (270 lb 15.1 oz)   SpO2 96%   BMI 37.79 kg/m  Constitutional: He appears well-developed. He is sleeping and cooperative. Obese  HENT: Normocephalic and atraumatic.  Eyes: No discharge. Dysconjugate gaze with right eye fixed to right lateral field. Right gaze preference  Neck: Keeps head fixed to right   Cardiovascular: Normal rate and regular rhythm. No JVD.  Respiratory: Effort normal and breath sounds normal.  GI: Bowel sounds are normal. He exhibits no distension.  Musculoskeletal: He exhibits right middle finger with splint, taped to the right ring finger.  There is some soft tissue swelling in that area.  Pain with joint range of motion.  No erythema, mild ecchymosis Neurological: He is alert.  A&Ox3 with significant delay in processing Left facial weakness with moderate to severe dysarthria. Wet voice but able to clear oral secretions.   Able to follow simple motor commands with max cues.  Motor: RUE 3+/5 proximal to distal with ?tone RLE  2+/5 proximal to distal LUE: 2+/5 proximal to distal LLE: 3-/5 proximal to distal  Skin: Skin is warm and dry.  Well healed old incisions left knee with decreased ROM.   Psychiatric: His affect is blunt. His speech is delayed and slurred. He is slowed. Cognition and memory are impaired. He expresses inappropriate judgment.    Assessment/Plan: 1. 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 Physiatrist is providing close team supervision and 24 hour management of active medical problems listed below. Physiatrist and rehab team continue to assess barriers to discharge/monitor patient progress toward functional and medical goals.  Function:  Bathing Bathing position   Position: Bed  Bathing parts   Body parts bathed by helper: Right arm, Left arm, Chest, Abdomen, Front perineal area, Buttocks, Right upper leg, Left upper leg, Right lower leg, Left lower leg, Back  Bathing assist Assist Level: 2 helpers      Upper Body Dressing/Undressing Upper body dressing   What is the patient wearing?: Pull over shirt/dress       Pull over shirt/dress - Perfomed by helper: Thread/unthread right sleeve, Thread/unthread left sleeve, Put head through opening, Pull shirt over trunk        Upper body assist Assist Level: 2 helpers      Lower Body Dressing/Undressing  Lower body dressing   What is the patient wearing?: Pants, Non-skid slipper socks       Pants- Performed by helper: Thread/unthread right pants leg, Thread/unthread left pants leg, Pull pants up/down   Non-skid slipper socks- Performed by helper: Don/doff right sock, Don/doff left sock                  Lower body assist Assist for lower body dressing: 2 Helpers      Toileting Toileting Toileting activity did not occur: No continent bowel/bladder event         Toileting assist Assist level: Two helpers   Transfers Chair/bed transfer   Chair/bed transfer method: Lateral scoot Chair/bed transfer assist level: 2 helpers Chair/bed transfer assistive device: Sliding board Mechanical lift: Maximove   Locomotion Ambulation Ambulation activity did not occur: Safety/medical concerns   Max distance: 48' Assist level: 2 helpers   Wheelchair   Type: Manual   Assist Level: Dependent (Pt equals 0%)(dependently transported to gym)  Cognition Comprehension Comprehension assist level: Understands basic 50 - 74% of the time/ requires cueing 25 - 49% of the time  Expression Expression assist level: Expresses basic 25 - 49% of the time/requires cueing 50 - 75% of the time. Uses single words/gestures.  Social Interaction Social Interaction assist level: Interacts appropriately 50 - 74% of the time - May be physically or verbally inappropriate.  Problem Solving Problem solving assist level: Solves basic less than 25% of the time - needs direction nearly all the time or does not effectively solve problems and may need a restraint for safety  Memory Memory assist level: Recognizes or recalls less than 25% of the time/requires cueing greater than 75% of the time    Medical Problem List and Plan: 1.  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   CIR PT OT speech 2.  DVT Prophylaxis/Anticoagulation: start  Pharmaceutical: Lovenox 3. Pain Management: tylenol prn. Added Sportscreme to help with joint stiffness bilateral hands and left knee.  Right hand middle finger pain, base of fracture nondisplaced, splint, Ortho follow-up after hospitalization 4. Mood: Team to provide encouragement/ego support. LCSW to follow for evaluation when appropriate.  5. Alzheimer's dementia/ Neuropsych: This patient is not capable of making decisions  on his own behalf. Continue Namenda  , discussed cognitive issues with patient's grandson 6. Skin/Wound Care: Pressure relief measures. Maintain adequate nutritional and hydration status.  7. Fluids/Electrolytes/Nutrition: Has been refusing intake--needs assistance/feeding for adequate intake. Added supplements (likes drinks cold)  to help with intake.  8. T2DM: Monitor BS ac/hs. Continue SSI and titrate as intake improves    CBG (last 3)  Recent Labs    07/31/17 1625 07/31/17 2050 08/01/17 0652  GLUCAP 105* 130* 111*  Controlled 3/2 9. HTN: Monitor BP bid--avoid hypotension. Continue Metoprolol XL.     Vitals:   08/01/17 0500 08/01/17 0916  BP: (!) 122/55 (!) 123/51  Pulse: 91 89  Resp: 18   Temp: 98 F (36.7 C)   SpO2: 96%   Controlled 3/2  10. CKD stage III: Monitor renal status. Push fluid intake.    Cr 1.38 on 2/27-at baseline   Cont to monitor 11. Dyslipidemia: now on Lipitor.   12. Morbid obesity             Body mass index is 38.22 kg/m.  Diet and exercise education             Encourage weight loss to increase endurance and promote overall health 13. Acute blood loss anemia   Hemoglobin 12.3 on 2/27   Labs pending   LOS (Days) 4 A FACE TO FACE EVALUATION WAS PERFORMED  Erick Colace 08/01/2017 11:06 AM

## 2017-08-02 ENCOUNTER — Inpatient Hospital Stay (HOSPITAL_COMMUNITY): Payer: Medicare Other

## 2017-08-02 LAB — GLUCOSE, CAPILLARY
GLUCOSE-CAPILLARY: 119 mg/dL — AB (ref 65–99)
Glucose-Capillary: 128 mg/dL — ABNORMAL HIGH (ref 65–99)
Glucose-Capillary: 137 mg/dL — ABNORMAL HIGH (ref 65–99)
Glucose-Capillary: 150 mg/dL — ABNORMAL HIGH (ref 65–99)

## 2017-08-02 NOTE — Progress Notes (Signed)
Incontinent of stool at HS. Wearing condom cath to manage incontinence. Condom cath came off once during night-Total assist. Poor PO intake. Left neglect. Noah MartinezMurray, [name] A

## 2017-08-02 NOTE — Plan of Care (Signed)
  Not Progressing Consults [facility] STROKE PATIENT EDUCATION Description See Patient Education module for education specifics  08/02/2017 2330 - Not Progressing by Martina SinnerMurray, Christophr Calix A, RN RH BOWEL ELIMINATION RH STG MANAGE BOWEL WITH ASSISTANCE Description STG Manage Bowel with  max Assistance.   08/02/2017 2330 - Not Progressing by Martina SinnerMurray, Zaeem Kandel A, RN RH STG MANAGE BOWEL W/MEDICATION W/ASSISTANCE Description STG Manage Bowel with Medication with max assist  08/02/2017 2330 - Not Progressing by Martina SinnerMurray, Jacqueline Spofford A, RN RH BLADDER ELIMINATION RH STG MANAGE BLADDER WITH ASSISTANCE Description STG Manage Bladder With max assist  08/02/2017 2330 - Not Progressing by Martina SinnerMurray, Kyng Matlock A, RN RH SKIN INTEGRITY RH STG SKIN FREE OF INFECTION/BREAKDOWN Description While on rehab with max.  08/02/2017 2330 - Not Progressing by Martina SinnerMurray, Ibrahima Holberg A, RN RH STG MAINTAIN SKIN INTEGRITY WITH ASSISTANCE Description STG Maintain Skin Integrity With max Assistance.  08/02/2017 2330 - Not Progressing by Martina SinnerMurray, Keshun Berrett A, RN RH COGNITION-NURSING RH STG USES MEMORY AIDS/STRATEGIES W/ASSIST TO PROBLEM SOLVE Description STG Uses Memory Aids/Strategies With  Mod Assistance to Problem Solve.  08/02/2017 2330 - Not Progressing by Martina SinnerMurray, Deirdra Heumann A, RN RH KNOWLEDGE DEFICIT RH STG INCREASE KNOWLEDGE OF DIABETES 08/02/2017 2330 - Not Progressing by Martina SinnerMurray, Galit Urich A, RN RH STG INCREASE KNOWLEDGE OF HYPERTENSION 08/02/2017 2330 - Not Progressing by Martina SinnerMurray, Kiannah Grunow A, RN

## 2017-08-02 NOTE — Progress Notes (Signed)
[place] PHYSICAL MEDICINE & REHABILITATION     PROGRESS NOTE  Subjective/Complaints:   Patient without new issues today.  Appreciate nursing note is incontinent at night.  He has some foot pain on the right side has a history of gout.  Patient did have a fall and fractured the right middle finger and this is currently splinted.  ROS: Denies CP, SOB, nausea, vomiting, diarrhea.  Objective: Vital Signs: Blood pressure 140/61, pulse 69, temperature 98.9 F (37.2 C), temperature source Oral, resp. rate 16, height 5\' 11"  (1.803 m), weight 122.9 kg (270 lb 15.1 oz), SpO2 98 %. No results found. No results for input(s): WBC, HGB, HCT, PLT in the last 72 hours. No results for input(s): NA, K, CL, GLUCOSE, BUN, CREATININE, CALCIUM in the last 72 hours.  Invalid input(s): CO CBG (last 3)  Recent Labs    08/01/17 1626 08/01/17 2108 08/02/17 0626  GLUCAP 118* 177* 128*    Wt Readings from Last 3 Encounters:  07/28/17 122.9 kg (270 lb 15.1 oz)  07/23/17 124.3 kg (274 lb)  06/29/17 124.3 kg (274 lb)    Physical Exam:  BP 140/61 (BP Location: Left Arm)   Pulse 69   Temp 98.9 F (37.2 C) (Oral)   Resp 16   Ht 5\' 11"  (1.803 m)   Wt 122.9 kg (270 lb 15.1 oz)   SpO2 98%   BMI 37.79 kg/m  Constitutional: He appears well-developed. He is sleeping and cooperative. Obese  HENT: Normocephalic and atraumatic.  Eyes: No discharge. Dysconjugate gaze with right eye fixed to right lateral field. Right gaze preference  Neck: Keeps head fixed to right   Cardiovascular: Normal rate and regular rhythm. No JVD.  Respiratory: Effort normal and breath sounds normal.  GI: Bowel sounds are normal. He exhibits no distension.  Musculoskeletal: He exhibits right middle finger with splint, taped to the right ring finger.  There is some soft tissue swelling in that area.  Pain with joint range of motion.  No erythema, mild ecchymosis Neurological: He is alert.  A&Ox3 with significant delay in  processing Left facial weakness with moderate to severe dysarthria. Wet voice but able to clear oral secretions.   Able to follow simple motor commands with max cues.  Motor: RUE 3+/5 proximal to distal with ?tone RLE 2+/5 proximal to distal LUE: 2+/5 proximal to distal LLE: 3-/5 proximal to distal  Skin: Skin is warm and dry.  Well healed old incisions left knee with decreased ROM.   Psychiatric: His affect is blunt. His speech is delayed and slurred. He is slowed. Cognition and memory are impaired. He expresses inappropriate judgment.  Erythema right first MTP tenderness to palpation and with range of motion  Assessment/Plan: 1. 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 Physiatrist is providing close team supervision and 24 hour management of active medical problems listed below. Physiatrist and rehab team continue to assess barriers to discharge/monitor patient progress toward functional and medical goals.  Function:  Bathing Bathing position   Position: Bed  Bathing parts   Body parts bathed by helper: Right arm, Left arm, Chest, Abdomen, Front perineal area, Buttocks, Right upper leg, Left upper leg, Right lower leg, Left lower leg, Back  Bathing assist Assist Level: 2 helpers      Upper Body Dressing/Undressing Upper body dressing   What is the patient wearing?: Pull over  shirt/dress       Pull over shirt/dress - Perfomed by helper: Thread/unthread right sleeve, Thread/unthread left sleeve, Put head through opening, Pull shirt over trunk        Upper body assist Assist Level: 2 helpers      Lower Body Dressing/Undressing Lower body dressing   What is the patient wearing?: Pants, Non-skid slipper socks       Pants- Performed by helper: Thread/unthread right pants leg, Thread/unthread left pants leg,  Pull pants up/down   Non-skid slipper socks- Performed by helper: Don/doff right sock, Don/doff left sock                  Lower body assist Assist for lower body dressing: 2 Helpers      Toileting Toileting Toileting activity did not occur: No continent bowel/bladder event        Toileting assist Assist level: Two helpers   Transfers Chair/bed transfer   Chair/bed transfer method: Lateral scoot Chair/bed transfer assist level: 2 helpers Chair/bed transfer assistive device: Systems developer lift: Maximove   Locomotion Ambulation Ambulation activity did not occur: Safety/medical concerns   Max distance: 8' Assist level: 2 helpers   Wheelchair   Type: Manual   Assist Level: Dependent (Pt equals 0%)(dependently transported to gym)  Cognition Comprehension Comprehension assist level: Understands basic 50 - 74% of the time/ requires cueing 25 - 49% of the time  Expression Expression assist level: Expresses basic 25 - 49% of the time/requires cueing 50 - 75% of the time. Uses single words/gestures.  Social Interaction Social Interaction assist level: Interacts appropriately 50 - 74% of the time - May be physically or verbally inappropriate.  Problem Solving Problem solving assist level: Solves basic less than 25% of the time - needs direction nearly all the time or does not effectively solve problems and may need a restraint for safety  Memory Memory assist level: Recognizes or recalls less than 25% of the time/requires cueing greater than 75% of the time    Medical Problem List and Plan: 1.  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   CIR PT OT speech 2.  DVT Prophylaxis/Anticoagulation: start  Pharmaceutical: Lovenox 3. Pain Management: tylenol prn. Added Sportscreme to help with joint stiffness bilateral hands and left knee.   Right hand middle finger pain, base of fracture nondisplaced, splint, Ortho follow-up after hospitalization Right first MTP pain I suspect gout but given his history of trauma will check x-ray 4. Mood: Team to provide encouragement/ego support. LCSW to follow for evaluation when appropriate.  5. Alzheimer's dementia/ Neuropsych: This patient is not capable of making decisions on his own behalf. Continue Namenda  , discussed cognitive issues with patient's grandson 6. Skin/Wound Care: Pressure relief measures. Maintain adequate nutritional and hydration status.  7. Fluids/Electrolytes/Nutrition: Has been refusing intake--needs assistance/feeding for adequate intake. Added supplements (likes drinks cold)  to help with intake.  8. T2DM: Monitor BS ac/hs. Continue SSI and titrate as intake improves    CBG (last 3)  Recent Labs    08/01/17 1626 08/01/17 2108 08/02/17 0626  GLUCAP 118* 177* 128*  Controlled 3/3 9. HTN: Monitor BP bid--avoid hypotension. Continue Metoprolol XL.     Vitals:   08/01/17 1650 08/02/17 0653  BP: 124/60 140/61  Pulse: 79 69  Resp: 18 16  Temp: 98.7 F (37.1 C) 98.9 F (37.2 C)  SpO2: 96% 98%  Controlled 3/3  10. CKD stage III: Monitor renal status. Push fluid intake.    Cr 1.38 on 2/27-at baseline   Cont to monitor 11. Dyslipidemia: now on Lipitor.   12. Morbid obesity             Body mass index is 38.22 kg/m.             Diet and exercise education             Encourage weight loss to increase endurance and promote overall health 13. Acute blood loss anemia   Hemoglobin 12.3 on 2/27   Labs pending 14.  Bowel incontinence this is likely related to his cognitive status, nursing working on achieving continence  LOS (Days) 5 A FACE TO FACE EVALUATION WAS PERFORMED  Erick Colacendrew E Kirsteins 08/02/2017 10:05 AM

## 2017-08-02 NOTE — Plan of Care (Signed)
  [facility] Description See Patient Education module for education specifics  08/02/2017 [phone] - Not Progressing by Laney Louderback, Danella MaiersAshley M, RN Nutrition Consult-if indicated 08/02/2017 [phone] - Not Progressing by Yolonda Kidaoyal, Kaamil Morefield M, RN Diabetes Guidelines if Diabetic/Glucose > 140 Description If diabetic or lab glucose is > 140 mg/dl - Initiate Diabetes/Hyperglycemia Guidelines & Document Interventions  08/02/2017 0914 - Not Progressing by Kenan Moodie, Danella MaiersAshley M, RN RH BOWEL ELIMINATION RH STG MANAGE BOWEL WITH ASSISTANCE Description STG Manage Bowel with  max Assistance.   08/02/2017 [id number] - Not Progressing by Quintella Mura, Danella MaiersAshley M, RN RH STG MANAGE BOWEL W/MEDICATION W/ASSISTANCE Description STG Manage Bowel with Medication with max assist  08/02/2017 [id number] - Not Progressing by Roselynne Lortz, Danella MaiersAshley M, RN RH BLADDER ELIMINATION RH STG MANAGE BLADDER WITH ASSISTANCE Description STG Manage Bladder With max assist  08/02/2017 0914 - Not Progressing by Jenson Beedle, Danella MaiersAshley M, RN RH SKIN INTEGRITY RH STG SKIN FREE OF INFECTION/BREAKDOWN Description While on rehab with max.  08/02/2017 [id number] - Not Progressing by Liv Rallis, Danella MaiersAshley M, RN RH STG MAINTAIN SKIN INTEGRITY WITH ASSISTANCE Description STG Maintain Skin Integrity With max Assistance.  08/02/2017 [id number] - Not Progressing by Yolonda Kidaoyal, Gwynevere Lizana M, RN RH SAFETY RH STG ADHERE TO SAFETY PRECAUTIONS W/ASSISTANCE/DEVICE Description STG Adhere to Safety Precautions With mod  Assistance/Device.  08/02/2017 [id number] - Not Progressing by Arlana Canizales, Danella MaiersAshley M, RN RH STG DECREASED RISK OF FALL WITH ASSISTANCE Description STG Decreased Risk of Fall With  Mod Assistance.  08/02/2017 [id number] - Not Progressing by Marshell Dilauro, Danella MaiersAshley M, RN RH COGNITION-NURSING RH STG USES MEMORY AIDS/STRATEGIES W/ASSIST TO PROBLEM SOLVE Description STG Uses Memory Aids/Strategies With  Mod Assistance to Problem Solve.  08/02/2017 [id number] - Not Progressing by Yolonda Kidaoyal, Chonda Baney M, RN RH KNOWLEDGE DEFICIT RH  STG INCREASE KNOWLEDGE OF DIABETES 08/02/2017 0914 - Not Progressing by Yolonda Kidaoyal, Gaither Biehn M, RN RH STG INCREASE KNOWLEDGE OF HYPERTENSION 08/02/2017 0914 - Not Progressing by Yolonda Kidaoyal, Yoshiye Kraft M, RN RH STG INCREASE KNOWLEDGE OF DYSPHAGIA/FLUID INTAKE 08/02/2017 [id number] - Not Progressing by Yolonda Kidaoyal, Gala Padovano M, RN  Requires total assist at this time

## 2017-08-03 ENCOUNTER — Inpatient Hospital Stay (HOSPITAL_COMMUNITY): Payer: Medicare Other | Admitting: Occupational Therapy

## 2017-08-03 ENCOUNTER — Inpatient Hospital Stay (HOSPITAL_COMMUNITY): Payer: Medicare Other

## 2017-08-03 LAB — GLUCOSE, CAPILLARY
GLUCOSE-CAPILLARY: 119 mg/dL — AB (ref 65–99)
GLUCOSE-CAPILLARY: 117 mg/dL — AB (ref 65–99)
GLUCOSE-CAPILLARY: 195 mg/dL — AB (ref 65–99)
Glucose-Capillary: 186 mg/dL — ABNORMAL HIGH (ref 65–99)

## 2017-08-03 NOTE — Progress Notes (Signed)
[place] PHYSICAL MEDICINE & REHABILITATION     PROGRESS NOTE  Subjective/Complaints:   Had a reasonable night. Condom cath comes loose with associated wetness. Pt with out complaints  ROS: pt denies nausea, vomiting, diarrhea, cough, shortness of breath or chest pain    Objective: Vital Signs: Blood pressure (!) 148/97, pulse 99, temperature 98.9 F (37.2 C), temperature source Oral, resp. rate 16, height 5\' 11"  (1.803 m), weight 122.9 kg (270 lb 15.1 oz), SpO2 98 %. Dg Foot 2 Views Right  Result Date: 08/02/2017 CLINICAL DATA:  Great toe pain. EXAM: RIGHT FOOT - 2 VIEW COMPARISON:  None. FINDINGS: No acute fracture or dislocation. Severe osteopenia. 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 No other soft tissue abnormality. IMPRESSION: No acute osseous injury of the right foot. Electronically Signed   By: Elige Ko   On: 08/02/2017 12:47   No results for input(s): WBC, HGB, HCT, PLT in the last 72 hours. No results for input(s): NA, K, CL, GLUCOSE, BUN, CREATININE, CALCIUM in the last 72 hours.  Invalid input(s): CO CBG (last 3)  Recent Labs    08/02/17 1649 08/02/17 2057 08/03/17 0626  GLUCAP 119* 150* 119*    Wt Readings from Last 3 Encounters:  07/28/17 122.9 kg (270 lb 15.1 oz)  07/23/17 124.3 kg (274 lb)  06/29/17 124.3 kg (274 lb)    Physical Exam:  BP (!) 148/97 (BP Location: Left Arm)   Pulse 99   Temp 98.9 F (37.2 C) (Oral)   Resp 16   Ht 5\' 11"  (1.803 m)   Wt 122.9 kg (270 lb 15.1 oz)   SpO2 98%   BMI 37.79 kg/m  Constitutional: He appears well-developed. He is sleeping and cooperative. Obese  HENT: Normocephalic and atraumatic.  Eyes: No discharge Ongoing. Dysconjugate gaze with right eye fixed to right lateral field. Right gaze preference  Neck: head turned rihgt   Cardiovascular: RRR without murmur. No JVD   Respiratory: Effort normal and breath sounds normal.  GI: Bowel sounds are normal. He  exhibits no distension.  Musculoskeletal: He exhibits right middle finger with splint, taped to the right ring finger.  There is some soft tissue swelling in that area.  Pain with joint range of motion.  No erythema, mild ecchymosis Neurological:   A&Ox3 with significant delay in processing Left facial weakness with moderate to severe dysarthria. Wet voice but able to clear oral secretions.   Able to follow simple motor commands with max cues.  Motor: RUE 3+/5 proximal to distal with ?tone RLE 2+/5 proximal to distal LUE: 2+5 proximal to distal LLE: 3- to 3/5 proximal to distal  Skin: Skin is warm and dry.  Well healed old incisions left knee with decreased ROM.   Psychiatric: blunt affect, delayed     Assessment/Plan: 1. 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 Physiatrist is providing close team supervision and 24 hour management of active medical problems listed below. Physiatrist and rehab team continue to assess barriers to discharge/monitor patient progress toward functional and medical goals.  Function:  Bathing Bathing position   Position: Bed  Bathing parts   Body parts bathed by helper: Right arm, Left arm, Chest, Abdomen, Front perineal area, Buttocks, Right upper leg, Left upper leg, Right lower leg, Left lower leg, Back  Bathing assist Assist Level: 2 helpers  Upper Body Dressing/Undressing Upper body dressing   What is the patient wearing?: Pull over shirt/dress       Pull over shirt/dress - Perfomed by helper: Thread/unthread right sleeve, Thread/unthread left sleeve, Put head through opening, Pull shirt over trunk        Upper body assist Assist Level: 2 helpers      Lower Body Dressing/Undressing Lower body dressing   What is the patient wearing?: Pants, Non-skid slipper socks        Pants- Performed by helper: Thread/unthread right pants leg, Thread/unthread left pants leg, Pull pants up/down   Non-skid slipper socks- Performed by helper: Don/doff right sock, Don/doff left sock                  Lower body assist Assist for lower body dressing: 2 Helpers      Toileting Toileting Toileting activity did not occur: No continent bowel/bladder event        Toileting assist Assist level: Two helpers   Transfers Chair/bed transfer   Chair/bed transfer method: Lateral scoot Chair/bed transfer assist level: 2 helpers Chair/bed transfer assistive device: Systems developerMechanical lift Mechanical lift: Maximove   Locomotion Ambulation Ambulation activity did not occur: Safety/medical concerns   Max distance: 8660' Assist level: 2 helpers   Wheelchair   Type: Manual   Assist Level: Dependent (Pt equals 0%)(dependently transported to gym)  Cognition Comprehension Comprehension assist level: Understands basic 50 - 74% of the time/ requires cueing 25 - 49% of the time, Understands basic 25 - 49% of the time/ requires cueing 50 - 75% of the time  Expression Expression assist level: Expresses basic 25 - 49% of the time/requires cueing 50 - 75% of the time. Uses single words/gestures.  Social Interaction Social Interaction assist level: Interacts appropriately 50 - 74% of the time - May be physically or verbally inappropriate.  Problem Solving Problem solving assist level: Solves basic less than 25% of the time - needs direction nearly all the time or does not effectively solve problems and may need a restraint for safety  Memory Memory assist level: Recognizes or recalls less than 25% of the time/requires cueing greater than 75% of the time    Medical Problem List and Plan: 1.  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   CIR PT OT speech  ongoing 2.  DVT Prophylaxis/Anticoagulation: start  Pharmaceutical: Lovenox 3. Pain Management: tylenol prn. Added Sportscreme to help with joint stiffness bilateral hands and left knee.  Right hand middle finger pain, base of fracture nondisplaced, splint, Ortho follow-up after hospitalization Right first MTP pain likely gout. xrays negative 4. Mood: Team to provide encouragement/ego support. LCSW to follow for evaluation when appropriate.  5. Alzheimer's dementia/ Neuropsych: This patient is not capable of making decisions on his own behalf. Continue Namenda  , discussed cognitive issues with patient's grandson 6. Skin/Wound Care: Pressure relief measures. Maintain adequate nutritional and hydration status.  7. Fluids/Electrolytes/Nutrition: Has been refusing intake--needs assistance/feeding for adequate intake. Added supplements (likes drinks cold)  to help with intake.  8. T2DM: Monitor BS ac/hs. Continue SSI.     CBG (last 3)  Recent Labs    08/02/17 1649 08/02/17 2057 08/03/17 0626  GLUCAP 119* 150* 119*  Controlled 3/4 9. HTN: Monitor BP bid--avoid hypotension but needs better control of diastolic bp. Continue Metoprolol XL.     Vitals:   08/02/17 1510 08/03/17  0644  BP:  (!) 148/97  Pulse: 84 99  Resp: 18 16  Temp: 98.4 F (36.9 C) 98.9 F (37.2 C)  SpO2:  98%  Controlled 3/3  10. CKD stage III: Monitor renal status. Push fluid intake.    Cr 1.38 on 2/27-at baseline   Cont to monitor 11. Dyslipidemia: now on Lipitor.   12. Morbid obesity             Body mass index is 38.22 kg/m.             Diet and exercise education             Encourage weight loss to increase endurance and promote overall health 13. Acute blood loss anemia   Hemoglobin 12.3 on 2/27 14.  Bowel incontinence this is likely related to his cognitive status, nursing working on achieving continence 15. Bladder: consider removing condom cath although it will likely need to more incontinence.      -d/w  team  LOS (Days) 6 A FACE TO FACE EVALUATION WAS PERFORMED  SWARTZ,ZACHARY T 08/03/2017 8:28 AM

## 2017-08-03 NOTE — Progress Notes (Signed)
Slept good during night. Condom cath to manage incontinence, but comes off several times, ? DC. Alfredo MartinezMurray, Sidonie Dexheimer A

## 2017-08-03 NOTE — Progress Notes (Signed)
Occupational Therapy Session Note  Patient Details  Name: Noah RumpfJames Depoy Jr. MRN: [id number] Date of Birth: 1941-03-11  Today's Date: 08/03/2017 OT Individual Time: [id number] OT Individual Time Calculation (min): 45 min    Short Term Goals: Week 1:  OT Short Term Goal 1 (Week 1): Pt will maintain sitting balance during functional ADL task with occassional min  A OT Short Term Goal 2 (Week 1): Pt will be able to track to midline to locate ADL item with cues OT Short Term Goal 3 (Week 1): Pt will demonstration initaition with slide board transfers mat <>w/c OT Short Term Goal 4 (Week 1): Pt will don shirt with mod A  OT Short Term Goal 5 (Week 1): Pt will roll toward the left with max A to A with ADLs at bed level  Skilled Therapeutic Interventions/Progress Updates:    Pt's nurse tech reported to this therapist that she attempted several times to have pt eat breakfast but he was too lethargic.  At the start of OT session, pt was awake and conversing with therapist.  Pt handed a washcloth but he was unable to grasp with either hand or bring to his face. With hand over hand guiding he used his R hand to wash face with 50 % assist.  Pt wanted a sip of water and needed hand over hand guiding to hold cup and bring to his mouth.  With RUE movements, his tone was rigid and he was not able to extend or flex elbow fully with active or passive ROM or elevate arm past 40 degrees.  Pt may have been guarding due to finger Fx and painful finger, but there was heavy resistance (purposeful vs. Tone?) throughout RUE.  Clothing started over pt's feet and UB and then pt needed to roll to pull pants up and shirt down.  Attempted several times to have pt flex R knee to roll L, but his leg was extremely rigid and unable to flex.  Attempted log rolling but unable to have pt reach right arm across body for bed rail even with total A or unable to turn head.  RN and NT assisted this therapist with rolling to fix clothing.  Pt was  not visually focusing on anything in either field. When standing on pt's L side, asked pt if he could see me. He stated yes but his head was fully turned to the R.  Manually rotated pt's head to the L and then pt said "oh yes now I see you".   Pt's initial eval reported that [facility] and he had L neglect.  Unclear if there has been a change in function. Reported to his main therapy team his functional status this morning.  His team will assess him to see if changes this am are transitional as it was just after pt woke up. Pt in room with RN.  Therapy Documentation Precautions:  Precautions Precautions: Fall Precaution Comments: severe left neglect Restrictions Weight Bearing Restrictions: No    Vital Signs: Therapy Vitals Temp: 98.9 F (37.2 C) Temp Source: Oral Pulse Rate: 84(radial) Resp: 16 BP: (!) 138/58 Patient Position (if appropriate): Lying Oxygen Therapy SpO2: 98 % O2 Device: Room Air Pain: Pain Assessment Pain Assessment: 0-10 Pain Score: 10-Worst pain ever Pain Type: Chronic pain Pain Location: Hand Pain Orientation: Right;Left ADL: ADL ADL Comments: see functional navigator     See Function Navigator for Current Functional Status.   Therapy/Group: Individual Therapy  Carver Murakami 08/03/2017, 9:39 AM

## 2017-08-03 NOTE — Progress Notes (Signed)
Occupational Therapy Session Note  Patient Details  Name: Bobby RumpfJames Louks Jr. MRN: [id number] Date of Birth: April 23, 1941  Today's Date: 08/03/2017 OT Individual Time: [id number] OT Individual Time Calculation (min): 46 min  and Today's Date: 08/03/2017 OT Missed Time: 29 Minutes Missed Time Reason: Patient fatigue   Short Term Goals: Week 1:  OT Short Term Goal 1 (Week 1): Pt will maintain sitting balance during functional ADL task with occassional min  A OT Short Term Goal 2 (Week 1): Pt will be able to track to midline to locate ADL item with cues OT Short Term Goal 3 (Week 1): Pt will demonstration initaition with slide board transfers mat <>w/c OT Short Term Goal 4 (Week 1): Pt will don shirt with mod A  OT Short Term Goal 5 (Week 1): Pt will roll toward the left with max A to A with ADLs at bed level  Skilled Therapeutic Interventions/Progress Updates:    Upon entering the room, pt supine in bed and very lethargic. Pt having difficulty remaining awake for any period of time. Pt unable to follow 1 step verbal commands this session. Pt required total A to orient to location,situation, and time. Pt rolling L <> R for placement of maxi move sling with +2 assistance. Maxi move utilized to place pt into tilt in space wheelchair. OT assisted pt to day room via total A in wheelchair. Pt unable to follow commands for AAROM for B UEs and was very resistive with PROM this session. Pt ultimately was unable to remain awake to participate this session. Quick release belt donned and pt places at [employer] station for safety.   Therapy Documentation Precautions:  Precautions Precautions: Fall Precaution Comments: severe left neglect Restrictions Weight Bearing Restrictions: No General: General OT Amount of Missed Time: 29 Minutes Vital Signs: Therapy Vitals Temp: 98.9 F (37.2 C) Temp Source: Oral Pulse Rate: 84(radial) Resp: 16 BP: (!) 138/58 Patient Position (if appropriate): Lying Oxygen  Therapy SpO2: 98 % O2 Device: Room Air Pain: Pain Assessment Pain Assessment: 0-10 Pain Score: 10-Worst pain ever Pain Type: Chronic pain Pain Location: Hand Pain Orientation: Right;Left ADL: ADL ADL Comments: see functional navigator  See Function Navigator for Current Functional Status.   Therapy/Group: Individual Therapy    Alen BleacherBradsher, Adylene Dlugosz P 08/03/2017, 10:34 AM

## 2017-08-03 NOTE — Progress Notes (Signed)
Physical Therapy Session Note  Patient Details  Name: Noah RumpfJames Mario Jr. MRN: [id number] Date of Birth: 1941/02/12  Today's Date: 08/03/2017 PT Individual Time: 1348-1500 PT Individual Time Calculation (min): 72 min   Short Term Goals: Week 1:  PT Short Term Goal 1 (Week 1): Pt will roll to L with modA with use of bed rails  PT Short Term Goal 2 (Week 1): Pt will perform transfers using LRAD with maxA +2 PT Short Term Goal 3 (Week 1): Pt will perform sit<>stand using LRAD with maxA +1 PT Short Term Goal 4 (Week 1): Pt will maintain sitting balance for 5 miutes while engaging in functional reaching task  Skilled Therapeutic Interventions/Progress Updates:    Pt seated in TIS w/c at nursing station upon PT arrival. Pt alert and responding to his name when called. Pt transported to gym total assist. Pt worked on attention and reaching with UEs to grasp wash cloth and wipe mouth/nose with L hand. Pt worked on reaching for cup to drink water, mod assist to maintain grasp on cup with L hand and bring straw to mouth. Therapist performed manual L cervical rotation stretches and ROM while attempting to get pt to attend to objects at midline. Pt performed sit<>stand with Huntley DecSara plus total assist +2, x 2 trials. Pt reports needing to use the bathroom. Pt transported back to room. Pt transferred from TIS>bed with maximove, total assist +2. Pt performed rolling in each direction x 2 with total assist +2, verbal and tactile cues to encourage pt to reach for bedrails. Pt incontinent of bowel and bladder, requiring total assist to clean peri area and change briefs/clothing. Pt left supine in bed with RN present at end of session.   Therapy Documentation Precautions:  Precautions Precautions: Fall Precaution Comments: severe left neglect Restrictions Weight Bearing Restrictions: No   See Function Navigator for Current Functional Status.   Therapy/Group: Individual Therapy  Cresenciano GenreEmily van Schagen, PT,  DPT 08/03/2017, 7:56 AM

## 2017-08-03 NOTE — Progress Notes (Signed)
Speech Language Pathology Daily Session Note  Patient Details  Name: Noah RumpfJames Manni Jr. MRN: [id number] Date of Birth: 10/16/1940  Today's Date: 08/03/2017 SLP Individual Time: 0930-1000 SLP Individual Time Calculation (min): 30 min  Short Term Goals: Week 1: SLP Short Term Goal 1 (Week 1): Pt will visually scan to midline in 25% of opportunities during basic, familiar tasks with max assist multimodal cues SLP Short Term Goal 2 (Week 1): Pt will sustain his attention to basic, familiar tasks for 1 minute with max verbal cues for redirection.  SLP Short Term Goal 3 (Week 1): Pt will utilize external aids to reorient to place, date, and situation with max assist multimodal cues.  SLP Short Term Goal 4 (Week 1): Pt will consume dys 1 textures and thin liquids with max cues for use of swallowing precautions and minimal overt s/s of aspiration.  Skilled Therapeutic Interventions:  Skilled treatment session focused on dysphagia and cognition goals.There was some concern that pt was experiencing neurological decline therefore SLP treated pt earlier in day. Nursing present. Pt was alert in bed with baseline right gaze preference and essentially no ability to track items to midline. Given Total A cues, he can track slightly but not to midline. With Total A cues, pt able to hold cup in his hand and consumed 8oz without overt s/s of aspiration. Pt required Total A for bringing hand to mouth for consumption of puree. Consumed without overt s/s of aspiration. Pt able to sustain attention to SLP for ~ 10 minutes with Max A cues. Pt with no neurological difference over previous session with this SLP on 08/01/17. Pt requires Total A to Max A for all activities. Pt left in nursing care.      Function:  Eating Eating   Modified Consistency Diet: Yes Eating Assist Level: Helper feeds patient;Help managing cup/glass;Helper brings food to mouth;Supervision or verbal cues;Set up assist for;More than reasonable amount of  time   Eating Set Up Assist For: Opening containers   Helper Brings Food to Mouth: Every scoop   Cognition Comprehension Comprehension assist level: Understands basic 25 - 49% of the time/ requires cueing 50 - 75% of the time;Understands basic 50 - 74% of the time/ requires cueing 25 - 49% of the time  Expression   Expression assist level: Expresses basic 25 - 49% of the time/requires cueing 50 - 75% of the time. Uses single words/gestures.  Social Interaction Social Interaction assist level: Interacts appropriately 50 - 74% of the time - May be physically or verbally inappropriate.  Problem Solving Problem solving assist level: Solves basic less than 25% of the time - needs direction nearly all the time or does not effectively solve problems and may need a restraint for safety  Memory Memory assist level: Recognizes or recalls less than 25% of the time/requires cueing greater than 75% of the time    Pain Pain Assessment Pain Assessment: 0-10 Pain Score: 10-Worst pain ever Pain Type: Chronic pain Pain Location: Hand Pain Orientation: Right;Left  Therapy/Group: Individual Therapy  Luchiano Viscomi 08/03/2017, 12:58 PM

## 2017-08-04 ENCOUNTER — Inpatient Hospital Stay (HOSPITAL_COMMUNITY): Payer: Medicare Other | Admitting: Physical Therapy

## 2017-08-04 ENCOUNTER — Inpatient Hospital Stay (HOSPITAL_COMMUNITY): Payer: Medicare Other

## 2017-08-04 LAB — CBC
HCT: 35.6 % — ABNORMAL LOW (ref 39.0–52.0)
Hemoglobin: 11.9 g/dL — ABNORMAL LOW (ref 13.0–17.0)
MCH: 27.5 pg (ref 26.0–34.0)
MCHC: 33.4 g/dL (ref 30.0–36.0)
MCV: 82.2 fL (ref 78.0–100.0)
PLATELETS: 336 10*3/uL (ref 150–400)
RBC: 4.33 MIL/uL (ref 4.22–5.81)
RDW: 14.2 % (ref 11.5–15.5)
WBC: 9.8 10*3/uL (ref 4.0–10.5)

## 2017-08-04 LAB — GLUCOSE, CAPILLARY
GLUCOSE-CAPILLARY: 174 mg/dL — AB (ref 65–99)
Glucose-Capillary: 140 mg/dL — ABNORMAL HIGH (ref 65–99)
Glucose-Capillary: 166 mg/dL — ABNORMAL HIGH (ref 65–99)
Glucose-Capillary: 103 mg/dL — ABNORMAL HIGH (ref 65–99)

## 2017-08-04 LAB — BASIC METABOLIC PANEL
Anion gap: 10 (ref 5–15)
BUN: 44 mg/dL — AB (ref 6–20)
CALCIUM: 9.2 mg/dL (ref 8.9–10.3)
CHLORIDE: 106 mmol/L (ref 101–111)
CO2: 21 mmol/L — ABNORMAL LOW (ref 22–32)
CREATININE: 1.31 mg/dL — AB (ref 0.61–1.24)
GFR calc Af Amer: 59 mL/min — ABNORMAL LOW (ref >60)
GFR, EST NON AFRICAN AMERICAN: 51 mL/min — AB (ref >60)
Glucose, Bld: 198 mg/dL — ABNORMAL HIGH (ref 65–99)
Potassium: 3.9 mmol/L (ref 3.5–5.1)
Sodium: 137 mmol/L (ref 135–145)

## 2017-08-04 MED ORDER — GUAIFENESIN-DM 100-10 MG/5ML PO SYRP
5.0000 mL | ORAL_SOLUTION | Freq: Three times a day (TID) | ORAL | Status: DC
  Administered 2017-08-04 – 2017-08-11 (×23): 10 mL via ORAL
  Filled 2017-08-04 (×23): qty 10

## 2017-08-04 MED ORDER — METOPROLOL SUCCINATE ER 25 MG PO TB24
25.0000 mg | ORAL_TABLET | Freq: Every day | ORAL | Status: DC
  Administered 2017-08-05 – 2017-08-08 (×4): 25 mg via ORAL
  Filled 2017-08-04 (×4): qty 1

## 2017-08-04 NOTE — Progress Notes (Signed)
Recreational Therapy Session Note  Patient Details  Name: Noah RumpfJames Brister Jr. MRN: [id number] Date of Birth: Oct 03, 1940 Today's Date: 08/04/2017   Eval deferred, pt is not yet appropriate for TR services.  Will continue to monitor through team for future participation. Noah Bush 08/04/2017, 4:19 PM

## 2017-08-04 NOTE — Progress Notes (Signed)
Physical Therapy Session Note  Patient Details  Name: Noah RumpfJames Woodle Jr. MRN: [id number] Date of Birth: 03/21/1941  Today's Date: 08/04/2017 PT Individual Time: 1430-1545 PT Individual Time Calculation (min): 75 min   Short Term Goals: Week 1:  PT Short Term Goal 1 (Week 1): Pt will roll to L with modA with use of bed rails  PT Short Term Goal 2 (Week 1): Pt will perform transfers using LRAD with maxA +2 PT Short Term Goal 3 (Week 1): Pt will perform sit<>stand using LRAD with maxA +1 PT Short Term Goal 4 (Week 1): Pt will maintain sitting balance for 5 miutes while engaging in functional reaching task  Skilled Therapeutic Interventions/Progress Updates: Pt received supine in bed, evidence of pain in R hand throughout session but pt unable to rate. TotalA rolling R/L to doff wet brief after condom catheter had fallen off and incontinent bowel movement, don pants and brief and perform hygiene totalA. Supine>sit with HOB elevated and totalA +2. Lateral scoot transfers on transfer board with totalA +2 bed>w/c>mat table to facilitate anterior weight shifting and progress hips. Minimal activation/assist from patient throughout with multimodal cueing. In sitting, pt demonstrates standbyA sitting balance with improving alignment of pelvis and shoulders. Unwilling to weight bear to R elbow as he reports it is painful. Attempted to engage pt in reaching activities to R side for initiation and R lateral leaning to reorient to midline, however significantly reduced initiation and attention to task. Sit <>stand "three muskateers" x3 trials with maxA +2, improved with repetition. In standing, attempted to put BUEs on eva walker, however poor postural control and alignment with RLE pushing and decreased activation through LLE in standing. Stand pivot three muskateers to L side with maxA +2, pt assists some with progressing LLE however began sitting prior to completing full turn. Remained semi-reclined in w/c at end of  session, quick release belt intact and seated at nurses station for supervision.      Therapy Documentation Precautions:  Precautions Precautions: Fall Precaution Comments: severe left neglect Restrictions Weight Bearing Restrictions: No   See Function Navigator for Current Functional Status.   Therapy/Group: Individual Therapy  Vista Lawmanlizabeth J Tygielski 08/04/2017, 4:36 PM

## 2017-08-04 NOTE — Progress Notes (Signed)
Speech Language Pathology Daily Session Note  Patient Details  Name: Noah RumpfJames Scerbo Jr. MRN: [id number] Date of Birth: 1940/07/25  Today's Date: 08/04/2017 SLP Individual Time: 1100-1200 SLP Individual Time Calculation (min): 60 min  Short Term Goals: Week 1: SLP Short Term Goal 1 (Week 1): Pt will visually scan to midline in 25% of opportunities during basic, familiar tasks with max assist multimodal cues SLP Short Term Goal 2 (Week 1): Pt will sustain his attention to basic, familiar tasks for 1 minute with max verbal cues for redirection.  SLP Short Term Goal 3 (Week 1): Pt will utilize external aids to reorient to place, date, and situation with max assist multimodal cues.  SLP Short Term Goal 4 (Week 1): Pt will consume dys 1 textures and thin liquids with max cues for use of swallowing precautions and minimal overt s/s of aspiration.  Skilled Therapeutic Interventions:Skilled ST services focused on swallow and cognitive skills. SLP facilitated oral care providing total A and required Max A verbal/visual cues to following directions,spitting during oral care. Pt demonstrated poor sustained attention requring max A verbal and tactile cues in 3 minute intervals. SLP facilitated orientation to place, situation and time requiring max A verbal and visual cues, attempting spaced retrieval task to aid in recall/retesion of information, however lacked carryover. SLP facilitated identification of object and function utilizing LARK to engage sustained attention and scanning to midline in 1 out 10 opportunities with Max A verbal and visual tracking cues. SLP facilitated PO consumption of lunch tray, dys 1 and thin liquid, pt demonstrated minimum intake with Max A verbal cues and required Max A verbal cues to slow rate during straw consumption resulting in 1 s/s aspiration noted. SLP informed nurse tech to finish meal with pt and encourage PO consumption. Pt was left in room with call bell within reach.  Reccomend to continue skilled ST services.      Function:  Eating Eating   Modified Consistency Diet: Yes Eating Assist Level: Helper feeds patient;Help managing cup/glass;Helper brings food to mouth;Supervision or verbal cues;Set up assist for;More than reasonable amount of time   Eating Set Up Assist For: Opening containers   Helper Brings Food to Mouth: Every scoop   Cognition Comprehension Comprehension assist level: Understands basic 25 - 49% of the time/ requires cueing 50 - 75% of the time;Understands basic 50 - 74% of the time/ requires cueing 25 - 49% of the time  Expression   Expression assist level: Expresses basic 25 - 49% of the time/requires cueing 50 - 75% of the time. Uses single words/gestures.  Social Interaction Social Interaction assist level: Interacts appropriately 50 - 74% of the time - May be physically or verbally inappropriate.  Problem Solving Problem solving assist level: Solves basic less than 25% of the time - needs direction nearly all the time or does not effectively solve problems and may need a restraint for safety  Memory Memory assist level: Recognizes or recalls less than 25% of the time/requires cueing greater than 75% of the time    Pain Pain Assessment Pain Assessment: No/denies pain  Therapy/Group: Individual Therapy  Chau Sawin  [facility] 08/04/2017, 4:08 PM

## 2017-08-04 NOTE — Progress Notes (Signed)
[facility] PHYSICAL MEDICINE & REHABILITATION     PROGRESS NOTE  Subjective/Complaints:   Complains of cough but no shortness of breath.  States that he slept well last night..  ROS: pt denies nausea, vomiting, diarrhea, cough, shortness of breath or chest pain    Objective: Vital Signs: Blood pressure 131/71, pulse 71, temperature 98.1 F (36.7 C), temperature source Oral, resp. rate 18, height 5\' 11"  (1.803 m), weight 122.9 kg (270 lb 15.1 oz), SpO2 98 %. Dg Foot 2 Views Right  Result Date: 08/02/2017 CLINICAL DATA:  Great toe pain. EXAM: RIGHT FOOT - 2 VIEW COMPARISON:  None. FINDINGS: No acute fracture or dislocation. Severe osteopenia. 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 No other soft tissue abnormality. IMPRESSION: No acute osseous injury of the right foot. Electronically Signed   By: Elige Ko   On: 08/02/2017 12:47   No results for input(s): WBC, HGB, HCT, PLT in the last 72 hours. No results for input(s): NA, K, CL, GLUCOSE, BUN, CREATININE, CALCIUM in the last 72 hours.  Invalid input(s): CO CBG (last 3)  Recent Labs    08/03/17 1648 08/03/17 2136 08/04/17 0651  GLUCAP 117* 195* 140*    Wt Readings from Last 3 Encounters:  07/28/17 122.9 kg (270 lb 15.1 oz)  07/23/17 124.3 kg (274 lb)  06/29/17 124.3 kg (274 lb)    Physical Exam:  BP 131/71 (BP Location: Left Arm)   Pulse 71   Temp 98.1 F (36.7 C) (Oral)   Resp 18   Ht 5\' 11"  (1.803 m)   Wt 122.9 kg (270 lb 15.1 oz)   SpO2 98%   BMI 37.79 kg/m  Constitutional: He appears well-developed. He is sleeping and cooperative. Obese  HENT: Normocephalic and atraumatic.  Eyes: No discharge Ongoing. Dysconjugate gaze with right eye fixed to right lateral field. Right gaze preference  Neck: No JVD  Cardiovascular: RRR without murmur. No JVD    Respiratory: Normal effort, no abnormal breath sounds but not moving a lot of air during inspiration and expiration.   GI: Bowel sounds are normal. He exhibits no distension.  Musculoskeletal: He exhibits right middle finger with splint, taped to the right ring finger. --Stable  Neurological:   A&Ox3 with significant delay in processing Left facial weakness with moderate to severe dysarthria.   Voice generally stronger.  Able to follow simple motor commands with max cues.  Motor: RUE 3+/5 proximal to distal with trace tone RLE 2+/5 proximal to distal LUE: 2+5 proximal to distal LLE: 3- to 3/5 proximal to distal --motor and sensory exam stable Skin: Skin is warm and dry.  Well healed old incisions left knee with decreased ROM.   Psychiatric: blunt affect, delayed     Assessment/Plan: 1. 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 Physiatrist is providing close team supervision and 24 hour management of active medical problems listed below. Physiatrist and rehab team continue to assess barriers to discharge/monitor patient progress toward functional and medical goals.  Function:  Bathing Bathing position   Position: Bed  Bathing parts   Body parts bathed by helper: Right arm, Left arm, Chest, Abdomen, Front perineal area, Buttocks, Right upper leg, Left upper leg, Right lower leg, Left lower leg, Back  Bathing assist Assist Level: 2 helpers      Upper Body Dressing/Undressing Upper body dressing  What is the patient wearing?: Pull over shirt/dress       Pull over shirt/dress - Perfomed by helper: Thread/unthread right sleeve, Thread/unthread left sleeve, Put head through opening, Pull shirt over trunk        Upper body assist Assist Level: 2 helpers      Lower Body Dressing/Undressing Lower body dressing   What is the patient wearing?: Pants, Non-skid slipper socks       Pants- Performed by helper: Thread/unthread  right pants leg, Thread/unthread left pants leg, Pull pants up/down   Non-skid slipper socks- Performed by helper: Don/doff right sock, Don/doff left sock                  Lower body assist Assist for lower body dressing: 2 Helpers      Toileting Toileting Toileting activity did not occur: No continent bowel/bladder event   Toileting steps completed by helper: Adjust clothing prior to toileting, Performs perineal hygiene, Adjust clothing after toileting    Toileting assist Assist level: Two helpers(four)   Transfers Chair/bed transfer   Chair/bed transfer method: Other Chair/bed transfer assist level: 2 helpers Chair/bed transfer assistive device: Mechanical lift Mechanical lift: Maximove   Locomotion Ambulation Ambulation activity did not occur: Safety/medical concerns   Max distance: 8360' Assist level: 2 helpers   Wheelchair   Type: Manual   Assist Level: Dependent (Pt equals 0%)(dependently transported to gym)  Cognition Comprehension Comprehension assist level: Understands basic 25 - 49% of the time/ requires cueing 50 - 75% of the time, Understands basic 50 - 74% of the time/ requires cueing 25 - 49% of the time  Expression Expression assist level: Expresses basic 25 - 49% of the time/requires cueing 50 - 75% of the time. Uses single words/gestures.  Social Interaction Social Interaction assist level: Interacts appropriately 50 - 74% of the time - May be physically or verbally inappropriate.  Problem Solving Problem solving assist level: Solves basic less than 25% of the time - needs direction nearly all the time or does not effectively solve problems and may need a restraint for safety  Memory Memory assist level: Recognizes or recalls less than 25% of the time/requires cueing greater than 75% of the time    Medical Problem List and Plan: 1.  Lethargy, left oral motor weakness, dysphagia, severe left neglect with pusher tendency secondary to large acute on subacute  R-MCA territory infarct with petechial hemorrhage.   CIR PT OT speech ongoing   = Team conference today 2.  DVT Prophylaxis/Anticoagulation: start  Pharmaceutical: Lovenox 3. Pain Management: tylenol prn. Added Sportscreme to help with joint stiffness bilateral hands and left knee.    -Right hand middle finger pain, base of fracture nondisplaced    = splint, Ortho follow-up after hospitalization   -Right first MTP pain likely gout. xrays negative 4. Mood: Team to provide encouragement/ego support. LCSW to follow for evaluation when appropriate.  5. Alzheimer's dementia/ Neuropsych: This patient is not capable of making decisions on his own behalf. Continue Namenda  6. Skin/Wound Care: Pressure relief measures. Maintain adequate nutritional and hydration status.  7. Fluids/Electrolytes/Nutrition: Has been refusing intake--needs assistance/feeding for adequate intake. Added supplements (likes drinks cold)  to help with intake.  8. T2DM: Monitor BS ac/hs. Continue SSI.    -Add carb modified restrictions to diet, however worried that this will further decrease his intake.  Observe for now CBG (last 3)  Recent Labs    08/03/17 1648 08/03/17 2136 08/04/17 0651  GLUCAP 117*  195* 140*   9. HTN: Monitor BP bid--avoid hypotension but needs better control of diastolic bp.      Vitals:   08/03/17 1539 08/04/17 0243  BP: (!) 138/102 131/71  Pulse: 72 71  Resp: 16 18  Temp: 98.6 F (37 C) 98.1 F (36.7 C)  SpO2: 95% 98%  Blood pressures have been somewhat labile, particularly diastolic pressures.  Increase metoprolol XL to 25 mg daily  10. CKD stage III: Monitor renal status. Push fluid intake.    Cr 1.38 on 2/27-at baseline   Cont to monitor 11. Dyslipidemia: now on Lipitor.   12. Morbid obesity             Body mass index is 38.22 kg/m.             Diet and exercise education               13. Acute blood loss anemia   Hemoglobin 12.3 on 2/27 14.  Bowel incontinence this is likely  related to his cognitive status, nursing working on achieving continence 15. Bladder: consider removing condom cath although it will likely need to more incontinence.      -d/w team again today 16. Cough: Suspect this is related to dysphagia.  No abnormal breath sounds on exam.  Vital signs are stable   -Continue Robitussin but schedule   -Aspiration precautions  -Continue to observe closely clinically  LOS (Days) 7 A FACE TO FACE EVALUATION WAS PERFORMED  Jalicia Roszak T 08/04/2017 8:30 AM

## 2017-08-04 NOTE — Patient Care Conference (Signed)
Inpatient RehabilitationTeam Conference and Plan of Care Update Date: 08/04/2017   Time: 2:00 PM    Patient Name: Noah RumpfJames Hudock Jr.      Medical Record Number: [id number]  Date of Birth: November 09, 1940 Sex: Male         Room/Bed: 4W05C/4W05C-01 Payor Info: Payor: [employer] MEDICARE / Plan: [employer] MEDICARE / Product Type: *No Product type* /    Admitting Diagnosis: CVA  Admit Date/Time:  07/28/2017  6:29 PM Admission Comments: No comment available   Primary Diagnosis:  <principal problem not specified> Principal Problem: <principal problem not specified>  Patient Active Problem List   Diagnosis Date Noted  . Acute blood loss anemia   . Benign essential HTN   . 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 Acute ischemic right MCA stroke (HCC) 07/28/2017  . Diabetes mellitus type 2 in obese (HCC)   . Stage 3 chronic kidney disease (HCC)   . Morbid obesity (HCC)   . Cerebral infarction (HCC) 07/27/2017  . Hyperlipidemia   . Essential hypertension   . 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 Asymmetry of cerebral ventricles, Acute, Acquired  07/23/2017  . Encounter for screening colonoscopy 03/12/2015    Expected Discharge Date: Expected Discharge Date: (4 weeks)  Team Members Present: Physician leading conference: Dr. Faith RogueZachary Swartz Social Worker Present: Staci AcostaJenny Zeph Riebel, LCSW Nurse Present: Ronny BaconWhitney Reardon, RN PT Present: Alyson ReedyElizabeth Tygielski, PT OT Present: Ardis Rowanom Lanier, COTA;Lujean Ebright Katrinka BlazingSmith, OT SLP Present: Colin BentonMadison Cratch, SLP PPS Coordinator present : Edson SnowballBecky Windsor, PT     Current Status/Progress Goal Weekly Team Focus  Medical   Patient with history of Alzheimer's dementia and chronic kidney disease who was admitted for a right MCA infarct.  Ongoing issues with dysphagia as well as diabetes.  Optimize medical condition to allow participation in therapy  Treatment of ongoing blood pressure pulmonary renal and diabetic issues   Bowel/Bladder   incontinent  of b/b, using condom cath due to MASD and healing for peeled/abrasions to peri area, LBM 3/4  b/b with total assist   continue to monitor b/b q shift and prn, assess for need for condom cath and d/c as appropriate.   Swallow/Nutrition/ Hydration   dys 1 and thin, total A  Mod A  increase PO intake, attempting self feeding    ADL's   total A of 2 for all self care, bed mobility, sit balance; uses maxilift, decreased functional use in all extremities, limited vision with L neglect  mod A overall  functional use of UEs, bed mobility, balance, visual scanning   Mobility   totalA bed mobility, totalA transfers using slideboard/maxi move, max/totalA sitting balance, gait up to 5360ft using sara plus  min A sitting balance, mod A standing balance, modA bed mobility/transfers, minA W/C mobility  bed mobility, transfer training, activity tolerance, sitting balance    Communication             Safety/Cognition/ Behavioral Observations  Total- Max A   Mod A   sustained attention, basic problem solving, orientation/intellectual awarness   Pain   pt grimaces in pain r/t right hand and left shoulder, will not ask for pain meds  pain <2 on faces scale  monitor q shift and prn, treat pain accordingly   Skin   MASD to bottom, abdominal fold and groin, buttocks with healing abrasions, some areas of skin still have appearance of a layer of skin peeled off.  treat MASD with total assist, keep skin clean and dry as possible  due to incontinence.  monitor q shift and prn    Rehab Goals Patient on target to meet rehab goals: Yes Rehab Goals Revised: none *See Care Plan and progress notes for long and short-term goals.     Barriers to Discharge  Current Status/Progress Possible Resolutions Date Resolved   Physician    Medical stability  Baseline cognitive deficits     Continue treating issues above.  Involvement of family and discharge planning and training      Nursing                  PT                     OT                  SLP                SW                Discharge Planning/Teaching Needs:  Pt to return home with son and grandson to provide physical care.  Family education to be completed closer to d/c with pt's son and grandson.   Team Discussion:  Pt with R MCA infarct and baseline dementia.  Dr. Riley Kill is currently monitoring pt's cough/secretions.  Pt with gout and fractured finger (now buddy taped by OT).  Pt is incontinent of bowel and uses condom catheter.  ST has trouble getting pt to eat much, usually about 5 bites.  On D1, thin liquids diet.  Attention is very poor and pt needs max cues for swallowing. In addition to attention deficits and inattention, pt with motor apraxia per OT and the more hands on A you give him, the more he resists.  Pt is max to total A.  Pt has min A w/c level PT goals, but has not been in a manual chair yet.     Revisions to Treatment Plan:  none    Continued Need for Acute Rehabilitation Level of Care: The patient requires daily medical management by a physician with specialized training in physical medicine and rehabilitation for the following conditions: Daily direction of a multidisciplinary physical rehabilitation program to ensure safe treatment while eliciting the highest outcome that is of practical value to the patient.: Yes Daily medical management of patient stability for increased activity during participation in an intensive rehabilitation regime.: Yes Daily analysis of laboratory values and/or radiology reports with any subsequent need for medication adjustment of medical intervention for : Neurological problems;Diabetes problems;Renal problems  Demeco Ducksworth, [place] Deck 08/04/2017, 2:26 PM

## 2017-08-04 NOTE — Progress Notes (Signed)
Occupational Therapy Session Note  Patient Details  Name: Bobby RumpfJames Castronova Jr. MRN: [id number] Date of Birth: 03-15-1941  Today's Date: 08/04/2017 OT Individual Time: 0930-1030 OT Individual Time Calculation (min): 60 min    Short Term Goals: Week 1:  OT Short Term Goal 1 (Week 1): Pt will maintain sitting balance during functional ADL task with occassional min  A OT Short Term Goal 2 (Week 1): Pt will be able to track to midline to locate ADL item with cues OT Short Term Goal 3 (Week 1): Pt will demonstration initaition with slide board transfers mat <>w/c OT Short Term Goal 4 (Week 1): Pt will don shirt with mod A  OT Short Term Goal 5 (Week 1): Pt will roll toward the left with max A to A with ADLs at bed level  Skilled Therapeutic Interventions/Progress Updates:    Pt resting in bed upon arrival and acknowledged therapist but unable to rotate head to his L past midline.  Pt unable to move eyes past midline.  Pt oriented to place [facility] but continued to talk about lady who was telling him to load his truck.  OT intervention with focus on dressing tasks at bed level.  Pt incontinent of bowel and unaware.  Pt required tot A + 2 for hygiene and all dressing tasks this morning.  Pt required max multimodal cues for all bed mobility.  Pt with increased lethargy during later part of therapy and required max verbal cues to keep eyes open.  Pt remained in bed with all needs within reach and bed alarm activated.   Therapy Documentation Precautions:  Precautions Precautions: Fall Precaution Comments: severe left neglect Restrictions Weight Bearing Restrictions: No   Pain:  Pt denies pain this morning.  See Function Navigator for Current Functional Status.   Therapy/Group: Individual Therapy  Rich BraveLanier, Caidon Foti Chappell 08/04/2017, 12:05 PM

## 2017-08-05 ENCOUNTER — Inpatient Hospital Stay (HOSPITAL_COMMUNITY): Payer: Medicare Other | Admitting: Physical Therapy

## 2017-08-05 ENCOUNTER — Inpatient Hospital Stay (HOSPITAL_COMMUNITY): Payer: Medicare Other | Admitting: Speech Pathology

## 2017-08-05 ENCOUNTER — Inpatient Hospital Stay (HOSPITAL_COMMUNITY): Payer: Medicare Other | Admitting: Occupational Therapy

## 2017-08-05 LAB — GLUCOSE, CAPILLARY
GLUCOSE-CAPILLARY: 157 mg/dL — AB (ref 65–99)
GLUCOSE-CAPILLARY: 142 mg/dL — AB (ref 65–99)
GLUCOSE-CAPILLARY: 151 mg/dL — AB (ref 65–99)
Glucose-Capillary: 105 mg/dL — ABNORMAL HIGH (ref 65–99)

## 2017-08-05 MED ORDER — SODIUM CHLORIDE 0.45 % IV SOLN
INTRAVENOUS | Status: DC
  Administered 2017-08-05 – 2017-08-11 (×7): via INTRAVENOUS

## 2017-08-05 NOTE — Progress Notes (Signed)
Occupational Therapy Session Note  Patient Details  Name: Noah RumpfJames Bolls Jr. MRN: [id number] Date of Birth: January 26, 1941  Today's Date: 08/05/2017 OT Individual Time: 1100-1200 OT Individual Time Calculation (min): 60 min    Short Term Goals: Week 2:  OT Short Term Goal 1 (Week 2): Pt will maintain sitting balance during functional ADL task with occassional min  A OT Short Term Goal 2 (Week 2): Pt will be able to track to midline to locate ADL item with cues OT Short Term Goal 3 (Week 2): Pt will don shirt with mod A  OT Short Term Goal 4 (Week 2): Pt will demonstration initaition with slide board transfers mat <>w/c OT Short Term Goal 5 (Week 2): Pt will roll toward the left with max A to A with ADLs at bed level  Skilled Therapeutic Interventions/Progress Updates:    Pt resting in bed upon arrival.  Per PT report pt had been incontinent of bowel during earlier session.  Pt continued with this trend and was incontinent of bowel with position changes.  OT intervention with focus on following one step commands, attention to L, active participation in bathing tasks, and safety awareness.  Pt engaged in bathing tasks at shower level.  Pt transferred to rolling shower chair with maxi lift and +2. Pt initiated bathing abdomen and chest with min verbal cues.  Pt attempted to bathe BUE but required assistance to complete task.  Pt stood with Huntley DecSara Plus to don brief before transferring to bed.  Pt required max verbal cues to rotate head to midline.  Pt did rotate head slightly past midline for approx 2 seconds when attempting to locate item on his L.  Pt remained in bed on his L side to facilitate head rotation to his L. All needs within reach.   Therapy Documentation Precautions:  Precautions Precautions: Fall Precaution Comments: severe left neglect Restrictions Weight Bearing Restrictions: No Pain:  Pt c/o tenderness of 3 digit R hand; RN aware  See Function Navigator for Current Functional  Status.   Therapy/Group: Individual Therapy  Rich BraveLanier, Herley Bernardini Chappell 08/05/2017, 1:42 PM

## 2017-08-05 NOTE — Progress Notes (Signed)
Occupational Therapy Weekly Progress Note  Patient Details  Name: Noah Bush. MRN: [id number] Date of Birth: 11/07/1940  Beginning of progress report period: July 29, 2017 End of progress report period: August 05, 2017  Patient has met 0 of 5 short term goals.  Pt progress has been minimal during the past week.  Pt currently requires tot A + 2 for bed mobility and squat pivot transfers/slide board transfers.  Pt requires max verbal cues to rotate his head to midline and bring his eyes to midline.  Pt requires max multimodal cues to initiate functional tasks such as donning pullover shirt.  Although pt is oriented to place (hospital) his conversation consistently references "conversations" he has with "the lady" about loading his truck. Pt requires min A/max A for static sitting balance and max A for dynamic sitting balance.  Pt currently requires max A/tot A for dressing tasks and is on night bath by nursing staff.  Patient continues to demonstrate the following deficits: muscle weakness, decreased cardiorespiratoy endurance, impaired timing and sequencing, abnormal tone, unbalanced muscle activation, motor apraxia, decreased coordination and decreased motor planning, decreased visual acuity, decreased visual perceptual skills and decreased visual motor skills, decreased midline orientation, decreased attention to left, left side neglect and decreased motor planning, decreased initiation, decreased attention, decreased awareness, decreased problem solving, decreased safety awareness, decreased memory and delayed processing and decreased sitting balance, decreased standing balance, decreased postural control, hemiplegia, decreased balance strategies and difficulty maintaining precautions and therefore will continue to benefit from skilled OT intervention to enhance overall performance with BADL and Reduce care partner burden.  Patient progressing toward long term goals..  Continue plan of  care.  OT Short Term Goals Week 1:  OT Short Term Goal 1 (Week 1): Pt will maintain sitting balance during functional ADL task with occassional min  A OT Short Term Goal 1 - Progress (Week 1): Progressing toward goal OT Short Term Goal 2 (Week 1): Pt will be able to track to midline to locate ADL item with cues OT Short Term Goal 2 - Progress (Week 1): Progressing toward goal OT Short Term Goal 3 (Week 1): Pt will demonstration initaition with slide board transfers mat <>w/c OT Short Term Goal 3 - Progress (Week 1): Progressing toward goal OT Short Term Goal 4 (Week 1): Pt will don shirt with mod A  OT Short Term Goal 4 - Progress (Week 1): Progressing toward goal OT Short Term Goal 5 (Week 1): Pt will roll toward the left with max A to A with ADLs at bed level OT Short Term Goal 5 - Progress (Week 1): Progressing toward goal Week 2:  OT Short Term Goal 1 (Week 2): Pt will maintain sitting balance during functional ADL task with occassional min  A OT Short Term Goal 2 (Week 2): Pt will be able to track to midline to locate ADL item with cues OT Short Term Goal 3 (Week 2): Pt will don shirt with mod A  OT Short Term Goal 4 (Week 2): Pt will demonstration initaition with slide board transfers mat <>w/c OT Short Term Goal 5 (Week 2): Pt will roll toward the left with max A to A with ADLs at bed level       Therapy Documentation Precautions:  Precautions Precautions: Fall Precaution Comments: severe left neglect Restrictions Weight Bearing Restrictions: No    See Function Navigator for Current Functional Status.     Leotis Shames [facility] 08/05/2017, 6:51 AM

## 2017-08-05 NOTE — Progress Notes (Addendum)
Noah Bush PHYSICAL MEDICINE & REHABILITATION     PROGRESS NOTE  Subjective/Complaints:   Resting fairly comfortably. Doesn't offer much today  ROS: Limited due to cognitive/behavioral   Objective: Vital Signs: Blood pressure 125/82, pulse 66, temperature 98.1 F (36.7 C), temperature source Oral, resp. rate 20, height 5\' 11"  (1.803 m), weight 123 kg (271 lb 2.7 oz), SpO2 99 %. No results found. Recent Labs    08/04/17 1100  WBC 9.8  HGB 11.9*  HCT 35.6*  PLT 336   Recent Labs    08/04/17 1100  NA 137  K 3.9  CL 106  GLUCOSE 198*  BUN 44*  CREATININE 1.31*  CALCIUM 9.2   CBG (last 3)  Recent Labs    08/04/17 1650 08/04/17 2109 08/05/17 0623  GLUCAP 174* 103* 105*    Wt Readings from Last 3 Encounters:  08/05/17 123 kg (271 lb 2.7 oz)  07/23/17 124.3 kg (274 lb)  06/29/17 124.3 kg (274 lb)    Physical Exam:  BP 125/82 (BP Location: Left Arm)   Pulse 66   Temp 98.1 F (36.7 C) (Oral)   Resp 20   Ht 5\' 11"  (1.803 m)   Wt 123 kg (271 lb 2.7 oz)   SpO2 99%   BMI 37.82 kg/m  Constitutional: He appears well-developed. He is sleeping and cooperative. Obese  HENT: Normocephalic and atraumatic.  Eyes: No discharge Ongoing. Dysconjugate gaze with right eye fixed to right lateral field. Right gaze preference  Neck: supple  Cardiovascular: RRR without murmur. No JVD     Respiratory:CTA Bilaterally without wheezes or rales. Normal effort  GI: Bowel sounds are normal. He exhibits no distension.  Musculoskeletal: He exhibits right middle finger with splint, taped to the right ring finger. -no changes  -left knee with limited ROM  Neurological:   A&Ox3 with significant delay in processing Left facial weakness with moderate to severe dysarthria.   Voice generally stronger.  Able to follow simple motor commands with max cues.  Motor: RUE 3+/5 proximal to distal with trace tone RLE 2+/5 proximal to distal LUE: 2+5 proximal to distal LLE: 3- to 3/5 proximal to  distal --motor exam unchanged, effort not consistent Skin: Skin is warm and dry.    Psychiatric: blunt affect, slow    Assessment/Plan: 1. 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 Physiatrist is providing close team supervision and 24 hour management of active medical problems listed below. Physiatrist and rehab team continue to assess barriers to discharge/monitor patient progress toward functional and medical goals.  Function:  Bathing Bathing position   Position: Bed  Bathing parts   Body parts bathed by helper: Right arm, Left arm, Chest, Abdomen, Front perineal area, Buttocks, Right upper leg, Left upper leg, Right lower leg, Left lower leg, Back  Bathing assist Assist Level: 2 helpers      Upper Body Dressing/Undressing Upper body dressing   What is the patient wearing?: Pull over shirt/dress       Pull over shirt/dress - Perfomed by helper: Thread/unthread right sleeve, Thread/unthread left sleeve, Put head through opening, Pull shirt over trunk        Upper body assist Assist Level: 2 helpers      Lower Body Dressing/Undressing Lower body dressing   What is the patient wearing?: Pants, Non-skid slipper socks       Pants- Performed by  helper: Thread/unthread right pants leg, Thread/unthread left pants leg, Pull pants up/down   Non-skid slipper socks- Performed by helper: Don/doff right sock, Don/doff left sock                  Lower body assist Assist for lower body dressing: 2 Helpers      Toileting Toileting Toileting activity did not occur: No continent bowel/bladder event   Toileting steps completed by helper: Adjust clothing prior to toileting, Performs perineal hygiene, Adjust clothing after toileting    Toileting assist Assist level: Two helpers   Transfers Chair/bed transfer    Chair/bed transfer method: Lateral scoot Chair/bed transfer assist level: 2 helpers Chair/bed transfer assistive device: Armrests, Sliding board Mechanical lift: Maximove   Locomotion Ambulation Ambulation activity did not occur: Safety/medical concerns   Max distance: 61' Assist level: 2 helpers   Wheelchair   Type: Manual   Assist Level: Dependent (Pt equals 0%)(dependently transported to gym)  Cognition Comprehension Comprehension assist level: Understands basic 25 - 49% of the time/ requires cueing 50 - 75% of the time, Understands basic 50 - 74% of the time/ requires cueing 25 - 49% of the time  Expression Expression assist level: Expresses basic 25 - 49% of the time/requires cueing 50 - 75% of the time. Uses single words/gestures.  Social Interaction Social Interaction assist level: Interacts appropriately 50 - 74% of the time - May be physically or verbally inappropriate.  Problem Solving Problem solving assist level: Solves basic less than 25% of the time - needs direction nearly all the time or does not effectively solve problems and may need a restraint for safety  Memory Memory assist level: Recognizes or recalls less than 25% of the time/requires cueing greater than 75% of the time    Medical Problem List and Plan: 1.  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   CIR PT OT speech ongoing   = consider 15hr/7 day therapy 2.  DVT Prophylaxis/Anticoagulation: start  Pharmaceutical: Lovenox 3. Pain Management: tylenol prn. Added Sportscreme to help with joint stiffness bilateral hands and left knee.    -Right hand middle finger pain, base of fracture nondisplaced    = splint, Ortho follow-up after hospitalization   -Right first MTP pain likely gout. xrays negative 4. Mood: Team to provide encouragement/ego support. LCSW to follow for evaluation  when appropriate.  5. Alzheimer's dementia/ Neuropsych: This patient is not capable of making decisions on his own behalf. Continue Namenda  6. Skin/Wound Care: Pressure relief measures. Maintain adequate nutritional and hydration status.  7. Fluids/Electrolytes/Nutrition: Has been refusing intake--needs assistance/feeding for adequate intake. Added supplements (likes drinks cold)  to help with intake.    -f/u bmet with increasing BUN.     -add IVF low rate    -recheck bmet and check prealbumin tomorrow 8. T2DM: Monitor BS ac/hs. Continue SSI.    -Added carb modified restrictions to diet, however worried that this will further decrease his intake.   Fair control of sugars currently CBG (last 3)  Recent Labs    08/04/17 1650 08/04/17 2109 08/05/17 0623  GLUCAP 174* 103* 105*   9. HTN: Monitor BP bid--avoid hypotension but needs better control of diastolic bp.      Vitals:   08/04/17 1433 08/05/17 0122  BP: 128/72 125/82  Pulse: 72 66  Resp: 18 20  Temp: 98.2 F (36.8 C) 98.1 F (36.7  C)  SpO2: 99% 99%  Blood pressures have been somewhat labile, particularly diastolic pressures.  Increase metoprolol XL to 25 mg daily  10. CKD stage III: Monitor renal status. Push fluid intake.    Cr 1.38 on 2/27-at baseline   Cont to monitor 11. Dyslipidemia: now on Lipitor.   12. Morbid obesity             Body mass index is 38.22 kg/m.             Diet and exercise education               13. Acute blood loss anemia   Hemoglobin 11.9 ib 3/5   -follow up cbc tomorrow 14.  Bowel incontinence this is likely related to his cognitive status, nursing working on achieving continence 15. Bladder: consider removing condom cath although it will likely need to more incontinence.    16. Cough: Suspect this is related to dysphagia.  No abnormal breath sounds on exam.  Vital signs are stable at present   -Continue Robitussin but schedule   -Aspiration precautions  -close clinical observation  LOS  (Days) 8 A FACE TO FACE EVALUATION WAS PERFORMED  Jovana Rembold T 08/05/2017 9:06 AM

## 2017-08-05 NOTE — Progress Notes (Signed)
Occupational Therapy Session Note  Patient Details  Name: Noah RumpfJames Pozo Jr. MRN: [id number] Date of Birth: 05/03/1941  Today's Date: 08/05/2017 OT Individual Time: 1430-1515 OT Individual Time Calculation (min): 45 min    Short Term Goals: Week 2:  OT Short Term Goal 1 (Week 2): Pt will maintain sitting balance during functional ADL task with occassional min  A OT Short Term Goal 2 (Week 2): Pt will be able to track to midline to locate ADL item with cues OT Short Term Goal 3 (Week 2): Pt will don shirt with mod A  OT Short Term Goal 4 (Week 2): Pt will demonstration initaition with slide board transfers mat <>w/c OT Short Term Goal 5 (Week 2): Pt will roll toward the left with max A to A with ADLs at bed level  Skilled Therapeutic Interventions/Progress Updates:    1:1 Pt asleep in bed when arrived. Focus on self feeding with bed into chair position (decr physical demand on pt). Pt was unsuccessful feeding himself in midline nor in the right field. Pt required total A to self feed a cup of yogurt- pt desire. Continued to assess visual system including tracking with patching one eye at a time. Pt unable to keep the unpatched eye open due to perceptual difficulties. Re buddy tapped right middle and ringer finger to accommodate swelling. Addressed head positioning with rest with head slightly to the left. Left resting in bed.   Therapy Documentation Precautions:  Precautions Precautions: Fall Precaution Comments: severe left neglect Restrictions Weight Bearing Restrictions: No    Pain:  right hand and middle finger is sore ADL: ADL ADL Comments: see functional navigator  See Function Navigator for Current Functional Status.   Therapy/Group: Individual Therapy  Roney MansSmith, Francie Keeling [facility] 08/05/2017, 3:36 PM

## 2017-08-05 NOTE — Progress Notes (Signed)
Speech Language Pathology Weekly Progress and Session Note  Patient Details  Name: Noah Bush. MRN: [id number] Date of Birth: 16-Feb-1941  Beginning of progress report period:  July 29, 2017  End of progress report period:  August 05, 2017   Today's Date: 08/05/2017 SLP Individual Time: 1400-1426 SLP Individual Time Calculation (min): 26 min  Short Term Goals: Week 1: SLP Short Term Goal 1 (Week 1): Pt will visually scan to midline in 25% of opportunities during basic, familiar tasks with max assist multimodal cues SLP Short Term Goal 1 - Progress (Week 1): Met SLP Short Term Goal 2 (Week 1): Pt will sustain his attention to basic, familiar tasks for 1 minute with max verbal cues for redirection.  SLP Short Term Goal 2 - Progress (Week 1): Progressing toward goal SLP Short Term Goal 3 (Week 1): Pt will utilize external aids to reorient to place, date, and situation with max assist multimodal cues.  SLP Short Term Goal 3 - Progress (Week 1): Met SLP Short Term Goal 4 (Week 1): Pt will consume dys 1 textures and thin liquids with max cues for use of swallowing precautions and minimal overt s/s of aspiration. SLP Short Term Goal 4 - Progress (Week 1): Met    New Short Term Goals: Week 2: SLP Short Term Goal 1 (Week 2): Pt will visually scan to midline in 75% of opportunities during basic, familiar tasks with max assist multimodal cues SLP Short Term Goal 2 (Week 2): Pt will sustain his attention to basic, familiar tasks for 1 minute with max verbal cues for redirection.  SLP Short Term Goal 3 (Week 2): Pt will utilize external aids to reorient to place, date, and situation with max assist verbal cues.  SLP Short Term Goal 4 (Week 2): Pt will consume dys 1 textures and thin liquids with min cues for use of swallowing precautions and minimal overt s/s of aspiration.  Weekly Progress Updates:  Pt has made minimal functional progress and has met 4 out of 5 short term goals.  Pt is  currently max to total assist for basic cognitive tasks due to significant cognitive deficits post R CVA.  Pt has demonstrated improved visual scanning to midline during highly controlled tasks.  Pt is consuming dys 1 textures and thin liquids with max assist cues for use of swallowing precautions.  Continue to recommend 24/7 supervision at discharge in addition to [facility] follow up at next level of care.  No family has been present for education at this time.    Intensity: Minumum of 1-2 x/day, 30 to 90 minutes Frequency: 3 to 5 out of 7 days Duration/Length of Stay: 21-28 days  Treatment/Interventions: Cognitive remediation/compensation;Cueing hierarchy;Dysphagia/aspiration precaution training;Functional tasks;Patient/family education;Internal/external aids;Environmental controls   Daily Session  Skilled Therapeutic Interventions: Pt was seen for skilled ST targeting cognitive goals.  Pt was asleep upon arrival but awakened easily to voice and light touch.  Pt needed max assist multimodal cues for reorientation to place and situation.  Pt was able to scan to midline to make eye contact with therapist while propped up in elevated sidelying position and could sustain his attention to conversations with therapist for 1-2 conversational turns (~30 seconds) before needing max cues for redirection.  Pt had been incontinent of bowel and needed total assist for hygiene and donning of clean brief as well as to alert therapist to having been incontinent.  Pt was left in bed with bed alarm set and call bell within reach.  Goals  updated on this date to reflect current progress and plan of care.        Function:   Eating Eating                 Cognition Comprehension Comprehension assist level: Understands basic 25 - 49% of the time/ requires cueing 50 - 75% of the time  Expression   Expression assist level: Expresses basic 25 - 49% of the time/requires cueing 50 - 75% of the time. Uses single  words/gestures.  Social Interaction Social Interaction assist level: Interacts appropriately 25 - 49% of time - Needs frequent redirection.  Problem Solving Problem solving assist level: Solves basic less than 25% of the time - needs direction nearly all the time or does not effectively solve problems and may need a restraint for safety  Memory Memory assist level: Recognizes or recalls less than 25% of the time/requires cueing greater than 75% of the time   General    Pain Pain Assessment Pain Assessment: No/denies pain  Therapy/Group: Individual Therapy  Hashim Eichhorst, Selinda Orion 08/05/2017, 4:08 PM

## 2017-08-05 NOTE — Progress Notes (Signed)
Nutrition Follow-up  DOCUMENTATION CODES:   Obesity unspecified  INTERVENTION:   Nepro Shake po TID, each supplement provides 425 kcal and 19 grams protein.  30 ml Prostat po TID, each supplement provides 100 kcal and 15 grams of protein.  D/C Ensure Enlive oral nutrition supplement  Encourage PO intake  If all supplements consumed 100%, pt will receive 1575 kcal/day (77% estimated kcal needs) and 102 grams protein/day (100% estimated protein needs).  At this time, nutrition needs only being met via nutritional supplements.  NUTRITION DIAGNOSIS:   Inadequate oral intake related to lethargy/confusion as evidenced by meal completion < 25%.  Ongoing  GOAL:   Patient will meet greater than or equal to 90% of their needs  Unmet at this time  MONITOR:   PO intake, Supplement acceptance, Labs, Weight trends, I & O's, Skin, Diet advancement  REASON FOR ASSESSMENT:   Consult Calorie Count  ASSESSMENT:   77 y.o. RH- male  with history of HTN, Alzheimer dementia, CKD III, and T2DM who was admitted on 07/23/17 from MD office with reports of fall, difficulty walking, drooling, right gaze preference and facial droop. 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 Cognitive evaluation revealed severe cognitive communication impairment question baseline but able to follow basic commands with left visual neglect. He continues to have issues with lethargy requiring tactile cues for activity, has significant left oral motor weakness--started on dysphagia 1, thin liquids and severe left neglect with pusher tendency.    Spoke with pt briefly at bedside. Pt poor historian and states he is eating meals and drinking his oral nutrition supplements. Encouraged pt to eat as much as he is able to from his meal trays.  Per NT, pt has not been eating anything from  his meal trays. NT states that pt ate some peaches with SLP earlier but that "this was all he has eaten all day." NT also reports pt has not been drinking well "like he was last week." RD noted meal completion charted 0-60% since 08/02/17. RD noted that pt has refused meals twice during this timeframe. NT also states that pt has been having liquid bowel movements several times today.  Spoke with RN who states that pt is not eating anything (pt states he is not hungry) but is drinking Nepro and Pro-stat supplements if awake. RN reports pt had 3 Nepro supplements yesterday. RN did not give pt Ensure Enlive due to high blood sugar. RN states that pt is lethargic and often won't wake up for meals. Pt needs reminders to drink the oral nutrition supplements and needs someone to hand them to him.  RD noted weight stable since admission on 07/28/17.  I/O's: -2.3 L since admission  Medications reviewed and include: sliding scale Novolog, rena-vit daily, 17 g Miralax BID  Labs reviewed: CO2 21 (L), BUN 44 (H), creatinine 1.31 (H), hemoglobin A1C 6.9 on 07/24/17 CBG's: 157, 105, 103, 174, 166, 140 since 08/04/17  Diet Order:  DIET - DYS 1 Room service appropriate? Yes; Fluid consistency: Thin  EDUCATION NEEDS:   Not appropriate for education at this time  Skin:  Skin Assessment: Skin Integrity Issues: Skin Integrity Issues:: Other (Comment) Other: open wound to R buttock  Last BM:  08/05/17 large type 7  Height:   Ht Readings from Last 1 Encounters:  07/28/17 5' 11"  (1.803 m)    Weight:   Wt Readings  from Last 1 Encounters:  08/05/17 271 lb 2.7 oz (123 kg)    Ideal Body Weight:  78 kg  BMI:  Body mass index is 37.82 kg/m.  Estimated Nutritional Needs:   Kcal:  2050-2250  Protein:  100-115 grams  Fluid:  2- 2.2 L/day    Gaynell Face, MS, RD, LDN Pager: [phone] Weekend/After Hours: [phone]

## 2017-08-05 NOTE — Progress Notes (Signed)
Physical Therapy Weekly Progress Note  Patient Details  Name: Noah Bush. MRN: [id number] Date of Birth: 1940/08/23  Beginning of progress report period: July 29, 2017 End of progress report period: August 05, 2017  Today's Date: 08/05/2017 PT Individual Time: 1000-1040 PT Individual Time Calculation (min): 40 min   Patient has met 2 of 4 short term goals.  Pt requires max/totalA +2 for bed mobility and transfers with transfer board. Gait performed with sara plus with maxA +2. Pt is limited primarily by L inattention, poor sustained attention, apraxia and lethargy. Additionally limited by pre-morbid L knee ROM limitations reducing ability to utilize LLE functionally during mobility tasks. Pt has demonstrated improvements in static/dynamic sitting balance with consistent min guard.   Patient continues to demonstrate the following deficits muscle weakness, muscle joint tightness and muscle paralysis, decreased cardiorespiratoy endurance, impaired timing and sequencing, abnormal tone, unbalanced muscle activation, motor apraxia, decreased coordination and decreased motor planning, decreased visual perceptual skills and decreased visual motor skills, decreased midline orientation, decreased attention to left, left side neglect, decreased motor planning and ideational apraxia, decreased initiation, decreased attention, decreased awareness, decreased problem solving, decreased safety awareness, decreased memory and delayed processing and decreased sitting balance, decreased standing balance, decreased postural control, hemiplegia and decreased balance strategies and therefore will continue to benefit from skilled PT intervention to increase functional independence with mobility.  Patient progressing minimally towards long term goals..  Continue plan of care.  PT Short Term Goals Week 1:  PT Short Term Goal 1 (Week 1): Pt will roll to L with modA with use of bed rails  PT Short Term Goal 1 -  Progress (Week 1): Not met PT Short Term Goal 2 (Week 1): Pt will perform transfers using LRAD with maxA +2 PT Short Term Goal 2 - Progress (Week 1): Met PT Short Term Goal 3 (Week 1): Pt will perform sit<>stand using LRAD with maxA +1 PT Short Term Goal 3 - Progress (Week 1): Not met PT Short Term Goal 4 (Week 1): Pt will maintain sitting balance for 5 miutes while engaging in functional reaching task PT Short Term Goal 4 - Progress (Week 1): Met Week 2:  PT Short Term Goal 1 (Week 2): Pt will perform rolling R/L modA using bedrails PT Short Term Goal 2 (Week 2): Pt will transfer w/c <>bed using LRAD and modA +2 PT Short Term Goal 3 (Week 2): Pt will perform sit <>stand maxA +1 PT Short Term Goal 4 (Week 2): Pt will initiate w/c propulsion   Skilled Therapeutic Interventions/Progress Updates: Pt received supine in bed, slow to arouse however ultimately agreeable to treatment. Rolling R/L to don pants and clean brief; pt with incontinent bowel movement while changing brief. Allowed pt to sit on bedpan several minutes; upon attempting to clean pt after small bowel movement on bedpan pt began to be incontinent again. Repeated this process two additional times with pt unable to cease bowel movement activity. Rolling requires max/totalA +2 for rolling d/t poor initiation, L inattention, pain in RUE. Pt missed 20 min of therapy d/t incontinence. Alerted next therapist to pt position on bedpan at end of session.      Therapy Documentation Precautions:  Precautions Precautions: Fall Precaution Comments: severe left neglect Restrictions Weight Bearing Restrictions: No General: PT Amount of Missed Time (min): 20 Minutes PT Missed Treatment Reason: Bowel/bladder accident   See Function Navigator for Current Functional Status.  Therapy/Group: Individual Therapy  Luberta Mutter 08/05/2017, 11:38 AM

## 2017-08-06 ENCOUNTER — Inpatient Hospital Stay (HOSPITAL_COMMUNITY): Payer: Medicare Other | Admitting: Physical Therapy

## 2017-08-06 ENCOUNTER — Inpatient Hospital Stay (HOSPITAL_COMMUNITY): Payer: Medicare Other

## 2017-08-06 ENCOUNTER — Inpatient Hospital Stay (HOSPITAL_COMMUNITY): Payer: Medicare Other | Admitting: Speech Pathology

## 2017-08-06 DIAGNOSIS — R7989 Other specified abnormal findings of blood chemistry: Secondary | ICD-10-CM

## 2017-08-06 LAB — BASIC METABOLIC PANEL
Anion gap: 12 (ref 5–15)
BUN: 46 mg/dL — ABNORMAL HIGH (ref 6–20)
CALCIUM: 9.5 mg/dL (ref 8.9–10.3)
CO2: 23 mmol/L (ref 22–32)
CREATININE: 1.34 mg/dL — AB (ref 0.61–1.24)
Chloride: 104 mmol/L (ref 101–111)
GFR, EST AFRICAN AMERICAN: 58 mL/min — AB (ref >60)
GFR, EST NON AFRICAN AMERICAN: 50 mL/min — AB (ref >60)
Glucose, Bld: 139 mg/dL — ABNORMAL HIGH (ref 65–99)
Potassium: 4.5 mmol/L (ref 3.5–5.1)
Sodium: 139 mmol/L (ref 135–145)

## 2017-08-06 LAB — PREALBUMIN: Prealbumin: 12.4 mg/dL — ABNORMAL LOW (ref 18–38)

## 2017-08-06 LAB — GLUCOSE, CAPILLARY
GLUCOSE-CAPILLARY: 107 mg/dL — AB (ref 65–99)
Glucose-Capillary: 138 mg/dL — ABNORMAL HIGH (ref 65–99)
Glucose-Capillary: 123 mg/dL — ABNORMAL HIGH (ref 65–99)
Glucose-Capillary: 163 mg/dL — ABNORMAL HIGH (ref 65–99)

## 2017-08-06 NOTE — Progress Notes (Signed)
Occupational Therapy Session Note  Patient Details  Name: Noah RumpfJames Teters Jr. MRN: [id number] Date of Birth: September 11, 1940  Today's Date: 08/06/2017 OT Individual Time: 1000-1100 OT Individual Time Calculation (min): 60 min    Short Term Goals: Week 2:  OT Short Term Goal 1 (Week 2): Pt will maintain sitting balance during functional ADL task with occassional min  A OT Short Term Goal 2 (Week 2): Pt will be able to track to midline to locate ADL item with cues OT Short Term Goal 3 (Week 2): Pt will don shirt with mod A  OT Short Term Goal 4 (Week 2): Pt will demonstration initaition with slide board transfers mat <>w/c OT Short Term Goal 5 (Week 2): Pt will roll toward the left with max A to A with ADLs at bed level  Skilled Therapeutic Interventions/Progress Updates:    OT intervention with focus on bed mobility, following one step commands, orientation, and attending to his L.  Pt able to bring his head approx 10 degrees past midline to his L but unable to move his eyes past midline.  Pt requires max verbal cues to locate items at midline or slightly to his L.  Pt requires max verbal cues to initiate all transitional movements at bed level.  Pt is tot A + 2 for bed mobility to facilitate changing his brief and donning pants.  Pt required tot A + 2 for Maxi Lift transfer to TIS w/c. Pt required max A for orientation to place and tot A for situation.  Pt sporadically conversed about fishing and called out several times for friends who were not present in room.  Pt remained in w/c at nursing station.   Therapy Documentation Precautions:  Precautions Precautions: Fall Precaution Comments: severe left neglect Restrictions Weight Bearing Restrictions: No Pain: Pt with s/s of discomfort when touching R hand/dingers; RN aware and repositioned See Function Navigator for Current Functional Status.   Therapy/Group: Individual Therapy  Noah Bush, Noah Bush 08/06/2017, 11:02 AM

## 2017-08-06 NOTE — Progress Notes (Signed)
Speech Language Pathology Daily Session Note  Patient Details  Name: Noah RumpfJames Mangal Jr. MRN: [id number] Date of Birth: 21-Feb-1941  Today's Date: 08/06/2017 SLP Individual Time: 6213-08650830-0915 SLP Individual Time Calculation (min): 45 min  Short Term Goals: Week 2: SLP Short Term Goal 1 (Week 2): Pt will visually scan to midline in 75% of opportunities during basic, familiar tasks with max assist multimodal cues SLP Short Term Goal 2 (Week 2): Pt will sustain his attention to basic, familiar tasks for 1 minute with max verbal cues for redirection.  SLP Short Term Goal 3 (Week 2): Pt will utilize external aids to reorient to place, date, and situation with max assist verbal cues.  SLP Short Term Goal 4 (Week 2): Pt will consume dys 1 textures and thin liquids with min cues for use of swallowing precautions and minimal overt s/s of aspiration.  Skilled Therapeutic Interventions: Skilled treatment session focused on dysphagia and cognition goals. SLP facilitated session by providing Total A multimodal cues to scan to midline. SLP also provided Total A to answer basic yes/no questions about environment and himself. Pt states that he is "going to ride over to the car wash in just a little bit." Pt consumed 6 oz of thin orange juice via straw with no overt s/s of aspiration. Pt was left upright in bed, bed alarm set and all needs within reach. Continue per current plan of care.      Function:  Eating Eating   Modified Consistency Diet: Yes Eating Assist Level: Helper brings food to mouth       Helper Brings Food to Mouth: Every scoop   Cognition Comprehension Comprehension assist level: Understands basic less than 25% of the time/ requires cueing >75% of the time  Expression   Expression assist level: Expresses basis less than 25% of the time/requires cueing >75% of the time.  Social Interaction Social Interaction assist level: Interacts appropriately less than 25% of the time. May be withdrawn or  combative.  Problem Solving Problem solving assist level: Solves basic less than 25% of the time - needs direction nearly all the time or does not effectively solve problems and may need a restraint for safety  Memory Memory assist level: Recognizes or recalls less than 25% of the time/requires cueing greater than 75% of the time    Pain Pain Assessment Pain Assessment: No/denies pain Pain Score: 0-No pain  Therapy/Group: Individual Therapy  Shawndell Varas 08/06/2017, 9:03 AM

## 2017-08-06 NOTE — Progress Notes (Signed)
Social Work Patient ID: Noah RumpfJames Mccabe Jr., male   DOB: [birth year], 77 y.o.   MRN: [id number]   CSW spoke with pt's son via phone to update him on team conference discussion and told pt he would be with us for a couple/few more weeks.  Son feels he has seen progress in pt already and is glad he will be here a couple more weeks.  He still feel he and his son (pt's grandson) will be able to provide pt the care he needs at home.  CSW explained that Medicare will cover for home therapists and nurses to come out to the home, but not for someone to come to stay with him or do personal care/household chores.  He expressed understanding.  CSW will continue to follow and assist as needed.

## 2017-08-06 NOTE — Progress Notes (Signed)
Physical Therapy Session Note  Patient Details  Name: Noah RumpfJames Eberlin Jr. MRN: [id number] Date of Birth: Feb 19, 1941  Today's Date: 08/06/2017 PT Individual Time: 1330-1430 PT Individual Time Calculation (min): 60 min   Short Term Goals: Week 2:  PT Short Term Goal 1 (Week 2): Pt will perform rolling R/L modA using bedrails PT Short Term Goal 2 (Week 2): Pt will transfer w/c <>bed using LRAD and modA +2 PT Short Term Goal 3 (Week 2): Pt will perform sit <>stand maxA +1 PT Short Term Goal 4 (Week 2): Pt will initiate w/c propulsion   Skilled Therapeutic Interventions/Progress Updates:    Pt greeted sitting reclined in TIS in room, agreeable to therapy. Pt transported to therapy gym dependently, RN disconnected IV. In therapy gym, pt with suspected BM. Pt transfers with [facility] using [place] DecSara plus with transfer sling. On BSC, pt continent of bowel and bladder with increased time. TotalA for peri care and doffing/donning brief and pants. Pt ambulates 1 x 3410ft and 1 x 345ft using sara plus, decreased success with transfer sling vs walking sling used in previous sessions, pt unable to maintain fully upright with max verbal cues, maxA +2 for weight shifting and advancing LLE. At end of session, pt returned to room, left reclined in TIS, denies any needs at this time.   Therapy Documentation Precautions:  Precautions Precautions: Fall Precaution Comments: severe left neglect Restrictions Weight Bearing Restrictions: No Vital Signs: Therapy Vitals Temp: 98.6 F (37 C) Temp Source: Oral Pulse Rate: 73 Resp: 17 BP: 131/89 Patient Position (if appropriate): Sitting Oxygen Therapy SpO2: 98 % O2 Device: Room Air  See Function Navigator for Current Functional Status.   Therapy/Group: Individual Therapy  Theodosia QuayMorgan Siobhan Zaro 08/06/2017, 3:53 PM

## 2017-08-06 NOTE — Progress Notes (Signed)
Physical Therapy Session Note  Patient Details  Name: Noah RumpfJames Lotts Jr. MRN: [id number] Date of Birth: 24-Sep-1940  Today's Date: 08/06/2017 PT Individual Time: [id number] PT Individual Time Calculation (min): 31 min    Skilled Therapeutic Interventions/Progress Updates:  Pt received in w/c, no behaviors demonstrating pain. Transported pt to dayroom via w/c total assist. Session focused on attention to task and attention to midline. Pt required max multimodal cuing to awaken (intermittent sternal rub & cold cloth on forehead) as pt frequently falling asleep. Pt with R gaze preference requiring max cuing to scan towards midline to locate and identify cups but pt continues to look R. BP during session = 123/65 mmhg (LUE). Pt returned to room and transferred w/c>bed via maximove dependent assist; pt unable to shift weight anteriorly in w/c for sling placement. At end of session pt left in bed in care of NT.  Therapy Documentation Precautions:  Precautions Precautions: Fall Precaution Comments: severe left neglect Restrictions Weight Bearing Restrictions: No   See Function Navigator for Current Functional Status.   Therapy/Group: Individual Therapy  Noah Bush 08/06/2017, 4:12 PM

## 2017-08-06 NOTE — Plan of Care (Signed)
  Progressing Consults RH STROKE PATIENT EDUCATION Description See Patient Education module for education specifics  08/06/2017 1037 - Progressing by Marylene LandGrecu, Kenzie Flakes R, RN Nutrition Consult-if indicated 08/06/2017 1037 - Progressing by Marylene LandGrecu, Johnryan Sao R, RN Diabetes Guidelines if Diabetic/Glucose > 140 Description If diabetic or lab glucose is > 140 mg/dl - Initiate Diabetes/Hyperglycemia Guidelines & Document Interventions  08/06/2017 1037 - Progressing by Marylene LandGrecu, Nazario Russom R, RN RH BOWEL ELIMINATION RH STG MANAGE BOWEL WITH ASSISTANCE Description STG Manage Bowel with  max Assistance.   08/06/2017 1037 - Progressing by Marylene LandGrecu, Heiress Williamson R, RN Flowsheets Taken 08/06/2017 1037  STG: Pt will manage bowels with assistance 1-Total assistance RH STG MANAGE BOWEL W/MEDICATION W/ASSISTANCE Description STG Manage Bowel with Medication with max assist  08/06/2017 1037 - Progressing by Marylene LandGrecu, Rana Adorno R, RN Flowsheets Taken 08/06/2017 1037  STG: Pt will manage bowels with medication with assistance 1-Total assistance RH BLADDER ELIMINATION RH STG MANAGE BLADDER WITH ASSISTANCE Description STG Manage Bladder With max assist  08/06/2017 1037 - Progressing by Marylene LandGrecu, Karlin Heilman R, RN Flowsheets Taken 08/06/2017 1037  STG: Pt will manage bladder with assistance 1-Total assistance RH SKIN INTEGRITY RH STG SKIN FREE OF INFECTION/BREAKDOWN Description While on rehab with max.  08/06/2017 1037 - Progressing by Marylene LandGrecu, Kiptyn Rafuse R, RN RH STG MAINTAIN SKIN INTEGRITY WITH ASSISTANCE Description STG Maintain Skin Integrity With max Assistance.  08/06/2017 1037 - Progressing by Marylene LandGrecu, Karinna Beadles R, RN Flowsheets Taken 08/06/2017 1037  STG: Maintain skin integrity with assistance 1-Total assistance RH SAFETY RH STG ADHERE TO SAFETY PRECAUTIONS W/ASSISTANCE/DEVICE Description STG Adhere to Safety Precautions With mod  Assistance/Device.  08/06/2017 1037 - Progressing by Marylene LandGrecu, Genise Strack R, RN Flowsheets Taken 08/06/2017 1037  STG:Pt will adhere to  safety precautions with assistance/device 2-Maximum assistance RH STG DECREASED RISK OF FALL WITH ASSISTANCE Description STG Decreased Risk of Fall With  Mod Assistance.  08/06/2017 1037 - Progressing by Marylene LandGrecu, Tavio Biegel R, RN Flowsheets Taken 08/06/2017 1037  ZOX:[id number]:Decreased risk of fall  with assistance/device 1-Total assistance RH COGNITION-NURSING RH STG USES MEMORY AIDS/STRATEGIES W/ASSIST TO PROBLEM SOLVE Description STG Uses Memory Aids/Strategies With  Mod Assistance to Problem Solve.  08/06/2017 1037 - Progressing by Marylene LandGrecu, Victorian Gunn R, RN RH KNOWLEDGE DEFICIT RH STG INCREASE KNOWLEDGE OF DIABETES 08/06/2017 1037 - Progressing by Marylene LandGrecu, Kewanda Poland R, RN RH STG INCREASE KNOWLEDGE OF HYPERTENSION 08/06/2017 1037 - Progressing by Marylene LandGrecu, Paco Cislo R, RN RH STG INCREASE KNOWLEDGE OF DYSPHAGIA/FLUID INTAKE 08/06/2017 1037 - Progressing by Marylene LandGrecu, Ciarrah Rae R, RN

## 2017-08-06 NOTE — Progress Notes (Signed)
PHYSICAL MEDICINE & REHABILITATION     PROGRESS NOTE  Subjective/Complaints:   No new issues. In bed sleeping  ROS: limited by cognition  Objective: Vital Signs: Blood pressure 139/88, pulse 64, temperature 98.9 F (37.2 C), temperature source Oral, resp. rate 16, height 5\' 11"  (1.803 m), weight 123 kg (271 lb 2.7 oz), SpO2 99 %. No results found. Recent Labs    08/04/17 1100  WBC 9.8  HGB 11.9*  HCT 35.6*  PLT 336   Recent Labs    08/04/17 1100 08/06/17 0624  NA 137 139  K 3.9 4.5  CL 106 104  GLUCOSE 198* 139*  BUN 44* 46*  CREATININE 1.31* 1.34*  CALCIUM 9.2 9.5   CBG (last 3)  Recent Labs    08/05/17 1701 08/05/17 2105 08/06/17 0618  GLUCAP 142* 151* 107*    Wt Readings from Last 3 Encounters:  08/05/17 123 kg (271 lb 2.7 oz)  07/23/17 124.3 kg (274 lb)  06/29/17 124.3 kg (274 lb)    Physical Exam:  BP 139/88 (BP Location: Left Arm)   Pulse 64   Temp 98.9 F (37.2 C) (Oral)   Resp 16   Ht 5\' 11"  (1.803 m)   Wt 123 kg (271 lb 2.7 oz)   SpO2 99%   BMI 37.82 kg/m  Constitutional: He appears well-developed. He is sleeping and cooperative. Obese  HENT: Normocephalic and atraumatic.  Eyes: No discharge Ongoing. Dysconjugate gaze Neck: supple  Cardiovascular: RRR without murmur. No JVD     Respiratory:CTA Bilaterally without wheezes or rales. Normal effort   GI: Bowel sounds are normal. He exhibits no distension.  Musculoskeletal: He exhibits right middle finger with splint, taped to the right ring finger. -no changes  -left knee with limited ROM  Neurological:   A&Ox3 with significant delay in processing Left facial weakness with moderate dysarthria Able to follow simple motor commands with max cues.  Motor: RUE 3+/5 proximal to distal with trace tone RLE 2+/5 proximal to distal LUE: 2+5 proximal to distal LLE: 3- to 3/5 proximal to distal --motor exam unchanged, effort not consistent Skin: Skin is warm and dry.    Psychiatric:  blunt    Assessment/Plan: 1. 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 Physiatrist is providing close team supervision and 24 hour management of active medical problems listed below. Physiatrist and rehab team continue to assess barriers to discharge/monitor patient progress toward functional and medical goals.  Function:  Bathing Bathing position   Position: Shower  Bathing parts Body parts bathed by patient: Abdomen, Chest Body parts bathed by helper: Right arm, Left arm, Front perineal area, Buttocks, Right upper leg, Left upper leg, Back, Left lower leg, Right lower leg  Bathing assist Assist Level: 2 helpers      Upper Body Dressing/Undressing Upper body dressing   What is the patient wearing?: Pull over shirt/dress       Pull over shirt/dress - Perfomed by helper: Thread/unthread right sleeve, Thread/unthread left sleeve, Put head through opening, Pull shirt over trunk        Upper body assist Assist Level: 2 helpers      Lower Body Dressing/Undressing Lower body dressing   What is the patient wearing?: Hospital Gown       Pants- Performed by helper: Thread/unthread right pants leg, Thread/unthread left pants leg, Pull pants up/down  Non-skid slipper socks- Performed by helper: Don/doff right sock, Don/doff left sock                  Lower body assist Assist for lower body dressing: 2 Helpers      Toileting Toileting Toileting activity did not occur: No continent bowel/bladder event   Toileting steps completed by helper: Adjust clothing prior to toileting, Performs perineal hygiene, Adjust clothing after toileting    Toileting assist Assist level: Two helpers   Transfers Chair/bed transfer   Chair/bed transfer method: Lateral scoot Chair/bed transfer assist level: 2  helpers Chair/bed transfer assistive device: Armrests, Sliding board Mechanical lift: Maximove   Locomotion Ambulation Ambulation activity did not occur: Safety/medical concerns   Max distance: 50' Assist level: 2 helpers   Wheelchair   Type: Manual   Assist Level: Dependent (Pt equals 0%)(dependently transported to gym)  Cognition Comprehension Comprehension assist level: Understands basic 25 - 49% of the time/ requires cueing 50 - 75% of the time  Expression Expression assist level: Expresses basic 25 - 49% of the time/requires cueing 50 - 75% of the time. Uses single words/gestures.  Social Interaction Social Interaction assist level: Interacts appropriately 25 - 49% of time - Needs frequent redirection.  Problem Solving Problem solving assist level: Solves basic less than 25% of the time - needs direction nearly all the time or does not effectively solve problems and may need a restraint for safety  Memory Memory assist level: Recognizes or recalls less than 25% of the time/requires cueing greater than 75% of the time    Medical Problem List and Plan: 1.  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   CIR PT OT speech ongoing   = consider 15hr/7 day therapy 2.  DVT Prophylaxis/Anticoagulation: start  Pharmaceutical: Lovenox 3. Pain Management: tylenol prn. Added Sportscreme to help with joint stiffness bilateral hands and left knee.    -Right hand middle finger pain, base of fracture nondisplaced    = splint, Ortho follow-up after hospitalization   -Right first MTP pain likely gout. xrays negative   -continue current care 4. Mood: Team to provide encouragement/ego support. LCSW to follow for evaluation when appropriate.  5. Alzheimer's dementia/ Neuropsych: This patient is not capable of making decisions on his own behalf. Continue Namenda  6.  Skin/Wound Care: Pressure relief measures. Maintain adequate nutritional and hydration status.  7. Fluids/Electrolytes/Nutrition: Has been refusing intake--needs assistance/feeding for adequate intake. Added supplements (likes drinks cold)  to help with intake.    -BUN sl increased to 46    -added IVF low rate----will continue    -prealbumin low, appreciate dietician follow up, push po     -recheck labs tomorrow 8. T2DM: Monitor BS ac/hs. Continue SSI.    -Added carb modified restrictions to diet, however concerned that this will further decrease his intake.   Fair control of sugars 3/7 CBG (last 3)  Recent Labs    08/05/17 1701 08/05/17 2105 08/06/17 0618  GLUCAP 142* 151* 107*   9. HTN: Monitor BP bid--avoid hypotension but needs better control of diastolic bp.      Vitals:   08/05/17 1500 08/06/17 0112  BP: 125/78 139/88  Pulse: 73 64  Resp: 18 16  Temp: 98.1 F (36.7 C) 98.9 F (37.2 C)  SpO2: 99% 99%     Increased metoprolol XL to 25 mg daily With some improvement 10.  CKD stage III: Monitor renal status. Push fluid intake.    Cr 1.34 3/7   Cont to monitor 11. Dyslipidemia: now on Lipitor.   12. Morbid obesity             Body mass index is 38.22 kg/m.             Diet and exercise education               13. Acute blood loss anemia   Hemoglobin 11.9  3/5      14.  Bowel incontinence this is likely related to his cognitive status, nursing working on achieving continence 15. Bladder: consider removing condom cath although it will likely need to more incontinence.    16. Cough: Suspect this is related to dysphagia.  No abnormal breath sounds on exam.  Vital signs are stable at present   -Continue Robitussin scheduled   -Aspiration precautions  -close clinical observation  LOS (Days) 9 A FACE TO FACE EVALUATION WAS PERFORMED  Noah Bush 08/06/2017 8:44 AM

## 2017-08-07 ENCOUNTER — Inpatient Hospital Stay (HOSPITAL_COMMUNITY): Payer: Medicare Other | Admitting: Physical Therapy

## 2017-08-07 ENCOUNTER — Inpatient Hospital Stay (HOSPITAL_COMMUNITY): Payer: Medicare Other

## 2017-08-07 ENCOUNTER — Inpatient Hospital Stay (HOSPITAL_COMMUNITY): Payer: Medicare Other | Admitting: Speech Pathology

## 2017-08-07 LAB — BASIC METABOLIC PANEL
Anion gap: 10 (ref 5–15)
BUN: 46 mg/dL — ABNORMAL HIGH (ref 6–20)
CALCIUM: 9.1 mg/dL (ref 8.9–10.3)
CO2: 20 mmol/L — ABNORMAL LOW (ref 22–32)
Chloride: 107 mmol/L (ref 101–111)
Creatinine, Ser: 1.31 mg/dL — ABNORMAL HIGH (ref 0.61–1.24)
GFR calc Af Amer: 59 mL/min — ABNORMAL LOW (ref >60)
GFR, EST NON AFRICAN AMERICAN: 51 mL/min — AB (ref >60)
Glucose, Bld: 120 mg/dL — ABNORMAL HIGH (ref 65–99)
POTASSIUM: 4.1 mmol/L (ref 3.5–5.1)
SODIUM: 137 mmol/L (ref 135–145)

## 2017-08-07 LAB — GLUCOSE, CAPILLARY
GLUCOSE-CAPILLARY: 117 mg/dL — AB (ref 65–99)
GLUCOSE-CAPILLARY: 115 mg/dL — AB (ref 65–99)
Glucose-Capillary: 146 mg/dL — ABNORMAL HIGH (ref 65–99)
Glucose-Capillary: 110 mg/dL — ABNORMAL HIGH (ref 65–99)

## 2017-08-07 MED ORDER — MEGESTROL ACETATE 400 MG/10ML PO SUSP
400.0000 mg | Freq: Two times a day (BID) | ORAL | Status: DC
  Administered 2017-08-07 – 2017-08-12 (×12): 400 mg via ORAL
  Filled 2017-08-07 (×12): qty 10

## 2017-08-07 NOTE — Progress Notes (Signed)
[place] PHYSICAL MEDICINE & REHABILITATION     PROGRESS NOTE  Subjective/Complaints:   Up at [employer] station. No new problems reported  ROS: Limited due to cognitive/behavioral    Objective: Vital Signs: Blood pressure (!) 130/92, pulse 64, temperature 97.6 F (36.4 C), temperature source Oral, resp. rate 16, height 5\' 11"  (1.803 m), weight 123 kg (271 lb 2.7 oz), SpO2 98 %. No results found. Recent Labs    08/04/17 1100  WBC 9.8  HGB 11.9*  HCT 35.6*  PLT 336   Recent Labs    08/06/17 0624 08/07/17 0549  NA 139 137  K 4.5 4.1  CL 104 107  GLUCOSE 139* 120*  BUN 46* 46*  CREATININE 1.34* 1.31*  CALCIUM 9.5 9.1   CBG (last 3)  Recent Labs    08/06/17 1630 08/06/17 2103 08/07/17 0650  GLUCAP 123* 163* 117*    Wt Readings from Last 3 Encounters:  08/05/17 123 kg (271 lb 2.7 oz)  07/23/17 124.3 kg (274 lb)  06/29/17 124.3 kg (274 lb)    Physical Exam:  BP (!) 130/92 (BP Location: Left Arm)   Pulse 64   Temp 97.6 F (36.4 C) (Oral)   Resp 16   Ht 5\' 11"  (1.803 m)   Wt 123 kg (271 lb 2.7 oz)   SpO2 98%   BMI 37.82 kg/m  Constitutional: NAD. Obese  HENT: Normocephalic and atraumatic.  Eyes: No discharge Ongoing. Dysconjugate gaze ongoing Neck: supple  Cardiovascular: RRR without murmur. No JVD      Respiratory:CTA Bilaterally without wheezes or rales. Normal effort   GI: Bowel sounds are normal. He exhibits no distension.  Musculoskeletal: He exhibits right middle finger with splint, taped to the right ring finger. -no changes  -left knee with limited ROM still  Neurological:   A&Ox3 with significant delay in processing Left facial weakness with moderate dysarthria Able to follow simple motor commands with max cues.  Motor: RUE 3+/5 proximal to distal with tr/4 tone RLE 2+ to 3-/5 proximal to distal LUE: 2+5 proximal to distal LLE: 3- to 3/5 proximal to distal --no changes Skin: Skin is warm and dry.    Psychiatric: flat/blunt    Assessment/Plan: 1. 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 Physiatrist is providing close team supervision and 24 hour management of active medical problems listed below. Physiatrist and rehab team continue to assess barriers to discharge/monitor patient progress toward functional and medical goals.  Function:  Bathing Bathing position   Position: Shower  Bathing parts Body parts bathed by patient: Abdomen, Chest Body parts bathed by helper: Right arm, Left arm, Front perineal area, Buttocks, Right upper leg, Left upper leg, Back, Left lower leg, Right lower leg  Bathing assist Assist Level: 2 helpers      Upper Body Dressing/Undressing Upper body dressing   What is the patient wearing?: Pull over shirt/dress       Pull over shirt/dress - Perfomed by helper: Thread/unthread right sleeve, Thread/unthread left sleeve, Put head through opening, Pull shirt over trunk        Upper body assist Assist Level: 2 helpers      Lower Body Dressing/Undressing Lower body dressing   What is the patient wearing?: Hospital Gown       Pants- Performed by helper: Thread/unthread right pants leg, Thread/unthread left pants leg, Pull pants up/down  Non-skid slipper socks- Performed by helper: Don/doff right sock, Don/doff left sock                  Lower body assist Assist for lower body dressing: 2 Helpers      Toileting Toileting Toileting activity did not occur: No continent bowel/bladder event   Toileting steps completed by helper: Adjust clothing prior to toileting, Performs perineal hygiene, Adjust clothing after toileting    Toileting assist Assist level: Two helpers   Transfers Chair/bed transfer   Chair/bed transfer method: Other Chair/bed transfer assist level: 2 helpers Chair/bed transfer  assistive device: Mechanical lift Mechanical lift: Maximove   Locomotion Ambulation Ambulation activity did not occur: Safety/medical concerns   Max distance: 88ft Assist level: 2 helpers   Wheelchair   Type: Manual   Assist Level: Dependent (Pt equals 0%)(dependently transported to gym)  Cognition Comprehension Comprehension assist level: Understands basic 25 - 49% of the time/ requires cueing 50 - 75% of the time, Understands basic 50 - 74% of the time/ requires cueing 25 - 49% of the time  Expression Expression assist level: Expresses basic 25 - 49% of the time/requires cueing 50 - 75% of the time. Uses single words/gestures.  Social Interaction Social Interaction assist level: Interacts appropriately 50 - 74% of the time - May be physically or verbally inappropriate.  Problem Solving Problem solving assist level: Solves basic less than 25% of the time - needs direction nearly all the time or does not effectively solve problems and may need a restraint for safety  Memory Memory assist level: Recognizes or recalls less than 25% of the time/requires cueing greater than 75% of the time    Medical Problem List and Plan: 1.  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   CIR PT OT speech therapy, slow functional progress   = consider 15hr/7 day therapy 2.  DVT Prophylaxis/Anticoagulation: start  Pharmaceutical: Lovenox 3. Pain Management: tylenol prn. Added Sportscreme to help with joint stiffness bilateral hands and left knee.    -Right hand middle finger pain, base of fracture nondisplaced    = splint, Ortho follow-up after hospitalization   -Right first MTP pain likely gout. xrays negative   -continue current care 4. Mood: Team to provide encouragement/ego support. LCSW to follow for evaluation when appropriate.  5. Alzheimer's dementia/ Neuropsych: This  patient is not capable of making decisions on his own behalf. Continue Namenda  6. Skin/Wound Care: Pressure relief measures. Maintain adequate nutritional and hydration status.  7. Fluids/Electrolytes/Nutrition: poor intake, a lot of that is cognitive. Will need to be encouraged to eat/supervision.    -BUN sl holding at 46    -continue IVF low rate    -prealbumin low, appreciate dietician follow up, push po     -daily bmet    -add megace    -may need to discuss with family re: options for nutrition 8. T2DM: Monitor BS ac/hs. Continue SSI.    -   Fair control of sugars 3/8 CBG (last 3)  Recent Labs    08/06/17 1630 08/06/17 2103 08/07/17 0650  GLUCAP 123* 163* 117*   9. HTN: Monitor BP bid--avoid hypotension but needs better control of diastolic bp.      Vitals:   08/06/17 1545 08/07/17 0500  BP: 123/65 (!) 130/92  Pulse: 73 64  Resp:  16  Temp:  97.6 F (36.4 C)  SpO2:  98%     Increased metoprolol XL to 25 mg daily with improvement---continue to monitor 10. CKD stage III: Monitor renal status. Push fluid intake.    Cr 1.31 3/8   Cont to monitor 11. Dyslipidemia: now on Lipitor.   12. Morbid obesity             Body mass index is 38.22 kg/m.             Diet and exercise education               13. Acute blood loss anemia   Hemoglobin 11.9  3/5      14.  Bowel incontinence this is likely related to his cognitive status, nursing working on achieving continence 15. Bladder: consider removing condom cath although it will likely need to more incontinence.    16. Cough: Suspect this is related to dysphagia.  No abnormal breath sounds on exam.  Vital signs are stable at present   -Continue Robitussin scheduled   -Aspiration precautions  -close clinical observation  LOS (Days) 10 A FACE TO FACE EVALUATION WAS PERFORMED  SWARTZ,ZACHARY T 08/07/2017 8:49 AM

## 2017-08-07 NOTE — Progress Notes (Signed)
Speech Language Pathology Daily Session Note  Patient Details  Name: Noah RumpfJames Roberge Jr. MRN: [id number] Date of Birth: 17-Jan-1941  Today's Date: 08/07/2017 SLP Individual Time: 0830-0930 SLP Individual Time Calculation (min): 60 min  Short Term Goals: Week 2: SLP Short Term Goal 1 (Week 2): Pt will visually scan to midline in 75% of opportunities during basic, familiar tasks with max assist multimodal cues SLP Short Term Goal 2 (Week 2): Pt will sustain his attention to basic, familiar tasks for 1 minute with max verbal cues for redirection.  SLP Short Term Goal 3 (Week 2): Pt will utilize external aids to reorient to place, date, and situation with max assist verbal cues.  SLP Short Term Goal 4 (Week 2): Pt will consume dys 1 textures and thin liquids with min cues for use of swallowing precautions and minimal overt s/s of aspiration.  Skilled Therapeutic Interventions: Skilled treatment session focused on dysphagia and cognition goals. SLP facilitated session by providing Max A cues for orientation to place. After multiple repetitive cues, pt able to recall after 1 minutes. Pt was able to look midline to locate 2 people with Max a cues. With Max A cues and object held to pt's right wihtin his eye gaze, pt was not able to name pieces of money or colors. SLP further facilitated session by providing skilled observation of pt consuming 1/4 piece of graham cracker. Pt with increased oral residue on left and unable to clear without liquid wash. Safest diet continues to be dysphagia 1 with thin liquids. Pt is not cognitively aware that he needs to consume more to maintain his nutritional well being. Pt was left upright in the wheelchair at the nursing station for increased safety. Continue per current plan of care.      Function:  Eating Eating   Modified Consistency Diet: Yes Eating Assist Level: Helper feeds patient       Helper Brings Food to Mouth: Every scoop   Cognition Comprehension  Comprehension assist level: Understands basic 25 - 49% of the time/ requires cueing 50 - 75% of the time;Understands basic 50 - 74% of the time/ requires cueing 25 - 49% of the time  Expression   Expression assist level: Expresses basic 25 - 49% of the time/requires cueing 50 - 75% of the time. Uses single words/gestures.;Expresses basis less than 25% of the time/requires cueing >75% of the time.  Social Interaction Social Interaction assist level: Interacts appropriately less than 25% of the time. May be withdrawn or combative.;Interacts appropriately 25 - 49% of time - Needs frequent redirection.  Problem Solving Problem solving assist level: Solves basic less than 25% of the time - needs direction nearly all the time or does not effectively solve problems and may need a restraint for safety  Memory Memory assist level: Recognizes or recalls less than 25% of the time/requires cueing greater than 75% of the time    Pain    Therapy/Group: Individual Therapy  Tristram Milian 08/07/2017, 12:22 PM

## 2017-08-07 NOTE — Progress Notes (Signed)
Physical Therapy Session Note  Patient Details  Name: Noah RumpfJames Dawkins Jr. MRN: [id number] Date of Birth: Nov 18, 1940  Today's Date: 08/07/2017 PT Individual Time: 1000-1115 PT Individual Time Calculation (min): 75 min   Short Term Goals: Week 2:  PT Short Term Goal 1 (Week 2): Pt will perform rolling R/L modA using bedrails PT Short Term Goal 2 (Week 2): Pt will transfer w/c <>bed using LRAD and modA +2 PT Short Term Goal 3 (Week 2): Pt will perform sit <>stand maxA +1 PT Short Term Goal 4 (Week 2): Pt will initiate w/c propulsion   Skilled Therapeutic Interventions/Progress Updates: Pt received seated in w/c at nurses station, no evidence of pain throughout session. Sit <>stand in sara plus x2 trials totalA. Gait x10' with maxA +2 for steering sara plus and for facilitating weight shift, assisting with L foot placement. Sit<>stand maxA +2 "three muskateers" x2 trials, performed pre-gait with RLE forward/backward steps requiring max multimodal cueing. IV disconnected d/t error message and burden of care. Squat pivot transfer w/c <>mat table and w/c>bed with maxA +2, strong L lean in unsupported sitting and pt resistant to correction to midline or weight shift to R to facilitate head/hips positioning for transfer. Sitting balance on mat initially min/modA with strong L lateral lean and L posterior trunk rotation. Utilized wedge under R hip in sitting for pelvic alignment, R trunk shortening/L trunk lengthening. Performed R elbow propped lying x3 trials for reorienting to midline, RUE reaching to R to retrieve horseshoes. Following intervention pt demo's S sitting balance with improved trunk alignment near neutral. Pt incontinent of urine in brief; able to tell therapist he needed to go but unable to get to toilet or urinal in time. Returned mat>w/c>bed as above. Rolling R maxA, rolling L totalA +2 to A with changing brief and performing hygiene. Remained supine in bed, heels floated, alarm intact and all  needs in reach at completion of session.      Therapy Documentation Precautions:  Precautions Precautions: Fall Precaution Comments: severe left neglect Restrictions Weight Bearing Restrictions: No Pain: Pain Assessment Pain Assessment: No/denies pain   See Function Navigator for Current Functional Status.   Therapy/Group: Individual Therapy  Noah Bush 08/07/2017, 11:42 AM

## 2017-08-07 NOTE — Progress Notes (Signed)
Physical Therapy Session Note  Patient Details  Name: Noah RumpfJames Mcclimans Jr. MRN: [id number] Date of Birth: March 29, 1941  Today's Date: 08/07/2017 PT Individual Time: 1530-1600 PT Individual Time Calculation (min): 30 min   Short Term Goals: Week 2:  PT Short Term Goal 1 (Week 2): Pt will perform rolling R/L modA using bedrails PT Short Term Goal 2 (Week 2): Pt will transfer w/c <>bed using LRAD and modA +2 PT Short Term Goal 3 (Week 2): Pt will perform sit <>stand maxA +1 PT Short Term Goal 4 (Week 2): Pt will initiate w/c propulsion   Skilled Therapeutic Interventions/Progress Updates:    no indication of pain.  Session focus on initiation, attention, and arousal.  Pt requires max multimodal cues throughout session to maintain alertness, for visual scanning to L, and for initiation of movement.  Pt noted to be incontinent, requires total +2 assist for rolling R and L to change brief.  Use of hoyer to transfer out of bed to w/c for upright sitting tolerance.  Call bell in reach and NT notified of pt position.   Therapy Documentation Precautions:  Precautions Precautions: Fall Precaution Comments: severe left neglect Restrictions Weight Bearing Restrictions: No   See Function Navigator for Current Functional Status.   Therapy/Group: Individual Therapy  Noah Bush 08/07/2017, 4:53 PM

## 2017-08-07 NOTE — Progress Notes (Signed)
Occupational Therapy Session Note  Patient Details  Name: Noah RumpfJames Runco Jr. MRN: [id number] Date of Birth: 1941/05/30  Today's Date: 08/07/2017 OT Individual Time: 1300-1355 OT Individual Time Calculation (min): 55 min    Short Term Goals: Week 2:  OT Short Term Goal 1 (Week 2): Pt will maintain sitting balance during functional ADL task with occassional min  A OT Short Term Goal 2 (Week 2): Pt will be able to track to midline to locate ADL item with cues OT Short Term Goal 3 (Week 2): Pt will don shirt with mod A  OT Short Term Goal 4 (Week 2): Pt will demonstration initaition with slide board transfers mat <>w/c OT Short Term Goal 5 (Week 2): Pt will roll toward the left with max A to A with ADLs at bed level  Skilled Therapeutic Interventions/Progress Updates:    Pt resting in bed upon arrival with gaze averted to his R.  Pt did not initially scan room to locate therapist when spoken to.  Pt able to bring head to midline with max verbal cues and extra time to initiate.  Pt unable to bring eyes past midline to his L.  Pt required tot A for orientation to city, place, and reason for hospitalization.  Pt continued to insist he was in [place] off [address].  Pt required max multimodal cues to initiate all tasks with limited success.  Pt required tot A + 2 to change brief and don pants at bed level.  Pt positioned in sitting position while in bed in attempt to increase pt's arousal/alertness.  Pt with difficulty keeping eyes open.  Pt required max multimodal cues to follow one step commands ~25% of time.  Pt remained in bed with alarm activated and needs within reach.   Therapy Documentation Precautions:  Precautions Precautions: Fall Precaution Comments: severe left neglect Restrictions Weight Bearing Restrictions: No Pain:  Pt with no s/s of pain  See Function Navigator for Current Functional Status.   Therapy/Group: Individual Therapy  Rich BraveLanier, Alonna Bartling Chappell 08/07/2017, 2:54 PM

## 2017-08-08 ENCOUNTER — Inpatient Hospital Stay (HOSPITAL_COMMUNITY): Payer: Medicare Other | Admitting: Occupational Therapy

## 2017-08-08 DIAGNOSIS — I63311 Cerebral infarction due to thrombosis of right middle cerebral artery: Secondary | ICD-10-CM

## 2017-08-08 DIAGNOSIS — M79644 Pain in right finger(s): Secondary | ICD-10-CM

## 2017-08-08 DIAGNOSIS — E639 Nutritional deficiency, unspecified: Secondary | ICD-10-CM

## 2017-08-08 LAB — BASIC METABOLIC PANEL
ANION GAP: 12 (ref 5–15)
BUN: 39 mg/dL — ABNORMAL HIGH (ref 6–20)
CALCIUM: 9.1 mg/dL (ref 8.9–10.3)
CHLORIDE: 105 mmol/L (ref 101–111)
CO2: 18 mmol/L — AB (ref 22–32)
Creatinine, Ser: 1.17 mg/dL (ref 0.61–1.24)
GFR calc non Af Amer: 59 mL/min — ABNORMAL LOW (ref >60)
GLUCOSE: 106 mg/dL — AB (ref 65–99)
POTASSIUM: 4 mmol/L (ref 3.5–5.1)
Sodium: 135 mmol/L (ref 135–145)

## 2017-08-08 LAB — GLUCOSE, CAPILLARY
GLUCOSE-CAPILLARY: 113 mg/dL — AB (ref 65–99)
GLUCOSE-CAPILLARY: 107 mg/dL — AB (ref 65–99)
Glucose-Capillary: 107 mg/dL — ABNORMAL HIGH (ref 65–99)
Glucose-Capillary: 105 mg/dL — ABNORMAL HIGH (ref 65–99)

## 2017-08-08 NOTE — Progress Notes (Signed)
[place] PHYSICAL MEDICINE & REHABILITATION     PROGRESS NOTE  Subjective/Complaints:  Patient seen lying in bed this morning. He states he slept well overnight. He keeps his eyes closed during the exam.  ROS: Ltd. due to cognitive/behavioral    Objective: Vital Signs: Blood pressure 113/80, pulse 64, temperature 97.8 F (36.6 C), temperature source Oral, resp. rate 16, height 5\' 11"  (1.803 m), weight 123 kg (271 lb 2.7 oz), SpO2 98 %. No results found. No results for input(s): WBC, HGB, HCT, PLT in the last 72 hours. Recent Labs    08/07/17 0549 08/08/17 0534  NA 137 135  K 4.1 4.0  CL 107 105  GLUCOSE 120* 106*  BUN 46* 39*  CREATININE 1.31* 1.17  CALCIUM 9.1 9.1   CBG (last 3)  Recent Labs    08/07/17 2125 08/08/17 0646 08/08/17 1126  GLUCAP 110* 107* 113*    Wt Readings from Last 3 Encounters:  08/05/17 123 kg (271 lb 2.7 oz)  07/23/17 124.3 kg (274 lb)  06/29/17 124.3 kg (274 lb)    Physical Exam:  BP 113/80 (BP Location: Right Arm)   Pulse 64   Temp 97.8 F (36.6 C) (Oral)   Resp 16   Ht 5\' 11"  (1.803 m)   Wt 123 kg (271 lb 2.7 oz)   SpO2 98%   BMI 37.82 kg/m  Constitutional: NAD. Obese  HENT: Normocephalic and atraumatic.  Eyes: No discharge Ongoing. Keeps eyes closed Cardiovascular: RRR. No JVD      Respiratory:CTA Bilaterally. Normal effort   GI: Bowel sounds are normal. He exhibits no distension.  Musculoskeletal: Right middle finger with splint, taped to the right ring finger.  Neurological:    alert and oriented  Left facial weakness with moderate dysarthria Able to follow simple motor commands with max cues.  Motor: RUE 3+/5 proximal to distal (not moving today)  RLE 2+ to 3-/5 proximal to distal (not moving today) LUE: 2+5 proximal to distal LLE: 3- to 3/5 proximal to distal Skin: Skin is warm and dry.    Psychiatric: flat/blunt   Assessment/Plan: 1. 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 Physiatrist is providing close team supervision and 24 hour management of active medical problems listed below. Physiatrist and rehab team continue to assess barriers to discharge/monitor patient progress toward functional and medical goals.  Function:  Bathing Bathing position   Position: Shower  Bathing parts Body parts bathed by patient: Abdomen, Chest Body parts bathed by helper: Right arm, Left arm, Front perineal area, Buttocks, Right upper leg, Left upper leg, Back, Left lower leg, Right lower leg  Bathing assist Assist Level: 2 helpers      Upper Body Dressing/Undressing Upper body dressing   What is the patient wearing?: Pull over shirt/dress       Pull over shirt/dress - Perfomed by helper: Thread/unthread right sleeve, Thread/unthread left sleeve, Put head through opening, Pull shirt over trunk        Upper body assist Assist Level: 2 helpers      Lower Body Dressing/Undressing Lower body dressing   What is the patient wearing?: Hospital Gown       Pants- Performed by helper: Thread/unthread right pants leg, Thread/unthread left pants leg, Pull pants up/down   Non-skid slipper socks- Performed by helper: Don/doff right sock, Don/doff left sock  Lower body assist Assist for lower body dressing: 2 Helpers      Toileting Toileting Toileting activity did not occur: No continent bowel/bladder event   Toileting steps completed by helper: Adjust clothing prior to toileting, Performs perineal hygiene, Adjust clothing after toileting    Toileting assist Assist level: Two helpers   Transfers Chair/bed transfer   Chair/bed transfer method: Squat pivot Chair/bed transfer assist level: 2 helpers Chair/bed transfer assistive device: Mechanical lift Mechanical lift: Maximove   Locomotion Ambulation Ambulation  activity did not occur: Safety/medical concerns   Max distance: 10 Assist level: 2 helpers   Wheelchair   Type: Manual   Assist Level: Dependent (Pt equals 0%)(dependently transported to gym)  Cognition Comprehension Comprehension assist level: Understands basic 25 - 49% of the time/ requires cueing 50 - 75% of the time, Understands basic 50 - 74% of the time/ requires cueing 25 - 49% of the time  Expression Expression assist level: Expresses basic 25 - 49% of the time/requires cueing 50 - 75% of the time. Uses single words/gestures., Expresses basis less than 25% of the time/requires cueing >75% of the time.  Social Interaction Social Interaction assist level: Interacts appropriately less than 25% of the time. May be withdrawn or combative., Interacts appropriately 25 - 49% of time - Needs frequent redirection.  Problem Solving Problem solving assist level: Solves basic less than 25% of the time - needs direction nearly all the time or does not effectively solve problems and may need a restraint for safety  Memory Memory assist level: Recognizes or recalls less than 25% of the time/requires cueing greater than 75% of the time    Medical Problem List and Plan: 1.  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   Cont CIR  2.  DVT Prophylaxis/Anticoagulation: start  Pharmaceutical: Lovenox 3. Pain Management: tylenol prn. Added Sportscreme to help with joint stiffness bilateral hands and left knee.    -Right hand middle finger pain, base of fracture nondisplaced, x-ray reviewed    = splint, Ortho follow-up after hospitalization   -Right first MTP pain likely gout. xrays negative   -continue current care 4. Mood: Team to provide encouragement/ego support. LCSW to follow for evaluation when appropriate.  5. Alzheimer's dementia/ Neuropsych: This patient is not  capable of making decisions on his own behalf. Continue Namenda  6. Skin/Wound Care: Pressure relief measures. Maintain adequate nutritional and hydration status.  7. Fluids/Electrolytes/Nutrition: poor intake, a lot of that is cognitive. Encourage to eat/supervision.    continue IVF low rate   prealbumin low, appreciate dietician follow up, push po   daily bmet     added megace   may need to discuss with family re: options for nutrition   Intake remains poor on 3/9 8. T2DM: Monitor BS ac/hs. Continue SSI.    -Relatively controlled on 3/9 CBG (last 3)  Recent Labs    08/07/17 2125 08/08/17 0646 08/08/17 1126  GLUCAP 110* 107* 113*   9. HTN: Monitor BP bid--avoid hypotension but needs better control of diastolic bp.      Vitals:   08/07/17 1416 08/08/17 0433  BP: 139/70 113/80  Pulse: 73 64  Resp: 18 16  Temp: 97.9 F (36.6 C) 97.8 F (36.6 C)  SpO2: 98% 98%    Increased metoprolol XL to 25 mg daily   Relatively controlled on 3/9 10. CKD stage III: Monitor renal status.  Push fluid intake.    Cr 1.17 on 3/9   Cont to monitor 11. Dyslipidemia: now on Lipitor.   12. Morbid obesity             Body mass index is 38.22 kg/m.             Diet and exercise education 13. Acute blood loss anemia   Hemoglobin 11.9  3/5   Labs ordered for Monday     14.  Bowel incontinence this is likely related to his cognitive status, nursing working on achieving continence 15. Bladder: consider removing condom cath although it will likely need to more incontinence.    16. Cough: Improving   Suspect this is related to dysphagia.     -Continue Robitussin scheduled   -Aspiration precautions  -close clinical observation  LOS (Days) 11 A FACE TO FACE EVALUATION WAS PERFORMED  Ankit Karis Juba 08/08/2017 11:32 AM

## 2017-08-08 NOTE — Progress Notes (Signed)
Occupational Therapy Session Note  Patient Details  Name: Noah Bush. MRN: [id number] Date of Birth: November 21, 1940  Today's Date: 08/08/2017 OT Individual Time: 1300-1345 OT Individual Time Calculation (min): 45 min    Short Term Goals: Week 2:  OT Short Term Goal 1 (Week 2): Pt will maintain sitting balance during functional ADL task with occassional min  A OT Short Term Goal 2 (Week 2): Pt will be able to track to midline to locate ADL item with cues OT Short Term Goal 3 (Week 2): Pt will don shirt with mod A  OT Short Term Goal 4 (Week 2): Pt will demonstration initaition with slide board transfers mat <>w/c OT Short Term Goal 5 (Week 2): Pt will roll toward the left with max A to A with ADLs at bed level  Skilled Therapeutic Interventions/Progress Updates:    treatment session focused on ADLs/self care training, bed mobility training, NMR and pt education. Upon entering room, pt supine in bed resting. Noted decreased alertness and awareness throughout session. Appeared very lethagic, required continual prompting for engagment in activities. Pt stated he had to use the bathroom. LB toileting and washing completed in bed, toatl A + 2 for bed mobs. Pt required tactile assistance for initiating L>R UE movement. Pt noted stiffness in B LE L>R. Therapist provided gentle PROM to L UE and joint compression to decrease stiffness and increase mobility. Pt left resting comfortably in bed with needs met.   Therapy Documentation Precautions:  Precautions Precautions: Fall Precaution Comments: severe left neglect Restrictions Weight Bearing Restrictions: No    Vital Signs: Therapy Vitals Temp: 98.4 F (36.9 C) Temp Source: Oral Pulse Rate: 77 Resp: 18 BP: 105/70 Patient Position (if appropriate): Lying Oxygen Therapy SpO2: 100 % O2 Device: Room Air Pain: Pain Assessment Pain Assessment: No/denies pain ADL: ADL ADL Comments: see functional navigator See Function Navigator for  Current Functional Status.   Therapy/Group: Individual Therapy  Delon Sacramento 08/08/2017, 3:34 PM

## 2017-08-08 NOTE — Plan of Care (Signed)
  RH BOWEL ELIMINATION RH STG MANAGE BOWEL WITH ASSISTANCE Description STG Manage Bowel with  max Assistance.   08/08/2017 0950 - Not Progressing by Pierre BaliLeonar, Mussa Groesbeck G, RN     Pt is  incontinent; total care x2   RH BLADDER ELIMINATION RH STG MANAGE BLADDER WITH ASSISTANCE Description STG Manage Bladder With max assist  08/08/2017 0950 - Not Progressing by Pierre BaliLeonar, Donivan Thammavong G, RN; patient is incontinent; total carex2

## 2017-08-08 NOTE — Plan of Care (Signed)
  RH BOWEL ELIMINATION RH STG MANAGE BOWEL WITH ASSISTANCE Description STG Manage Bowel with  max Assistance.   08/08/2017 0949 - Progressing by Pierre BaliLeonar, Arora Coakley G, RN   RH BLADDER ELIMINATION RH STG MANAGE BLADDER WITH ASSISTANCE Description STG Manage Bladder With max assist  08/08/2017 0949 - Not Progressing by Pierre BaliLeonar, Vear Staton G, RN

## 2017-08-09 ENCOUNTER — Inpatient Hospital Stay (HOSPITAL_COMMUNITY): Payer: Medicare Other

## 2017-08-09 DIAGNOSIS — R059 Cough, unspecified: Secondary | ICD-10-CM | POA: Insufficient documentation

## 2017-08-09 DIAGNOSIS — R05 Cough: Secondary | ICD-10-CM

## 2017-08-09 DIAGNOSIS — I952 Hypotension due to drugs: Secondary | ICD-10-CM | POA: Insufficient documentation

## 2017-08-09 LAB — BASIC METABOLIC PANEL
Anion gap: 9 (ref 5–15)
BUN: 37 mg/dL — ABNORMAL HIGH (ref 6–20)
CHLORIDE: 108 mmol/L (ref 101–111)
CO2: 20 mmol/L — ABNORMAL LOW (ref 22–32)
CREATININE: 1.21 mg/dL (ref 0.61–1.24)
Calcium: 9.1 mg/dL (ref 8.9–10.3)
GFR, EST NON AFRICAN AMERICAN: 56 mL/min — AB (ref >60)
Glucose, Bld: 102 mg/dL — ABNORMAL HIGH (ref 65–99)
Potassium: 3.9 mmol/L (ref 3.5–5.1)
SODIUM: 137 mmol/L (ref 135–145)

## 2017-08-09 LAB — GLUCOSE, CAPILLARY
GLUCOSE-CAPILLARY: 92 mg/dL (ref 65–99)
GLUCOSE-CAPILLARY: 108 mg/dL — AB (ref 65–99)
Glucose-Capillary: 118 mg/dL — ABNORMAL HIGH (ref 65–99)
Glucose-Capillary: 96 mg/dL (ref 65–99)

## 2017-08-09 MED ORDER — METOPROLOL SUCCINATE ER 25 MG PO TB24
12.5000 mg | ORAL_TABLET | Freq: Every day | ORAL | Status: DC
  Administered 2017-08-10 – 2017-08-25 (×16): 12.5 mg via ORAL
  Filled 2017-08-09 (×16): qty 1

## 2017-08-09 NOTE — Progress Notes (Signed)
Slept all night. Drank 1 nepro and 120cc's water at HS. 1/2 NS infusing at 50cc/hr[facility] No intiation. Doesn't call when incontinent.Noah MartinezMurray, Charlean Carneal A

## 2017-08-09 NOTE — Progress Notes (Signed)
Physical Therapy Session Note  Patient Details  Name: Noah RumpfJames Agan Jr. MRN: [id number] Date of Birth: [birth year]  Today's Date: 08/09/2017 PT Individual Time: [id number] PT Individual Time Calculation (min): 30 min   Short Term Goals: Week 2:  PT Short Term Goal 1 (Week 2): Pt will perform rolling R/L modA using bedrails PT Short Term Goal 2 (Week 2): Pt will transfer w/c <>bed using LRAD and modA +2 PT Short Term Goal 3 (Week 2): Pt will perform sit <>stand maxA +1 PT Short Term Goal 4 (Week 2): Pt will initiate w/c propulsion   Skilled Therapeutic Interventions/Progress Updates:    Pt supine in bed upon PT arrival, agreeable to therapy tx and denies pain at rest. Pt transferred from supine>sitting EOB with max assist +2, verbal cues for techniques and tactile cues for sequencing. Pt requiring mod assist initially to maintain sitting balance, fading to stand by assist, verbal and tactile cues for upright posture/positioning. In sitting, working on attention to task and active assisted cervical rotation to midline to visually track drinking cup, wash cloth, faces. Pt requiring hand over hand assist to reach for cup and bring to mouth. Pt able to grasp wash cloth with L hand and bring to face to wipe eyes with max verbal cues to attend to task. Pt transferred to supine with total assist +2. Left supine in bed with needs in reach, bed alarm set.   Therapy Documentation Precautions:  Precautions Precautions: Fall Precaution Comments: severe left neglect Restrictions Weight Bearing Restrictions: No   See Function Navigator for Current Functional Status.   Therapy/Group: Individual Therapy  Cresenciano GenreEmily van Schagen, PT, DPT 08/09/2017, 8:02 AM

## 2017-08-09 NOTE — Plan of Care (Signed)
  [facility] See Patient Education module for education specifics  08/09/2017 0411 - Not Progressing by Martina SinnerMurray, Tameeka Luo A, RN Diabetes Guidelines if Diabetic/Glucose > 140 Description If diabetic or lab glucose is > 140 mg/dl - Initiate Diabetes/Hyperglycemia Guidelines & Document Interventions  08/09/2017 0411 - Not Progressing by Martina SinnerMurray, Shealeigh Dunstan A, RN RH BOWEL ELIMINATION RH STG MANAGE BOWEL WITH ASSISTANCE Description STG Manage Bowel with  max Assistance.   08/09/2017 0411 - Not Progressing by Martina SinnerMurray, Detavious Rinn A, RN RH STG MANAGE BOWEL W/MEDICATION W/ASSISTANCE Description STG Manage Bowel with Medication with max assist  08/09/2017 0411 - Not Progressing by Martina SinnerMurray, Jazz Biddy A, RN RH BLADDER ELIMINATION RH STG MANAGE BLADDER WITH ASSISTANCE Description STG Manage Bladder With max assist  08/09/2017 0411 - Not Progressing by Martina SinnerMurray, Pami Wool A, RN

## 2017-08-09 NOTE — Plan of Care (Signed)
  RH BLADDER ELIMINATION RH STG MANAGE BLADDER WITH ASSISTANCE Description STG Manage Bladder With max assist ; patient incontinent 08/09/2017 1109 - Not Progressing by Pierre BaliLeonar, Shilah Hefel G, RN

## 2017-08-09 NOTE — Progress Notes (Signed)
Occupational Therapy Session Note  Patient Details  Name: Noah RumpfJames Chance Jr. MRN: [id number] Date of Birth: 11/18/1940  Today's Date: 08/09/2017 OT Individual Time: 0900-1000 OT Individual Time Calculation (min): 60 min    Short Term Goals: Week 2:  OT Short Term Goal 1 (Week 2): Pt will maintain sitting balance during functional ADL task with occassional min  A OT Short Term Goal 2 (Week 2): Pt will be able to track to midline to locate ADL item with cues OT Short Term Goal 3 (Week 2): Pt will don shirt with mod A  OT Short Term Goal 4 (Week 2): Pt will demonstration initaition with slide board transfers mat <>w/c OT Short Term Goal 5 (Week 2): Pt will roll toward the left with max A to A with ADLs at bed level  Skilled Therapeutic Interventions/Progress Updates:    OT intervention with focus on bed mobility, orientation, and attention to L. Pt able to rotate head ~10 degrees to L past midline but unable to move eyes past midline or locate any objects/persons to his L.  Pt requires max verbal cues and more than a reasonable amount of time to initiate moving his head to L to located items/persons. Pt continues to require tot A + 2 for bed mobility and to initiate transitional movements.  Pt required max verbal cues for task initiation and following one step commands.  Pt oriented to place but not season of year.  Pt continues to call out for individuals not present in room.  Pt remained in bed with all needs within reach and bed alarm activated.   Therapy Documentation Precautions:  Precautions Precautions: Fall Precaution Comments: severe left neglect Restrictions Weight Bearing Restrictions: No   Pain:  Pt c/o tenderness around 3rd digit of L hand; "buddy taped" and repositioned  See Function Navigator for Current Functional Status.   Therapy/Group: Individual Therapy  Rich BraveLanier, Imogen Maddalena Chappell 08/09/2017, 10:17 AM

## 2017-08-09 NOTE — Progress Notes (Signed)
[place] PHYSICAL MEDICINE & REHABILITATION     PROGRESS NOTE  Subjective/Complaints:  Patient seen lying in bed this morning. He indicates that he slept well overnight. No reported issues overnight. He keeps his eyes closed again today. Discussed functional progress with therapies yesterday.  ROS: limited due to cognitive/behavioral    Objective: Vital Signs: Blood pressure 109/75, pulse 64, temperature 98.3 F (36.8 C), temperature source Oral, resp. rate 18, height 5\' 11"  (1.803 m), weight 123 kg (271 lb 2.7 oz), SpO2 98 %. No results found. No results for input(s): WBC, HGB, HCT, PLT in the last 72 hours. Recent Labs    08/07/17 0549 08/08/17 0534  NA 137 135  K 4.1 4.0  CL 107 105  GLUCOSE 120* 106*  BUN 46* 39*  CREATININE 1.31* 1.17  CALCIUM 9.1 9.1   CBG (last 3)  Recent Labs    08/08/17 1704 08/08/17 2109 08/09/17 0645  GLUCAP 107* 105* 92    Wt Readings from Last 3 Encounters:  08/05/17 123 kg (271 lb 2.7 oz)  07/23/17 124.3 kg (274 lb)  06/29/17 124.3 kg (274 lb)    Physical Exam:  BP 109/75 (BP Location: Left Leg)   Pulse 64   Temp 98.3 F (36.8 C) (Oral)   Resp 18   Ht 5\' 11"  (1.803 m)   Wt 123 kg (271 lb 2.7 oz)   SpO2 98%   BMI 37.82 kg/m  Constitutional: NAD. Obese  HENT: Normocephalic and atraumatic.  Eyes: No discharge Ongoing. Keeps eyes closed Cardiovascular: RRR. No JVD      Respiratory:CTA Bilaterally. Normal effort   GI: Bowel sounds are normal. He exhibits no distension.  Musculoskeletal: Right middle finger with splint, taped to the right ring finger.  Neurological:  Limited by apraxia and ability to follow commands Alert  Left facial weakness with moderate dysarthria Able to follow simple motor commands with max cues.  Motor: RUE 3+/5 proximal to distal  RLE 3-/5 proximal to distal  LUE: 3-/5 proximal to distal LLE: 1/5 proximal to distal Skin: Skin is warm and dry.    Psychiatric: flat/blunt   Assessment/Plan: 1.  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 Physiatrist is providing close team supervision and 24 hour management of active medical problems listed below. Physiatrist and rehab team continue to assess barriers to discharge/monitor patient progress toward functional and medical goals.  Function:  Bathing Bathing position   Position: Bed  Bathing parts Body parts bathed by patient: Abdomen, Chest Body parts bathed by helper: Right arm, Left arm, Front perineal area, Buttocks, Right upper leg, Left upper leg, Back, Left lower leg, Right lower leg, Chest, Abdomen  Bathing assist Assist Level: 2 helpers      Upper Body Dressing/Undressing Upper body dressing   What is the patient wearing?: Pull over shirt/dress       Pull over shirt/dress - Perfomed by helper: Thread/unthread right sleeve, Thread/unthread left sleeve, Put head through opening, Pull shirt over trunk        Upper body assist Assist Level: 2 helpers      Lower Body Dressing/Undressing Lower body dressing   What is the patient wearing?: Hospital Gown       Pants- Performed by helper: Thread/unthread right pants leg, Thread/unthread left pants leg, Pull pants up/down   Non-skid slipper socks- Performed by helper: Don/doff right sock, Don/doff  left sock                  Lower body assist Assist for lower body dressing: 2 Helpers      Toileting Toileting Toileting activity did not occur: No continent bowel/bladder event   Toileting steps completed by helper: Adjust clothing prior to toileting, Performs perineal hygiene, Adjust clothing after toileting    Toileting assist Assist level: Two helpers   Transfers Chair/bed transfer   Chair/bed transfer method: Squat pivot Chair/bed transfer assist level: 2 helpers Chair/bed transfer assistive  device: Mechanical lift Mechanical lift: Maximove   Locomotion Ambulation Ambulation activity did not occur: Safety/medical concerns   Max distance: 10 Assist level: 2 helpers   Wheelchair   Type: Manual   Assist Level: Dependent (Pt equals 0%)(dependently transported to gym)  Cognition Comprehension Comprehension assist level: Understands basic 25 - 49% of the time/ requires cueing 50 - 75% of the time, Understands basic 50 - 74% of the time/ requires cueing 25 - 49% of the time  Expression Expression assist level: Expresses basic 25 - 49% of the time/requires cueing 50 - 75% of the time. Uses single words/gestures., Expresses basis less than 25% of the time/requires cueing >75% of the time.  Social Interaction Social Interaction assist level: Interacts appropriately less than 25% of the time. May be withdrawn or combative., Interacts appropriately 25 - 49% of time - Needs frequent redirection.  Problem Solving Problem solving assist level: Solves basic less than 25% of the time - needs direction nearly all the time or does not effectively solve problems and may need a restraint for safety  Memory Memory assist level: Recognizes or recalls less than 25% of the time/requires cueing greater than 75% of the time    Medical Problem List and Plan: 1.  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   Cont CIR  2.  DVT Prophylaxis/Anticoagulation: start  Pharmaceutical: Lovenox 3. Pain Management: tylenol prn. Added Sportscreme to help with joint stiffness bilateral hands and left knee.    -Right hand middle finger pain, base of fracture nondisplaced, x-ray reviewed    = splint, Ortho follow-up after hospitalization   -Right first MTP pain likely gout. xrays negative   -continue current care 4. Mood: Team to provide encouragement/ego support. LCSW to follow for  evaluation when appropriate.  5. Alzheimer's dementia/ Neuropsych: This patient is not capable of making decisions on his own behalf. Continue Namenda  6. Skin/Wound Care: Pressure relief measures. Maintain adequate nutritional and hydration status.  7. Fluids/Electrolytes/Nutrition: poor intake, a lot of that is cognitive. Encourage to eat/supervision.    continue IVF low rate   prealbumin low, appreciate dietician follow up, push po   daily bmet     added megace   may need to discuss with family re: options for nutrition   Intake remains poor on 3/9 8. T2DM: Monitor BS ac/hs. Continue SSI.    -Relatively controlled on 3/10 CBG (last 3)  Recent Labs    08/08/17 1704 08/08/17 2109 08/09/17 0645  GLUCAP 107* 105* 92   9. HTN: Monitor BP bid--avoid hypotension but needs better control of diastolic bp.      Vitals:   08/08/17 1246 08/09/17 0445  BP: 105/70 109/75  Pulse: 77 64  Resp: 18 18  Temp: 98.4 F (36.9 C) 98.3 F (36.8 C)  SpO2: 100% 98%    Metoprolol  XL to 25 mg daily on 3/6 decreased to 12.5 on 3/10 to avoid hypoperfusion 10. CKD stage III: Monitor renal status. Push fluid intake.    Cr 1.17 on 3/9   Labs pending   Cont to monitor 11. Dyslipidemia: now on Lipitor.   12. Morbid obesity             Body mass index is 38.22 kg/m.             Diet and exercise education 13. Acute blood loss anemia   Hemoglobin 11.9  3/5   Labs ordered for tomorrow    14.  Bowel incontinence this is likely related to his cognitive status, nursing working on achieving continence 15. Bladder: consider removing condom cath although it will likely lead to more incontinence.    16. Cough: Improving   Suspect this is related to dysphagia.     -Continue Robitussin scheduled   -Aspiration precautions  -close clinical observation  LOS (Days) 12 A FACE TO FACE EVALUATION WAS PERFORMED  Noah Bush Karis Juba 08/09/2017 8:02 AM

## 2017-08-10 ENCOUNTER — Inpatient Hospital Stay (HOSPITAL_COMMUNITY): Payer: Medicare Other

## 2017-08-10 ENCOUNTER — Inpatient Hospital Stay (HOSPITAL_COMMUNITY): Payer: Medicare Other | Admitting: Speech Pathology

## 2017-08-10 DIAGNOSIS — E440 Moderate protein-calorie malnutrition: Secondary | ICD-10-CM

## 2017-08-10 LAB — BASIC METABOLIC PANEL
Anion gap: 8 (ref 5–15)
BUN: 32 mg/dL — AB (ref 6–20)
CALCIUM: 9.2 mg/dL (ref 8.9–10.3)
CHLORIDE: 110 mmol/L (ref 101–111)
CO2: 20 mmol/L — ABNORMAL LOW (ref 22–32)
CREATININE: 1.09 mg/dL (ref 0.61–1.24)
Glucose, Bld: 100 mg/dL — ABNORMAL HIGH (ref 65–99)
Potassium: 3.8 mmol/L (ref 3.5–5.1)
SODIUM: 138 mmol/L (ref 135–145)

## 2017-08-10 LAB — GLUCOSE, CAPILLARY
GLUCOSE-CAPILLARY: 98 mg/dL (ref 65–99)
GLUCOSE-CAPILLARY: 99 mg/dL (ref 65–99)
Glucose-Capillary: 111 mg/dL — ABNORMAL HIGH (ref 65–99)
Glucose-Capillary: 94 mg/dL (ref 65–99)

## 2017-08-10 NOTE — Progress Notes (Signed)
Incontinent B&B. Large incontinent BM during night. [facility] Small blister noted to penis after NT removed condom cath, this AM.  Passed on to day shift nurse. Drank 1  nepro at HS and 120cc's water. 1/2 NS infusing at 50cc/hr. Poor initiation. Alfredo MartinezMurray, Nirvaan Frett A

## 2017-08-10 NOTE — Progress Notes (Addendum)
Physical Therapy Note  Patient Details  Name: Noah RumpfJames Anctil Jr. MRN: [id number] Date of Birth: [birth year] Today's Date: 08/10/2017  tx 1:  0800-0900, 60 min individual tx Pain: pt stated that both of his hands hurt from a fall, and grimaced during LLE PROM  tx focused on PROM LLE, rolling L><R +2 for hygiene change, donning pants and to place sling under pt.  Pt followed 1 step commands for movement of any extremity < 50% of requests. He did count aloud upon request,  intermittently ,during passive movements by PT L knee limited PROM contributes to pt's difficulties moving in bed.  MaxiMove +2 to place pt in tilt in space w/c.  Pt left restingin w/c with soft call bell next to R hand, after having several sips of water provided by PT.  tx 2: 1300-1345, 45 min individual tx  Pt asleep in w/c, and remained lethargic throughout session despite wet wash cloth to face, and continued conversation from his grand daughter- in- law, Noah Bush, standing on his L.  Pt required physical assistance to turn head L to attempt to [name].    tx focused on following 1 step commands, bed mobility, L attention, transfer back to bed, PROM bil LEs.  Pt followed 1 step commands for movements of RLe, 50% of time. Pt performed active assistive RLE knee ext,knee flex, ankle pumps.  Use of Maximove for transfer back to bed, +2.  Rolling L><R in bed as above for removing sling. Pt left resting in bed with needs at had, alarm set and Noah Bush in room.   PT informed Noah ParodyCaitlin, LPN regarding foul- smelling urine odor; Noah Bush entering room to change pt.  See function navigator for current status.  Noah Bush 08/10/2017, 7:49 AM

## 2017-08-10 NOTE — Progress Notes (Signed)
[place] PHYSICAL MEDICINE & REHABILITATION     PROGRESS NOTE  Subjective/Complaints:  Pt a little more "weaker" this weekend by report. Pt states he has no new problems. Right hand still sore  ROS: Limited due to cognitive/behavioral   Objective: Vital Signs: Blood pressure (!) 129/91, pulse 66, temperature 97.6 F (36.4 C), temperature source Oral, resp. rate 16, height 5\' 11"  (1.803 m), weight 123 kg (271 lb 2.7 oz), SpO2 98 %. No results found. No results for input(s): WBC, HGB, HCT, PLT in the last 72 hours. Recent Labs    08/08/17 0534 08/09/17 0714  NA 135 137  K 4.0 3.9  CL 105 108  GLUCOSE 106* 102*  BUN 39* 37*  CREATININE 1.17 1.21  CALCIUM 9.1 9.1   CBG (last 3)  Recent Labs    08/09/17 1644 08/09/17 2124 08/10/17 0638  GLUCAP 96 108* 98    Wt Readings from Last 3 Encounters:  08/05/17 123 kg (271 lb 2.7 oz)  07/23/17 124.3 kg (274 lb)  06/29/17 124.3 kg (274 lb)    Physical Exam:  BP (!) 129/91 (BP Location: Left Arm)   Pulse 66   Temp 97.6 F (36.4 C) (Oral)   Resp 16   Ht 5\' 11"  (1.803 m)   Wt 123 kg (271 lb 2.7 oz)   SpO2 98%   BMI 37.82 kg/m  Constitutional: NAD. Obese  HENT: Normocephalic and atraumatic.  Eyes: PERRL Cardiovascular: RRR without murmur. No JVD       Respiratory:CTA Bilaterally without wheezes or rales. Normal effort    GI: Bowel sounds are normal. He exhibits no distension.  Musculoskeletal: Right middle finger with splint, taped to the right ring finger. ---hand generally tender, no abnl swelling Neurological:  Limited by apraxia and ability to follow commands Alert  Left facial weakness with ongoing dysarthria Able to follow simple motor commands with max cues.  Motor: RUE 3+/5 proximal to distal with limitations at left hand RLE 3-/5 proximal to distal  LUE: 2+ to 3/5 proximal to distal LLE: 1/5 proximal to distal---no change Skin: Skin is warm and dry.    Psychiatric: flat but follows simple commands. Limited  insight   Assessment/Plan: 1. 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 Physiatrist is providing close team supervision and 24 hour management of active medical problems listed below. Physiatrist and rehab team continue to assess barriers to discharge/monitor patient progress toward functional and medical goals.  Function:  Bathing Bathing position   Position: Bed  Bathing parts Body parts bathed by patient: Abdomen, Chest Body parts bathed by helper: Right arm, Left arm, Front perineal area, Buttocks, Right upper leg, Left upper leg, Back, Left lower leg, Right lower leg, Chest, Abdomen  Bathing assist Assist Level: 2 helpers      Upper Body Dressing/Undressing Upper body dressing   What is the patient wearing?: Pull over shirt/dress       Pull over shirt/dress - Perfomed by helper: Thread/unthread right sleeve, Thread/unthread left sleeve, Put head through opening, Pull shirt over trunk        Upper body assist Assist Level: 2 helpers      Lower Body Dressing/Undressing Lower body dressing   What is the patient wearing?: Hospital Gown       Pants- Performed by helper: Thread/unthread right pants leg, Thread/unthread left pants leg, Pull pants up/down  Non-skid slipper socks- Performed by helper: Don/doff right sock, Don/doff left sock                  Lower body assist Assist for lower body dressing: 2 Helpers      Toileting Toileting Toileting activity did not occur: No continent bowel/bladder event   Toileting steps completed by helper: Adjust clothing prior to toileting, Performs perineal hygiene, Adjust clothing after toileting    Toileting assist Assist level: Two helpers   Transfers Chair/bed transfer   Chair/bed transfer method: Squat pivot Chair/bed transfer assist level: 2  helpers Chair/bed transfer assistive device: Mechanical lift Mechanical lift: Maximove   Locomotion Ambulation Ambulation activity did not occur: Safety/medical concerns   Max distance: 10 Assist level: 2 helpers   Wheelchair   Type: Manual   Assist Level: Dependent (Pt equals 0%)(dependently transported to gym)  Cognition Comprehension Comprehension assist level: Understands basic 25 - 49% of the time/ requires cueing 50 - 75% of the time, Understands basic 50 - 74% of the time/ requires cueing 25 - 49% of the time  Expression Expression assist level: Expresses basic 25 - 49% of the time/requires cueing 50 - 75% of the time. Uses single words/gestures., Expresses basis less than 25% of the time/requires cueing >75% of the time.  Social Interaction Social Interaction assist level: Interacts appropriately less than 25% of the time. May be withdrawn or combative., Interacts appropriately 25 - 49% of time - Needs frequent redirection.  Problem Solving Problem solving assist level: Solves basic less than 25% of the time - needs direction nearly all the time or does not effectively solve problems and may need a restraint for safety  Memory Memory assist level: Recognizes or recalls less than 25% of the time/requires cueing greater than 75% of the time    Medical Problem List and Plan: 1.  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   Cont CIR---need to determine plan with family, he will require substantial care at discharge 2.  DVT Prophylaxis/Anticoagulation: start  Pharmaceutical: Lovenox 3. Pain Management: tylenol prn. Added Sportscreme to help with joint stiffness bilateral hands and left knee.    -Right hand middle finger pain, base of fracture nondisplaced, x-ray reviewed    = splint, Ortho follow-up after hospitalization---no change in plan   -Right  first MTP pain likely gout. xrays negative     4. Mood: Team to provide encouragement/ego support. LCSW to follow for evaluation when appropriate.  5. Alzheimer's dementia/ Neuropsych: This patient is not capable of making decisions on his own behalf. Continue Namenda  6. Skin/Wound Care: Pressure relief measures. Maintain adequate nutritional and hydration status.  7. Fluids/Electrolytes/Nutrition: poor intake, a lot of that is cognitive. Encourage to eat/supervision.    Continue at IVF low rate   prealbumin low,    daily bmet     added megace, encourage PO   Intake remains poor on 3/10-11----?PEG (d/w family) 8. T2DM: Monitor BS ac/hs. Continue SSI.    -Relatively controlled on 3/10 CBG (last 3)  Recent Labs    08/09/17 1644 08/09/17 2124 08/10/17 0638  GLUCAP 96 108* 98   9. HTN: Monitor BP bid--avoid hypotension but needs better control of diastolic bp.      Vitals:   08/09/17 1322 08/10/17 0526  BP: 117/66 (!) 129/91  Pulse: 73 66  Resp: 18 16  Temp: 98.2 F (36.8 C)  97.6 F (36.4 C)  SpO2: 99% 98%    Metoprolol XL to 25 mg daily on 3/6 decreased to 12.5 on 3/10 to avoid hypoperfusion----bp elevated again today 10. CKD stage III:  .    Cr 1.21 3/10---   Labs pending today, BUN trending down   Continue IVF.  11. Dyslipidemia: now on Lipitor.   12. Morbid obesity             Body mass index is 38.22 kg/m.             Diet and exercise education 13. Acute blood loss anemia   Hemoglobin 11.9  3/5   -labs pending    14.  Bowel incontinence this is likely related to his cognitive status, nursing working on achieving continence 15. Bladder: consider removing condom cath although it will likely lead to more incontinence.    16. Cough: Improved   -Continue Robitussin scheduled   -Aspiration precautions     LOS (Days) 13 A FACE TO FACE EVALUATION WAS PERFORMED  Jamaiyah Pyle T 08/10/2017 8:20 AM

## 2017-08-10 NOTE — Progress Notes (Signed)
Occupational Therapy Session Note  Patient Details  Name: Noah RumpfJames Fanara Jr. MRN: [id number] Date of Birth: [birth year]  Today's Date: 08/10/2017 OT Individual Time: 4132-44010930-1025 OT Individual Time Calculation (min): 55 min    Short Term Goals: Week 2:  OT Short Term Goal 1 (Week 2): Pt will maintain sitting balance during functional ADL task with occassional min  A OT Short Term Goal 2 (Week 2): Pt will be able to track to midline to locate ADL item with cues OT Short Term Goal 3 (Week 2): Pt will don shirt with mod A  OT Short Term Goal 4 (Week 2): Pt will demonstration initaition with slide board transfers mat <>w/c OT Short Term Goal 5 (Week 2): Pt will roll toward the left with max A to A with ADLs at bed level  Skilled Therapeutic Interventions/Progress Updates:    Pt up in TIS w/c upon arrival  OT intervention with focus on following one step commands, attention to L and bring head/eyes to midline, orientation, and attention to task to increase independence with BADLs. Pt required tot A for orientation to place and situation.  Pt required max multimodal cues to turn his head to midline and unable to maintain.  Pt was not able to bring his eyes to midline this day.  Pt requested to wash his face but was unable to locate wash cloth and initiate task even after wash cloth placed in his hand.  Pt remained in w/c with all needs within reach.   Therapy Documentation Precautions:  Precautions Precautions: Fall Precaution Comments: severe left neglect Restrictions Weight Bearing Restrictions: No  Pain:    See Function Navigator for Current Functional Status.   Therapy/Group: Individual Therapy  Rich BraveLanier, Zani Kyllonen Chappell 08/10/2017, 12:16 PM

## 2017-08-10 NOTE — Progress Notes (Signed)
Speech Language Pathology Daily Session Note  Patient Details  Name: Noah RumpfJames Curb Jr. MRN: [id number] Date of Birth: 13-Aug-1940  Today's Date: 08/10/2017 SLP Individual Time: [id number] SLP Individual Time Calculation (min): 45 min  Short Term Goals: Week 2: SLP Short Term Goal 1 (Week 2): Pt will visually scan to midline in 75% of opportunities during basic, familiar tasks with max assist multimodal cues SLP Short Term Goal 2 (Week 2): Pt will sustain his attention to basic, familiar tasks for 1 minute with max verbal cues for redirection.  SLP Short Term Goal 3 (Week 2): Pt will utilize external aids to reorient to place, date, and situation with max assist verbal cues.  SLP Short Term Goal 4 (Week 2): Pt will consume dys 1 textures and thin liquids with min cues for use of swallowing precautions and minimal overt s/s of aspiration.  Skilled Therapeutic Interventions: Skilled treatment session focused on dysphagia and cognition goals. SLP facilitated session by providing skilled observation of pt with lunch tray. Pt appears very frustration/irritated this session as evidenced by frequent cussing. Support given but pt was not able to redirect to any tasks. Pt consumed ~ 10 bites of puree and 5 sips of thin liquids. No overt s/s of aspiration were observed. Pt was able to recall place Noah Bush([place]). Pt was not able to be redirected to any further intake or tasks this session. Pt had some visitors (friends) present and he didn't appear to recognize them. Pt was left upright in wheelchair, safety belt donned and all needs within reach. Continue per current plan of care.      Function:  Eating Eating   Modified Consistency Diet: Yes Eating Assist Level: Helper feeds patient       Helper Brings Food to Mouth: Every scoop   Cognition Comprehension Comprehension assist level: Understands basic less than 25% of the time/ requires cueing >75% of the time  Expression   Expression assist level:  Expresses basis less than 25% of the time/requires cueing >75% of the time.  Social Interaction Social Interaction assist level: Interacts appropriately less than 25% of the time. May be withdrawn or combative.  Problem Solving Problem solving assist level: Solves basic less than 25% of the time - needs direction nearly all the time or does not effectively solve problems and may need a restraint for safety  Memory Memory assist level: Recognizes or recalls less than 25% of the time/requires cueing greater than 75% of the time    Pain    Therapy/Group: Individual Therapy  Nicolae Vasek 08/10/2017, 1:24 PM

## 2017-08-11 ENCOUNTER — Inpatient Hospital Stay (HOSPITAL_COMMUNITY): Payer: Medicare Other | Admitting: Occupational Therapy

## 2017-08-11 ENCOUNTER — Inpatient Hospital Stay (HOSPITAL_COMMUNITY): Payer: Medicare Other | Admitting: Physical Therapy

## 2017-08-11 ENCOUNTER — Ambulatory Visit (HOSPITAL_COMMUNITY): Payer: Medicare Other

## 2017-08-11 LAB — BASIC METABOLIC PANEL
Anion gap: 10 (ref 5–15)
BUN: 30 mg/dL — AB (ref 6–20)
CHLORIDE: 109 mmol/L (ref 101–111)
CO2: 19 mmol/L — AB (ref 22–32)
Calcium: 9.3 mg/dL (ref 8.9–10.3)
Creatinine, Ser: 1.19 mg/dL (ref 0.61–1.24)
GFR calc Af Amer: >60 mL/min (ref >60)
GFR calc non Af Amer: 58 mL/min — ABNORMAL LOW (ref >60)
GLUCOSE: 102 mg/dL — AB (ref 65–99)
POTASSIUM: 4.2 mmol/L (ref 3.5–5.1)
Sodium: 138 mmol/L (ref 135–145)

## 2017-08-11 LAB — CBC
HEMATOCRIT: 36.5 % — AB (ref 39.0–52.0)
HEMOGLOBIN: 11.8 g/dL — AB (ref 13.0–17.0)
MCH: 26.5 pg (ref 26.0–34.0)
MCHC: 32.3 g/dL (ref 30.0–36.0)
MCV: 82 fL (ref 78.0–100.0)
Platelets: 435 10*3/uL — ABNORMAL HIGH (ref 150–400)
RBC: 4.45 MIL/uL (ref 4.22–5.81)
RDW: 14.3 % (ref 11.5–15.5)
WBC: 9.1 10*3/uL (ref 4.0–10.5)

## 2017-08-11 LAB — GLUCOSE, CAPILLARY
GLUCOSE-CAPILLARY: 99 mg/dL (ref 65–99)
Glucose-Capillary: 96 mg/dL (ref 65–99)
Glucose-Capillary: 135 mg/dL — ABNORMAL HIGH (ref 65–99)
Glucose-Capillary: 101 mg/dL — ABNORMAL HIGH (ref 65–99)

## 2017-08-11 MED ORDER — METHYLPHENIDATE HCL 5 MG PO TABS
5.0000 mg | ORAL_TABLET | Freq: Two times a day (BID) | ORAL | Status: DC
  Administered 2017-08-11 – 2017-08-12 (×4): 5 mg via ORAL
  Filled 2017-08-11 (×4): qty 1

## 2017-08-11 MED ORDER — SODIUM CHLORIDE 0.45 % IV SOLN
INTRAVENOUS | Status: DC
  Administered 2017-08-11 – 2017-08-14 (×4): via INTRAVENOUS

## 2017-08-11 MED ORDER — NEPRO/CARBSTEADY PO LIQD
237.0000 mL | Freq: Four times a day (QID) | ORAL | Status: DC
  Administered 2017-08-12 – 2017-08-14 (×5): 237 mL via ORAL

## 2017-08-11 NOTE — Progress Notes (Signed)
Speech Language Pathology Daily Session Note  Patient Details  Name: Noah RumpfJames Schwertner Jr. MRN: [id number] Date of Birth: September 26, 1940  Today's Date: 08/11/2017 SLP Individual Time: 0800-0900 SLP Individual Time Calculation (min): 60 min  Short Term Goals: Week 2: SLP Short Term Goal 1 (Week 2): Pt will visually scan to midline in 75% of opportunities during basic, familiar tasks with max assist multimodal cues SLP Short Term Goal 2 (Week 2): Pt will sustain his attention to basic, familiar tasks for 1 minute with max verbal cues for redirection.  SLP Short Term Goal 3 (Week 2): Pt will utilize external aids to reorient to place, date, and situation with max assist verbal cues.  SLP Short Term Goal 4 (Week 2): Pt will consume dys 1 textures and thin liquids with min cues for use of swallowing precautions and minimal overt s/s of aspiration.  Skilled Therapeutic Interventions: Skilled ST services focused on swallow and cognitive skills. Pt was demonstrating oral holding of secrations upon entering room, pt required Max A multimodal cues to spit secretions following 3 minutes of instruction. SLP notified nursing tech and to preform suction if needed during rounds if oral holding continues and pt is unable to following 1 step directions. SLP offered oral care piror to PO consumption, however pt refused. SLP facilitated PO consumption of dys 1 and thin liquids requiring Total A and consuming mainly thin liquids. Pt demonstrated 2 overt s/s aspiration with immediate cough of thin liquids, suggesting inattention tp bolus in mouth and required removal of straw to utilize small sips to reduce large bolus size. SLP facilitated orientation to place, situation with visual aids and spaced retrieval task for recall, pt required max A verbal cues. Pt perseverated that he was at a carwash. Pt demonstrated sustained attention in 7 minute intervals with multimodal cues. SLP downgraded long term goals to Max A. Pt was left in  bed with alarm set and call bell within reach. Recommend to continue skilled St services.     Function:  Eating Eating   Modified Consistency Diet: Yes Eating Assist Level: Helper feeds patient       Helper Brings Food to Mouth: Every scoop   Cognition Comprehension Comprehension assist level: Understands basic less than 25% of the time/ requires cueing >75% of the time  Expression   Expression assist level: Expresses basis less than 25% of the time/requires cueing >75% of the time.  Social Interaction Social Interaction assist level: Interacts appropriately less than 25% of the time. May be withdrawn or combative.  Problem Solving Problem solving assist level: Solves basic less than 25% of the time - needs direction nearly all the time or does not effectively solve problems and may need a restraint for safety  Memory Memory assist level: Recognizes or recalls less than 25% of the time/requires cueing greater than 75% of the time    Pain Pain Assessment Pain Assessment: No/denies pain  Therapy/Group: Individual Therapy  Zetta Stoneman  [facility] 08/11/2017, 9:02 AM

## 2017-08-11 NOTE — Progress Notes (Signed)
Nutrition Follow-up  DOCUMENTATION CODES:   Obesity unspecified  INTERVENTION:  Provide Nepro Shake po QID, each supplement provides 425 kcal and 19 grams protein.  Provide 30 ml Prostat po TID, each supplement provides 100 kcal and 15 grams of protein.   Potential PEG pending MD discussion with team and family.  NUTRITION DIAGNOSIS:   Inadequate oral intake related to lethargy/confusion as evidenced by meal completion < 25%; ongoing  GOAL:   Patient will meet greater than or equal to 90% of their needs; progressing  MONITOR:   PO intake, Supplement acceptance, Labs, Weight trends, I & O's, Skin, Diet advancement  REASON FOR ASSESSMENT:   Consult Calorie Count  ASSESSMENT:   77 y.o. RH- male  with history of HTN, Alzheimer dementia, CKD III, and T2DM who was admitted on 07/23/17 from MD office with reports of fall, difficulty walking, drooling, right gaze preference and facial droop. 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 Cognitive evaluation revealed severe cognitive communication impairment question baseline but able to follow basic commands with left visual neglect. He continues to have issues with lethargy requiring tactile cues for activity, has significant left oral motor weakness--started on dysphagia 1, thin liquids and severe left neglect with pusher tendency.    Pt continues with lethargy and dysphagia. Meal completion has been 0-25% with 0% intake at lunch today. Pt currently has Nepro shake and Prostat ordered and has been consuming most of them. RD to increase Nepro shake to QID. If pt consumes all of Nepro and Prostat supplements then pt would be meeting 100% of nutrition needs. Potential PEG as pt has been refusing meals and intake has not been improving. MD to discuss with team and family. Megace has been initiated. RD to  continue to monitor.   Labs and medications reviewed.   Diet Order:  DIET - DYS 1 Room service appropriate? Yes; Fluid consistency: Thin  EDUCATION NEEDS:   Not appropriate for education at this time  Skin:  Skin Assessment: Skin Integrity Issues: Skin Integrity Issues:: Other (Comment) Other: non pressure wound to buttocks  Last BM:  3/12  Height:   Ht Readings from Last 1 Encounters:  07/28/17 5\' 11"  (1.803 m)    Weight:   Wt Readings from Last 1 Encounters:  08/05/17 271 lb 2.7 oz (123 kg)    Ideal Body Weight:  78 kg  BMI:  Body mass index is 37.82 kg/m.  Estimated Nutritional Needs:   Kcal:  2050-2250  Protein:  100-115 grams  Fluid:  2- 2.2 L/day    Roslyn SmilingStephanie Elyzabeth Goatley, MS, RD, LDN Pager # [phone] After hours/ weekend pager # [phone]

## 2017-08-11 NOTE — Progress Notes (Signed)
Physical Therapy Session Note  Patient Details  Name: Noah Bush. MRN: [id number] Date of Birth: 05-30-1941  Today's Date: 08/11/2017 PT Individual Time: 1030-1120 and 1500-1530 PT Individual Time Calculation (min): 50 min and 30 min (total 80 min)   Short Term Goals: Week 2:  PT Short Term Goal 1 (Week 2): Pt will perform rolling R/L modA using bedrails PT Short Term Goal 2 (Week 2): Pt will transfer w/c <>bed using LRAD and modA +2 PT Short Term Goal 3 (Week 2): Pt will perform sit <>stand maxA +1 PT Short Term Goal 4 (Week 2): Pt will initiate w/c propulsion   Skilled Therapeutic Interventions/Progress Updates: Tx 1: Pt received seated in w/c, reports to therapist "I just finished going to the bathroom". Returned pt to bed lateral scoot transfer with transfer board to L side with maxA +2, minimal initiation to assist with scooting observed with RUE/RLE. Sit <>supine totalA +2. Rolling R/L totalA +2 with no pt initiation in order to don clean brief and pants, perform hygiene. Sitting balance edge of bed with min>max depending on attention/initiation. Pt demonstrates L lateral lean, resistant to correction towards R side with RUE extended/ trunk extension away from R side. Inconsistently able to follow one step commands for reaching for water at midline, placing hands on therapists chair to facilitate forward trunk flexion and reducing extensor tone. Initiates task ones, but demonstrates motor impersistance. Pt not oriented to place (reports he's at "[place]"), unable to report the year, when asked who the president is he reports "We ain't got one, we're working on getting one". Returned to supine d/t increasing lethargy and decreased participation/initiation. Remained supine in bed, all needs in reach and alarm intact.  Tx 2: Pt received supine in bed, awake but lethargic and slow to initiate verbal responses to therapist, no initiation of following one step commands. Supine>sit totalA +2  with HOB elevated; pt with significant truncal rigidity. StandbyA sitting balance with L lateral lean on EOB. Lateral scoot transfer to R side totalA +2; pt actively resisting transfer to R side with RUE on armrest; attempts to move RUE met with frustration and reaching back for arm rest again. Sitting in w/c, attempted to engage pt in visual scanning to L/midline; able to identify therapy student standing at pt's midline and correctly reports she was wearing a blue shirt while therapist provided max tactile cueing to LUE in order to draw attention to L. Pt able to sustain midline visual orientation <5 seconds; then when asked to identify color of therapy student's pants pt reports "shes not wearing any", and unable to bring pt's visual gaze back to midline even with multimodal cueing. Pt drank small amount of nepro on ice; provided encouragement however pt then resistant to drinking anymore. Remained semi-reclined at nurses station, quick release belt intact at end of session.      Therapy Documentation Precautions:  Precautions Precautions: Fall Precaution Comments: severe left neglect Restrictions Weight Bearing Restrictions: No General: PT Amount of Missed Time (min): 10 Minutes PT Missed Treatment Reason: Patient fatigue Pain: Pain Assessment Pain Assessment: No/denies pain  See Function Navigator for Current Functional Status.   Therapy/Group: Individual Therapy  Luberta Mutter 08/11/2017, 11:37 AM

## 2017-08-11 NOTE — Progress Notes (Signed)
[facility] PHYSICAL MEDICINE & REHABILITATION     PROGRESS NOTE  Subjective/Complaints:  Patient asleep when I arrived.  Slow to awaken.  Denies any pain or complaints.  Nurses report no problems overnight  ROS: Limited due to cognitive/behavioral   Objective: Vital Signs: Blood pressure 121/84, pulse 80, temperature 98.9 F (37.2 C), temperature source Oral, resp. rate 16, height 5\' 11"  (1.803 m), weight 123 kg (271 lb 2.7 oz), SpO2 99 %. No results found. Recent Labs    08/11/17 0551  WBC 9.1  HGB 11.8*  HCT 36.5*  PLT 435*   Recent Labs    08/10/17 0700 08/11/17 0551  NA 138 138  K 3.8 4.2  CL 110 109  GLUCOSE 100* 102*  BUN 32* 30*  CREATININE 1.09 1.19  CALCIUM 9.2 9.3   CBG (last 3)  Recent Labs    08/10/17 1629 08/10/17 2126 08/11/17 0638  GLUCAP 94 99 96    Wt Readings from Last 3 Encounters:  08/05/17 123 kg (271 lb 2.7 oz)  07/23/17 124.3 kg (274 lb)  06/29/17 124.3 kg (274 lb)    Physical Exam:  BP 121/84 (BP Location: Right Arm)   Pulse 80   Temp 98.9 F (37.2 C) (Oral)   Resp 16   Ht 5\' 11"  (1.803 m)   Wt 123 kg (271 lb 2.7 oz)   SpO2 99%   BMI 37.82 kg/m  Constitutional: No distress . Vital signs reviewed. HEENT: EOMI, oral membranes moist Cardiovascular: RRR without murmur. No JVD    Respiratory: CTA Bilaterally without wheezes or rales. Normal effort    GI: BS +, non-tender, non-distended  Musculoskeletal: Right middle finger with splint, taped to the right ring finger. ---hand generally tender, no abnl swelling Neurological:  Limited by apraxia and ability to follow commands Slow to arouse Left facial weakness with ongoing dysarthria Able to follow simple motor commands with max cues.  Motor: RUE 3+/5 proximal to distal with limitations at left hand RLE 3-/5 proximal to distal  LUE: 2+ to 3/5 proximal to distal LLE: 1/5 proximal to distal---no change Skin: Skin is dry,warm   Psychiatric: flat but follows simple commands.  Limited insight   Assessment/Plan: 1. 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 Physiatrist is providing close team supervision and 24 hour management of active medical problems listed below. Physiatrist and rehab team continue to assess barriers to discharge/monitor patient progress toward functional and medical goals.  Function:  Bathing Bathing position   Position: Bed  Bathing parts Body parts bathed by patient: Abdomen, Chest Body parts bathed by helper: Right arm, Left arm, Front perineal area, Buttocks, Right upper leg, Left upper leg, Back, Left lower leg, Right lower leg, Chest, Abdomen  Bathing assist Assist Level: 2 helpers      Upper Body Dressing/Undressing Upper body dressing   What is the patient wearing?: Pull over shirt/dress       Pull over shirt/dress - Perfomed by helper: Thread/unthread right sleeve, Thread/unthread left sleeve, Put head through opening, Pull shirt over trunk        Upper body assist Assist Level: 2 helpers      Lower Body Dressing/Undressing Lower body dressing   What is the patient wearing?: Hospital Gown       Pants- Performed by helper: Thread/unthread right pants leg, Thread/unthread left pants leg, Pull pants up/down  Non-skid slipper socks- Performed by helper: Don/doff right sock, Don/doff left sock                  Lower body assist Assist for lower body dressing: 2 Helpers      Toileting Toileting Toileting activity did not occur: No continent bowel/bladder event   Toileting steps completed by helper: Adjust clothing prior to toileting, Performs perineal hygiene, Adjust clothing after toileting    Toileting assist Assist level: Two helpers   Transfers Chair/bed transfer   Chair/bed transfer method: Other Chair/bed transfer assist level: 2  helpers Chair/bed transfer assistive device: Mechanical lift Mechanical lift: Maximove   Locomotion Ambulation Ambulation activity did not occur: Safety/medical concerns   Max distance: 10 Assist level: 2 helpers   Wheelchair   Type: Manual   Assist Level: Dependent (Pt equals 0%)(dependently transported to gym)  Cognition Comprehension Comprehension assist level: Understands basic less than 25% of the time/ requires cueing >75% of the time  Expression Expression assist level: Expresses basis less than 25% of the time/requires cueing >75% of the time.  Social Interaction Social Interaction assist level: Interacts appropriately less than 25% of the time. May be withdrawn or combative.  Problem Solving Problem solving assist level: Solves basic less than 25% of the time - needs direction nearly all the time or does not effectively solve problems and may need a restraint for safety  Memory Memory assist level: Recognizes or recalls less than 25% of the time/requires cueing greater than 75% of the time    Medical Problem List and Plan: 1.  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   Cont CIR--team conference today 2.  DVT Prophylaxis/Anticoagulation: start  Pharmaceutical: Lovenox 3. Pain Management: tylenol prn. Added Sportscreme to help with joint stiffness bilateral hands and left knee.    -Right hand middle finger pain, base of fracture nondisplaced, x-ray reviewed    = splint, Ortho follow-up after hospitalization--- ongoing discomfort in right hand   -Right first MTP pain likely gout. xrays negative     4. Mood: Team to provide encouragement/ego support. LCSW to follow for evaluation when appropriate.  5. Alzheimer's dementia/ Neuropsych: This patient is not capable of making decisions on his own behalf. Continue Namenda    -Added methylphenidate to  see if we can improve initiation and arousal  6. Skin/Wound Care: Pressure relief measures. Maintain adequate nutritional and hydration status.  7. Fluids/Electrolytes/Nutrition: poor intake, a lot of that is cognitive. Encourage to eat/supervision.    Continue at IVF low rate--will change to nighttime only   prealbumin low, eating next to nothing   daily bmet     added megace, encouraging PO   Need to review potential of PEG with team and family 8. T2DM: Monitor BS ac/hs. Continue SSI.    -Relatively controlled on 3/10 CBG (last 3)  Recent Labs    08/10/17 1629 08/10/17 2126 08/11/17 0638  GLUCAP 94 99 96   9. HTN: Monitor BP bid- .      Vitals:   08/10/17 2130 08/11/17 0514  BP: 117/82 121/84  Pulse: 70 80  Resp:  16  Temp:  98.9 F (37.2 C)  SpO2: 100% 99%    Metoprolol XL to 25 mg daily on 3/6 decreased to 12.5 on 3/10 to avoid hypoperfusion----bp under reasonable control at present.  No changes today 10. CKD stage III:  .  Cr  1.19 today-   BUN continues to trend downward   Will need to continue IVF.  11. Dyslipidemia: now on Lipitor.   12. Morbid obesity             Body mass index is 38.22 kg/m.             Diet and exercise education 13. Acute blood loss anemia   Hemoglobin 11.9  3/5   -Hemoglobin 11.8 14.  Bowel incontinence this is likely related to his cognitive status, nursing working on achieving continence 15. Bladder: consider removing condom cath although it will likely lead to more incontinence.    16. Cough: Improved   -Continue Robitussin scheduled--- change to as needed   -Aspiration precautions     LOS (Days) 14 A FACE TO FACE EVALUATION WAS PERFORMED  SWARTZ,ZACHARY T 08/11/2017 8:14 AM

## 2017-08-11 NOTE — Progress Notes (Signed)
Occupational Therapy Session Note  Patient Details  Name: Noah RumpfJames Madill Jr. MRN: [id number] Date of Birth: May 07, 1941  Today's Date: 08/11/2017 OT Individual Time: 0930-1030 OT Individual Time Calculation (min): 60 min    Short Term Goals: Week 1:  OT Short Term Goal 1 (Week 1): Pt will maintain sitting balance during functional ADL task with occassional min  A OT Short Term Goal 1 - Progress (Week 1): Progressing toward goal OT Short Term Goal 2 (Week 1): Pt will be able to track to midline to locate ADL item with cues OT Short Term Goal 2 - Progress (Week 1): Progressing toward goal OT Short Term Goal 3 (Week 1): Pt will demonstration initaition with slide board transfers mat <>w/c OT Short Term Goal 3 - Progress (Week 1): Progressing toward goal OT Short Term Goal 4 (Week 1): Pt will don shirt with mod A  OT Short Term Goal 4 - Progress (Week 1): Progressing toward goal OT Short Term Goal 5 (Week 1): Pt will roll toward the left with max A to A with ADLs at bed level OT Short Term Goal 5 - Progress (Week 1): Progressing toward goal Week 2:  OT Short Term Goal 1 (Week 2): Pt will maintain sitting balance during functional ADL task with occassional min  A OT Short Term Goal 2 (Week 2): Pt will be able to track to midline to locate ADL item with cues OT Short Term Goal 3 (Week 2): Pt will don shirt with mod A  OT Short Term Goal 4 (Week 2): Pt will demonstration initaition with slide board transfers mat <>w/c OT Short Term Goal 5 (Week 2): Pt will roll toward the left with max A to A with ADLs at bed level Week 3:     Skilled Therapeutic Interventions/Progress Updates:    Pt continued to be incontinent of loose bowel in session with inability to hold it [facility] Pt required total A +2 to come to EOB from bed with slightly elevated HOB. Pt with decr alertness today and appeared more lethargic that when seen by this therapist last week. Used SARA to come into standing to  [facility]. PT with difficulty coming into and maintaining standing position in SARA- more difficulty than last week. Pt with inability for pelvis to come into neurtral position with upright trunk. Pt able to void more bowel and urine on BSC - but unable to catch urine due to positioning of body parts. Pt with increased heavy breathing throughout session but pt unable to report how he is feeling. Not oriented to place, time or situation. Kept asking to go home to use the bathroom. Transitioned again with SARA into standing for hygiene and to transfer into w/c. Pt fell asleep when tilted back into w/c.  Therapy Documentation Precautions:  Precautions Precautions: Fall Precaution Comments: severe left neglect Restrictions Weight Bearing Restrictions: No General: General PT Missed Treatment Reason: Patient fatigue Vital Signs: Therapy Vitals Pulse Rate: (!) 55 BP: 107/79 Patient Position (if appropriate): Sitting Oxygen Therapy SpO2: 95 % Pain: Pain Assessment Pain Assessment: No/denies pain ADL: ADL ADL Comments: see functional navigator  See Function Navigator for Current Functional Status.   Therapy/Group: Individual Therapy  Roney MansSmith, Laksh Hinners [facility] 08/11/2017, 11:45 AM

## 2017-08-12 ENCOUNTER — Inpatient Hospital Stay (HOSPITAL_COMMUNITY): Payer: Medicare Other

## 2017-08-12 ENCOUNTER — Inpatient Hospital Stay (HOSPITAL_COMMUNITY): Payer: Medicare Other | Admitting: Occupational Therapy

## 2017-08-12 ENCOUNTER — Ambulatory Visit (HOSPITAL_COMMUNITY): Payer: Medicare Other

## 2017-08-12 ENCOUNTER — Inpatient Hospital Stay (HOSPITAL_COMMUNITY): Payer: Medicare Other | Admitting: Physical Therapy

## 2017-08-12 DIAGNOSIS — R5383 Other fatigue: Secondary | ICD-10-CM

## 2017-08-12 LAB — GLUCOSE, CAPILLARY
GLUCOSE-CAPILLARY: 100 mg/dL — AB (ref 65–99)
GLUCOSE-CAPILLARY: 159 mg/dL — AB (ref 65–99)
GLUCOSE-CAPILLARY: 94 mg/dL (ref 65–99)
Glucose-Capillary: 173 mg/dL — ABNORMAL HIGH (ref 65–99)

## 2017-08-12 MED ORDER — CEFAZOLIN SODIUM-DEXTROSE 2-4 GM/100ML-% IV SOLN
2.0000 g | INTRAVENOUS | Status: AC
  Administered 2017-08-13: 2 g via INTRAVENOUS

## 2017-08-12 NOTE — Progress Notes (Signed)
Spoke with wife regarding PEG placement. Wife willing to have PEG placed for nutritional support. Will consult IR.

## 2017-08-12 NOTE — Progress Notes (Addendum)
Physical Therapy Note  Patient Details  Name: Noah RumpfJames Golightly Jr. MRN: [id number] Date of Birth: 24-Nov-1940 Today's Date: 08/12/2017  1335-1400, 25 min indivudual tx p Pain: none per pt  tx focused on bed mobility +2 and LLE neuro re-ed  In side lying R/L , bil LEs moved off/on bed by PT with request of pt to move "legs back onto bed".  When in R side lying, pt placed R Le back onto bed 50% of requests. In L side lying, pt placed RLE back onto bed 10% of request.    LLE PROM by PT.  Attempted to elicit L hip or knee flex with manual assistance, but pt stated his LLE hurt.   See function navigator for current status.  Noah Bush 08/12/2017, 2:55 PM

## 2017-08-12 NOTE — Progress Notes (Signed)
Speech Language Pathology Weekly Progress and Session Note  Patient Details  Name: Noah Tindel Jr. MRN: [id number] Date of Birth: 09/29/1940  Beginning of progress report period: August 05, 2017 End of progress report period: August 12, 2017  Today's Date: 08/12/2017 SLP Individual Time: 0800-0900 SLP Individual Time Calculation (min): 60 min  Short Term Goals: Week 2: SLP Short Term Goal 1 (Week 2): Pt will visually scan to midline in 75% of opportunities during basic, familiar tasks with max assist multimodal cues SLP Short Term Goal 1 - Progress (Week 2): Not met SLP Short Term Goal 2 (Week 2): Pt will sustain his attention to basic, familiar tasks for 1 minute with max verbal cues for redirection.  SLP Short Term Goal 2 - Progress (Week 2): Met SLP Short Term Goal 3 (Week 2): Pt will utilize external aids to reorient to place, date, and situation with max assist verbal cues.  SLP Short Term Goal 3 - Progress (Week 2): Not met SLP Short Term Goal 4 (Week 2): Pt will consume dys 1 textures and thin liquids with min cues for use of swallowing precautions and minimal overt s/s of aspiration. SLP Short Term Goal 4 - Progress (Week 2): Not met    New Short Term Goals: Week 3: SLP Short Term Goal 1 (Week 3): Pt will sustain his attention to basic, familiar tasks for 10 minute with max verbal cues for redirection.  SLP Short Term Goal 2 (Week 3): Pt will utilize external aids to reorient to place, date, and situation in 50% of opportunties with max assist verbal cues.  SLP Short Term Goal 3 (Week 3): Pt will consume dys 1 textures and thin liquids with min cues for use of swallowing precautions and minimal overt s/s of aspiration. SLP Short Term Goal 4 (Week 3): Pt will demonstrate functional problem solving during ADL tasks in 50% of opportunies with max A multimodal cues.  Weekly Progress Updates: pt demonstrated slow progress meeting 1 out 4 goals, however recent improvement in attention,  safety swallowing and orientation to place with max A verbal cues, suspect due to addition of stimulant medication. SLP adjusted short term goals to met long term goals due to short term of stay, family is working on possible SNF placement or extensive home care. Pt continues to required total and maximum assistance for all ADLs, orientation to place/situation/time, right gaze preference and reduced PO intake. Pt would continue to benefit from skilled ST services in order to maximize functional independence and reduce burden of care upon pending discharge plans.      Intensity:   Frequency:   Duration/Length of Stay:   Treatment/Interventions:     Daily Session  Skilled Therapeutic Interventions: Skilled ST services focused on cognitive and swallow skills. Pt demonstrated increase in alertness, possibly due to recent addition of stimulate medication, verbally expressing wants and needs and eyes open. SLP facilitated orientation to place/siuation and time given multimodal cues, pt demonstrated recall of place with 40% accuracy. SLP facilitated PO consumption of dys 1 and thin liquid requiring total A for feeding of breakfast tray, with no overt s/s aspiration. Pt consumed 1 nepro, 1/2 magic cup and 1/2 pancake, along with various thin liquids (coffee, orange juice and water.) Pt demonstrated scanning to midline with max A multimodal cues in 25% of opportunities during meal. Pt demonstrated verbal initiation of express basic wants/needs, requesting to use the bathroom, although brief was already wet. SLP notified nursing staff of request. Pt was left in   bed with nursing staff present. Recommend to continue skilled ST services.      Function:   Eating Eating   Modified Consistency Diet: Yes Eating Assist Level: Helper feeds patient       Helper Brings Food to Mouth: Every scoop   Cognition Comprehension Comprehension assist level: Understands basic 25 - 49% of the time/ requires cueing 50 -  75% of the time  Expression   Expression assist level: Expresses basic 50 - 74% of the time/requires cueing 25 - 49% of the time. Needs to repeat parts of sentences.  Social Interaction Social Interaction assist level: Interacts appropriately 25 - 49% of time - Needs frequent redirection.  Problem Solving Problem solving assist level: Solves basic less than 25% of the time - needs direction nearly all the time or does not effectively solve problems and may need a restraint for safety  Memory Memory assist level: Recognizes or recalls less than 25% of the time/requires cueing greater than 75% of the time   General    Pain Pain Assessment Pain Assessment: No/denies pain  Therapy/Group: Individual Therapy  Andranik Jeune  [facility] 08/12/2017, 1:27 PM

## 2017-08-12 NOTE — Plan of Care (Addendum)
  Not Applicable [name] will perform car transfers with assist (PT) Description LTG: Patient will perform car transfers with assistance (PT). 08/12/2017 0814 - Not Applicable by [name], [name], Student-PT Flowsheets Taken 08/12/2017 0814  LTG: Pt will perform car transfers with assist: - (D/C d/t not safe at this time) Note D/C d/t not safe at this time  [name] will propel w/c in controlled environment (PT) Description LTG: Patient will propel wheelchair in controlled environment, # of feet with assist (PT) 08/12/2017 [id number] - Not Applicable by [name], [name], Student-PT Flowsheets Taken 08/12/2017 0814  LTG: Pt will propel w/c in controlled environ  assist needed: - (D/C d/t pt in TIS W/C) Note D/C d/t pt in TIS W/C   No Outcome RH Balance LTG Patient will maintain dynamic standing balance (PT) Description LTG:  Patient will maintain dynamic standing balance with assistance during mobility activities (PT) 08/12/2017 0814 by [name], [name], Student-PT Flowsheets Taken 08/12/2017 0814  LTG: Pt will maintain dynamic standing balance during mobility activities with: 2 - Maximum Assistance (downgraded d/t slow progress) Note Downgraded d/t slow progress RH Bed Mobility LTG Patient will perform bed mobility with assist (PT) Description LTG: Patient will perform bed mobility with assistance, with/without cues (PT). 08/12/2017 0814 by [name], [name], Student-PT Flowsheets Taken 08/12/2017 0814  LTG: Pt will perform bed mobility with assistance level of 2-Maximum Assistance (downgraded d/t slow progress) Note downgraded d/t slow progress RH Bed to Chair Transfers LTG Patient will perform bed/chair transfers w/assist (PT) Description LTG: Patient will perform bed/chair transfers with assistance, with/without cues (PT). 08/12/2017 0814 by [name], [name], Student-PT Flowsheets Taken 08/12/2017 0814  LTG: Pt will perform Bed to Chair Transfers  with/without cues and assist:  2 - Maximum Assistance (downgraded d/t slow progress) Note downgraded d/t slow progress

## 2017-08-12 NOTE — Plan of Care (Signed)
  RH BLADDER ELIMINATION RH STG MANAGE BLADDER WITH ASSISTANCE Description STG Manage Bladder With max assist ; incontinent 08/12/2017 1013 - Not Progressing by Pierre BaliLeonar, Deztinee Lohmeyer G, RN

## 2017-08-12 NOTE — Progress Notes (Signed)
[place] PHYSICAL MEDICINE & REHABILITATION     PROGRESS NOTE  Subjective/Complaints:  No new issues or changes over night.  ROS: Limited due to cognitive/behavioral   Objective: Vital Signs: Blood pressure (!) 150/80, pulse 71, temperature 98.2 F (36.8 C), temperature source Oral, resp. rate 18, height 5\' 11"  (1.803 m), weight 123 kg (271 lb 2.7 oz), SpO2 96 %. No results found. Recent Labs    08/11/17 0551  WBC 9.1  HGB 11.8*  HCT 36.5*  PLT 435*   Recent Labs    08/10/17 0700 08/11/17 0551  NA 138 138  K 3.8 4.2  CL 110 109  GLUCOSE 100* 102*  BUN 32* 30*  CREATININE 1.09 1.19  CALCIUM 9.2 9.3   CBG (last 3)  Recent Labs    08/11/17 1637 08/11/17 2104 08/12/17 0627  GLUCAP 101* 99 100*    Wt Readings from Last 3 Encounters:  08/05/17 123 kg (271 lb 2.7 oz)  07/23/17 124.3 kg (274 lb)  06/29/17 124.3 kg (274 lb)    Physical Exam:  BP (!) 150/80 (BP Location: Right Arm)   Pulse 71   Temp 98.2 F (36.8 C) (Oral)   Resp 18   Ht 5\' 11"  (1.803 m)   Wt 123 kg (271 lb 2.7 oz)   SpO2 96%   BMI 37.82 kg/m  Constitutional: No distress . Vital signs reviewed. HEENT: EOMI, oral membranes moist Cardiovascular: RRR without murmur. No JVD    Respiratory: CTA Bilaterally without wheezes or rales. Normal effort    GI: BS +, non-tender, non-distended  Musculoskeletal: Right 3rd/4th fingers splinted, k-tape--hand generally tender, no abnl swelling Neurological:  Limited by apraxia and ability to follow commands Arouses with verbal and tactile stim. Left facial weakness with ongoing dysarthria Able to follow simple motor commands with max cues.  Motor: RUE 3+/5 proximal to distal with limitations at left hand RLE 3-/5 proximal to distal  LUE: 2+ to 3/5 proximal to distal LLE: 1/5 proximal to distal---no change Skin: Skin is dry,warm   Psychiatric: flat but follows simple commands. Limited insight   Assessment/Plan: 1. 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 Physiatrist is providing close team supervision and 24 hour management of active medical problems listed below. Physiatrist and rehab team continue to assess barriers to discharge/monitor patient progress toward functional and medical goals.  Function:  Bathing Bathing position   Position: Bed  Bathing parts Body parts bathed by patient: Abdomen, Chest Body parts bathed by helper: Right arm, Left arm, Front perineal area, Buttocks, Right upper leg, Left upper leg, Back, Left lower leg, Right lower leg, Chest, Abdomen  Bathing assist Assist Level: 2 helpers      Upper Body Dressing/Undressing Upper body dressing   What is the patient wearing?: Pull over shirt/dress       Pull over shirt/dress - Perfomed by helper: Thread/unthread right sleeve, Thread/unthread left sleeve, Put head through opening, Pull shirt over trunk        Upper body assist Assist Level: 2 helpers      Lower Body Dressing/Undressing Lower body dressing   What is the patient wearing?: Hospital Gown       Pants- Performed by helper: Thread/unthread right pants leg, Thread/unthread left pants leg, Pull pants up/down   Non-skid slipper socks- Performed by helper: Don/doff right sock, Don/doff left sock  Lower body assist Assist for lower body dressing: 2 Helpers      Toileting Toileting Toileting activity did not occur: No continent bowel/bladder event   Toileting steps completed by helper: Adjust clothing prior to toileting, Performs perineal hygiene, Adjust clothing after toileting    Toileting assist Assist level: Two helpers   Transfers Chair/bed transfer   Chair/bed transfer method: Lateral scoot Chair/bed transfer assist level: 2 helpers Chair/bed transfer assistive device: Sliding  board Mechanical lift: Maximove   Locomotion Ambulation Ambulation activity did not occur: Safety/medical concerns   Max distance: 10 Assist level: 2 helpers   Wheelchair   Type: Manual   Assist Level: Dependent (Pt equals 0%)(dependently transported to gym)  Cognition Comprehension Comprehension assist level: Understands basic less than 25% of the time/ requires cueing >75% of the time  Expression Expression assist level: Expresses basis less than 25% of the time/requires cueing >75% of the time.  Social Interaction Social Interaction assist level: Interacts appropriately less than 25% of the time. May be withdrawn or combative.  Problem Solving Problem solving assist level: Solves basic less than 25% of the time - needs direction nearly all the time or does not effectively solve problems and may need a restraint for safety  Memory Memory assist level: Recognizes or recalls less than 25% of the time/requires cueing greater than 75% of the time    Medical Problem List and Plan: 1.  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   Cont CIR--   -spoke with son at length re: prognosis, nutritional concerns, etc. He is discussing with mother.  Will decide on PEG by this morning. He will speak with SW also. Likely SNF placement now   -given lack of progress will also re-check CT of head to r/o cerebral edema  given size of stroke. 2.  DVT Prophylaxis/Anticoagulation: start  Pharmaceutical: Lovenox 3. Pain Management: tylenol prn. Added Sportscreme to help with joint stiffness bilateral hands and left knee.    -Right hand middle finger pain, base of fracture nondisplaced     = splint, Ortho follow-up after hospitalization--- ongoing discomfort in right hand   -Right first MTP pain likely gout. xrays negative     4. Mood: Team to provide encouragement/ego support.  LCSW to follow for evaluation when appropriate.  5. Alzheimer's dementia/ Neuropsych: This patient is not capable of making decisions on his own behalf. Continue Namenda    -continue methylphenidate 5mg  bid to see if we can improve initiation and arousal--no changes thus far.    -consider titration to 10mg  6. Skin/Wound Care: Pressure relief measures. Maintain adequate nutritional and hydration status.  7. Fluids/Electrolytes/Nutrition: poor intake, a lot of that is cognitive. Encourage to eat/supervision.    Continue at IVF low rate--will change to nighttime only   prealbumin low, eating next to nothing   daily bmet     added megace, encouraging PO   Have reviewed potential of PEG with team and family 8. T2DM: Monitor BS ac/hs. Continue SSI.    -controlled 3/13 CBG (last 3)  Recent Labs    08/11/17 1637 08/11/17 2104 08/12/17 0627  GLUCAP 101* 99 100*   9. HTN: Monitor BP bid- .      Vitals:   08/11/17 2110 08/12/17 0435  BP: (!) 172/65 (!) 150/80  Pulse: 80 71  Resp:  18  Temp:  98.2 F (36.8 C)  SpO2: 97% 96%  Metoprolol XL to 25 mg daily on 3/6 decreased to 12.5 on 3/10 to avoid hypoperfusion----some elevation but no change indicated today 10. CKD stage III:  .    Cr  1.19     BUN continues to trend downward   Will need to continue IVF unless PEG placed 11. Dyslipidemia: now on Lipitor.   12. Morbid obesity             Body mass index is 38.22 kg/m.             Diet and exercise education 13. Acute blood loss anemia   Hemoglobin 11.9  3/5   -Hemoglobin 11.8 14.  Bowel incontinence this is likely related to his cognitive status, nursing working on achieving continence 15. Bladder: consider removing condom cath although it will likely lead to more incontinence.    16. Cough: Improved   -Continue Robitussin scheduled--- change to as needed   -Aspiration precautions     LOS (Days) 15 A FACE TO FACE EVALUATION WAS PERFORMED  SWARTZ,ZACHARY T 08/12/2017 8:16 AM

## 2017-08-12 NOTE — Progress Notes (Signed)
Occupational Therapy Weekly Progress Note  Patient Details  Name: Noah Bush. MRN: [id number] Date of Birth: 1940/12/27  Beginning of progress report period: June 07, 2017 End of progress report period: June 15, 2007  Today's Date: 08/12/2017 OT Individual Time: 0240-9735 OT Individual Time Calculation (min): 60 min    Patient has met 0 of 5 short term goals.  Pt continues to require total A of 2 for rolling in bed, sitting at EOB, slide board transfers.  He continues to have confusion, limited orientation, severely impaired vision, stiffness and weakness in all extremities and his trunk.  Patient continues to demonstrate the following deficits: muscle weakness and muscle joint tightness, motor apraxia, decreased visual acuity, decreased visual perceptual skills, decreased visual motor skills and hemianopsia, decreased attention to left and left side neglect, decreased initiation, decreased awareness, decreased memory and delayed processing and decreased sitting balance and decreased postural control and therefore will continue to benefit from skilled OT intervention to enhance overall performance with BADL and Reduce care partner burden.  Patient not progressing toward long term goals.  See goal revision..  Continue plan of care. Pt's progress will be evaluated next week and at that time LTGs may need to be downgraded from mod A to total A of 1 from his current status of total A of 2.    OT Short Term Goals Week 1:  OT Short Term Goal 1 (Week 1): Pt will maintain sitting balance during functional ADL task with occassional min  A OT Short Term Goal 1 - Progress (Week 1): Progressing toward goal OT Short Term Goal 2 (Week 1): Pt will be able to track to midline to locate ADL item with cues OT Short Term Goal 2 - Progress (Week 1): Progressing toward goal OT Short Term Goal 3 (Week 1): Pt will demonstration initaition with slide board transfers mat <>w/c OT Short Term Goal 3 -  Progress (Week 1): Progressing toward goal OT Short Term Goal 4 (Week 1): Pt will don shirt with mod A  OT Short Term Goal 4 - Progress (Week 1): Progressing toward goal OT Short Term Goal 5 (Week 1): Pt will roll toward the left with max A to A with ADLs at bed level OT Short Term Goal 5 - Progress (Week 1): Progressing toward goal Week 2:  OT Short Term Goal 1 (Week 2): Pt will maintain sitting balance during functional ADL task with occassional min  A OT Short Term Goal 1 - Progress (Week 2): Not met OT Short Term Goal 2 (Week 2): Pt will be able to track to midline to locate ADL item with cues OT Short Term Goal 2 - Progress (Week 2): Not met OT Short Term Goal 3 (Week 2): Pt will don shirt with mod A  OT Short Term Goal 3 - Progress (Week 2): Not met OT Short Term Goal 4 (Week 2): Pt will demonstration initaition with slide board transfers mat <>w/c OT Short Term Goal 4 - Progress (Week 2): Not met OT Short Term Goal 5 (Week 2): Pt will roll toward the left with max A to A with ADLs at bed level OT Short Term Goal 5 - Progress (Week 2): Not met Week 3:  OT Short Term Goal 1 (Week 3): Pt will roll to the R in bed with max A. OT Short Term Goal 2 (Week 3): Pt will roll to the L in bed with max A. OT Short Term Goal 3 (Week 3): Pt will demonstrate  improved use of UE by washing face and chest with washcloth with mod VC. OT Short Term Goal 4 (Week 3): Pt will be able to sit upright at EOB with mod A.   Skilled Therapeutic Interventions/Progress Updates:    Pt seen for ADL training with a focus on basic mobility, orientation, cognitive focus.  Pt was not oriented and continually asked the rehab tech questions as if he was his cousin. Attempted several times to reorient pt.  Pt having great difficulty visually focusing on anything and only looked to left when his head was manually turned and pt cued to look to the L.  LB bathing and donning of new brief and pants in bed with total A of 2.  Pt  stated he needed to toilet.  Clothing doffed and pt transferred to [facility] with [place].  Due to forward pelvis position, pt's bottom not fully over seat.  Manually scooted one hip back at a time.  Pt sat with back support with S for several minutes but then did not need to toilet.   Pants donned over feet for pt. Pt stood with total x 2 while nurse tech pulled pt's pants up.  Pt sat back to [facility].  Used maxilift to transfer to TIS w/c.  Nurse techs assisted with leg rests and donning quick release belt and returned pt to nursing station.  Therapy Documentation Precautions:  Precautions Precautions: Fall Precaution Comments: severe left neglect Restrictions Weight Bearing Restrictions: No   Pain: no c/o pain   ADL: ADL ADL Comments: see functional navigator  See Function Navigator for Current Functional Status.   Therapy/Group: Individual Therapy  [place] 08/12/2017, 1:13 PM

## 2017-08-12 NOTE — Patient Care Conference (Signed)
Inpatient RehabilitationTeam Conference and Plan of Care Update Date: 08/11/2017   Time: 2:05 PM    Patient Name: Noah Bush.      Medical Record Number: [id number]  Date of Birth: 12-12-1940 Sex: Male         Room/Bed: 4W05C/4W05C-01 Payor Info: Payor: Multimedia programmer / Plan: [employer] MEDICARE / Product Type: *No Product type* /    Admitting Diagnosis: CVA  Admit Date/Time:  07/28/2017  6:29 PM Admission Comments: No comment available   Primary Diagnosis:  <principal problem not specified> Principal Problem: <principal problem not specified>  Patient Active Problem List   Diagnosis Date Noted  . Cough   . Hypotension due to drugs   . Finger pain, right   . Poor nutrition   . Acute blood loss anemia   . Benign essential HTN   . 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 Acute ischemic right MCA stroke (HCC) 07/28/2017  . Diabetes mellitus type 2 in obese (HCC)   . Stage 3 chronic kidney disease (HCC)   . Morbid obesity (HCC)   . Cerebral infarction (HCC) 07/27/2017  . Hyperlipidemia   . Essential hypertension   . 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 Asymmetry of cerebral ventricles, Acute, Acquired  07/23/2017  . Encounter for screening colonoscopy 03/12/2015    Expected Discharge Date: Expected Discharge Date: (anticipate SNF)  Team Members Present: Physician leading conference: Dr. Faith Rogue Social Worker Present: Staci Acosta, LCSW Nurse Present: Kennyth Arnold, RN PT Present: Alyson Reedy, PT OT Present: Callie Fielding, OT SLP Present: Colin Benton, SLP PPS Coordinator present : Tora Duck, RN, CRRN     Current Status/Progress Goal Weekly Team Focus  Medical   slow functional and neurological progress. po intake poor. ivf supplementation, dysphagia  improve nutrition and arousal  manage conditions above. treat bp   Bowel/Bladder   incontinent of bowel and bladder          Swallow/Nutrition/ Hydration   patient prefers nepro  refuses meal  Max A - downgraded  increase PO intake, attempting self feeding   ADL's   tot A+2 for all self care, bed mobility, sitting balance; maxilift for transfers; max verbal cues for attention to L and turn his head to midline; max A for orientation  mod A overall  sitting balance, bed mobility, visual scanning, orientation,, awareness, functional use of BUE   Mobility   totalA bed mobility, max/totalA +2 transfers with slideboard, participation/initiation regressing with decreased arousal  min A sitting balance, mod A standing balance, modA bed mobility/transfers, minA W/C mobility  bed mobility, postural training, activity tolerance, participation in therapy sessions   Communication             Safety/Cognition/ Behavioral Observations  Total -max A  Max A - downgraded  sustained attention, basic problem solving and orientation/intellectual awareness   Pain   no complaints of pain          Skin   MASD healed periarea          Rehab Goals Patient on target to meet rehab goals: No Rehab Goals Revised: several therapy goals have been downgraded *See Care Plan and progress notes for long and short-term goals.     Barriers to Discharge  Current Status/Progress Possible Resolutions Date Resolved   Physician    Medical stability        may need transfer to another venue of care, ?family ed      Nursing  PT                    OT                  SLP                SW                Discharge Planning/Teaching Needs:  Pt to return home with son and grandson to provide physical care vs. SNF if care needs are too great for caregiving at home.  Family education to be completed closer to d/c with pt's son and grandson, as needed.   Team Discussion:  Dr. Riley KillSwartz started pt on Ritalin to see if this helps him with his rehab progress.  Pt will need substantial care at home vs. SNF care.  Pt is not eating well, so PEG may be necessary.  Dr.  Riley KillSwartz to talk with pt's family about this.  Pt is on an appetite stimulator and is still only at 10-15% intake.  Pt is total A with ST, oral holding of secretions requiring max A cueing.  Pt's ST goals downgraded to max A, as are PT goals.  Pt is having large BMs with PT, impacting their time to work with pt.  Pt is total A for self care with OT, as he does not have functional use of either hand at this time.  Revisions to Treatment Plan:  none    Continued Need for Acute Rehabilitation Level of Care: The patient requires daily medical management by a physician with specialized training in physical medicine and rehabilitation for the following conditions: Daily direction of a multidisciplinary physical rehabilitation program to ensure safe treatment while eliciting the highest outcome that is of practical value to the patient.: Yes Daily medical management of patient stability for increased activity during participation in an intensive rehabilitation regime.: Yes Daily analysis of laboratory values and/or radiology reports with any subsequent need for medication adjustment of medical intervention for : Neurological problems;Nutritional problems  Noah Bush, Noah Bush 08/12/2017, 1:58 PM

## 2017-08-12 NOTE — Progress Notes (Signed)
Physical Therapy Session Note  Patient Details  Name: Noah RumpfJames Twaddle Jr. MRN: [id number] Date of Birth: [birth year]  Today's Date: 08/12/2017 PT Individual Time: 1300-1405 PT Individual Time Calculation (min): 65 min   Short Term Goals: Week 2:  PT Short Term Goal 1 (Week 2): Pt will perform rolling R/L modA using bedrails PT Short Term Goal 2 (Week 2): Pt will transfer w/c <>bed using LRAD and modA +2 PT Short Term Goal 3 (Week 2): Pt will perform sit <>stand maxA +1 PT Short Term Goal 4 (Week 2): Pt will initiate w/c propulsion   Skilled Therapeutic Interventions/Progress Updates: Pt received seated in w/c at nurses station, denies pain and agreeable to treatment. Pt more alert today, more participatory during session. Transferred totalA to gym. Lateral scoot transfer w/c <>mat table maxA +2. Pt engaged in RUE reaching task to retrieve playing cards from therapist on R side and midline; requires totalA cueing to correctly match card onto board at midline d/t poor L visual scanning/L inattention. Pt able to correctly identify playing cards color/suit with 75% accuracy. Pt coughs significantly following small sip of water; provided drink without straw for remainder of session with no additional episodes of coughing noted, and RN alerted. Sit <>stand x4 trials with "three muskateers" assist and maxA overall; improving initiation of push through RLE however poor trunk extension/glute activation in standing to reach full extension and pt perseverative on sitting back down. Pt incontinent of bowel during session. Returned to w/c as above. TotalA transfer w/c>bed with maximove; NT alerted to pt incontinence. Remained in bed, all needs in reach and RN present at end of session.      Therapy Documentation Precautions:  Precautions Precautions: Fall Precaution Comments: severe left neglect Restrictions Weight Bearing Restrictions: No Pain: Pain Assessment Pain Assessment: No/denies pain   See  Function Navigator for Current Functional Status.   Therapy/Group: Individual Therapy  Noah Bush 08/12/2017, 2:07 PM

## 2017-08-12 NOTE — Progress Notes (Signed)
Social Work Patient ID: Noah RumpfJames Hinds Jr., male   DOB: 1941-04-12, 77 y.o.   MRN: [id number]   CSW spoke with pt's wife to update her on conference discussion and she stated her son had spoken to Dr. Riley KillSwartz yesterday about a PEG tube due to pt's poor intake.  Wife stated that the family discussed this and wants to proceed with PEG.  CSW gave phone to PA, Noah Bush, and she spoke further with wife about this and gave her information about procedure and answered questions.  CSW talked with wife about their plan for pt at d/c.  She stated that family is still discussing this.  They want pt to come home, but want him to get the most therapy he can have for the best outcome.  She wants to see how pt does with PEG and then make some decisions as a family.  CSW offered support and will f/u with them next week.  She was appreciative.  CSW tried to update pt, as well.  CSW will continue to follow and assist as needed.

## 2017-08-12 NOTE — Progress Notes (Signed)
Chief Complaint: Patient was seen in consultation today for gastrostomy tube placement at the request of Dr. Faith Rogue  Referring Physician(s): Dr. Faith Rogue  Supervising Physician: Ruel Favors  Patient Status: [facility] - In-pt  History of Present Illness: Noah Bush. is a 77 y.o. male who suffered significant CVA on 2/21. Has significant deficit and was admitted to inpatient rehab. Due to his deficits and dementia, he has poor po intake and is not meeting his dietary needs. After family discussion, they have decided to pursue feeding tube. Chart, PMHx, meds, labs, imaging reviewed. Pt on daily ASA and Plavix per Neurology due to MCA stenosis and risk of further CVA. CT abdomen from a few weeks ago reviewed, anatomy appears amenable for perc access. No prior abdominal surgery.  Past Medical History:  Diagnosis Date  . Alzheimer's dementia    [name] 07/23/2017  . Cerebral infarction (HCC) 07/27/2017  . Cerebrovascular accident (CVA) (HCC) 07/23/2017  . CKD (chronic kidney disease), stage III (HCC)    [name] 07/23/2017  . Gout   . Hypertension   . Type II diabetes mellitus (HCC)    [name] 07/23/2017    Past Surgical History:  Procedure Laterality Date  . COLONOSCOPY WITH PROPOFOL N/A 05/09/2015   Procedure: COLONOSCOPY WITH PROPOFOL;  Surgeon: [name], MD;  Location: [facility] ENDOSCOPY;  Service: Endoscopy;  Laterality: N/A;  . TIBIA FRACTURE SURGERY Left     Allergies: Patient has no known allergies.  Medications:  Current Facility-Administered Medications:  .  0.45 % sodium chloride infusion, , Intravenous, Continuous, Ranelle Oyster, MD, Stopped at 08/12/17 [phone] .  acetaminophen (TYLENOL) tablet 325-650 mg, 325-650 mg, Oral, Q4H PRN, Jacquelynn Cree, PA-C, 650 mg at 08/06/17 2000 .  albuterol (PROVENTIL) (2.5 MG/3ML) 0.083% nebulizer solution 2.5 mg, 2.5 mg, Nebulization, Q6H PRN, Love, Pamela S, PA-C .  alum & mag hydroxide-simeth  (MAALOX/MYLANTA) 200-200-20 MG/5ML suspension 30 mL, 30 mL, Oral, Q4H PRN, Love, Pamela S, PA-C .  aspirin EC tablet 81 mg, 81 mg, Oral, Daily, Love, Pamela S, PA-C, 81 mg at 08/12/17 0855 .  atorvastatin (LIPITOR) tablet 80 mg, 80 mg, Oral, q1800, Love, Pamela S, PA-C, 80 mg at 08/11/17 1802 .  bisacodyl (DULCOLAX) suppository 10 mg, 10 mg, Rectal, Daily PRN, Jacquelynn Cree, PA-C, 10 mg at 07/30/17 0806 .  clopidogrel (PLAVIX) tablet 75 mg, 75 mg, Oral, Daily, Love, Pamela S, PA-C, 75 mg at 08/12/17 0855 .  diphenhydrAMINE (BENADRYL) 12.5 MG/5ML elixir 12.5-25 mg, 12.5-25 mg, Oral, Q6H PRN, Love, Pamela S, PA-C .  enoxaparin (LOVENOX) injection 40 mg, 40 mg, Subcutaneous, Q24H, Ranelle Oyster, MD, 40 mg at 08/11/17 2107 .  feeding supplement (NEPRO CARB STEADY) liquid 237 mL, 237 mL, Oral, QID, Ranelle Oyster, MD, 237 mL at 08/12/17 1146 .  feeding supplement (PRO-STAT SUGAR FREE 64) liquid 30 mL, 30 mL, Oral, TID BM, Ranelle Oyster, MD, 30 mL at 08/12/17 1404 .  insulin aspart (novoLOG) injection 0-9 Units, 0-9 Units, Subcutaneous, TID WC, Love, Pamela S, PA-C, 2 Units at 08/12/17 1146 .  latanoprost (XALATAN) 0.005 % ophthalmic solution 1 drop, 1 drop, Left Eye, QHS, Love, Pamela S, PA-C, 1 drop at 08/11/17 2109 .  megestrol (MEGACE) 400 MG/10ML suspension 400 mg, 400 mg, Oral, BID, Ranelle Oyster, MD, 400 mg at 08/12/17 0855 .  memantine (NAMENDA) tablet 5 mg, 5 mg, Oral, Daily, Love, Pamela S, PA-C, 5 mg at 08/12/17 0855 .  methylphenidate (RITALIN) tablet  5 mg, 5 mg, Oral, BID WC, Ranelle OysterSwartz, Zachary T, MD, 5 mg at 08/12/17 1146 .  metoprolol succinate (TOPROL-XL) 24 hr tablet 12.5 mg, 12.5 mg, Oral, Daily, Marcello FennelPatel, Ankit Anil, MD, 12.5 mg at 08/12/17 0855 .  multivitamin (RENA-VIT) tablet 1 tablet, 1 tablet, Oral, QHS, Love, Pamela S, PA-C, 1 tablet at 08/11/17 2108 .  MUSCLE RUB CREA, , Topical, QID, Love, Pamela S, PA-C .  polyethylene glycol (MIRALAX / GLYCOLAX) packet 17 g, 17 g,  Oral, BID, Kirsteins, Victorino SparrowAndrew E, MD, 17 g at 08/10/17 0813 .  prochlorperazine (COMPAZINE) tablet 5-10 mg, 5-10 mg, Oral, Q6H PRN, 5 mg at 07/30/17 0806 **OR** prochlorperazine (COMPAZINE) injection 5-10 mg, 5-10 mg, Intramuscular, Q6H PRN **OR** prochlorperazine (COMPAZINE) suppository 12.5 mg, 12.5 mg, Rectal, Q6H PRN, Love, Pamela S, PA-C .  sodium phosphate (FLEET) 7-19 GM/118ML enema 1 enema, 1 enema, Rectal, Once PRN, Love, Pamela S, PA-C .  traZODone (DESYREL) tablet 25-50 mg, 25-50 mg, Oral, QHS PRN, Love, Pamela S, PA-C    Family History  Problem Relation Age of Onset  . Diabetes Paternal Grandfather     Social History   Socioeconomic History  . Marital status: Married    Spouse name: None  . Number of children: None  . Years of education: None  . Highest education level: None  Social Needs  . Financial resource strain: None  . Food insecurity - worry: None  . Food insecurity - inability: None  . Transportation needs - medical: None  . Transportation needs - non-medical: None  Occupational History  . None  Tobacco Use  . Smoking status: Never Smoker  . Smokeless tobacco: Never Used  Substance and Sexual Activity  . Alcohol use: Yes    Alcohol/week: 1.2 oz    Types: 2 Cans of beer per week  . Drug use: No  . Sexual activity: None  Other Topics Concern  . None  Social History Narrative  . None     Review of Systems: A 12 point ROS discussed and pertinent positives are indicated in the HPI above.  All other systems are negative.  Review of Systems  Vital Signs: BP (!) 150/80 (BP Location: Right Arm)   Pulse 71   Temp 98.2 F (36.8 C) (Oral)   Resp 18   Ht 5\' 11"  (1.803 m)   Wt 271 lb 2.7 oz (123 kg)   SpO2 96%   BMI 37.82 kg/m   Physical Exam  Constitutional: He appears well-developed. No distress.  HENT:  Head: Normocephalic.  Mouth/Throat: Oropharynx is clear and moist.  Neck: Normal range of motion. No JVD present.  Cardiovascular: Normal rate,  regular rhythm and normal heart sounds.  Pulmonary/Chest: Effort normal and breath sounds normal. No respiratory distress.  Abdominal: Soft. He exhibits no distension. There is no tenderness.  Neurological:  Pt awake and responsive. Albeit short answers. Some confusion but recalls conversation about feeding tube placement  Skin: Skin is warm and dry.     Imaging: Ct Angio Head W Or Wo Contrast  Result Date: 07/23/2017 CLINICAL DATA:  Stroke. EXAM: CT ANGIOGRAPHY HEAD AND NECK CT PERFUSION BRAIN TECHNIQUE: Multidetector CT imaging of the head and neck was performed using the standard protocol during bolus administration of intravenous contrast. Multiplanar CT image reconstructions and MIPs were obtained to evaluate the vascular anatomy. Carotid stenosis measurements (when applicable) are obtained utilizing NASCET criteria, using the distal internal carotid diameter as the denominator. Multiphase CT imaging of the brain was performed following  IV bolus contrast injection. Subsequent parametric perfusion maps were calculated using RAPID software. CONTRAST:  90mL ISOVUE-370 IOPAMIDOL (ISOVUE-370) INJECTION 76% COMPARISON:  CT head 07/23/2017 FINDINGS: CTA NECK FINDINGS Aortic arch: Atherosclerotic disease in the aortic arch. 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 Ascending aorta measures 37 mm in diameter. Atherosclerotic disease in the proximal great vessels which are patent. Right carotid system: Calcified plaque right carotid bifurcation. Less than 25% diameter stenosis right internal carotid artery at the origin. Left carotid system: Atherosclerotic disease at the proximal left common carotid artery which is mildly narrowed. Atherosclerotic calcification left carotid bifurcation with less than 25% diameter stenosis. Diffusely diseased left common carotid artery Vertebral arteries: Right vertebral artery dominant and patent to the basilar. Calcified plaque at the origin. Small left vertebral  artery ends in PICA. Skeleton: Cervical degenerative change. No acute skeletal abnormality. Other neck: Negative for mass or adenopathy. Upper chest: Negative Review of the MIP images confirms the above findings CTA HEAD FINDINGS Anterior circulation: Cavernous carotid is heavily calcified bilaterally with moderate stenosis right greater than left Moderate stenosis proximal right M1 segment. Moderate disease right middle cerebral artery bifurcation with moderate stenosis at the origin of the posterior division right MCA. Anterior division of the right middle cerebral artery irregular but patent. Mild stenosis left M1 segment. Moderate stenosis distal left M1. Left middle cerebral artery is patent with scattered atherosclerotic disease. Both anterior cerebral arteries patent Posterior circulation: Mild stenosis distal right vertebral artery which supplies the basilar. Left vertebral artery ends in PICA. PICA patent bilaterally. Basilar widely patent. Superior cerebellar arteries patent bilaterally. Fetal origin right posterior cerebral artery. Occlusion distal right posterior cerebral artery. Diffuse atherosclerotic disease left posterior cerebral artery. Venous sinuses: Patent Anatomic variants: None Delayed phase: Not perform Review of the MIP images confirms the above findings CT Brain Perfusion Findings: CBF (<30%) Volume: 28mL Perfusion (Tmax>6.0s) volume: 31mL Mismatch Volume: 3mL Infarction Location:Right MCA territory involving the right temporal lobe and right parietal lobe. CT perfusion volume is smaller than that seen on the recent head CT. This may be due to subacute infarct and pseudo normalization of the CT perfusion. Overall, the CT scan is felt to be more accurate representation of the large territory right MCA infarct. IMPRESSION: Heavily calcified carotid bifurcation bilaterally with mild stenosis bilaterally. Heavily calcified moderate stenosis of the cavernous carotid bilaterally. Moderate  stenosis proximal right M1 segment and distal right middle cerebral artery. Right middle cerebral arteries are diseased but patent. Mild left middle cerebral artery stenosis proximally and moderate stenosis distal left middle cerebral artery. Diffuse intracranial atherosclerotic disease with occlusion of the right posterior cerebral artery and diffuse disease in the left posterior cerebral artery. CT perfusion compatible with subacute infarct right MCA territory. CT perfusion under represents the size of the infarct based on CT Head compatible with subacute infarct and pseudo normalization. These results were reviewed in person at the time of interpretation on 07/23/2017 at 2:21 pm to Dr. Delia Heady , who verbally acknowledged these results. Electronically Signed   By: Marlan Palau M.D.   On: 07/23/2017 14:23   Ct Head Wo Contrast  Result Date: 08/12/2017 CLINICAL DATA:  Altered level of consciousness. Recent large right MCA territory infarct. EXAM: CT HEAD WITHOUT CONTRAST TECHNIQUE: Contiguous axial images were obtained from the base of the skull through the vertex without intravenous contrast. COMPARISON:  MRI brain 07/24/2017 FINDINGS: Brain: Expected evolution of the large right MCA territory infarct is evident. There is now volume loss. Previously-seen  midline shift has resolved. Partial effacement of the right lateral ventricle has partially resolved. Cortical hyperdensity reflects petechial hemorrhages previously noted. Some of this hyperdensity may represent cortical laminar necrosis. No new infarct or hemorrhage is present. There are still some mass effect on the atrium of the right lateral ventricle. No significant extra-axial fluid collection is present. Vascular: Atherosclerotic calcifications are present in the cavernous internal carotid arteries bilaterally. There is no hyperdense vessel. Skull: Calvarium is intact. No focal lytic or blastic lesions are present. Sinuses/Orbits: Maxillary sinus  wall thickening is present bilaterally without significant coastal disease. The paranasal sinuses are otherwise clear. The mastoid air cells are clear. Scleral banding is noted at the right globe. Globes and orbits are otherwise normal. IMPRESSION: 1. Expected evolution of large right MCA territory infarct. 2. Stable areas of hyperdensity compatible with petechial hemorrhage. 3. No new hemorrhage or significant expansion of the infarct. 4. Decreasing mass effect. Electronically Signed   By: Marin Roberts M.D.   On: 08/12/2017 10:29   Ct Angio Neck W Or Wo Contrast  Result Date: 07/23/2017 CLINICAL DATA:  Stroke. EXAM: CT ANGIOGRAPHY HEAD AND NECK CT PERFUSION BRAIN TECHNIQUE: Multidetector CT imaging of the head and neck was performed using the standard protocol during bolus administration of intravenous contrast. Multiplanar CT image reconstructions and MIPs were obtained to evaluate the vascular anatomy. Carotid stenosis measurements (when applicable) are obtained utilizing NASCET criteria, using the distal internal carotid diameter as the denominator. Multiphase CT imaging of the brain was performed following IV bolus contrast injection. Subsequent parametric perfusion maps were calculated using RAPID software. CONTRAST:  90mL ISOVUE-370 IOPAMIDOL (ISOVUE-370) INJECTION 76% COMPARISON:  CT head 07/23/2017 FINDINGS: CTA NECK FINDINGS Aortic arch: Atherosclerotic disease in the aortic arch. 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 Ascending aorta measures 37 mm in diameter. Atherosclerotic disease in the proximal great vessels which are patent. Right carotid system: Calcified plaque right carotid bifurcation. Less than 25% diameter stenosis right internal carotid artery at the origin. Left carotid system: Atherosclerotic disease at the proximal left common carotid artery which is mildly narrowed. Atherosclerotic calcification left carotid bifurcation with less than 25% diameter stenosis. Diffusely  diseased left common carotid artery Vertebral arteries: Right vertebral artery dominant and patent to the basilar. Calcified plaque at the origin. Small left vertebral artery ends in PICA. Skeleton: Cervical degenerative change. No acute skeletal abnormality. Other neck: Negative for mass or adenopathy. Upper chest: Negative Review of the MIP images confirms the above findings CTA HEAD FINDINGS Anterior circulation: Cavernous carotid is heavily calcified bilaterally with moderate stenosis right greater than left Moderate stenosis proximal right M1 segment. Moderate disease right middle cerebral artery bifurcation with moderate stenosis at the origin of the posterior division right MCA. Anterior division of the right middle cerebral artery irregular but patent. Mild stenosis left M1 segment. Moderate stenosis distal left M1. Left middle cerebral artery is patent with scattered atherosclerotic disease. Both anterior cerebral arteries patent Posterior circulation: Mild stenosis distal right vertebral artery which supplies the basilar. Left vertebral artery ends in PICA. PICA patent bilaterally. Basilar widely patent. Superior cerebellar arteries patent bilaterally. Fetal origin right posterior cerebral artery. Occlusion distal right posterior cerebral artery. Diffuse atherosclerotic disease left posterior cerebral artery. Venous sinuses: Patent Anatomic variants: None Delayed phase: Not perform Review of the MIP images confirms the above findings CT Brain Perfusion Findings: CBF (<30%) Volume: 28mL Perfusion (Tmax>6.0s) volume: 31mL Mismatch Volume: 3mL Infarction Location:Right MCA territory involving the right temporal lobe and right parietal  lobe. CT perfusion volume is smaller than that seen on the recent head CT. This may be due to subacute infarct and pseudo normalization of the CT perfusion. Overall, the CT scan is felt to be more accurate representation of the large territory right MCA infarct. IMPRESSION:  Heavily calcified carotid bifurcation bilaterally with mild stenosis bilaterally. Heavily calcified moderate stenosis of the cavernous carotid bilaterally. Moderate stenosis proximal right M1 segment and distal right middle cerebral artery. Right middle cerebral arteries are diseased but patent. Mild left middle cerebral artery stenosis proximally and moderate stenosis distal left middle cerebral artery. Diffuse intracranial atherosclerotic disease with occlusion of the right posterior cerebral artery and diffuse disease in the left posterior cerebral artery. CT perfusion compatible with subacute infarct right MCA territory. CT perfusion under represents the size of the infarct based on CT Head compatible with subacute infarct and pseudo normalization. These results were reviewed in person at the time of interpretation on 07/23/2017 at 2:21 pm to Dr. Delia Heady , who verbally acknowledged these results. Electronically Signed   By: Marlan Palau M.D.   On: 07/23/2017 14:23   Mr Brain Wo Contrast  Result Date: 07/24/2017 CLINICAL DATA:  Follow-up code stroke. Subacute RIGHT MCA infarct. Difficulty walking, facial droop. History of Alzheimer's disease, advanced intracranial atherosclerosis. EXAM: MRI HEAD WITHOUT CONTRAST TECHNIQUE: Multiplanar, multiecho pulse sequences of the brain and surrounding structures were obtained without intravenous contrast. COMPARISON:  CT HEAD July 23, 2017 FINDINGS: INTRACRANIAL CONTENTS: Reduced diffusion and susceptibility artifact RIGHT frontotemporal parietal cortex and, RIGHT basal ganglia with patchy low ADC values RIGHT lenticulostriate nucleus, superior aspect RIGHT frontotemporal parietal cortex. Regional mass effect without midline shift. Mild effacement RIGHT lateral ventricle without LEFT ventricle entrapment. Patchy to confluent supratentorial white matter FLAIR T2 hyperintensities exclusive a aforementioned abnormality. VASCULAR: Normal major intracranial vascular  flow voids present at skull base. SKULL AND UPPER CERVICAL SPINE: No abnormal sellar expansion. No suspicious calvarial bone marrow signal. Craniocervical junction maintained. SINUSES/ORBITS: Trace paranasal sinus mucosal thickening. Mastoid air cells are well aerated. Included ocular globes and orbital contents are non-suspicious. OTHER: None. IMPRESSION: 1. Large acute on subacute RIGHT MCA territory infarct with petechial hemorrhage. 2. Regional RIGHT cerebrum mass effect without midline shift. Mildly effaced RIGHT lateral ventricle without LEFT ventricle entrapment. 3. Moderate chronic small vessel ischemic disease. Electronically Signed   By: Awilda Metro M.D.   On: 07/24/2017 05:19   Ct Cerebral Perfusion W Contrast  Result Date: 07/23/2017 CLINICAL DATA:  Stroke. EXAM: CT ANGIOGRAPHY HEAD AND NECK CT PERFUSION BRAIN TECHNIQUE: Multidetector CT imaging of the head and neck was performed using the standard protocol during bolus administration of intravenous contrast. Multiplanar CT image reconstructions and MIPs were obtained to evaluate the vascular anatomy. Carotid stenosis measurements (when applicable) are obtained utilizing NASCET criteria, using the distal internal carotid diameter as the denominator. Multiphase CT imaging of the brain was performed following IV bolus contrast injection. Subsequent parametric perfusion maps were calculated using RAPID software. CONTRAST:  90mL ISOVUE-370 IOPAMIDOL (ISOVUE-370) INJECTION 76% COMPARISON:  CT head 07/23/2017 FINDINGS: CTA NECK FINDINGS Aortic arch: Atherosclerotic disease in the aortic arch. 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 Ascending aorta measures 37 mm in diameter. Atherosclerotic disease in the proximal great vessels which are patent. Right carotid system: Calcified plaque right carotid bifurcation. Less than 25% diameter stenosis right internal carotid artery at the origin. Left carotid system: Atherosclerotic disease at the  proximal left common carotid artery which is mildly narrowed. Atherosclerotic calcification left  carotid bifurcation with less than 25% diameter stenosis. Diffusely diseased left common carotid artery Vertebral arteries: Right vertebral artery dominant and patent to the basilar. Calcified plaque at the origin. Small left vertebral artery ends in PICA. Skeleton: Cervical degenerative change. No acute skeletal abnormality. Other neck: Negative for mass or adenopathy. Upper chest: Negative Review of the MIP images confirms the above findings CTA HEAD FINDINGS Anterior circulation: Cavernous carotid is heavily calcified bilaterally with moderate stenosis right greater than left Moderate stenosis proximal right M1 segment. Moderate disease right middle cerebral artery bifurcation with moderate stenosis at the origin of the posterior division right MCA. Anterior division of the right middle cerebral artery irregular but patent. Mild stenosis left M1 segment. Moderate stenosis distal left M1. Left middle cerebral artery is patent with scattered atherosclerotic disease. Both anterior cerebral arteries patent Posterior circulation: Mild stenosis distal right vertebral artery which supplies the basilar. Left vertebral artery ends in PICA. PICA patent bilaterally. Basilar widely patent. Superior cerebellar arteries patent bilaterally. Fetal origin right posterior cerebral artery. Occlusion distal right posterior cerebral artery. Diffuse atherosclerotic disease left posterior cerebral artery. Venous sinuses: Patent Anatomic variants: None Delayed phase: Not perform Review of the MIP images confirms the above findings CT Brain Perfusion Findings: CBF (<30%) Volume: 28mL Perfusion (Tmax>6.0s) volume: 31mL Mismatch Volume: 3mL Infarction Location:Right MCA territory involving the right temporal lobe and right parietal lobe. CT perfusion volume is smaller than that seen on the recent head CT. This may be due to subacute infarct and  pseudo normalization of the CT perfusion. Overall, the CT scan is felt to be more accurate representation of the large territory right MCA infarct. IMPRESSION: Heavily calcified carotid bifurcation bilaterally with mild stenosis bilaterally. Heavily calcified moderate stenosis of the cavernous carotid bilaterally. Moderate stenosis proximal right M1 segment and distal right middle cerebral artery. Right middle cerebral arteries are diseased but patent. Mild left middle cerebral artery stenosis proximally and moderate stenosis distal left middle cerebral artery. Diffuse intracranial atherosclerotic disease with occlusion of the right posterior cerebral artery and diffuse disease in the left posterior cerebral artery. CT perfusion compatible with subacute infarct right MCA territory. CT perfusion under represents the size of the infarct based on CT Head compatible with subacute infarct and pseudo normalization. These results were reviewed in person at the time of interpretation on 07/23/2017 at 2:21 pm to Dr. Delia Heady , who verbally acknowledged these results. Electronically Signed   By: Marlan Palau M.D.   On: 07/23/2017 14:23   Dg Chest Port 1 View  Result Date: 07/23/2017 CLINICAL DATA:  Slurred speech. EXAM: PORTABLE CHEST 1 VIEW COMPARISON:  04/04/2013 FINDINGS: 1616 hours. Low lung volumes with lordotic positioning. The cardio pericardial silhouette is enlarged. No overt pulmonary edema. Interstitial markings are diffusely coarsened with chronic features. No focal lung consolidation or substantial pleural effusion. The visualized bony structures of the thorax are intact. Telemetry leads overlie the chest. IMPRESSION: Cardiomegaly without acute cardiopulmonary findings. Electronically Signed   By: Kennith Center M.D.   On: 07/23/2017 16:32   Dg Finger Middle Right  Result Date: 07/30/2017 CLINICAL DATA:  Right middle finger pain. History of diabetes and gout. EXAM: RIGHT MIDDLE FINGER 2+V COMPARISON:   No recent prior. FINDINGS: Soft tissue swelling noted. Diffuse osteopenia and degenerative change. Degenerative changes most prominent about the proximal interphalangeal joint. Subtle fracture at the base of the middle phalanx of the right third digit cannot be excluded. Small exostosis noted of the proximal phalanx of the right  second digit. IMPRESSION: Soft tissue swelling. Diffuse osteopenia and degenerative change. Degenerative changes most prominent about the proximal interphalangeal joint. Subtle fracture of the base of the middle phalanx of the right third digit cannot be completely excluded. Electronically Signed   By: Maisie Fus  Register   On: 07/30/2017 10:21   Dg Foot 2 Views Right  Result Date: 08/02/2017 CLINICAL DATA:  Great toe pain. EXAM: RIGHT FOOT - 2 VIEW COMPARISON:  None. FINDINGS: No acute fracture or dislocation. Severe osteopenia. 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 No other soft tissue abnormality. IMPRESSION: No acute osseous injury of the right foot. Electronically Signed   By: Elige Ko   On: 08/02/2017 12:47   Ct Head Code Stroke Wo Contrast  Result Date: 07/23/2017 CLINICAL DATA:  Code stroke. 77 y/o M; fall yesterday, unable to walk with new onset today. EXAM: CT HEAD WITHOUT CONTRAST TECHNIQUE: Contiguous axial images were obtained from the base of the skull through the vertex without intravenous contrast. COMPARISON:  05/13/2013 CT head. FINDINGS: Brain: Large right MCA distribution late acute to subacute infarction involving the anterolateral temporal lobe, caudate head, putamen, and insula. Edema and local mass effect with partial effacement of the right lateral ventricle. Minimal right-sided uncal herniation. No significant midline shift. No hemorrhage. No additional area of infarction identified. Stable background of chronic microvascular ischemic changes and parenchymal volume loss of the brain. Vascular: Increased  density of right distal M1 and proximal M2 branches may represent thrombus. Calcific atherosclerosis of carotid siphons. Skull: Normal. Negative for fracture or focal lesion. Sinuses/Orbits: No acute finding. Other: Right-sided glaucoma device. ASPECTS [facility] Stroke Program Early CT Score) - Ganglionic level infarction (caudate, lentiform nuclei, internal capsule, insula, M1-M3 cortex): 1 - Supraganglionic infarction (M4-M6 cortex): 3 Total score (0-10 with 10 being normal): 4 IMPRESSION: 1. Right inferior MCA distribution late acute to subacute infarction involving temporal lobe, insula, and basal ganglia. Mass effect with partial effacement of right lateral ventricle and minimal right uncal herniation. No significant midline shift. No appreciable hemorrhage. 2. Density of right distal M1 and proximal M2 branches may represent thrombus. 3. ASPECTS is 4 These results were communicated to Dr. Pearlean Brownie at 1:46 pmon 2/21/2019by text page via the [facility] messaging system. Electronically Signed   By: Mitzi Hansen M.D.   On: 07/23/2017 13:47    Labs:  CBC: Recent Labs    07/25/17 0314 07/29/17 0922 08/04/17 1100 08/11/17 0551  WBC 8.2 9.9 9.8 9.1  HGB 12.5* 12.3* 11.9* 11.8*  HCT 37.8* 37.1* 35.6* 36.5*  PLT 222 207 336 435*    COAGS: Recent Labs    07/23/17 1325  INR 1.03  APTT 34    BMP: Recent Labs    08/08/17 0534 08/09/17 0714 08/10/17 0700 08/11/17 0551  NA 135 137 138 138  K 4.0 3.9 3.8 4.2  CL 105 108 110 109  CO2 18* 20* 20* 19*  GLUCOSE 106* 102* 100* 102*  BUN 39* 37* 32* 30*  CALCIUM 9.1 9.1 9.2 9.3  CREATININE 1.17 1.21 1.09 1.19  GFRNONAA 59* 56* >60 58*  GFRAA >60 >60 >60 >60    LIVER FUNCTION TESTS: Recent Labs    06/29/17 1612 07/23/17 1325 07/29/17 0922  BILITOT 0.6 0.7 0.5  AST 25 20 21   ALT 11* 15* 13*  ALKPHOS 57 50 51  PROT 8.5* 7.7 6.9  ALBUMIN 4.5 3.9 2.7*    TUMOR MARKERS: No results for input(s): AFPTM, CEA, CA199, CHROMGRNA in  the last 8760 hours.  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 Plavix cannot be held per Neurology recs. Risks and benefits discussed with the patient including, but not limited to the need for a barium enema during the procedure, infection, peritonitis, or damage to adjacent structures. Elevated risk of bleeding due to antiplatelet therapy.  All questions were answered, patient is agreeable to proceed. Consent obtained from the wife and in chart.    Thank you for this interesting consult.  I greatly enjoyed meeting [employer]. and look forward to participating in their care.  A copy of this report was sent to the requesting provider on this date.  Electronically Signed: Brayton El, PA-C 08/12/2017, 2:42 PM   I spent a total of 20 minutes in face to face in clinical consultation, greater than 50% of which was counseling/coordinating care for perc G-tube placement

## 2017-08-12 NOTE — Discharge Instructions (Signed)
Inpatient Rehab Discharge Instructions  Bobby RumpfJames Gherardi Jr. Discharge date and time:    Activities/Precautions/ Functional Status: Activity: activity as tolerated Diet:  Wound Care: keep wound clean and dry   Functional status:  ___ No restrictions     ___ Walk up steps independently ___ 24/7 supervision/assistance   ___ Walk up steps with assistance ___ Intermittent supervision/assistance  ___ Bathe/dress independently ___ Walk with walker     ___ Bathe/dress with assistance ___ Walk Independently    ___ Shower independently ___ Walk with assistance    ___ Shower with assistance ___ No alcohol     ___ Return to work/school ________  Special Instructions:    My questions have been answered and I understand these instructions. I will adhere to these goals and the provided educational materials after my discharge from the hospital.  Patient/Caregiver Signature _______________________________ Date __________  Clinician Signature _______________________________________ Date __________  Please bring this form and your medication list with you to all your follow-up doctor's appointments.

## 2017-08-13 ENCOUNTER — Inpatient Hospital Stay (HOSPITAL_COMMUNITY): Payer: Medicare Other | Admitting: Occupational Therapy

## 2017-08-13 ENCOUNTER — Inpatient Hospital Stay (HOSPITAL_COMMUNITY): Payer: Medicare Other | Admitting: Physical Therapy

## 2017-08-13 ENCOUNTER — Inpatient Hospital Stay (HOSPITAL_COMMUNITY): Payer: Medicare Other

## 2017-08-13 ENCOUNTER — Encounter (HOSPITAL_COMMUNITY): Payer: Self-pay | Admitting: Interventional Radiology

## 2017-08-13 ENCOUNTER — Ambulatory Visit (HOSPITAL_COMMUNITY): Payer: Medicare Other | Admitting: Speech Pathology

## 2017-08-13 HISTORY — PX: IR GASTROSTOMY TUBE MOD SED: IMG625

## 2017-08-13 LAB — BASIC METABOLIC PANEL
Anion gap: 9 (ref 5–15)
BUN: 31 mg/dL — ABNORMAL HIGH (ref 6–20)
CALCIUM: 9.3 mg/dL (ref 8.9–10.3)
CHLORIDE: 109 mmol/L (ref 101–111)
CO2: 20 mmol/L — AB (ref 22–32)
CREATININE: 1.08 mg/dL (ref 0.61–1.24)
GFR calc Af Amer: >60 mL/min (ref >60)
GFR calc non Af Amer: >60 mL/min (ref >60)
GLUCOSE: 108 mg/dL — AB (ref 65–99)
Potassium: 3.9 mmol/L (ref 3.5–5.1)
Sodium: 138 mmol/L (ref 135–145)

## 2017-08-13 LAB — GLUCOSE, CAPILLARY
GLUCOSE-CAPILLARY: 102 mg/dL — AB (ref 65–99)
GLUCOSE-CAPILLARY: 87 mg/dL (ref 65–99)
Glucose-Capillary: 85 mg/dL (ref 65–99)
Glucose-Capillary: 89 mg/dL (ref 65–99)

## 2017-08-13 LAB — PROTIME-INR
INR: 1.15
Prothrombin Time: 14.6 seconds (ref 11.4–15.2)

## 2017-08-13 MED ORDER — FENTANYL CITRATE (PF) 100 MCG/2ML IJ SOLN
INTRAMUSCULAR | Status: AC
  Filled 2017-08-13: qty 4

## 2017-08-13 MED ORDER — MIDAZOLAM HCL 2 MG/2ML IJ SOLN
INTRAMUSCULAR | Status: AC | PRN
  Administered 2017-08-13: 1 mg via INTRAVENOUS

## 2017-08-13 MED ORDER — CEFAZOLIN SODIUM-DEXTROSE 2-4 GM/100ML-% IV SOLN
INTRAVENOUS | Status: AC
  Filled 2017-08-13: qty 100

## 2017-08-13 MED ORDER — IOPAMIDOL (ISOVUE-300) INJECTION 61%
INTRAVENOUS | Status: AC
  Administered 2017-08-13: 10 mL
  Filled 2017-08-13: qty 50

## 2017-08-13 MED ORDER — LIDOCAINE HCL 1 % IJ SOLN
INTRAMUSCULAR | Status: AC
  Filled 2017-08-13: qty 20

## 2017-08-13 MED ORDER — LIDOCAINE HCL 1 % IJ SOLN
INTRAMUSCULAR | Status: AC | PRN
  Administered 2017-08-13: 6 mL

## 2017-08-13 MED ORDER — MIDAZOLAM HCL 2 MG/2ML IJ SOLN
INTRAMUSCULAR | Status: AC
  Filled 2017-08-13: qty 4

## 2017-08-13 MED ORDER — BACITRACIN-NEOMYCIN-POLYMYXIN 400-5-5000 EX OINT
1.0000 "application " | TOPICAL_OINTMENT | Freq: Every day | CUTANEOUS | Status: AC
  Administered 2017-08-13 – 2017-08-19 (×7): 1 via TOPICAL
  Filled 2017-08-13 (×7): qty 1

## 2017-08-13 MED ORDER — GLUCAGON HCL RDNA (DIAGNOSTIC) 1 MG IJ SOLR
INTRAMUSCULAR | Status: AC
  Filled 2017-08-13: qty 1

## 2017-08-13 MED ORDER — METHYLPHENIDATE HCL 5 MG PO TABS
10.0000 mg | ORAL_TABLET | Freq: Two times a day (BID) | ORAL | Status: DC
  Administered 2017-08-14 – 2017-08-21 (×15): 10 mg via ORAL
  Filled 2017-08-13 (×15): qty 2

## 2017-08-13 MED ORDER — FENTANYL CITRATE (PF) 100 MCG/2ML IJ SOLN
INTRAMUSCULAR | Status: AC | PRN
  Administered 2017-08-13: 50 ug via INTRAVENOUS

## 2017-08-13 NOTE — Progress Notes (Signed)
Physical Therapy Session Note  Patient Details  Name: Noah RumpfJames Marchitto Jr. MRN: [id number] Date of Birth: 10/02/1940  Today's Date: 08/13/2017 PT Individual Time: 1330-1350 PT Individual Time Calculation (min): 20 min   Short Term Goals: Week 3:  PT Short Term Goal 1 (Week 3): Pt will perform rolling maxA +1 PT Short Term Goal 2 (Week 3): Pt will perform supine <>sit maxA +1 PT Short Term Goal 3 (Week 3): Pt will perform lateral scoot transfer maxA +2 PT Short Term Goal 4 (Week 3): Pt will perform dynamic sitting balance with min guard x10 min while engaged in functional   Skilled Therapeutic Interventions/Progress Updates:    Pt seated in TIS w/c in room, agreeable to participate in therapy session and reports urge to urinate. Leaning forwards and left/right in w/c with min assist while maxi sling placed. Maximove transfer w/c to bed with assist x 2 to sitting EOB due to LLE knee ROM limitations. Sit to supine total assist x 2 for trunk control and LE management. Rolling L/R with total assist x 2 and use of bedrails to remove sling and clothing. Pt has had incontinence of urine. Dependent assist to change brief and for pericare. Pt left in care of RN and NT to finish dressing pt as transport is here to take pt down for PEG tube placement. Pt missed 10 min of scheduled skilled therapy session due to being taken for procedure.  Therapy Documentation Precautions:  Precautions Precautions: Fall Precaution Comments: severe left neglect Restrictions Weight Bearing Restrictions: No General: PT Amount of Missed Time (min): 10 Minutes PT Missed Treatment Reason: Other (Comment)(PEG tub placement)  See Function Navigator for Current Functional Status.   Therapy/Group: Individual Therapy  Peter Congoaylor Lonzell Dorris, PT, DPT  08/13/2017, 3:13 PM

## 2017-08-13 NOTE — Progress Notes (Signed)
Physical Therapy Weekly Progress Note  Patient Details  Name: Noah Bush. MRN: [id number] Date of Birth: [birth year]  Beginning of progress report period: August 05, 2017 End of progress report period: August 13, 2017  Today's Date: 08/13/2017 PT Individual Time: 1100-1155 PT Individual Time Calculation (min): 55 min   Patient has met 0 of 4 short term goals. Pt currently requires totalA +2 for bed mobility and lateral scoot transfers with transfer board. Performs sit <>stand with max/totalA +2 with "three muskateers" assist, however difficulty reaching full stand and perseverative on sitting back down. Pt has demonstrated ambulation in sara plus x50-60' however has been limited in past several days due to decreasing initiation and attention. Patient also limited by severe L inattention and R gaze preference.    Patient continues to demonstrate the following deficits muscle weakness and muscle joint tightness, decreased cardiorespiratoy endurance, impaired timing and sequencing, abnormal tone, unbalanced muscle activation, motor apraxia, ataxia, decreased coordination and decreased motor planning, decreased visual acuity, decreased visual perceptual skills and decreased visual motor skills, decreased midline orientation, decreased attention to left, left side neglect, decreased motor planning and ideational apraxia, decreased initiation, decreased attention, decreased awareness, decreased problem solving, decreased safety awareness, decreased memory and delayed processing and decreased sitting balance, decreased standing balance, decreased postural control, hemiplegia and decreased balance strategies and therefore will continue to benefit from skilled PT intervention to increase functional independence with mobility.  Patient not progressing toward long term goals.  See goal revision..  Plan of care revisions: goals downgraded to maxA overall.  PT Short Term Goals Week 2:  PT Short Term Goal 1  (Week 2): Pt will perform rolling R/L modA using bedrails PT Short Term Goal 1 - Progress (Week 2): Not met PT Short Term Goal 2 (Week 2): Pt will transfer w/c <>bed using LRAD and modA +2 PT Short Term Goal 2 - Progress (Week 2): Not met PT Short Term Goal 3 (Week 2): Pt will perform sit <>stand maxA +1 PT Short Term Goal 3 - Progress (Week 2): Not met PT Short Term Goal 4 (Week 2): Pt will initiate w/c propulsion  PT Short Term Goal 4 - Progress (Week 2): Not met Week 3:  PT Short Term Goal 1 (Week 3): Pt will perform rolling maxA +1 PT Short Term Goal 2 (Week 3): Pt will perform supine <>sit maxA +1 PT Short Term Goal 3 (Week 3): Pt will perform lateral scoot transfer maxA +2 PT Short Term Goal 4 (Week 3): Pt will perform dynamic sitting balance with min guard x10 min while engaged in functional   Skilled Therapeutic Interventions/Progress Updates: Pt received seated in w/c at nurses station, denies pain but perseverative on being cold throughout session. Lateral scoot transfer w/c <>mat table totalA +2. Sitting balance on mat table with standyA however demonstrates significant L trunk shortening/L trunk lengthening with L cervical lateral flexion/R rotation. Provided tactile cueing and NDT techniques for correcting postural alignment. Utilized tactile cues for head alignment to utilize body-on-head righting reactions. Pt able to correctly identify color of cups held at midline with 75% accuracy and significantly increased time and mod cues for scanning to locate item. Semi-reclined sit ups against stability ball x8 reps; increased time and cueing required to engage in activity. During rest break, pt does correctly report that his physician came to talk to him about "getting a tube put in me because they say I'm not eating enough, but I think I'm eating plenty". Reinforced physicians discussion and  educated pt on role of nutrition for facilitating healing and recovery from stroke. Sit <>supine totalA  +2. Supine trunk rotation with BLE on stability ball to reduce rigidity and tone. PROM to cervical spine L rotation to counter effects of L inattention and prolonged positioning in R rotation. Returned to w/c as above; remained reclined in w/c at nurses station at end of session, quick release belt intact.      Therapy Documentation Precautions:  Precautions Precautions: Fall Precaution Comments: severe left neglect Restrictions Weight Bearing Restrictions: No Pain: Pain Assessment Pain Assessment: 0-10 Pain Score: 0-No pain   See Function Navigator for Current Functional Status.  Therapy/Group: Individual Therapy  Luberta Mutter 08/13/2017, 11:57 AM

## 2017-08-13 NOTE — Sedation Documentation (Signed)
Pt SR with frequent PVC's occasional bigeminy 

## 2017-08-13 NOTE — Sedation Documentation (Signed)
Pt SR with frequent PVC's occasional bigeminy

## 2017-08-13 NOTE — Progress Notes (Signed)
[facility] [facility] PHYSICAL MEDICINE & REHABILITATION     PROGRESS NOTE  Subjective/Complaints:  RN reports no problems overnight. Resting comfortably this morning. Denies any pain  ROS: Limited due to cognitive/behavioral   Objective: Vital Signs: Blood pressure 139/70, pulse 67, temperature 98.4 F (36.9 C), temperature source Oral, resp. rate 18, height 5\' 11"  (1.803 m), weight 122.4 kg (269 lb 13.5 oz), SpO2 99 %. Ct Head Wo Contrast  Result Date: 08/12/2017 CLINICAL DATA:  Altered level of consciousness. Recent large right MCA territory infarct. EXAM: CT HEAD WITHOUT CONTRAST TECHNIQUE: Contiguous axial images were obtained from the base of the skull through the vertex without intravenous contrast. COMPARISON:  MRI brain 07/24/2017 FINDINGS: Brain: Expected evolution of the large right MCA territory infarct is evident. There is now volume loss. Previously-seen midline shift has resolved. Partial effacement of the right lateral ventricle has partially resolved. Cortical hyperdensity reflects petechial hemorrhages previously noted. Some of this hyperdensity may represent cortical laminar necrosis. No new infarct or hemorrhage is present. There are still some mass effect on the atrium of the right lateral ventricle. No significant extra-axial fluid collection is present. Vascular: Atherosclerotic calcifications are present in the cavernous internal carotid arteries bilaterally. There is no hyperdense vessel. Skull: Calvarium is intact. No focal lytic or blastic lesions are present. Sinuses/Orbits: Maxillary sinus wall thickening is present bilaterally without significant coastal disease. The paranasal sinuses are otherwise clear. The mastoid air cells are clear. Scleral banding is noted at the right globe. Globes and orbits are otherwise normal. IMPRESSION: 1. Expected evolution of large right MCA territory infarct. 2. Stable areas of hyperdensity compatible with petechial hemorrhage. 3. No new hemorrhage or  significant expansion of the infarct. 4. Decreasing mass effect. Electronically Signed   By: Marin Roberts M.D.   On: 08/12/2017 10:29   Recent Labs    08/11/17 0551  WBC 9.1  HGB 11.8*  HCT 36.5*  PLT 435*   Recent Labs    08/11/17 0551 08/13/17 0540  NA 138 138  K 4.2 3.9  CL 109 109  GLUCOSE 102* 108*  BUN 30* 31*  CREATININE 1.19 1.08  CALCIUM 9.3 9.3   CBG (last 3)  Recent Labs    08/12/17 1659 08/12/17 2046 08/13/17 0624  GLUCAP 94 173* 85    Wt Readings from Last 3 Encounters:  08/12/17 122.4 kg (269 lb 13.5 oz)  07/23/17 124.3 kg (274 lb)  06/29/17 124.3 kg (274 lb)    Physical Exam:  BP 139/70 (BP Location: Right Arm)   Pulse 67   Temp 98.4 F (36.9 C) (Oral)   Resp 18   Ht 5\' 11"  (1.803 m)   Wt 122.4 kg (269 lb 13.5 oz)   SpO2 99%   BMI 37.64 kg/m  Constitutional: No distress . Vital signs reviewed. HEENT: EOMI, oral membranes moist Cardiovascular: RRR without murmur. No JVD    Respiratory: CTA Bilaterally without wheezes or rales. Normal effort    GI: BS +, non-tender, non-distended   Musculoskeletal: Right 3rd/4th fingers splinted, k-tape--hand generally tender, no abnl swelling---stable Neurological:  Limited by apraxia and ability to follow commands Arouses easily this morning Left facial weakness with ongoing dysarthria Able to follow simple motor commands with max cues.  Motor: RUE 3+/5 proximal to distal with limitations at left hand RLE 3-/5 proximal to distal  LUE: 2+ to 3/5 proximal to distal LLE: 1/5 proximal to distal---stable Skin: Skin is dry,warm   Psychiatric: flat   Assessment/Plan: 1. 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 Physiatrist is providing close team supervision and 24 hour management of active medical problems listed below. Physiatrist  and rehab team continue to assess barriers to discharge/monitor patient progress toward functional and medical goals.  Function:  Bathing Bathing position   Position: Bed  Bathing parts Body parts bathed by patient: Abdomen, Chest Body parts bathed by helper: Right arm, Left arm, Front perineal area, Buttocks, Right upper leg, Left upper leg, Back, Left lower leg, Right lower leg, Chest, Abdomen  Bathing assist Assist Level: 2 helpers      Upper Body Dressing/Undressing Upper body dressing   What is the patient wearing?: Pull over shirt/dress       Pull over shirt/dress - Perfomed by helper: Thread/unthread right sleeve, Thread/unthread left sleeve, Put head through opening, Pull shirt over trunk        Upper body assist Assist Level: 2 helpers      Lower Body Dressing/Undressing Lower body dressing   What is the patient wearing?: Hospital Gown       Pants- Performed by helper: Thread/unthread right pants leg, Thread/unthread left pants leg, Pull pants up/down   Non-skid slipper socks- Performed by helper: Don/doff right sock, Don/doff left sock                  Lower body assist Assist for lower body dressing: 2 Helpers      Toileting Toileting Toileting activity did not occur: No continent bowel/bladder event   Toileting steps completed by helper: Adjust clothing prior to toileting, Performs perineal hygiene, Adjust clothing after toileting    Toileting assist Assist level: Two helpers   Transfers Chair/bed transfer   Chair/bed transfer method: Lateral scoot Chair/bed transfer assist level: 2 helpers Chair/bed transfer assistive device: Sliding board, Armrests Mechanical lift: Maximove   Locomotion Ambulation Ambulation activity did not occur: Safety/medical concerns   Max distance: 10 Assist level: 2 helpers   Wheelchair   Type: Manual   Assist Level: Dependent (Pt equals 0%)(dependently transported to gym)  Cognition Comprehension  Comprehension assist level: Understands basic 25 - 49% of the time/ requires cueing 50 - 75% of the time  Expression Expression assist level: Expresses basic 50 - 74% of the time/requires cueing 25 - 49% of the time. Needs to repeat parts of sentences.  Social Interaction Social Interaction assist level: Interacts appropriately 25 - 49% of time - Needs frequent redirection.  Problem Solving Problem solving assist level: Solves basic less than 25% of the time - needs direction nearly all the time or does not effectively solve problems and may need a restraint for safety  Memory Memory assist level: Recognizes or recalls less than 25% of the time/requires cueing greater than 75% of the time    Medical Problem List and Plan: 1.  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   Cont CIR--will likely need SNF   -Head CT personally reviewed and shows expected evolution of infarct 2.  DVT Prophylaxis/Anticoagulation: start  Pharmaceutical: Lovenox 3. Pain Management: tylenol prn. Added Sportscreme to help with joint stiffness bilateral hands and left knee.    -Right hand middle finger pain, base of fracture nondisplaced     = splint, Ortho follow-up after hospitalization--- ongoing discomfort in right hand   -Right  first MTP pain likely gout. xrays negative     4. Mood: Team to provide encouragement/ego support. LCSW to follow for evaluation when appropriate.  5. Alzheimer's dementia/ Neuropsych: This patient is not capable of making decisions on his own behalf. Continue Namenda    -continue methylphenidate 5mg  bid to see if we can improve initiation and arousal--no changes thus far.    -titration to 10mg  bid 6. Skin/Wound Care: Pressure relief measures. Maintain adequate nutritional and hydration status.  7. Fluids/Electrolytes/Nutrition: poor intake, a lot of that is  cognitive. Encourage to eat/supervision.   -for PEG today, appreciate INR help    Continue at IVF low rate-at HS. Change to TF flushes once PEG usable   prealbumin low   Serial labs    Will dc megace---as it's not had an effect, encouraging PO as possible     8. T2DM: Monitor BS ac/hs. Continue SSI.    -controlled 3/13 CBG (last 3)  Recent Labs    08/12/17 1659 08/12/17 2046 08/13/17 0624  GLUCAP 94 173* 85   9. HTN: Monitor BP bid-       Vitals:   08/12/17 2052 08/13/17 0527  BP: (!) 147/70 139/70  Pulse: 72 67  Resp:    Temp:  98.4 F (36.9 C)  SpO2: 95% 99%    Metoprolol XL to 25 mg daily on 3/6 decreased to 12.5 on 3/10 to avoid hypoperfusion----some elevation but no change indicated today 10. CKD stage III:  .    Cr  1.19     BUN continues to trend downward   Will need to continue IVF until PEG placed 11. Dyslipidemia: now on Lipitor.   12. Morbid obesity             Body mass index is 38.22 kg/m.             Diet and exercise education 13. Acute blood loss anemia   Hemoglobin 11.9  3/5   -Hemoglobin 11.8 14.  Bowel incontinence this is likely related to his cognitive status, nursing working on achieving continence 15. Bladder: consider removing condom cath although it will likely lead to more incontinence.    16. Cough: Improved   -Continue Robitussin scheduled--- changed to as needed   -Aspiration precautions     LOS (Days) 16 A FACE TO FACE EVALUATION WAS PERFORMED  Sharaine Delange T 08/13/2017 7:43 AM

## 2017-08-13 NOTE — Plan of Care (Signed)
  Progressing Consults RH STROKE PATIENT EDUCATION Description See Patient Education module for education specifics  08/13/2017 1349 - Progressing by Ronnette JuniperPerkins, Tulip Meharg E, LPN Nutrition Consult-if indicated 08/13/2017 1349 - Progressing by Ronnette JuniperPerkins, Elven Laboy E, LPN Diabetes Guidelines if Diabetic/Glucose > 140 Description If diabetic or lab glucose is > 140 mg/dl - Initiate Diabetes/Hyperglycemia Guidelines & Document Interventions  08/13/2017 1349 - Progressing by Ronnette JuniperPerkins, Ethelene Closser E, LPN RH BOWEL ELIMINATION RH STG MANAGE BOWEL WITH ASSISTANCE Description STG Manage Bowel with  max Assistance.   08/13/2017 1349 - Progressing by Ronnette JuniperPerkins, Abilene Mcphee E, LPN RH STG MANAGE BOWEL W/MEDICATION W/ASSISTANCE Description STG Manage Bowel with Medication with max assist  08/13/2017 1349 - Progressing by Ronnette JuniperPerkins, Ottie Neglia E, LPN RH BLADDER ELIMINATION RH STG MANAGE BLADDER WITH ASSISTANCE Description STG Manage Bladder With max assist  08/13/2017 1349 - Progressing by Ronnette JuniperPerkins, Gem Conkle E, LPN RH SKIN INTEGRITY RH STG SKIN FREE OF INFECTION/BREAKDOWN Description While on rehab with max.  08/13/2017 1349 - Progressing by Ronnette JuniperPerkins, Chyenne Sobczak E, LPN RH STG MAINTAIN SKIN INTEGRITY WITH ASSISTANCE Description STG Maintain Skin Integrity With max Assistance.  08/13/2017 1349 - Progressing by Ronnette JuniperPerkins, Althea Backs E, LPN RH SAFETY RH STG ADHERE TO SAFETY PRECAUTIONS W/ASSISTANCE/DEVICE Description STG Adhere to Safety Precautions With mod  Assistance/Device.  08/13/2017 1349 - Progressing by Ronnette JuniperPerkins, Twinkle Sockwell E, LPN RH STG DECREASED RISK OF FALL WITH ASSISTANCE Description STG Decreased Risk of Fall With  Mod Assistance.  08/13/2017 1349 - Progressing by Ronnette JuniperPerkins, Katryn Plummer E, LPN RH COGNITION-NURSING RH STG USES MEMORY AIDS/STRATEGIES W/ASSIST TO PROBLEM SOLVE Description STG Uses Memory Aids/Strategies With  Mod Assistance to Problem Solve.  08/13/2017 1349 - Progressing by Ronnette JuniperPerkins, Mayre Bury E, LPN RH KNOWLEDGE  DEFICIT RH STG INCREASE KNOWLEDGE OF DYSPHAGIA/FLUID INTAKE 08/13/2017 1349 - Progressing by Ronnette JuniperPerkins, Tracy Gerken E, LPN

## 2017-08-13 NOTE — Progress Notes (Signed)
Pt went downstairs for peg placement via bed.

## 2017-08-13 NOTE — Progress Notes (Signed)
Nutrition Follow-up  DOCUMENTATION CODES:   Obesity unspecified  INTERVENTION:   Initiate Osmolite 1.2 @ 40 ml/hr increase by 10 ml every 6 hours to goal rate 75 ml/hr.  Provides: 2160 kcals, 100 grams protein, 1476 ml free water. This meets 100% of calorie and protein needs.   NUTRITION DIAGNOSIS:   Inadequate oral intake related to lethargy/confusion as evidenced by meal completion < 25%.   Ongoing  GOAL:   Patient will meet greater than or equal to 90% of their needs  Not Meeting  MONITOR:   PO intake, Supplement acceptance, Labs, Weight trends, I & O's, Skin, Diet advancement  REASON FOR ASSESSMENT:   Consult Calorie Count  ASSESSMENT:   77 y.o. RH- male  with history of HTN, Alzheimer dementia, CKD III, and T2DM who was admitted on 07/23/17 from MD office with reports of fall, difficulty walking, drooling, right gaze preference and facial droop. 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 Cognitive evaluation revealed severe cognitive communication impairment question baseline but able to follow basic commands with left visual neglect. He continues to have issues with lethargy requiring tactile cues for activity, has significant left oral motor weakness--started on dysphagia 1, thin liquids and severe left neglect with pusher tendency.     3/14- PEG tube placed, remains on LSW until tomorrow  Radiology states diet may be advanced as tolerated and the tube can be used tomorrow. Pt's intake remains poor. He has not consumed much of meals, and drinks supplements off and on. MD noted megace has been discontinued as it's not had an effect on PO intake. Will provide tube feeding rate to meet all of needs. If PO intake increases, may consider decreasing rate. Weight noted to decrease by 2 lb since last RD visit 3/12 (271 lb to 269 lb).    Medications reviewed and include: SSI, Rena-Vit, NS @ ml/hr Labs reviewed.   Diet Order:  Diet NPO time specified  EDUCATION NEEDS:   Not appropriate for education at this time  Skin:  Skin Assessment: Skin Integrity Issues: Skin Integrity Issues:: Other (Comment) Other: non pressure wound to buttocks  Last BM:  08/13/17  Height:   Ht Readings from Last 1 Encounters:  07/28/17 5\' 11"  (1.803 m)    Weight:   Wt Readings from Last 1 Encounters:  08/12/17 269 lb 13.5 oz (122.4 kg)    Ideal Body Weight:  78 kg  BMI:  Body mass index is 37.64 kg/m.  Estimated Nutritional Needs:   Kcal:  2050-2250  Protein:  100-115 grams  Fluid:  2- 2.2 L/day   Vanessa Kickarly Fabiano Ginley RD, LDN Clinical Nutrition Pager # - [phone]

## 2017-08-13 NOTE — Progress Notes (Signed)
Occupational Therapy Session Note  Patient Details  Name: Noah Bush. MRN: [id number] Date of Birth: 10-21-1940  Today's Date: 08/13/2017 OT Individual Time: 1000-1059 OT Individual Time Calculation (min): 59 min   Short Term Goals: Week 3:  OT Short Term Goal 1 (Week 3): Pt will roll to the R in bed with max A. OT Short Term Goal 2 (Week 3): Pt will roll to the L in bed with max A. OT Short Term Goal 3 (Week 3): Pt will demonstrate improved use of UE by washing face and chest with washcloth with mod VC. OT Short Term Goal 4 (Week 3): Pt will be able to sit upright at EOB with mod A.   Skilled Therapeutic Interventions/Progress Updates:    OT treatment session focused on bed mobility, L attention, sequencing, and initiation. Pt incontinent of bladder upon OT arrival. Bed mobility total A +2 to roll L and R for total A peri-care and brief change. Total A to don shorts, then rolling used to pull up pants as pt is unable to bend L knee for hip bridging. Maximove transfer bed>TIS wc. Worked on L attention, initiation, and sequencing with donning shirt sitting in wc. Pt needed increased time and max multimodal cues to initiate 1 step of task, but still needed total A to complete thoroughly. Pt brought to dynavision room and worked on attention, initiation, and visual scanning to L. Lights turned off and environment non distracting. Pt initiated pushing 2 buttons throughout trials, with hand over hand A to reach full toward buttons. Manual facilitation at head/neck to turn head to L. Pt able to look to midline on 2/10 opportunities with max multimodal cues, unable to get pt to scan past midline to L effectively. Pt left tilted in wc at nurses station at end of session with safety belt on and needs met.   Therapy Documentation Precautions:  Precautions Precautions: Fall Precaution Comments: severe left neglect Restrictions Weight Bearing Restrictions: No Pain: Pain Assessment Pain Assessment:  0-10 Pain Score: 0-No pain ADL: ADL ADL Comments: see functional navigator  See Function Navigator for Current Functional Status.   Therapy/Group: Individual Therapy  Valma Cava 08/13/2017, 11:00 AM

## 2017-08-13 NOTE — Procedures (Signed)
Interventional Radiology Procedure Note  Procedure: Placement of percutaneous 20F pull-through gastrostomy tube. Complications: None Recommendations: - NPO except for sips and chips remainder of today and overnight - Maintain G-tube to LWS until tomorrow morning  - May advance diet as tolerated and begin using tube tomorrow morning  Signed,   Eimy Plaza S. Clytee Heinrich, DO   

## 2017-08-13 NOTE — Progress Notes (Signed)
Speech Language Pathology Daily Session Note  Patient Details  Name: Noah RumpfJames Roop Jr. MRN: [id number] Date of Birth: Sep 07, 1940  Today's Date: 08/13/2017 SLP Individual Time: 0800-0900 SLP Individual Time Calculation (min): 60 min  Short Term Goals: Week 3: SLP Short Term Goal 1 (Week 3): Pt will sustain his attention to basic, familiar tasks for 10 minute with max verbal cues for redirection.  SLP Short Term Goal 2 (Week 3): Pt will utilize external aids to reorient to place, date, and situation in 50% of opportunties with max assist verbal cues.  SLP Short Term Goal 3 (Week 3): Pt will consume dys 1 textures and thin liquids with min cues for use of swallowing precautions and minimal overt s/s of aspiration. SLP Short Term Goal 4 (Week 3): Pt will demonstrate functional problem solving during ADL tasks in 50% of opportunies with max A multimodal cues.  Skilled Therapeutic Interventions: Skilled treatment session focused on cognition goals. SLP facilitated session by providing Total A multimodal support to perform ADL task of wiping his face with washcloth. SLP placed washcloth in pt's hand but pt unable to visually track to find washcloth and asked "what's this?" Total A support given but pt refused to wash his face. When SLP presented herself within pt's visual field, pt with significant inability to track and make eye contact with pt. PO intake offered but pt refused. Pt able to recall that he is in hospital but his statement indicate possible hallucinations d/t visual deficits of people being in his room and heading to car wash. Pt left upright in bed with bed alarm on and all needs within reach. Continue per current plan of care.      Function:    Cognition Comprehension Comprehension assist level: Understands basic 25 - 49% of the time/ requires cueing 50 - 75% of the time  Expression   Expression assist level: Expresses basic 50 - 74% of the time/requires cueing 25 - 49% of the time.  Needs to repeat parts of sentences.  Social Interaction Social Interaction assist level: Interacts appropriately 25 - 49% of time - Needs frequent redirection.  Problem Solving Problem solving assist level: Solves basic less than 25% of the time - needs direction nearly all the time or does not effectively solve problems and may need a restraint for safety  Memory Memory assist level: Recognizes or recalls less than 25% of the time/requires cueing greater than 75% of the time    Pain Pain Assessment Pain Assessment: 0-10 Pain Score: 0-No pain  Therapy/Group: Individual Therapy  Brindy Higginbotham 08/13/2017, 11:34 AM

## 2017-08-14 ENCOUNTER — Inpatient Hospital Stay (HOSPITAL_COMMUNITY): Payer: Medicare Other | Admitting: Physical Therapy

## 2017-08-14 ENCOUNTER — Inpatient Hospital Stay (HOSPITAL_COMMUNITY): Payer: Medicare Other | Admitting: Occupational Therapy

## 2017-08-14 ENCOUNTER — Inpatient Hospital Stay (HOSPITAL_COMMUNITY): Payer: Medicare Other | Admitting: Speech Pathology

## 2017-08-14 LAB — GLUCOSE, CAPILLARY
GLUCOSE-CAPILLARY: 123 mg/dL — AB (ref 65–99)
GLUCOSE-CAPILLARY: 156 mg/dL — AB (ref 65–99)
Glucose-Capillary: 96 mg/dL (ref 65–99)
Glucose-Capillary: 150 mg/dL — ABNORMAL HIGH (ref 65–99)

## 2017-08-14 MED ORDER — PRO-STAT SUGAR FREE PO LIQD
30.0000 mL | Freq: Three times a day (TID) | ORAL | Status: DC
  Administered 2017-08-14 – 2017-08-24 (×33): 30 mL
  Filled 2017-08-14 (×31): qty 30

## 2017-08-14 MED ORDER — OSMOLITE 1.5 CAL PO LIQD
280.0000 mL | Freq: Three times a day (TID) | ORAL | Status: DC
  Administered 2017-08-14 – 2017-08-24 (×35): 280 mL
  Filled 2017-08-14 (×55): qty 474

## 2017-08-14 NOTE — Progress Notes (Signed)
Speech Language Pathology Daily Session Note  Patient Details  Name: Noah RumpfJames Dolinsky Jr. MRN: [id number] Date of Birth: [birth year]  Today's Date: 08/14/2017 SLP Individual Time: 0730-0800 SLP Individual Time Calculation (min): 30 min  Short Term Goals: Week 3: SLP Short Term Goal 1 (Week 3): Pt will sustain his attention to basic, familiar tasks for 10 minute with max verbal cues for redirection.  SLP Short Term Goal 2 (Week 3): Pt will utilize external aids to reorient to place, date, and situation in 50% of opportunties with max assist verbal cues.  SLP Short Term Goal 3 (Week 3): Pt will consume dys 1 textures and thin liquids with min cues for use of swallowing precautions and minimal overt s/s of aspiration. SLP Short Term Goal 4 (Week 3): Pt will demonstrate functional problem solving during ADL tasks in 50% of opportunies with max A multimodal cues.  Skilled Therapeutic Interventions: Skilled treatment session focused on cognition goals. SLP facilitated session by providing Total A for completion of holding warm wash rage in his hand. Even with hand over hand, pt unable to participate in task of washing his face. Pt was alert but no cognitive awareness of task. SLP further facilitated session by having pt visualize his glassed in my hand. Pt states that he already has them on. Despite Total A multimodal cues, pt does not initiate any movement or indication that he will put them on his face. Hand over hand was not effective. Pt left upright in bed with all needs within reach. Continue per current plan of care.      Function:    Cognition Comprehension Comprehension assist level: Understands basic 25 - 49% of the time/ requires cueing 50 - 75% of the time  Expression   Expression assist level: Expresses basic 25 - 49% of the time/requires cueing 50 - 75% of the time. Uses single words/gestures.  Social Interaction Social Interaction assist level: Interacts appropriately 25 - 49% of time -  Needs frequent redirection.  Problem Solving Problem solving assist level: Solves basic less than 25% of the time - needs direction nearly all the time or does not effectively solve problems and may need a restraint for safety  Memory Memory assist level: Recognizes or recalls 25 - 49% of the time/requires cueing 50 - 75% of the time    Pain Pain Assessment Pain Score: 0-No pain  Therapy/Group: Individual Therapy  Miyana Mordecai 08/14/2017, 12:14 PM

## 2017-08-14 NOTE — Progress Notes (Signed)
Occupational Therapy Session Note  Patient Details  Name: Noah RumpfJames Pinsky Jr. MRN: [id number] Date of Birth: 11-17-1940  Today's Date: 08/14/2017 OT Individual Time: 1416-1530 OT Individual Time Calculation (min): 74 min   Short Term Goals: Week 3:  OT Short Term Goal 1 (Week 3): Pt will roll to the R in bed with max A. OT Short Term Goal 2 (Week 3): Pt will roll to the L in bed with max A. OT Short Term Goal 3 (Week 3): Pt will demonstrate improved use of UE by washing face and chest with washcloth with mod VC. OT Short Term Goal 4 (Week 3): Pt will be able to sit upright at EOB with mod A.   Skilled Therapeutic Interventions/Progress Updates:    Pt greeted in w/c in room. Tx focus on initiation, visual scanning, attention, and awareness. Pt escorted to gym to work on sit<stands at elevated mat with pt refusing to stand/resisting anterior weight shifts with 2 helpers. Transitioned to having pt attend to favorite music. Pt unable to scan musician dancing in music video when screen was placed in Rt visual field. [facility] assist, tried to have pt tap/clap/squeeze therapist's hand in beat to music with consistency. He required total multimodal cues to maintain alertness/sustained attention to tasks and was externally distracted in moderately stimulating environment. Escorted him to dayroom and worked on [employer] motorcycles and trucks, moving objects from [employer] visual field towards midline. Pt unable to maintain alertness to task to participate due to somnolescence. For remainder of session, took him to hallway calendar and provided him orientation information that he was unable to recall within 1 minute time span. Pt keeping eyes close and did not look at calendar. He was then escorted to RN station and left with safety belt fastened.   Therapy Documentation Precautions:  Precautions Precautions: Fall Precaution Comments: severe left neglect Restrictions Weight Bearing Restrictions: No Vital  Signs: Therapy Vitals Temp: 98.9 F (37.2 C) Temp Source: Axillary Pulse Rate: 87 Resp: 20 BP: 109/69 Patient Position (if appropriate): Sitting Oxygen Therapy SpO2: 99 % O2 Device: Room Air ADL: ADL ADL Comments: see functional navigator     See Function Navigator for Current Functional Status.   Therapy/Group: Individual Therapy  Quintel Mccalla A Camauri Craton 08/14/2017, 4:14 PM

## 2017-08-14 NOTE — Plan of Care (Signed)
  RH BOWEL ELIMINATION RH STG MANAGE BOWEL WITH ASSISTANCE Description STG Manage Bowel with  max Assistance.   08/14/2017 1107 - Progressing by Pollyann SavoyLloyd, Kirstina Leinweber D, RN   RH BOWEL ELIMINATION RH STG MANAGE BOWEL W/MEDICATION W/ASSISTANCE Description STG Manage Bowel with Medication with max assist  08/14/2017 1107 - Progressing by Georgina PillionLloyd, Chanice Brenton D, RN  Administered bowel regimen with schedule meds RH SKIN INTEGRITY RH STG SKIN FREE OF INFECTION/BREAKDOWN Description While on rehab with max.  08/14/2017 1107 - Progressing by Pollyann SavoyLloyd, Aerionna Moravek D, RN  Administered dsg change as ordered RH COGNITION-NURSING RH STG USES MEMORY AIDS/STRATEGIES W/ASSIST TO PROBLEM SOLVE Description STG Uses Memory Aids/Strategies With  Mod Assistance to Problem Solve.  08/14/2017 1107 - Progressing by Pollyann SavoyLloyd, Henrietta Cieslewicz D, RN  Continue to assist with days, dates, place, and staffing names.  RH KNOWLEDGE DEFICIT RH STG INCREASE KNOWLEDGE OF DYSPHAGIA/FLUID INTAKE 08/14/2017 1107 - Progressing by Georgina PillionLloyd, Srishti Strnad D, RN  Deficit of knowledge resulted in dysphagiz/fluid intake assist patient with consumption and encouraging

## 2017-08-14 NOTE — Progress Notes (Signed)
Nutrition Follow-up  DOCUMENTATION CODES:   Obesity unspecified  INTERVENTION:  Let pt attempt to eat his food at meal, if po intake for that meal is <50%, provide a bolus feed via PEG using Osmolite 1.5 formula at volume of 280 ml up to three times daily.   Additionally provide bolus feed of 280 ml at HS daily.   Provide 30 ml Prostat TID per tube.   If all boluses are given, tube feeding to provide 1980 kcal (97% of kcal needs), 115 grams of protein (100% of protein needs), 851 ml of water.   Recommend addition of free water flushes once IV fluids are discontinued.  NUTRITION DIAGNOSIS:   Inadequate oral intake related to lethargy/confusion as evidenced by meal completion < 25%; ongoing  GOAL:   Patient will meet greater than or equal to 90% of their needs; not met  MONITOR:   PO intake, Supplement acceptance, Labs, Weight trends, I & O's, Skin, Diet advancement  REASON FOR ASSESSMENT:   Consult Calorie Count  ASSESSMENT:   77 y.o. RH- male  with history of HTN, Alzheimer dementia, CKD III, and T2DM who was admitted on 07/23/17 from MD office with reports of fall, difficulty walking, drooling, right gaze preference and facial droop. 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 Cognitive evaluation revealed severe cognitive communication impairment question baseline but able to follow basic commands with left visual neglect. He continues to have issues with lethargy requiring tactile cues for activity, has significant left oral motor weakness--started on dysphagia 1, thin liquids and severe left neglect with pusher tendency.    PEG placed yesterday. IR has cleared PEG for use this AM. RD to order tube feeding regimen. Pt is currently on a dysphagia 1 diet with thin liquids. Per Pam, PA, request, will let pt attempt to eat at meals, if po intake  for that meal is <50%, a bolus feed will be given via tube. RD to continue to monitor.   Labs and medications reviewed.   Diet Order:  DIET - DYS 1 Room service appropriate? Yes; Fluid consistency: Thin  EDUCATION NEEDS:   Not appropriate for education at this time  Skin:  Skin Assessment: Skin Integrity Issues: Skin Integrity Issues:: Other (Comment) Other: non pressure wound to buttocks  Last BM:  3/15  Height:   Ht Readings from Last 1 Encounters:  07/28/17 _0  (1.803 m)    Weight:   Wt Readings from Last 1 Encounters:  08/13/17 264 lb 12.4 oz (120.1 kg)    Ideal Body Weight:  78 kg  BMI:  Body mass index is 36.93 kg/m.  Estimated Nutritional Needs:   Kcal:  2050-2250  Protein:  100-115 grams  Fluid:  2- 2.2 L/day    Corrin Parker, MS, RD, LDN Pager # [phone] After hours/ weekend pager # [phone]

## 2017-08-14 NOTE — Progress Notes (Signed)
Referring Physician(s): Dr Jocelyn LamerZ Swartz  Supervising Physician: Irish LackYamagata, Glenn  Patient Status:  Noah Bush - In-pt  Chief Complaint:  CVA Dysphagia  Subjective:  Percutaneous gastric tube placement 3/14 in IR Lying in recliner Easily aroused No complaint  Allergies: Patient has no known allergies.  Medications: Prior to Admission medications   Medication Sig Start Date End Date Taking? Authorizing Provider  aspirin EC 81 MG tablet Take 81 mg by mouth daily.   Yes [provider]  atorvastatin (LIPITOR) 80 MG tablet Take 1 tablet (80 mg total) by mouth daily at 6 PM. 07/28/17  Yes Garnette Gunnerhompson, Aaron B, MD  clopidogrel (PLAVIX) 75 MG tablet Take 1 tablet (75 mg total) by mouth daily. 07/29/17  Yes Garnette Gunnerhompson, Aaron B, MD  latanoprost (XALATAN) 0.005 % ophthalmic solution Place 1 drop into the left eye at bedtime. 07/08/17  Yes [provider]  memantine (NAMENDA) 5 MG tablet Take 5 mg by mouth daily.   Yes [provider]  metoprolol succinate (TOPROL-XL) 25 MG 24 hr tablet Take 0.5 tablets (12.5 mg total) by mouth daily. 07/29/17  Yes Garnette Gunnerhompson, Aaron B, MD     Vital Signs: BP (!) 114/55 (BP Location: Right Arm)   Pulse 66   Temp 98.8 F (37.1 C) (Axillary)   Resp 20   Ht 5\' 11"  (1.803 m)   Wt 264 lb 12.4 oz (120.1 kg)   SpO2 100%   BMI 36.93 kg/m   Physical Exam  Abdominal: Soft. Bowel sounds are normal.  Skin: Skin is warm and dry.  Site is clean and dry NT no bleeding  Nursing note and vitals reviewed.   Imaging: Ct Head Wo Contrast  Result Date: 08/12/2017 CLINICAL DATA:  Altered level of consciousness. Recent large right MCA territory infarct. EXAM: CT HEAD WITHOUT CONTRAST TECHNIQUE: Contiguous axial images were obtained from the base of the skull through the vertex without intravenous contrast. COMPARISON:  MRI brain 07/24/2017 FINDINGS: Brain: Expected evolution of the large right MCA territory infarct is evident. There is now volume loss.  Previously-seen midline shift has resolved. Partial effacement of the right lateral ventricle has partially resolved. Cortical hyperdensity reflects petechial hemorrhages previously noted. Some of this hyperdensity may represent cortical laminar necrosis. No new infarct or hemorrhage is present. There are still some mass effect on the atrium of the right lateral ventricle. No significant extra-axial fluid collection is present. Vascular: Atherosclerotic calcifications are present in the cavernous internal carotid arteries bilaterally. There is no hyperdense vessel. Skull: Calvarium is intact. No focal lytic or blastic lesions are present. Sinuses/Orbits: Maxillary sinus wall thickening is present bilaterally without significant coastal disease. The paranasal sinuses are otherwise clear. The mastoid air cells are clear. Scleral banding is noted at the right globe. Globes and orbits are otherwise normal. IMPRESSION: 1. Expected evolution of large right MCA territory infarct. 2. Stable areas of hyperdensity compatible with petechial hemorrhage. 3. No new hemorrhage or significant expansion of the infarct. 4. Decreasing mass effect. Electronically Signed   By: Marin Robertshristopher  Mattern M.D.   On: 08/12/2017 10:29   Ir Gastrostomy Tube Mod Sed  Result Date: 08/13/2017 INDICATION: 77 year old male with a history of stroke, dysphagia EXAM: PERC PLACEMENT GASTROSTOMY MEDICATIONS: 2.0 g Ancef; Antibiotics were administered within 1 hour of the procedure. ANESTHESIA/SEDATION: Versed 1.0 mg IV; Fentanyl 50 mcg IV Moderate Sedation Time:  10 minutes The patient was continuously monitored during the procedure by the interventional radiology nurse under my direct supervision. CONTRAST:  10 cc-administered  into the gastric lumen. FLUOROSCOPY TIME:  Fluoroscopy Time: 0 minutes 54 seconds (9.0 mGy). COMPLICATIONS: None immediate. PROCEDURE: Informed written consent was obtained from the patient and the patient's family after a  thorough discussion of the procedural risks, benefits and alternatives. All questions were addressed. Maximal Sterile Barrier Technique was utilized including caps, mask, sterile gowns, sterile gloves, sterile drape, hand hygiene and skin antiseptic. A timeout was performed prior to the initiation of the procedure. The epigastrium was prepped with Betadine in a sterile fashion, and a sterile drape was applied covering the operative field. A sterile gown and sterile gloves were used for the procedure. A 5-French orogastric tube is placed under fluoroscopic guidance. Scout imaging of the abdomen confirms barium within the transverse colon. The stomach was distended with gas. Under fluoroscopic guidance, an 18 gauge needle was utilized to puncture the anterior wall of the body of the stomach. An Amplatz wire was advanced through the needle passing a T fastener into the lumen of the stomach. The T fastener was secured for gastropexy. A 9-French sheath was inserted. A snare was advanced through the 9-French sheath. A Teena Dunk was advanced through the orogastric tube. It was snared then pulled out the oral cavity, pulling the snare, as well. The leading edge of the gastrostomy was attached to the snare. It was then pulled down the esophagus and out the percutaneous site. Tube secured in place. Contrast was injected. Patient tolerated the procedure well and remained hemodynamically stable throughout. No complications were encountered and no significant blood loss encountered. IMPRESSION: Status post fluoroscopic placed percutaneous gastrostomy tube, with 20 [place] pull-through. Signed, Yvone Neu. Loreta Ave, DO [facility] Electronically Signed   By: Gilmer Mor D.O.   On: 08/13/2017 15:33    Labs:  CBC: Recent Labs    07/25/17 0314 07/29/17 0922 08/04/17 1100 08/11/17 0551  WBC 8.2 9.9 9.8 9.1  HGB 12.5* 12.3* 11.9* 11.8*  HCT 37.8* 37.1* 35.6* 36.5*  PLT  222 207 336 435*    COAGS: Recent Labs    07/23/17 1325 08/13/17 0540  INR 1.03 1.15  APTT 34  --     BMP: Recent Labs    08/09/17 0714 08/10/17 0700 08/11/17 0551 08/13/17 0540  NA 137 138 138 138  K 3.9 3.8 4.2 3.9  CL 108 110 109 109  CO2 20* 20* 19* 20*  GLUCOSE 102* 100* 102* 108*  BUN 37* 32* 30* 31*  CALCIUM 9.1 9.2 9.3 9.3  CREATININE 1.21 1.09 1.19 1.08  GFRNONAA 56* >60 58* >60  GFRAA >60 >60 >60 >60    LIVER FUNCTION TESTS: Recent Labs    06/29/17 1612 07/23/17 1325 07/29/17 0922  BILITOT 0.6 0.7 0.5  AST 25 20 21   ALT 11* 15* 13*  ALKPHOS 57 50 51  PROT 8.5* 7.7 6.9  ALBUMIN 4.5 3.9 2.7*    Assessment and Plan:  G tube placed in IR 3/14 May use now   Electronically Signed: Avya Flavell A, PA-C 08/14/2017, 10:47 AM   I spent a total of 15 Minutes at the the patient's bedside AND on the patient's hospital floor or unit, greater than 50% of which was counseling/coordinating care for G tube

## 2017-08-14 NOTE — Progress Notes (Signed)
Physical Therapy Session Note  Patient Details  Name: Noah RumpfJames Cervini Jr. MRN: [id number] Date of Birth: Feb 10, 1941  Today's Date: 08/14/2017 PT Individual Time: 1100-1200 PT Individual Time Calculation (min): 60 min   Short Term Goals: Week 3:  PT Short Term Goal 1 (Week 3): Pt will perform rolling maxA +1 PT Short Term Goal 2 (Week 3): Pt will perform supine <>sit maxA +1 PT Short Term Goal 3 (Week 3): Pt will perform lateral scoot transfer maxA +2 PT Short Term Goal 4 (Week 3): Pt will perform dynamic sitting balance with min guard x10 min while engaged in functional   Skilled Therapeutic Interventions/Progress Updates: Pt received seated in w/c, son present; denies pain and agreeable to treatment. Pt demo's mild improvements in attention, initiation and participation with son present. Lateral scoot transfer w/c >mat table totalA +2; strong L lateral lean and pushing with RUE during transfer. Performed static sitting balance with standbyA while facilitating L visual scanning and L attention to locate and name color of targets held at midline. Unable to correctly identify any targets presented, even with multimodal cueing and tactile facilitation for L cervical rotation. Educated pt's son on L inattention and implications into mobility tasks. Performed sit <>stand maxA +2 with "three muskateers" assist x2 trials; somewhat improved trunk extension and assist through LEs with son standing in front of pt. Performed two trials of three muskateer ambulation; first trial stand pivot and x3' forward with max/totalA +2. Second trial straight forward walking x7' with maxA +2; therapists facilitating bilateral weight shifting, assist for LLE progression, and forward flexed trunk throughout despite maximal assistance and cueing. Educated pt's son on recommendation at this time would be for family to use a lift for transfers w/c <>bed as pt is very heavy and not assisting much or at all during transfers. Pt  remained in w/c at end of session, quick release belt intact and son present, all needs in reach.      Therapy Documentation Precautions:  Precautions Precautions: Fall Precaution Comments: severe left neglect Restrictions Weight Bearing Restrictions: No Pain: Pain Assessment Pain Score: 0-No pain   See Function Navigator for Current Functional Status.   Therapy/Group: Individual Therapy  Vista Lawmanlizabeth J Tygielski 08/14/2017, 12:09 PM

## 2017-08-14 NOTE — Progress Notes (Signed)
Occupational Therapy Session Note  Patient Details  Name: Noah RumpfJames Shear Jr. MRN: [id number] Date of Birth: 06-Jun-1940  Today's Date: 08/14/2017 OT Individual Time: 0830-0900 OT Individual Time Calculation (min): 30 min     Skilled Therapeutic Interventions/Progress Updates:    Pt in bed reporting needing to void urine. Pt required total A +2 to come to EOB and then demonstrating resistance at EOB. Pt able to maintain sitting balance with min guard with urinal placed. Pt's brief already wet. Encourage and [facility] right for increase success with increase visual attention to right) but pt continue to resist and trying to push backwards into unsafe positions. [facility] and then transferred into tilt in space w/c with maxi move. Pt asleep before left the room.   Therapy Documentation Precautions:  Precautions Precautions: Fall Precaution Comments: severe left neglect Restrictions Weight Bearing Restrictions: No Pain:  c/o soreness in stomach due to PEG placement yesterday ADL: ADL ADL Comments: see functional navigator  See Function Navigator for Current Functional Status.   Therapy/Group: Individual Therapy  Roney MansSmith, [name] 08/14/2017, 3:56 PM

## 2017-08-14 NOTE — Progress Notes (Signed)
[place] PHYSICAL MEDICINE & REHABILITATION     PROGRESS NOTE  Subjective/Complaints:  Pt in bed. Appears comfortable. I asked him if his stomach hurt and he told me " a little".   ROS: Limited due to cognitive/behavioral  ;  Objective: Vital Signs: Blood pressure (!) 114/55, pulse 66, temperature 98.8 F (37.1 C), temperature source Axillary, resp. rate 20, height 5\' 11"  (1.803 m), weight 120.1 kg (264 lb 12.4 oz), SpO2 100 %. Ir Gastrostomy Tube Mod Sed  Result Date: 08/13/2017 INDICATION: 77 year old male with a history of stroke, dysphagia EXAM: PERC PLACEMENT GASTROSTOMY MEDICATIONS: 2.0 g Ancef; Antibiotics were administered within 1 hour of the procedure. ANESTHESIA/SEDATION: Versed 1.0 mg IV; Fentanyl 50 mcg IV Moderate Sedation Time:  10 minutes The patient was continuously monitored during the procedure by the interventional radiology nurse under my direct supervision. CONTRAST:  10 cc-administered into the gastric lumen. FLUOROSCOPY TIME:  Fluoroscopy Time: 0 minutes 54 seconds (9.0 mGy). COMPLICATIONS: None immediate. PROCEDURE: Informed written consent was obtained from the patient and the patient's family after a thorough discussion of the procedural risks, benefits and alternatives. All questions were addressed. Maximal Sterile Barrier Technique was utilized including caps, mask, sterile gowns, sterile gloves, sterile drape, hand hygiene and skin antiseptic. A timeout was performed prior to the initiation of the procedure. The epigastrium was prepped with Betadine in a sterile fashion, and a sterile drape was applied covering the operative field. A sterile gown and sterile gloves were used for the procedure. A 5-French orogastric tube is placed under fluoroscopic guidance. Scout imaging of the abdomen confirms barium within the transverse colon. The stomach was distended with gas. Under fluoroscopic guidance, an 18 gauge needle was utilized to puncture the anterior wall of the body of  the stomach. An Amplatz wire was advanced through the needle passing a T fastener into the lumen of the stomach. The T fastener was secured for gastropexy. A 9-French sheath was inserted. A snare was advanced through the 9-French sheath. A Teena DunkBenson was advanced through the orogastric tube. It was snared then pulled out the oral cavity, pulling the snare, as well. The leading edge of the gastrostomy was attached to the snare. It was then pulled down the esophagus and out the percutaneous site. Tube secured in place. Contrast was injected. Patient tolerated the procedure well and remained hemodynamically stable throughout. No complications were encountered and no significant blood loss encountered. IMPRESSION: Status post fluoroscopic placed percutaneous gastrostomy tube, with 20 JamaicaFrench pull-through. Signed, Yvone NeuJaime S. Loreta AveWagner, DO [facility] Electronically Signed   By: Gilmer MorJaime  Wagner D.O.   On: 08/13/2017 15:33   No results for input(s): WBC, HGB, HCT, PLT in the last 72 hours. Recent Labs    08/13/17 0540  NA 138  K 3.9  CL 109  GLUCOSE 108*  BUN 31*  CREATININE 1.08  CALCIUM 9.3   CBG (last 3)  Recent Labs    08/13/17 1632 08/13/17 2145 08/14/17 0644  GLUCAP 102* 87 96    Wt Readings from Last 3 Encounters:  08/13/17 120.1 kg (264 lb 12.4 oz)  07/23/17 124.3 kg (274 lb)  06/29/17 124.3 kg (274 lb)    Physical Exam:  BP (!) 114/55 (BP Location: Right Arm)   Pulse 66   Temp 98.8 F (37.1 C) (Axillary)   Resp 20   Ht 5\' 11"  (1.803 m)   Wt 120.1 kg (264 lb 12.4 oz)   SpO2 100%   BMI 36.93  kg/m  Constitutional: No distress . Vital signs reviewed. HEENT: EOMI, oral membranes moist Cardiovascular: RRR without murmur. No JVD    Respiratory: CTA Bilaterally without wheezes or rales. Normal effort    GI: BS +, sl tender. PEG intact with minimal drainage  Musculoskeletal: Right 3rd/4th fingers splinted, k-tape--hand generally  tender, no abnl swelling---stable Neurological:  Fairly alert. Followed simple commands Left facial weakness with ongoing dysarthria Able to follow simple motor commands with max cues.  Motor: RUE 3+/5 proximal to distal with limitations at left hand RLE 3-/5 proximal to distal  LUE: 2+ to 3/5 proximal to distal LLE: 1/5 proximal to distal---no real changes Skin: Skin is dry,warm   Psychiatric: flat   Assessment/Plan: 1. 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 Physiatrist is providing close team supervision and 24 hour management of active medical problems listed below. Physiatrist and rehab team continue to assess barriers to discharge/monitor patient progress toward functional and medical goals.  Function:  Bathing Bathing position   Position: Bed  Bathing parts Body parts bathed by patient: Abdomen, Chest Body parts bathed by helper: Right arm, Left arm, Front perineal area, Buttocks, Right upper leg, Left upper leg, Back, Left lower leg, Right lower leg, Chest, Abdomen  Bathing assist Assist Level: 2 helpers      Upper Body Dressing/Undressing Upper body dressing   What is the patient wearing?: Pull over shirt/dress       Pull over shirt/dress - Perfomed by helper: Thread/unthread right sleeve, Thread/unthread left sleeve, Put head through opening, Pull shirt over trunk        Upper body assist Assist Level: 2 helpers      Lower Body Dressing/Undressing Lower body dressing   What is the patient wearing?: Hospital Gown       Pants- Performed by helper: Thread/unthread right pants leg, Thread/unthread left pants leg, Pull pants up/down   Non-skid slipper socks- Performed by helper: Don/doff right sock, Don/doff left sock                  Lower body assist Assist for lower body dressing: 2  Helpers      Toileting Toileting Toileting activity did not occur: No continent bowel/bladder event   Toileting steps completed by helper: Adjust clothing prior to toileting, Performs perineal hygiene, Adjust clothing after toileting    Toileting assist Assist level: Two helpers   Transfers Chair/bed transfer   Chair/bed transfer method: Other Chair/bed transfer assist level: 2 helpers Chair/bed transfer assistive device: Mechanical lift Mechanical lift: Maximove   Locomotion Ambulation Ambulation activity did not occur: Safety/medical concerns   Max distance: 10 Assist level: 2 helpers   Wheelchair   Type: Manual   Assist Level: Dependent (Pt equals 0%)(dependently transported to gym)  Cognition Comprehension Comprehension assist level: Understands basic 25 - 49% of the time/ requires cueing 50 - 75% of the time  Expression Expression assist level: Expresses basic 25 - 49% of the time/requires cueing 50 - 75% of the time. Uses single words/gestures.  Social Interaction Social Interaction assist level: Interacts appropriately 25 - 49% of time - Needs frequent redirection.  Problem Solving Problem solving assist level: Solves basic less than 25% of the time - needs direction nearly all the time or does not effectively solve problems and may need a restraint for safety  Memory Memory assist level: Recognizes or recalls  less than 25% of the time/requires cueing greater than 75% of the time    Medical Problem List and Plan: 1.  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   Cont CIR--will likely need SNF   -recent head CT demonstrates expected evolution of infarct 2.  DVT Prophylaxis/Anticoagulation: start  Pharmaceutical: Lovenox 3. Pain Management: tylenol prn. Added Sportscreme to help with joint stiffness bilateral hands and left knee.    -Right  hand middle finger pain, base of fracture nondisplaced     = splint, Ortho follow-up after hospitalization--- ongoing discomfort in right hand   -Right first MTP pain likely gout. xrays negative     4. Mood: Team to provide encouragement/ego support. LCSW to follow for evaluation when appropriate.  5. Alzheimer's dementia/ Neuropsych: This patient is not capable of making decisions on his own behalf. Continue Namenda    -continue methylphenidate 5mg  bid to see if we can improve initiation and arousal--no changes thus far.    -titration to 10mg  bid 6. Skin/Wound Care: Pressure relief measures. Maintain adequate nutritional and hydration status.  7. Fluids/Electrolytes/Nutrition: poor intake   - PEG placed 08/13/17. appreciate INR help!   -await clearance to use tube.    -continue IVF low rate-at HS. Change to TF flushes once PEG usable   prealbumin low       8. T2DM: Monitor BS ac/hs. Continue SSI.    -controlled 3/15 CBG (last 3)  Recent Labs    08/13/17 1632 08/13/17 2145 08/14/17 0644  GLUCAP 102* 87 96   9. HTN: Monitor BP bid-       Vitals:   08/14/17 0300 08/14/17 0935  BP: (!) 148/75 (!) 114/55  Pulse: 76 66  Resp:  20  Temp: 98.8 F (37.1 C)   SpO2: 98% 100%    Metoprolol XL to 25 mg daily on 3/6 decreased to 12.5 on 3/10 to avoid hypoperfusion--  10. CKD stage III:  .    Cr  1.19     BUN continues to trend downward   Will need to continue IVF until PEG placed 11. Dyslipidemia: now on Lipitor.   12. Morbid obesity             Body mass index is 38.22 kg/m.             Diet and exercise education 13. Acute blood loss anemia   Hemoglobin 11.9  3/12   -Hemoglobin 11.8 14.  Bowel incontinence this is likely related to his cognitive status, nursing working on achieving continence 15. Bladder: consider removing condom cath although it will likely lead to more incontinence.    16. Cough: Improved   -Continue Robitussin scheduled--- changed to as needed   -Aspiration  precautions     LOS (Days) 17 A FACE TO FACE EVALUATION WAS PERFORMED  Ranelle Oyster 08/14/2017 10:43 AM

## 2017-08-15 ENCOUNTER — Ambulatory Visit (HOSPITAL_COMMUNITY): Payer: Medicare Other | Admitting: Physical Therapy

## 2017-08-15 ENCOUNTER — Inpatient Hospital Stay (HOSPITAL_COMMUNITY): Payer: Medicare Other | Admitting: Occupational Therapy

## 2017-08-15 ENCOUNTER — Inpatient Hospital Stay (HOSPITAL_COMMUNITY): Payer: Medicare Other | Admitting: Speech Pathology

## 2017-08-15 LAB — GLUCOSE, CAPILLARY
GLUCOSE-CAPILLARY: 88 mg/dL (ref 65–99)
Glucose-Capillary: 137 mg/dL — ABNORMAL HIGH (ref 65–99)
Glucose-Capillary: 127 mg/dL — ABNORMAL HIGH (ref 65–99)
Glucose-Capillary: 152 mg/dL — ABNORMAL HIGH (ref 65–99)

## 2017-08-15 MED ORDER — FREE WATER
200.0000 mL | Freq: Three times a day (TID) | Status: DC
  Administered 2017-08-15 – 2017-08-17 (×8): 200 mL

## 2017-08-15 NOTE — Progress Notes (Signed)
Occupational Therapy Session Note  Patient Details  Name: Noah RumpfJames Burklow Jr. MRN: [id number] Date of Birth: 1941-05-23  Today's Date: 08/15/2017 OT Individual Time: 1030-1100 OT Individual Time Calculation (min): 30 min    Short Term Goals: Week 3:  OT Short Term Goal 1 (Week 3): Pt will roll to the R in bed with max A. OT Short Term Goal 2 (Week 3): Pt will roll to the L in bed with max A. OT Short Term Goal 3 (Week 3): Pt will demonstrate improved use of UE by washing face and chest with washcloth with mod VC. OT Short Term Goal 4 (Week 3): Pt will be able to sit upright at EOB with mod A.   Skilled Therapeutic Interventions/Progress Updates:    Upon entering the room, pt seated in wheelchair with grandson present for hands on family education. OT educating caregiver on current OT goals and pt's current level of assistance. He verbalized understanding. Caregiver demonstrated how to place hoyer sling under pt in wheelchair with therapist assistance to manage pt for safety. Caregiver managed hoyer lift functions to safely move pt to bed with min verbal cues for technique and safety. Pt rolled L and R to check brief with total A of 2 and pt returned to tilt in space in same manner for continued hands on training with caregiver. Caregiver with no further questions at this time. Pt remained in wheelchair tilted with quick release belt donned.    Therapy Documentation Precautions:  Precautions Precautions: Fall Precaution Comments: severe left neglect Restrictions Weight Bearing Restrictions: No    ADL: ADL ADL Comments: see functional navigator   See Function Navigator for Current Functional Status.   Therapy/Group: Individual Therapy  Noah Bush, Noah Bush 08/15/2017, 12:32 PM

## 2017-08-15 NOTE — Progress Notes (Signed)
Speech Language Pathology Daily Session Note  Patient Details  Name: Noah RumpfJames Beagle Jr. MRN: [id number] Date of Birth: 1941/04/08  Today's Date: 08/15/2017 SLP Individual Time: 1000-1025 SLP Individual Time Calculation (min): 25 min  Short Term Goals: Week 3: SLP Short Term Goal 1 (Week 3): Pt will sustain his attention to basic, familiar tasks for 10 minute with max verbal cues for redirection.  SLP Short Term Goal 2 (Week 3): Pt will utilize external aids to reorient to place, date, and situation in 50% of opportunties with max assist verbal cues.  SLP Short Term Goal 3 (Week 3): Pt will consume dys 1 textures and thin liquids with min cues for use of swallowing precautions and minimal overt s/s of aspiration. SLP Short Term Goal 4 (Week 3): Pt will demonstrate functional problem solving during ADL tasks in 50% of opportunies with max A multimodal cues.  Skilled Therapeutic Interventions: Skilled treatment session focused on cognitive goals and family education with the patient's grandson. Patient did not eat his breakfast and declined a snack but was agreeable to to drinking some water. Patient required hand over hand assist for self-feeding which his grandson provided with supervision verbal cues.  Patient consumed liquids without overt s/s of aspiration. Patient's grandson educated in regards to his current cognitive impairments and how patient requires overall total A to complete any functional task due to impaired attention, recall, attention to left field of environment/body and awareness. Patient's grandson verbalized understanding and asking appropriate questions in regards to prognosis, etc. Patient left upright in wheelchair with grandson present. Continue with current plan of care.      Function:   Cognition Comprehension Comprehension assist level: Understands basic 25 - 49% of the time/ requires cueing 50 - 75% of the time  Expression   Expression assist level: Expresses basic 25  - 49% of the time/requires cueing 50 - 75% of the time. Uses single words/gestures.  Social Interaction Social Interaction assist level: Interacts appropriately 25 - 49% of time - Needs frequent redirection.  Problem Solving Problem solving assist level: Solves basic less than 25% of the time - needs direction nearly all the time or does not effectively solve problems and may need a restraint for safety  Memory Memory assist level: Recognizes or recalls 25 - 49% of the time/requires cueing 50 - 75% of the time    Pain Pain Assessment Pain Assessment: 0-10 Pain Score: 0-No pain  Therapy/Group: Individual Therapy  Alyshia Kernan 08/15/2017, 11:24 AM

## 2017-08-15 NOTE — Progress Notes (Signed)
Physical Therapy Session Note  Patient Details  Name: Noah RumpfJames Savell Jr. MRN: [id number] Date of Birth: 10/26/1940  Today's Date: 08/15/2017 PT Individual Time: [id number] PT Individual Time Calculation (min): 45 min   Short Term Goals: Week 3:  PT Short Term Goal 1 (Week 3): Pt will perform rolling maxA +1 PT Short Term Goal 2 (Week 3): Pt will perform supine <>sit maxA +1 PT Short Term Goal 3 (Week 3): Pt will perform lateral scoot transfer maxA +2 PT Short Term Goal 4 (Week 3): Pt will perform dynamic sitting balance with min guard x10 min while engaged in functional   Skilled Therapeutic Interventions/Progress Updates: Pt received supine in bed, very lethargic throughout session and difficult to keep awake. Rolling R/L to check brief totalA; pt with clean brief however then reports he needs to go to the bathroom and voids incontinently of urine before therapist able to obtain urinal. Rolling R/L totalA again to change brief and place maxi sling. Transferred dependently to w/c with maximove. Pt's grandson arrived for family education with less than 20 minutes left of session. Educated in depth about current recommendations for use of lift for transferring patient, discussed L inattention/neglect and pusher syndrome limiting participation in therapy and making other forms of transfers more difficult and potentially unsafe. Provided handout on neglect/pusher syndrome. Pt and therapist placed manual hoyer sling behind pt and pt's grandson demonstrates lifting pt up in sling, however returned to w/c after as pt would benefit from being up in w/c for next speech therapy session. Again reinforced importance of lift for safety of both patient and caregivers, as well as allowing other family members who are not as physically able to help with transfers in the event both son and grandson are not present. Pt occasionally opens eyes to interact with grandson, does demonstrate awareness of time with pt asking  about score of [employer] game last night, but otherwise minimally participates in therapy session. Remained semi-reclined in w/c at end of session, grandson present and all needs in reach.      Therapy Documentation Precautions:  Precautions Precautions: Fall Precaution Comments: severe left neglect Restrictions Weight Bearing Restrictions: No Pain: Pain Assessment Pain Assessment: 0-10 Pain Score: 0-No pain   See Function Navigator for Current Functional Status.   Therapy/Group: Individual Therapy  Vista Lawmanlizabeth J Tygielski 08/15/2017, 10:11 AM

## 2017-08-15 NOTE — Progress Notes (Signed)
[facility] PHYSICAL MEDICINE & REHABILITATION     PROGRESS NOTE  Subjective/Complaints:  Patient lying in bed.  Uneventful night.  ROS: Patient denies fever, rash, sore throat, blurred vision, nausea, vomiting, diarrhea, cough, shortness of breath or chest pain, joint or back pain, headache, or mood change.   Objective: Vital Signs: Blood pressure (!) 144/70, pulse 70, temperature 98.7 F (37.1 C), temperature source Oral, resp. rate 20, height 5\' 11"  (1.803 m), weight 120.1 kg (264 lb 12.4 oz), SpO2 97 %. Ir Gastrostomy Tube Mod Sed  Result Date: 08/13/2017 INDICATION: 77 year old male with a history of stroke, dysphagia EXAM: PERC PLACEMENT GASTROSTOMY MEDICATIONS: 2.0 g Ancef; Antibiotics were administered within 1 hour of the procedure. ANESTHESIA/SEDATION: Versed 1.0 mg IV; Fentanyl 50 mcg IV Moderate Sedation Time:  10 minutes The patient was continuously monitored during the procedure by the interventional radiology nurse under my direct supervision. CONTRAST:  10 cc-administered into the gastric lumen. FLUOROSCOPY TIME:  Fluoroscopy Time: 0 minutes 54 seconds (9.0 mGy). COMPLICATIONS: None immediate. PROCEDURE: Informed written consent was obtained from the patient and the patient's family after a thorough discussion of the procedural risks, benefits and alternatives. All questions were addressed. Maximal Sterile Barrier Technique was utilized including caps, mask, sterile gowns, sterile gloves, sterile drape, hand hygiene and skin antiseptic. A timeout was performed prior to the initiation of the procedure. The epigastrium was prepped with Betadine in a sterile fashion, and a sterile drape was applied covering the operative field. A sterile gown and sterile gloves were used for the procedure. A 5-French orogastric tube is placed under fluoroscopic guidance. Scout imaging of the abdomen confirms barium within the transverse colon. The stomach was distended with gas. Under fluoroscopic guidance,  an 18 gauge needle was utilized to puncture the anterior wall of the body of the stomach. An Amplatz wire was advanced through the needle passing a T fastener into the lumen of the stomach. The T fastener was secured for gastropexy. A 9-French sheath was inserted. A snare was advanced through the 9-French sheath. A Teena DunkBenson was advanced through the orogastric tube. It was snared then pulled out the oral cavity, pulling the snare, as well. The leading edge of the gastrostomy was attached to the snare. It was then pulled down the esophagus and out the percutaneous site. Tube secured in place. Contrast was injected. Patient tolerated the procedure well and remained hemodynamically stable throughout. No complications were encountered and no significant blood loss encountered. IMPRESSION: Status post fluoroscopic placed percutaneous gastrostomy tube, with 20 JamaicaFrench pull-through. Signed, Yvone NeuJaime S. Loreta AveWagner, DO [facility] Electronically Signed   By: Gilmer MorJaime  Wagner D.O.   On: 08/13/2017 15:33   No results for input(s): WBC, HGB, HCT, PLT in the last 72 hours. Recent Labs    08/13/17 0540  NA 138  K 3.9  CL 109  GLUCOSE 108*  BUN 31*  CREATININE 1.08  CALCIUM 9.3   CBG (last 3)  Recent Labs    08/14/17 1635 08/14/17 2120 08/15/17 0651  GLUCAP 156* 150* 88    Wt Readings from Last 3 Encounters:  08/13/17 120.1 kg (264 lb 12.4 oz)  07/23/17 124.3 kg (274 lb)  06/29/17 124.3 kg (274 lb)    Physical Exam:  BP (!) 144/70 (BP Location: Left Arm)   Pulse 70   Temp 98.7 F (37.1 C) (Oral)   Resp 20   Ht 5\' 11"  (1.803 m)   Wt 120.1 kg (264 lb 12.4 oz)  SpO2 97%   BMI 36.93 kg/m  Constitutional: No distress . Vital signs reviewed. obese HEENT: EOMI, oral membranes moist Cardiovascular: RRR without murmur. No JVD    Respiratory: CTA Bilaterally without wheezes or rales. Normal effort    GI: BS +, non-tender, non-distended. Peg site  intact  Musculoskeletal: Right 3rd/4th fingers splinted, k-tape--hand generally tender, no abnl swelling---stable Neurological:  Fairly alert. Followed simple commands Left facial weakness with ongoing dysarthria Able to follow simple motor commands with max cues.  Motor: RUE 3+/5 proximal to distal with limitations at left hand RLE 3-/5 proximal to distal  LUE: 2+ to 3/5 proximal to distal LLE: 1/5 proximal to distal--- motor exam unchanged Skin: Skin is dry,warm   Psychiatric: Remains flat  Assessment/Plan: 1. 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 Physiatrist is providing close team supervision and 24 hour management of active medical problems listed below. Physiatrist and rehab team continue to assess barriers to discharge/monitor patient progress toward functional and medical goals.  Function:  Bathing Bathing position   Position: Bed  Bathing parts Body parts bathed by patient: Abdomen, Chest Body parts bathed by helper: Right arm, Left arm, Front perineal area, Buttocks, Right upper leg, Left upper leg, Back, Left lower leg, Right lower leg, Chest, Abdomen  Bathing assist Assist Level: 2 helpers      Upper Body Dressing/Undressing Upper body dressing   What is the patient wearing?: Pull over shirt/dress       Pull over shirt/dress - Perfomed by helper: Thread/unthread right sleeve, Thread/unthread left sleeve, Put head through opening, Pull shirt over trunk        Upper body assist Assist Level: 2 helpers      Lower Body Dressing/Undressing Lower body dressing   What is the patient wearing?: Hospital Gown       Pants- Performed by helper: Thread/unthread right pants leg, Thread/unthread left pants leg, Pull pants up/down   Non-skid slipper socks- Performed by helper: Don/doff right sock,  Don/doff left sock                  Lower body assist Assist for lower body dressing: 2 Helpers      Toileting Toileting Toileting activity did not occur: No continent bowel/bladder event   Toileting steps completed by helper: Adjust clothing prior to toileting, Performs perineal hygiene, Adjust clothing after toileting    Toileting assist Assist level: Two helpers   Transfers Chair/bed transfer   Chair/bed transfer method: Lateral scoot Chair/bed transfer assist level: 2 helpers Chair/bed transfer assistive device: Mechanical lift Mechanical lift: Maximove   Locomotion Ambulation Ambulation activity did not occur: Safety/medical concerns   Max distance: 7 Assist level: 2 helpers   Wheelchair   Type: Manual   Assist Level: Dependent (Pt equals 0%)(dependently transported to gym)  Cognition Comprehension Comprehension assist level: Understands basic 25 - 49% of the time/ requires cueing 50 - 75% of the time  Expression Expression assist level: Expresses basic 25 - 49% of the time/requires cueing 50 - 75% of the time. Uses single words/gestures.  Social Interaction Social Interaction assist level: Interacts appropriately 25 - 49% of time - Needs frequent redirection.  Problem Solving Problem solving assist level: Solves basic less than 25% of the time - needs direction nearly all the time or does not effectively solve problems and may need a restraint for safety  Memory Memory assist level: Recognizes or recalls 25 - 49% of the time/requires cueing 50 - 75% of the time    Medical Problem List and Plan: 1.  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   Cont CIR--will likely need SNF   -recent head CT demonstrates expected evolution of infarct 2.  DVT Prophylaxis/Anticoagulation: start  Pharmaceutical: Lovenox 3. Pain Management: tylenol prn.  Added Sportscreme to help with joint stiffness bilateral hands and left knee.    -Right hand middle finger pain, base of fracture nondisplaced     = splint, Ortho follow-up after hospitalization--- ongoing discomfort in right hand   -Right first MTP pain likely gout. xrays negative     4. Mood: Team to provide encouragement/ego support. LCSW to follow for evaluation when appropriate.  5. Alzheimer's dementia/ Neuropsych: This patient is not capable of making decisions on his own behalf. Continue Namenda    -continue methylphenidate 5mg  bid to see if we can improve initiation and arousal--no changes thus far.    -titrated to 10mg  bid to optimize arousal and initiation 6. Skin/Wound Care: Pressure relief measures. Maintain adequate nutritional and hydration status.  7. Fluids/Electrolytes/Nutrition: poor intake   - PEG placed 08/13/17. appreciate INR help   -Tube feeds initiated with the assistance of nutrition mgt   -Discontinue IV fluids and add flushes through PEG   prealbumin low    -Recheck bmet on Monday   8. T2DM: Monitor BS ac/hs. Continue SSI.    -controlled 3/16 CBG (last 3)  Recent Labs    08/14/17 1635 08/14/17 2120 08/15/17 0651  GLUCAP 156* 150* 88   9. HTN: Monitor BP bid-       Vitals:   08/14/17 1417 08/15/17 0410  BP: 109/69 (!) 144/70  Pulse: 87 70  Resp: 20 20  Temp: 98.9 F (37.2 C) 98.7 F (37.1 C)  SpO2: 99% 97%    Metoprolol XL to 25 mg daily on 3/6 decreased to 12.5 on 3/10 to avoid hypoperfusion--  10. CKD stage III:  .    Cr  1.19     BUN continues to trend downward   Follow-up Monday 11. Dyslipidemia: now on Lipitor.   12. Morbid obesity             Body mass index is 38.22 kg/m.             Diet and exercise education 13. Acute blood loss anemia   Hemoglobin 11.9  3/12   -Hemoglobin 11.8 14.  Bowel incontinence:  working on achieving continence 15. Bladder: incontinent  16. Cough: Improved   -Continue Robitussin scheduled--- changed to as  needed   -Aspiration precautions     LOS (Days) 18 A FACE TO FACE EVALUATION WAS PERFORMED  Ranelle Oyster 08/15/2017 8:30 AM

## 2017-08-16 ENCOUNTER — Inpatient Hospital Stay (HOSPITAL_COMMUNITY): Payer: Medicare Other

## 2017-08-16 ENCOUNTER — Inpatient Hospital Stay (HOSPITAL_COMMUNITY): Payer: Medicare Other | Admitting: Physical Therapy

## 2017-08-16 LAB — GLUCOSE, CAPILLARY
GLUCOSE-CAPILLARY: 169 mg/dL — AB (ref 65–99)
Glucose-Capillary: 122 mg/dL — ABNORMAL HIGH (ref 65–99)
Glucose-Capillary: 175 mg/dL — ABNORMAL HIGH (ref 65–99)
Glucose-Capillary: 112 mg/dL — ABNORMAL HIGH (ref 65–99)

## 2017-08-16 NOTE — Progress Notes (Signed)
[facility] PHYSICAL MEDICINE & REHABILITATION     PROGRESS NOTE  Subjective/Complaints:  Uneventful night per nurse.  Patient resting comfortably when I arrived in room.  ROS: Limited due to cognitive/behavioral .   Objective: Vital Signs: Blood pressure 110/65, pulse 68, temperature 98 F (36.7 C), temperature source Oral, resp. rate 18, height 5\' 11"  (1.803 m), weight 120.1 kg (264 lb 12.4 oz), SpO2 98 %. No results found. No results for input(s): WBC, HGB, HCT, PLT in the last 72 hours. No results for input(s): NA, K, CL, GLUCOSE, BUN, CREATININE, CALCIUM in the last 72 hours.  Invalid input(s): CO CBG (last 3)  Recent Labs    08/15/17 1636 08/15/17 2052 08/16/17 0654  GLUCAP 127* 152* 122*    Wt Readings from Last 3 Encounters:  08/13/17 120.1 kg (264 lb 12.4 oz)  07/23/17 124.3 kg (274 lb)  06/29/17 124.3 kg (274 lb)    Physical Exam:  BP 110/65 (BP Location: Right Arm)   Pulse 68   Temp 98 F (36.7 C) (Oral)   Resp 18   Ht 5\' 11"  (1.803 m)   Wt 120.1 kg (264 lb 12.4 oz)   SpO2 98%   BMI 36.93 kg/m  Constitutional: No distress . Vital signs reviewed. HEENT: EOMI, oral membranes moist Cardiovascular: RRR without murmur. No JVD    Respiratory: CTA Bilaterally without wheezes or rales. Normal effort    GI: BS +, non-tender, non-distended. PEG site intact  Musculoskeletal: Right 3rd/4th fingers splinted, k-tape--hand generally tender, no abnl swelling---stable Neurological:  Fairly alert. Followed simple commands Left facial weakness with ongoing dysarthria Able to follow simple motor commands with max cues.  Motor: RUE 3+/5 proximal to distal with limitations at left hand RLE 3-/5 proximal to distal  LUE: 2+ to 3/5 proximal to distal LLE: 1/5 proximal to distal--- essentially stable Skin: Skin is dry,warm   Psychiatric: Remains flat but cooperative  Assessment/Plan: 1. Functional deficits secondary to large acute on subacute R-MCA territory infarct with  petechial hemorrhage which require 3+ hours per day of interdisciplinary therapy in a comprehensive inpatient rehab setting. Physiatrist is providing close team supervision and 24 hour management of active medical problems listed below. Physiatrist and rehab team continue to assess barriers to discharge/monitor patient progress toward functional and medical goals.  Function:  Bathing Bathing position   Position: Bed  Bathing parts Body parts bathed by patient: Abdomen, Chest Body parts bathed by helper: Right arm, Left arm, Front perineal area, Buttocks, Right upper leg, Left upper leg, Back, Left lower leg, Right lower leg, Chest, Abdomen  Bathing assist Assist Level: 2 helpers      Upper Body Dressing/Undressing Upper body dressing   What is the patient wearing?: Pull over shirt/dress       Pull over shirt/dress - Perfomed by helper: Thread/unthread right sleeve, Thread/unthread left sleeve, Put head through opening, Pull shirt over trunk        Upper body assist Assist Level: 2 helpers      Lower Body Dressing/Undressing Lower body dressing   What is the patient wearing?: Hospital Gown       Pants- Performed by helper: Thread/unthread right pants leg, Thread/unthread left pants leg, Pull pants up/down   Non-skid slipper socks- Performed by helper: Don/doff right sock, Don/doff left sock                  Lower body assist Assist for lower body dressing: 2 Surveyor, minerals  Toileting activity did not occur: No continent bowel/bladder event   Toileting steps completed by helper: Adjust clothing prior to toileting, Performs perineal hygiene, Adjust clothing after toileting    Toileting assist Assist level: Two helpers   Transfers Chair/bed transfer   Chair/bed transfer method: Other Chair/bed transfer assist level: dependent (Pt equals 0%) Chair/bed transfer assistive device: Mechanical lift Mechanical lift: Other(hoyer)    Locomotion Ambulation Ambulation activity did not occur: Safety/medical concerns   Max distance: 7 Assist level: 2 helpers   Wheelchair   Type: Manual   Assist Level: Dependent (Pt equals 0%)(dependently transported to gym)  Cognition Comprehension Comprehension assist level: Understands basic 25 - 49% of the time/ requires cueing 50 - 75% of the time  Expression Expression assist level: Expresses basic 25 - 49% of the time/requires cueing 50 - 75% of the time. Uses single words/gestures.  Social Interaction Social Interaction assist level: Interacts appropriately 25 - 49% of time - Needs frequent redirection.  Problem Solving Problem solving assist level: Solves basic less than 25% of the time - needs direction nearly all the time or does not effectively solve problems and may need a restraint for safety  Memory Memory assist level: Recognizes or recalls 25 - 49% of the time/requires cueing 50 - 75% of the time    Medical Problem List and Plan: 1.  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   Cont CIR--will likely need SNF   -recent head CT demonstrates expected evolution of infarct 2.  DVT Prophylaxis/Anticoagulation: start  Pharmaceutical: Lovenox 3. Pain Management: tylenol prn. Added Sportscreme to help with joint stiffness bilateral hands and left knee.    -Right hand middle finger pain, base of fracture nondisplaced     = splint, Ortho follow-up after hospitalization--- ongoing discomfort in right hand   -Right first MTP pain likely gout. xrays negative     4. Mood: Team to provide encouragement/ego support. LCSW to follow for evaluation when appropriate.  5. Alzheimer's dementia/ Neuropsych: This patient is not capable of making decisions on his own behalf. Continue Namenda    -continue methylphenidate 5mg  bid to see if we can improve initiation  and arousal--no changes thus far.    -titrated to 10mg  bid to optimize arousal and initiation 6. Skin/Wound Care: Pressure relief measures. Maintain adequate nutritional and hydration status.  7. Fluids/Electrolytes/Nutrition: poor intake   - PEG placed 08/13/17. appreciate INR help   -Tube feeds initiated with the assistance of nutrition mgt   -Free water flushes through PEG    -Recheck bmet on Monday .  Also check pre-albumin 8. T2DM: Monitor BS ac/hs. Continue SSI.    -controlled 3/16 CBG (last 3)  Recent Labs    08/15/17 1636 08/15/17 2052 08/16/17 0654  GLUCAP 127* 152* 122*   9. HTN: Monitor BP bid-       Vitals:   08/15/17 1314 08/16/17 0224  BP: (!) 107/53 110/65  Pulse: 66 68  Resp: 20 18  Temp: 97.7 F (36.5 C) 98 F (36.7 C)  SpO2: 100% 98%    Metoprolol XL to 25 mg daily on 3/6 decreased to 12.5 on 3/10 to avoid hypoperfusion--  10. CKD stage III:  .    Cr  1.19     BUN continues to trend downward   Follow-up Monday 11. Dyslipidemia: now on Lipitor.   12. Morbid obesity  Body mass index is 38.22 kg/m.             Diet and exercise education 13. Acute blood loss anemia   Hemoglobin 11.9  3/12   -Hemoglobin 11.8 14.  Bowel incontinence:  working on achieving continence 15. Bladder: incontinent  16. Cough: Improved   -Continue Robitussin scheduled--- changed to as needed   -Aspiration precautions     LOS (Days) 19 A FACE TO FACE EVALUATION WAS PERFORMED  Ranelle Oyster 08/16/2017 7:52 AM

## 2017-08-16 NOTE — Progress Notes (Signed)
Physical Therapy Session Note  Patient Details  Name: Noah RumpfJames Johnsey Jr. MRN: [id number] Date of Birth: Feb 11, 1941  Today's Date: 08/16/2017 PT Individual Time: 1030-1100 PT Individual Time Calculation (min): 30 min   Short Term Goals: Week 3:  PT Short Term Goal 1 (Week 3): Pt will perform rolling maxA +1 PT Short Term Goal 2 (Week 3): Pt will perform supine <>sit maxA +1 PT Short Term Goal 3 (Week 3): Pt will perform lateral scoot transfer maxA +2 PT Short Term Goal 4 (Week 3): Pt will perform dynamic sitting balance with min guard x10 min while engaged in functional task  Skilled Therapeutic Interventions/Progress Updates: Pt received supine in bed, denies pain and agreeable to treatment. Rolling R/L totalA to check brief. Supine>sit with HOB elevated, totalA +2 with pt not initiating assist with task. Pt verbalizing need to toilet; [facility] repeatedly declining saying he's "not ready yet" and resists when therapist/aide attempt to transfer. Transfer to L side into w/c totalA +2. Pt's neighbor arrived to visit; attempted to engage pt in L visual scanning to locate friend in room however unable to visually focus on friend until moved into R side. Remained semi-reclined in w/c at end of session, quick release belt intact and all needs in reach.      Therapy Documentation Precautions:  Precautions Precautions: Fall Precaution Comments: severe left neglect Restrictions Weight Bearing Restrictions: No Pain: Pain Assessment Pain Assessment: No/denies pain Pain Score: 0-No pain   See Function Navigator for Current Functional Status.   Therapy/Group: Individual Therapy  Vista Lawmanlizabeth J Tygielski 08/16/2017, 11:02 AM

## 2017-08-16 NOTE — Progress Notes (Signed)
Occupational Therapy Session Note  Patient Details  Name: Noah RumpfJames Conery Jr. MRN: [id number] Date of Birth: 03-31-1941  Today's Date: 08/16/2017 OT Individual Time: 1400-1439 OT Individual Time Calculation (min): 39 min    Short Term Goals: Week 3:  OT Short Term Goal 1 (Week 3): Pt will roll to the R in bed with max A. OT Short Term Goal 2 (Week 3): Pt will roll to the L in bed with max A. OT Short Term Goal 3 (Week 3): Pt will demonstrate improved use of UE by washing face and chest with washcloth with mod VC. OT Short Term Goal 4 (Week 3): Pt will be able to sit upright at EOB with mod A.   Skilled Therapeutic Interventions/Progress Updates:    1:1. Pt requires increased time and intermittent use of cold wash cloth to increase arousal throughout session. Pt oriented to self and states he is "at my sons house," however later pt able to state he is at the "hospital, waiting for something to happen." Pt with no c/o pain. Pt rolls B with total +2 for assistance, however pt states, "that's enough of that dang rolling." Pt unable to scan L of midline without facilitation of cervical rotation. OT provides cervical PROM for rotation, lateral flexion, and flexion extension. Pt able to to head slightly L of midline to drink from a cup with HOH A to bring cup to mouth with LUE. Pt states, "Im gonna go get my [id number]" when asked what a 38 was, pt stattes, "something to kill you with. Ive had too much therapy." Attempted to engage pt in shaving activity of spreading shaving cream on face as pt touches face and comments on roughness, but pt refuses and closes eyes. Exited session with pt seate in w/c call light in reach and all need smet  Therapy Documentation Precautions:  Precautions Precautions: Fall Precaution Comments: severe left neglect Restrictions Weight Bearing Restrictions: No  See Function Navigator for Current Functional Status.   Therapy/Group: Individual Therapy  Noah Bush  Noah Bush 08/16/2017, 2:41 PM

## 2017-08-17 ENCOUNTER — Inpatient Hospital Stay (HOSPITAL_COMMUNITY): Payer: Medicare Other | Admitting: Physical Therapy

## 2017-08-17 ENCOUNTER — Inpatient Hospital Stay (HOSPITAL_COMMUNITY): Payer: Medicare Other

## 2017-08-17 LAB — BASIC METABOLIC PANEL
Anion gap: 11 (ref 5–15)
BUN: 31 mg/dL — AB (ref 6–20)
CHLORIDE: 107 mmol/L (ref 101–111)
CO2: 22 mmol/L (ref 22–32)
Calcium: 9.5 mg/dL (ref 8.9–10.3)
Creatinine, Ser: 1.1 mg/dL (ref 0.61–1.24)
GFR calc Af Amer: >60 mL/min (ref >60)
GFR calc non Af Amer: >60 mL/min (ref >60)
GLUCOSE: 131 mg/dL — AB (ref 65–99)
POTASSIUM: 4.1 mmol/L (ref 3.5–5.1)
Sodium: 140 mmol/L (ref 135–145)

## 2017-08-17 LAB — PREALBUMIN: PREALBUMIN: 17.5 mg/dL — AB (ref 18–38)

## 2017-08-17 LAB — GLUCOSE, CAPILLARY
GLUCOSE-CAPILLARY: 134 mg/dL — AB (ref 65–99)
GLUCOSE-CAPILLARY: 145 mg/dL — AB (ref 65–99)
GLUCOSE-CAPILLARY: 154 mg/dL — AB (ref 65–99)
Glucose-Capillary: 97 mg/dL (ref 65–99)

## 2017-08-17 MED ORDER — FREE WATER
200.0000 mL | Freq: Four times a day (QID) | Status: DC
  Administered 2017-08-17 – 2017-08-25 (×29): 200 mL

## 2017-08-17 NOTE — Progress Notes (Signed)
Physical Therapy Session Note  Patient Details  Name: Noah RumpfJames Willits Jr. MRN: [id number] Date of Birth: [birth year]  Today's Date: 08/17/2017 PT Individual Time: 1315-1400 PT Individual Time Calculation (min): 45 min   Short Term Goals: Week 3:  PT Short Term Goal 1 (Week 3): Pt will perform rolling maxA +1 PT Short Term Goal 2 (Week 3): Pt will perform supine <>sit maxA +1 PT Short Term Goal 3 (Week 3): Pt will perform lateral scoot transfer maxA +2 PT Short Term Goal 4 (Week 3): Pt will perform dynamic sitting balance with min guard x10 min while engaged in functional task  Skilled Therapeutic Interventions/Progress Updates: Pt received seated in w/c with nursing present administering medications. Pt's friend arrived and requested to visit briefly with pt before pt taken out of room. Missed 15 min PT. Pt transported to gym total in w/c. Sit >stand with sara plus totalA +2; upon standing pt with LUE falling off L side of arm rest; returned dependently to chair. Pt with obvious odor of incontinent bowel movement. Returned to bed totalA with maximove. Rolling R/L totalA +2 while performing hygiene and changing brief, clothing. Remained supine in bed at end of session, NT alerted to need for fitted sheet on bed, alarm intact and all needs in reach.      Therapy Documentation Precautions:  Precautions Precautions: Fall Precaution Comments: severe left neglect Restrictions Weight Bearing Restrictions: No General: PT Amount of Missed Time (min): 15 Minutes PT Missed Treatment Reason: Other (Comment)(visitor) Pain: Pain Assessment Pain Assessment: No/denies pain  See Function Navigator for Current Functional Status.   Therapy/Group: Individual Therapy  Vista Lawmanlizabeth J Tygielski 08/17/2017, 2:31 PM

## 2017-08-17 NOTE — Progress Notes (Signed)
[facility]     PROGRESS NOTE  Subjective/Complaints:  No new issues. Resting comfortably in room   ROS: Patient denies fever, rash, sore throat, blurred vision, nausea, vomiting, diarrhea, cough, shortness of breath or chest pain, joint or back pain, headache, or mood change.   Objective: Vital Signs: Blood pressure (!) 161/74, pulse 66, temperature 98.4 F (36.9 C), temperature source Oral, resp. rate 18, height 5\' 11"  (1.803 m), weight 120.1 kg (264 lb 12.4 oz), SpO2 97 %. No results found. No results for input(s): WBC, HGB, HCT, PLT in the last 72 hours. No results for input(s): NA, K, CL, GLUCOSE, BUN, CREATININE, CALCIUM in the last 72 hours.  Invalid input(s): CO CBG (last 3)  Recent Labs    08/16/17 1653 08/16/17 2113 08/17/17 0635  GLUCAP 169* 112* 97    Wt Readings from Last 3 Encounters:  08/13/17 120.1 kg (264 lb 12.4 oz)  07/23/17 124.3 kg (274 lb)  06/29/17 124.3 kg (274 lb)    Physical Exam:  BP (!) 161/74 (BP Location: Left Arm)   Pulse 66   Temp 98.4 F (36.9 C) (Oral)   Resp 18   Ht 5\' 11"  (1.803 m)   Wt 120.1 kg (264 lb 12.4 oz)   SpO2 97%   BMI 36.93 kg/m  Constitutional: No distress . Vital signs reviewed. HEENT: EOMI, oral membranes moist Cardiovascular: RRR without murmur. No JVD    Respiratory: CTA Bilaterally without wheezes or rales. Normal effort    GI: BS +, non-tender, non-distended   Musculoskeletal: Right 3rd/4th fingers taped Neurological:  Fairly alert. Followed simple commands Left facial weakness with ongoing dysarthria Able to follow simple motor commands with max cues.  Motor: RUE 3+/5 proximal to distal with limitations at left hand RLE 3-/5 proximal to distal  LUE: 2+ to 3/5 proximal to distal LLE: 1/5 proximal to distal--- no change Skin: Skin is dry,warm   Psychiatric: Remains flat but cooperative  Assessment/Plan: 1. 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 Physiatrist is providing close team supervision and 24 hour management of active medical problems listed below. Physiatrist and rehab team continue to assess barriers to discharge/monitor patient progress toward functional and medical goals.  Function:  Bathing Bathing position   Position: Bed  Bathing parts Body parts bathed by patient: Abdomen, Chest Body parts bathed by helper: Right arm, Left arm, Front perineal area, Buttocks, Right upper leg, Left upper leg, Back, Left lower leg, Right lower leg, Chest, Abdomen  Bathing assist Assist Level: 2 helpers      Upper Body Dressing/Undressing Upper body dressing   What is the patient wearing?: Pull over shirt/dress       Pull over shirt/dress - Perfomed by helper: Thread/unthread right sleeve, Thread/unthread left sleeve, Put head through opening, Pull shirt over trunk        Upper body assist Assist Level: 2 helpers      Lower Body Dressing/Undressing Lower body dressing   What is the patient wearing?: Hospital Gown       Pants- Performed by helper: Thread/unthread right pants leg, Thread/unthread left pants leg, Pull pants up/down   Non-skid slipper socks- Performed by helper: Don/doff right sock, Don/doff left sock                  Lower body assist Assist for lower body  dressing: 2 Helpers      Financial trader activity did not occur: No continent bowel/bladder event   Toileting steps completed by helper: Adjust clothing prior to toileting, Performs perineal hygiene, Adjust clothing after toileting    Toileting assist Assist level: Two helpers   Transfers Chair/bed transfer   Chair/bed transfer method: Other Chair/bed transfer assist level: dependent (Pt equals 0%) Chair/bed transfer assistive device: Mechanical lift Mechanical lift:  Other(hoyer)   Locomotion Ambulation Ambulation activity did not occur: Safety/medical concerns   Max distance: 7 Assist level: 2 helpers   Wheelchair   Type: Manual   Assist Level: Dependent (Pt equals 0%)(dependently transported to gym)  Cognition Comprehension Comprehension assist level: Understands basic 25 - 49% of the time/ requires cueing 50 - 75% of the time  Expression Expression assist level: Expresses basic 25 - 49% of the time/requires cueing 50 - 75% of the time. Uses single words/gestures.  Social Interaction Social Interaction assist level: Interacts appropriately 25 - 49% of time - Needs frequent redirection.  Problem Solving Problem solving assist level: Solves basic less than 25% of the time - needs direction nearly all the time or does not effectively solve problems and may need a restraint for safety  Memory Memory assist level: Recognizes or recalls 25 - 49% of the time/requires cueing 50 - 75% of the time    Medical Problem List and Plan: 1.  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   Cont CIR--likely SNF   -recent head CT demonstrates expected evolution of infarct 2.  DVT Prophylaxis/Anticoagulation: start  Pharmaceutical: Lovenox 3. Pain Management: tylenol prn. Added Sportscreme to help with joint stiffness bilateral hands and left knee.    -Right hand middle finger pain, base of fracture nondisplaced     = splint, Ortho follow-up after hospitalization--- ongoing discomfort in right hand   -Right first MTP pain likely gout. xrays negative     4. Mood: Team to provide encouragement/ego support. LCSW to follow for evaluation when appropriate.  5. Alzheimer's dementia/ Neuropsych: This patient is not capable of making decisions on his own behalf. Continue Namenda    -continue methylphenidate  10mg  bid to optimize arousal and  initiation 6. Skin/Wound Care: Pressure relief measures. Maintain adequate nutritional and hydration status.  7. Fluids/Electrolytes/Nutrition: poor intake   - PEG placed 08/13/17.     -Tube feeds initiated with the assistance of nutrition mgt   -Free water flushes through PEG    -labs pending today 8. T2DM: Monitor BS ac/hs. Continue SSI.    -fair control 3/18, may need to consider oral agent CBG (last 3)  Recent Labs    08/16/17 1653 08/16/17 2113 08/17/17 0635  GLUCAP 169* 112* 97   9. HTN: Monitor BP bid-       Vitals:   08/16/17 1507 08/17/17 0309  BP: (!) 148/72 (!) 161/74  Pulse: 69 66  Resp: 20 18  Temp: 98.7 F (37.1 C) 98.4 F (36.9 C)  SpO2: 99% 97%    Metoprolol XL to 25 mg daily on 3/6 decreased to 12.5 on 3/10 to avoid hypoperfusion--no changes today  10. CKD stage III:  .    Cr  1.19      follow-uppending 11. Dyslipidemia: now on Lipitor.   12. Morbid obesity             Body mass index is 38.22 kg/m.  Diet and exercise education 13. Acute blood loss anemia   Hemoglobin 11.9  3/12   -Hemoglobin 11.8 14.  Bowel incontinence:  working on achieving continence 15. Bladder: incontinent  16. Cough: Improved   -Continue Robitussin scheduled--- changed to as needed   -Aspiration precautions     LOS (Days) 20 A FACE TO FACE EVALUATION WAS PERFORMED  Ranelle Oyster 08/17/2017 8:51 AM

## 2017-08-17 NOTE — Progress Notes (Signed)
Speech Language Pathology Daily Session Note  Patient Details  Name: Noah RumpfJames Mallin Jr. MRN: [id number] Date of Birth: 1941-02-28  Today's Date: 08/17/2017 SLP Individual Time: 0803-0900 SLP Individual Time Calculation (min): 57 min  Short Term Goals: Week 3: SLP Short Term Goal 1 (Week 3): Pt will sustain his attention to basic, familiar tasks for 10 minute with max verbal cues for redirection.  SLP Short Term Goal 2 (Week 3): Pt will utilize external aids to reorient to place, date, and situation in 50% of opportunties with max assist verbal cues.  SLP Short Term Goal 3 (Week 3): Pt will consume dys 1 textures and thin liquids with min cues for use of swallowing precautions and minimal overt s/s of aspiration. SLP Short Term Goal 4 (Week 3): Pt will demonstrate functional problem solving during ADL tasks in 50% of opportunies with max A multimodal cues.  Skilled Therapeutic Interventions:Skilled ST services focused on swallow and cognitive skills. SLP facilitated proper positioning for PO consumption in bed, given max A verbal cues to encourage PO consumption pt consumed 3/4 yogurt, 1/2 pancake, 2 orange juices and 1/2 water cup and demonstrated 1 delayed cough.  PT required total A for hand over hand assitance in self feeding. SLP informed nursing of PO intake with high sugar SLP provided oral care recurring max A verbal cues. SLP offered thin liquid via straw following oral care, pt demonstrated oral holding, requesting to spit out liquid given max A verbal cues to swallow and max A verbal cues/extra time to spit out liquid into cup, suggesting reduced inattention to bolus in oral cavity outside of patterned meal time. SLP facilitated recall of recent events and orientation, pt demonstrated recall of place ( mose cone), however unable to recall situation of time given max A verbal cues. Pt did not initially recall placement of PEG tube, however recalled later in session with Mod A question prompts.  Pt was left in room with call bell within reach. Recommend to continue skilled ST services.       Function:  Eating Eating   Modified Consistency Diet: Yes Eating Assist Level: Helper feeds patient;Hand over hand assist   Eating Set Up Assist For: Opening containers;Cutting food   Helper Brings Food to Mouth: Every scoop   Cognition Comprehension Comprehension assist level: Understands basic 25 - 49% of the time/ requires cueing 50 - 75% of the time  Expression   Expression assist level: Expresses basic 25 - 49% of the time/requires cueing 50 - 75% of the time. Uses single words/gestures.  Social Interaction Social Interaction assist level: Interacts appropriately 25 - 49% of time - Needs frequent redirection.  Problem Solving Problem solving assist level: Solves basic less than 25% of the time - needs direction nearly all the time or does not effectively solve problems and may need a restraint for safety  Memory Memory assist level: Recognizes or recalls 25 - 49% of the time/requires cueing 50 - 75% of the time    Pain Pain Assessment Pain Assessment: No/denies pain  Therapy/Group: Individual Therapy  Lennon Richins  [facility] 08/17/2017, 1:19 PM

## 2017-08-17 NOTE — Progress Notes (Signed)
Occupational Therapy Session Note  Patient Details  Name: Noah RumpfJames Dinse Jr. MRN: [id number] Date of Birth: [birth year]  Today's Date: 08/17/2017 OT Individual Time: 1000-1115 OT Individual Time Calculation (min): 75 min    Short Term Goals: Week 3:  OT Short Term Goal 1 (Week 3): Pt will roll to the R in bed with max A. OT Short Term Goal 2 (Week 3): Pt will roll to the L in bed with max A. OT Short Term Goal 3 (Week 3): Pt will demonstrate improved use of UE by washing face and chest with washcloth with mod VC. OT Short Term Goal 4 (Week 3): Pt will be able to sit upright at EOB with mod A.   Skilled Therapeutic Interventions/Progress Updates:    Pt resting in bed upon arrival and acknowledged staff when spoken to.  Pt does not attempt to avert his gaze to his left when approached from L.  Pt requires max verbal cues to rotate his head to L.  OT intervention with focus on bed mobility, washing face, and locating objects at midline and placed in his L hand.  Pt requires tot A + 2 for bed mobility and all dressing tasks.  Pt refused to wash his face this morning.  Pt transferred to w/c with maxi lift + 2.  Red cup placed in pt's L hand and pt unable to locate red cup or his L hand, even with max verbal cues.  Pt easily distracted, requiring max verbal cues to redirect. Pt required tot A for orientation to place but was able to state day of week.  Pt remained in TIS w/c with all needs within reach and QRB in place.   Therapy Documentation Precautions:  Precautions Precautions: Fall Precaution Comments: severe left neglect Restrictions Weight Bearing Restrictions: No Pain: Pain Assessment Pain Assessment: No/denies pain  See Function Navigator for Current Functional Status.   Therapy/Group: Individual Therapy  Rich BraveLanier, Iza Preston Chappell 08/17/2017, 12:06 PM

## 2017-08-18 ENCOUNTER — Ambulatory Visit (HOSPITAL_COMMUNITY): Payer: Medicare Other

## 2017-08-18 ENCOUNTER — Inpatient Hospital Stay (HOSPITAL_COMMUNITY): Payer: Medicare Other | Admitting: Physical Therapy

## 2017-08-18 ENCOUNTER — Inpatient Hospital Stay (HOSPITAL_COMMUNITY): Payer: Medicare Other

## 2017-08-18 LAB — CBC
HEMATOCRIT: 34.8 % — AB (ref 39.0–52.0)
HEMOGLOBIN: 11.4 g/dL — AB (ref 13.0–17.0)
MCH: 27 pg (ref 26.0–34.0)
MCHC: 32.8 g/dL (ref 30.0–36.0)
MCV: 82.5 fL (ref 78.0–100.0)
Platelets: 337 10*3/uL (ref 150–400)
RBC: 4.22 MIL/uL (ref 4.22–5.81)
RDW: 14.9 % (ref 11.5–15.5)
WBC: 9.9 10*3/uL (ref 4.0–10.5)

## 2017-08-18 LAB — GLUCOSE, CAPILLARY
GLUCOSE-CAPILLARY: 136 mg/dL — AB (ref 65–99)
Glucose-Capillary: 96 mg/dL (ref 65–99)
Glucose-Capillary: 173 mg/dL — ABNORMAL HIGH (ref 65–99)
Glucose-Capillary: 121 mg/dL — ABNORMAL HIGH (ref 65–99)

## 2017-08-18 NOTE — Progress Notes (Signed)
Occupational Therapy Note  Patient Details  Name: Noah RumpfJames Fomby Jr. MRN: [id number] Date of Birth: [birth year]  Today's Date: 08/18/2017 OT Individual Time: 1000-1030 OT Individual Time Calculation (min): 30 min   Pt with no s/s of pain Individual Therapy  OT intervention with focus on bed mobility, following one step commands, and attending to L.  Pt continues to requires tot A + 2 for all bed mobility and max verbal cues for following one step commands.  Pt tot A for orientation.  Pt remained in bed with all needs within reach and bed alarm activated    Rich BraveLanier, Noah Bush 08/18/2017, 12:23 PM

## 2017-08-18 NOTE — Progress Notes (Signed)
Speech Language Pathology Daily Session Note  Patient Details  Name: Noah Bush. MRN: [id number] Date of Birth: 06-16-1940  Today's Date: 08/18/2017 SLP Individual Time: 1324-4010 SLP Individual Time Calculation (min): 55 min  Short Term Goals: Week 3: SLP Short Term Goal 1 (Week 3): Pt will sustain his attention to basic, familiar tasks for 10 minute with max verbal cues for redirection.  SLP Short Term Goal 2 (Week 3): Pt will utilize external aids to reorient to place, date, and situation in 50% of opportunties with max assist verbal cues.  SLP Short Term Goal 3 (Week 3): Pt will consume dys 1 textures and thin liquids with min cues for use of swallowing precautions and minimal overt s/s of aspiration. SLP Short Term Goal 4 (Week 3): Pt will demonstrate functional problem solving during ADL tasks in 50% of opportunies with max A multimodal cues.  Skilled Therapeutic Interventions: Skilled ST services focused on swallow and cognitive skills. SLP facilitated PO intake of magic cup, pt consumed 1/4 cup with max A verbal cues for encouragement and 1 cup water via straw with no overt s/s aspiration requiring total A for feeding. Pt demonstrated recall of place, Mose cone, however unable to recall situation and time given max A verbal cues.  SLP facilitated oral care via suction tooth brush requiring total A. SLP facilitated basic body movements for 1 step directions pt demonstrated 40% accuracy with max A multimodal cues, however visual deficits/limited body movement function impacted mobility.  Pt demonstrated recall of PEG placement and reason for PEG placement with max A question cues. SLP facilitated verbal recognition and naming for common objects from [facility] toolkit, pt demonstrated 70% recognition of object by name and function, however was unable to decode at word level, stating " I can't see good." Pt attempted to communicate with daughter in-law, Morrie Sheldon, who was not present at the time, SLP  explained and pt stated understanding. Pt continues to demonstrate confusion and requiring total assistance I- basic ADLs. Pt was left in room with nursing outside door, door open and bed alarm set. Recommend to continue skilled ST services.      Function:  Eating Eating   Modified Consistency Diet: Yes Eating Assist Level: Helper feeds patient;Hand over hand assist   Eating Set Up Assist For: Opening containers;Cutting food   Helper Brings Food to Mouth: Every scoop   Cognition Comprehension Comprehension assist level: Understands basic 25 - 49% of the time/ requires cueing 50 - 75% of the time  Expression      Social Interaction Social Interaction assist level: Interacts appropriately 25 - 49% of time - Needs frequent redirection.  Problem Solving Problem solving assist level: Solves basic less than 25% of the time - needs direction nearly all the time or does not effectively solve problems and may need a restraint for safety  Memory Memory assist level: Recognizes or recalls 25 - 49% of the time/requires cueing 50 - 75% of the time    Pain Pain Assessment Pain Assessment: No/denies pain  Therapy/Group: Individual Therapy  Ajayla Iglesias  [facility] 08/18/2017, 8:56 AM

## 2017-08-18 NOTE — Patient Care Conference (Addendum)
Inpatient RehabilitationTeam Conference and Plan of Care Update Date: 08/18/2017   Time: 2:05 PM    Patient Name: Noah Bush.      Medical Record Number: [id number]  Date of Birth: 30-Apr-1941 Sex: Male         Room/Bed: 4W05C/4W05C-01 Payor Info: Payor: Multimedia programmer / Plan: [employer] MEDICARE / Product Type: *No Product type* /    Admitting Diagnosis: CVA  Admit Date/Time:  07/28/2017  6:29 PM Admission Comments: No comment available   Primary Diagnosis:  <principal problem not specified> Principal Problem: <principal problem not specified>  Patient Active Problem List   Diagnosis Date Noted  . Cough   . Hypotension due to drugs   . Finger pain, right   . Poor nutrition   . Acute blood loss anemia   . Benign essential HTN   . 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 Acute ischemic right MCA stroke (HCC) 07/28/2017  . Diabetes mellitus type 2 in obese (HCC)   . Stage 3 chronic kidney disease (HCC)   . Morbid obesity (HCC)   . Cerebral infarction (HCC) 07/27/2017  . Hyperlipidemia   . Essential hypertension   . 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 Asymmetry of cerebral ventricles, Acute, Acquired  07/23/2017  . Encounter for screening colonoscopy 03/12/2015    Expected Discharge Date: Expected Discharge Date: (SNF)  Team Members Present: Physician leading conference: Dr. Faith Rogue Social Worker Present: Staci Acosta, LCSW Nurse Present: Allayne Stack, RN PT Present: Alyson Reedy, PT;Caitlin Penven-Crew, PT OT Present: Ardis Rowan, COTA;Keyoni Lapinski Katrinka Blazing, OT SLP Present: Colin Benton, SLP PPS Coordinator present : Tora Duck, RN, CRRN     Current Status/Progress Goal Weekly Team Focus  Medical   peg placed. neurolgically without change. CT stable   see prior  nutrition and renal function   Bowel/Bladder    ncontinent most of the time of bladder/bowel , has had 2 continent episodes last night in his urinal after  requesting to use urinal, LBM 3/19/2019M   b/b with total assist   Assess and montor episode , B/B AQS & PRN,condo cath. discontinue, provide incontinent care and peri care prn   Swallow/Nutrition/ Hydration   Max A safe swallow, Total A feeding  Max A - downgraded  increase PO intake, safe swallow strategies   ADL's   tot A+2 for all self care, bed mobility, sitting balance, maxilift for transfers, max verbal cues for attention to L and visual scallning; max A for orientation  downgraded to mod/max A overall  activity tolerance, orientation, visual scanning, awareness, following one step commands   Mobility   totalA +2 bed mobility and transfers, totalA sit >stand in sara plus, limited by declining participation/initiation, frequent incontinent bowel movements  Goals downgraded to maxA overall, minA sitting balance  participation in therapy, bed mobility and transfers, L attention, initiation/attention   Communication             Safety/Cognition/ Behavioral Observations  Total-Max A  Max   sustained attention, recall of PEG/situation, Orientation, basic problem solving   Pain   Denies pain         Skin   s/p Peg placement site intact and healing well, MASD resolving  treat MASD with total assist, keep skin clean and dry as possible due to incontinence.  Assees and monitor     Rehab Goals Patient on target to meet rehab goals: No Rehab Goals Revised: pt's progress has been very slow, even with last  week's downgraded goals *See Care Plan and progress notes for long and short-term goals.     Barriers to Discharge  Current Status/Progress Possible Resolutions Date Resolved   Physician    Medical stability        SNF?      Nursing                  PT                    OT                  SLP                SW                Discharge Planning/Teaching Needs:  Pt to go to SNF for more therapy prior to family caring for him at home.  Some family education completed with son  and grandson, but needs are too great for caregiving at home.   Team Discussion:  Pt is doing better nutritionally with the PEG, but he is still consuming less than 50% of his meals.  Pt had some continent episodes last night with urinal, but has been incontinent again today.  RN can only skip 1 tube feeding per day, as pt is not consuming enough p.o.  Pt is still total A and OT and ST felt his cognition was better last week.  PT completed family education and showed grandson that pt is total assist, sometimes +2.  Pt's swallowing was better last week when cognition was better.  Revisions to Treatment Plan:  Pt's therapy schedule changed to qd.    Continued Need for Acute Rehabilitation Level of Care: The patient requires daily medical management by a physician with specialized training in physical medicine and rehabilitation for the following conditions: Daily direction of a multidisciplinary physical rehabilitation program to ensure safe treatment while eliciting the highest outcome that is of practical value to the patient.: Yes Daily medical management of patient stability for increased activity during participation in an intensive rehabilitation regime.: Yes Daily analysis of laboratory values and/or radiology reports with any subsequent need for medication adjustment of medical intervention for : Neurological problems;Nutritional problems  Noah Bush, Vista DeckJennifer Bush 08/18/2017, 2:19 PM

## 2017-08-18 NOTE — Progress Notes (Signed)
[place] PHYSICAL MEDICINE & REHABILITATION     PROGRESS NOTE  Subjective/Complaints:  Pt in bed. Comfortable. Denies pain this morning  ROS: Limited due to cognitive/behavioral    Objective: Vital Signs: Blood pressure (!) 151/71, pulse 69, temperature 97.8 F (36.6 C), temperature source Oral, resp. rate 18, height 5\' 11"  (1.803 m), weight 120.1 kg (264 lb 12.4 oz), SpO2 95 %. No results found. Recent Labs    08/18/17 0552  WBC 9.9  HGB 11.4*  HCT 34.8*  PLT 337   Recent Labs    08/17/17 1159  NA 140  K 4.1  CL 107  GLUCOSE 131*  BUN 31*  CREATININE 1.10  CALCIUM 9.5   CBG (last 3)  Recent Labs    08/17/17 1638 08/17/17 2051 08/18/17 0629  GLUCAP 145* 154* 96    Wt Readings from Last 3 Encounters:  08/13/17 120.1 kg (264 lb 12.4 oz)  07/23/17 124.3 kg (274 lb)  06/29/17 124.3 kg (274 lb)    Physical Exam:  BP (!) 151/71 (BP Location: Left Arm)   Pulse 69   Temp 97.8 F (36.6 C) (Oral)   Resp 18   Ht 5\' 11"  (1.803 m)   Wt 120.1 kg (264 lb 12.4 oz)   SpO2 95%   BMI 36.93 kg/m  Constitutional: No distress . Vital signs reviewed. HEENT: EOMI, oral membranes moist Cardiovascular: RRR without murmur. No JVD    Respiratory: CTA Bilaterally without wheezes or rales. Normal effort    GI: BS +, non-tender, non-distended    Musculoskeletal: Right 3rd/4th fingers taped Neurological:  Fairly alert. Followed simple commands Left facial weakness with ongoing dysarthria Able to follow simple motor commands with max cues.  Motor: RUE 3+/5 proximal to distal with limitations at left hand RLE 3-/5 proximal to distal  LUE: 2+ to 3/5 proximal to distal LLE: 1/5 proximal to distal--- no change Skin: Skin is dry,warm   Psychiatric: Remains flat but cooperative  Assessment/Plan: 1. 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 Physiatrist is providing close team supervision and 24 hour management of active medical problems listed below. Physiatrist and rehab team continue to assess barriers to discharge/monitor patient progress toward functional and medical goals.  Function:  Bathing Bathing position   Position: Bed  Bathing parts Body parts bathed by patient: Abdomen, Chest Body parts bathed by helper: Right arm, Left arm, Front perineal area, Buttocks, Right upper leg, Left upper leg, Back, Left lower leg, Right lower leg, Chest, Abdomen  Bathing assist Assist Level: 2 helpers      Upper Body Dressing/Undressing Upper body dressing   What is the patient wearing?: Pull over shirt/dress       Pull over shirt/dress - Perfomed by helper: Thread/unthread right sleeve, Thread/unthread left sleeve, Put head through opening, Pull shirt over trunk        Upper body assist Assist Level: 2 helpers      Lower Body Dressing/Undressing Lower body dressing   What is the patient wearing?: Hospital Gown       Pants- Performed by helper: Thread/unthread right pants leg, Thread/unthread left pants leg, Pull pants up/down   Non-skid slipper socks- Performed by helper: Don/doff right sock, Don/doff left sock                  Lower body assist Assist for lower body dressing: 2  Helpers      Financial traderToileting Toileting Toileting activity did not occur: No continent bowel/bladder event   Toileting steps completed by helper: Adjust clothing prior to toileting, Performs perineal hygiene, Adjust clothing after toileting    Toileting assist Assist level: Two helpers   Transfers Chair/bed transfer   Chair/bed transfer method: Other Chair/bed transfer assist level: dependent (Pt equals 0%) Chair/bed transfer assistive device: Mechanical lift Mechanical lift: Other(hoyer)   Locomotion Ambulation Ambulation activity did not occur: Safety/medical concerns   Max distance:  7 Assist level: 2 helpers   Wheelchair   Type: Manual   Assist Level: Dependent (Pt equals 0%)(dependently transported to gym)  Cognition Comprehension Comprehension assist level: Understands basic 25 - 49% of the time/ requires cueing 50 - 75% of the time  Expression Expression assist level: Expresses basic 25 - 49% of the time/requires cueing 50 - 75% of the time. Uses single words/gestures.  Social Interaction Social Interaction assist level: Interacts appropriately 25 - 49% of time - Needs frequent redirection.  Problem Solving Problem solving assist level: Solves basic less than 25% of the time - needs direction nearly all the time or does not effectively solve problems and may need a restraint for safety  Memory Memory assist level: Recognizes or recalls 25 - 49% of the time/requires cueing 50 - 75% of the time    Medical Problem List and Plan: 1.  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   Cont CIR--likely SNF---team conference today   -recent head CT demonstrates expected evolution of infarct 2.  DVT Prophylaxis/Anticoagulation: start  Pharmaceutical: Lovenox 3. Pain Management: tylenol prn. Added Sportscreme to help with joint stiffness bilateral hands and left knee.    -Right hand middle finger pain, base of fracture nondisplaced     = splint, Ortho follow-up after hospitalization--- ongoing discomfort in right hand   -Right first MTP pain likely gout. xrays negative     4. Mood: Team to provide encouragement/ego support. LCSW to follow for evaluation when appropriate.  5. Alzheimer's dementia/ Neuropsych: This patient is not capable of making decisions on his own behalf. Continue Namenda    -continue methylphenidate  10mg  bid to optimize arousal and initiation 6. Skin/Wound Care: Pressure relief measures. Maintain adequate nutritional and  hydration status.  7. Fluids/Electrolytes/Nutrition: poor intake   - PEG placed 08/13/17.     -Tube feeds initiated   -Free water flushes through PEG    -yesterday's labs with increased BUN, h20 flushes increased to qid 8. T2DM: Monitor BS ac/hs. Continue SSI.    -fair control 3/18, may need to consider oral agent CBG (last 3)  Recent Labs    08/17/17 1638 08/17/17 2051 08/18/17 0629  GLUCAP 145* 154* 96   9. HTN: Monitor BP bid-       Vitals:   08/17/17 2200 08/18/17 0211  BP: 122/62 (!) 151/71  Pulse: 77 69  Resp:  18  Temp:  97.8 F (36.6 C)  SpO2: 99% 95%    Metoprolol XL to 25 mg daily on 3/6 decreased to 12.5 on 3/10 to avoid hypoperfusion--stable  10. CKD stage III:  .    Cr  1.19      follow-uppending 11. Dyslipidemia: now on Lipitor.   12. Morbid obesity             Body mass index is 38.22 kg/m.  Diet and exercise education 13. Acute blood loss anemia   Hemoglobin 11.9  3/12   -Hemoglobin 11.8 14.  Bowel incontinence:  working on achieving continence 15. Bladder: incontinent  16. Cough: Improved   -Continue Robitussin scheduled--- changed to as needed   -Aspiration precautions     LOS (Days) 21 A FACE TO FACE EVALUATION WAS PERFORMED  Ranelle Oyster 08/18/2017 9:15 AM

## 2017-08-18 NOTE — Progress Notes (Signed)
Social Work Patient ID: Noah RumpfJames Gemmill Jr., male   DOB: 1941-05-14, 77 y.o.   MRN: [id number]   CSW spoke with pt's son, Noah Bush, after conference to update him and to see what family wants to do with pt's d/c plan.  Family feels pt needs more rehab prior to coming home, but he is concerned that pt will not get the intensity of therapy that he is getting here, as they are familiar with SNF stay pt had before.  CSW explained he is right, but pt is not able to tolerate 3 hours/day now anyway and that we can look at other facilities.  He was appreciative and wants CSW to f/u with him and his mother.  CSW will continue to follow and assist with SNF transfer.

## 2017-08-18 NOTE — Progress Notes (Signed)
Physical Therapy Session Note  Patient Details  Name: Noah RumpfJames Macqueen Jr. MRN: [id number] Date of Birth: 09-09-1940  Today's Date: 08/18/2017 PT Individual Time: 1300-1330 PT Individual Time Calculation (min): 30 min   Short Term Goals: Week 3:  PT Short Term Goal 1 (Week 3): Pt will perform rolling maxA +1 PT Short Term Goal 2 (Week 3): Pt will perform supine <>sit maxA +1 PT Short Term Goal 3 (Week 3): Pt will perform lateral scoot transfer maxA +2 PT Short Term Goal 4 (Week 3): Pt will perform dynamic sitting balance with min guard x10 min while engaged in functional task  Skilled Therapeutic Interventions/Progress Updates: Pt received asleep in bed, awoken with auditory stimulation however unable to remain awake >10-15 seconds at a time when not repeatedly cued. Rolling R/L dependently to change brief and pants after incontinent bladder accident. Transferred to w/c totalA in maximove. Pt engaging with therapist only when LEs or abdomen in discomfort during rolling and donning pants despite max multimodal cueing. Remained in w/c at nurses station, quick release belt intact and staff present.      Therapy Documentation Precautions:  Precautions Precautions: Fall Precaution Comments: severe left neglect Restrictions Weight Bearing Restrictions: No   See Function Navigator for Current Functional Status.   Therapy/Group: Individual Therapy  Vista Lawmanlizabeth J Tygielski 08/18/2017, 1:45 PM

## 2017-08-18 NOTE — Progress Notes (Signed)
Physical Therapy Note  Patient Details  Name: Noah RumpfJames Watt Jr. MRN: [id number] Date of Birth: [birth year] Today's Date: 08/18/2017    Pt's plan of care adjusted to QD after speaking with care team and discussed with PA as pt currently unable to tolerate current therapy schedule with OT, PT, and SLP.     Carolynn Commentlizabeth J Tygielski 08/18/2017, 7:38 AM

## 2017-08-19 ENCOUNTER — Inpatient Hospital Stay (HOSPITAL_COMMUNITY): Payer: Medicare Other | Admitting: *Deleted

## 2017-08-19 ENCOUNTER — Ambulatory Visit (HOSPITAL_COMMUNITY): Payer: Medicare Other | Admitting: Speech Pathology

## 2017-08-19 ENCOUNTER — Inpatient Hospital Stay (HOSPITAL_COMMUNITY): Payer: Medicare Other | Admitting: Physical Therapy

## 2017-08-19 LAB — GLUCOSE, CAPILLARY
GLUCOSE-CAPILLARY: 189 mg/dL — AB (ref 65–99)
GLUCOSE-CAPILLARY: 160 mg/dL — AB (ref 65–99)
GLUCOSE-CAPILLARY: 198 mg/dL — AB (ref 65–99)

## 2017-08-19 NOTE — Progress Notes (Signed)
Occupational Therapy Note  Patient Details  Name: Bobby RumpfJames Pressnell Jr. MRN: [id number] Date of Birth: 01-03-1941  Today's Date: 08/19/2017 OT Individual Time: 1030-1100 OT Individual Time Calculation (min): 30 min   Pt with no s/s of pain Individual Therapy  OT intervention with focus on bed mobility, following one step commands, and orientation.  Pt requires tot A for orientation, tot A+2 for all bed mobility, and max multimodal cues to initiate tasks.  Pt incontinent of bladder and required tot A + 2 hygiene and changing pants.  Pt remained in bed with all needs within reach.    Lavone NeriLanier, Eliora Nienhuis [facility] 08/19/2017, 12:19 PM

## 2017-08-19 NOTE — Progress Notes (Signed)
Speech Language Pathology Daily Session Note  Patient Details  Name: Noah RumpfJames Rathke Jr. MRN: [id number] Date of Birth: 10-Jul-1940  Today's Date: 08/19/2017 SLP Individual Time: 0730-0800 SLP Individual Time Calculation (min): 30 min  Short Term Goals: Week 3: SLP Short Term Goal 1 (Week 3): Pt will sustain his attention to basic, familiar tasks for 10 minute with max verbal cues for redirection.  SLP Short Term Goal 2 (Week 3): Pt will utilize external aids to reorient to place, date, and situation in 50% of opportunties with max assist verbal cues.  SLP Short Term Goal 3 (Week 3): Pt will consume dys 1 textures and thin liquids with min cues for use of swallowing precautions and minimal overt s/s of aspiration. SLP Short Term Goal 4 (Week 3): Pt will demonstrate functional problem solving during ADL tasks in 50% of opportunies with max A multimodal cues.  Skilled Therapeutic Interventions: Skilled treatment session focused on dysphagia and cognition goals. SLP facilitated session by providing Total A to Max A cues for sustained attention to SLP's presence in room. SLP facilitated session by feeding pt as pt is not able to feed himself despite Total A hand over hand. Pt consumed dysphagia 1 with thin liquids without overt s/s of aspiration. Pt refused trials of dysphagia 2. Pt continues to only be oriented to [facility] Pt with delusional comments and no success in redirecting. Pt was left upright in bed with all needs within reach.      Function:  Eating Eating   Modified Consistency Diet: Yes Eating Assist Level: Helper feeds patient       Helper Brings Food to Mouth: Every scoop   Cognition Comprehension Comprehension assist level: Understands basic 25 - 49% of the time/ requires cueing 50 - 75% of the time  Expression   Expression assist level: Expresses basic 25 - 49% of the time/requires cueing 50 - 75% of the time. Uses single words/gestures.  Social Interaction Social  Interaction assist level: Interacts appropriately 25 - 49% of time - Needs frequent redirection.  Problem Solving Problem solving assist level: Solves basic less than 25% of the time - needs direction nearly all the time or does not effectively solve problems and may need a restraint for safety  Memory Memory assist level: Recognizes or recalls less than 25% of the time/requires cueing greater than 75% of the time    Pain Pain Assessment Pain Assessment: No/denies pain Pain Score: 0-No pain  Therapy/Group: Individual Therapy  Beyza Bellino 08/19/2017, 12:09 PM

## 2017-08-19 NOTE — Progress Notes (Signed)
[place] PHYSICAL MEDICINE & REHABILITATION     PROGRESS NOTE  Subjective/Complaints:  Pt sitting up in bed. Very alert. Asked him he was ready for therapy and he said "I am"  ROS: Limited due to cognitive/behavioral    Objective: Vital Signs: Blood pressure 135/65, pulse 80, temperature 98.2 F (36.8 C), temperature source Oral, resp. rate 20, height 5\' 11"  (1.803 m), weight 120.1 kg (264 lb 12.4 oz), SpO2 98 %. No results found. Recent Labs    08/18/17 0552  WBC 9.9  HGB 11.4*  HCT 34.8*  PLT 337   Recent Labs    08/17/17 1159  NA 140  K 4.1  CL 107  GLUCOSE 131*  BUN 31*  CREATININE 1.10  CALCIUM 9.5   CBG (last 3)  Recent Labs    08/18/17 1636 08/18/17 2142 08/19/17 1140  GLUCAP 121* 136* 189*    Wt Readings from Last 3 Encounters:  08/13/17 120.1 kg (264 lb 12.4 oz)  07/23/17 124.3 kg (274 lb)  06/29/17 124.3 kg (274 lb)    Physical Exam:  BP 135/65 (BP Location: Left Arm)   Pulse 80   Temp 98.2 F (36.8 C) (Oral)   Resp 20   Ht 5\' 11"  (1.803 m)   Wt 120.1 kg (264 lb 12.4 oz)   SpO2 98%   BMI 36.93 kg/m  Constitutional: No distress . Vital signs reviewed. HEENT: EOMI, oral membranes moist Cardiovascular: RRR without murmur. No JVD    Respiratory: CTA Bilaterally without wheezes or rales. Normal effort    GI: BS +, non-tender, non-distended    Musculoskeletal: Right 3rd finger with ktape Neurological:  Alert, makes good eye contact Left facial weakness with ongoing dysarthria Able to follow simple motor commands with max cues.  Motor: RUE 3+/5 proximal to distal with limitations at left hand RLE 3-/5 proximal to distal  LUE: 2+ to 3/5 proximal to distal LLE: 1/5 proximal to distal--- no change Skin: Skin is dry,warm   Psychiatric:flat but pleasant   Assessment/Plan: 1. 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 Physiatrist is providing close team supervision and 24 hour management of active medical problems listed below. Physiatrist and rehab team continue to assess barriers to discharge/monitor patient progress toward functional and medical goals.  Function:  Bathing Bathing position   Position: Bed  Bathing parts Body parts bathed by patient: Abdomen, Chest Body parts bathed by helper: Right arm, Left arm, Chest, Abdomen, Front perineal area, Buttocks, Right upper leg, Left upper leg, Right lower leg, Left lower leg, Back  Bathing assist Assist Level: 2 helpers      Upper Body Dressing/Undressing Upper body dressing   What is the patient wearing?: Hospital gown       Pull over shirt/dress - Perfomed by helper: Thread/unthread right sleeve, Thread/unthread left sleeve, Put head through opening, Pull shirt over trunk        Upper body assist Assist Level: 2 helpers      Lower Body Dressing/Undressing Lower body dressing   What is the patient wearing?: Hospital Gown       Pants- Performed by helper: Thread/unthread right pants leg, Thread/unthread left pants leg, Pull pants up/down   Non-skid slipper socks- Performed by helper: Don/doff right sock, Don/doff left sock                  Lower  body assist Assist for lower body dressing: 2 Helpers      Toileting Toileting Toileting activity did not occur: No continent bowel/bladder event   Toileting steps completed by helper: Adjust clothing prior to toileting, Performs perineal hygiene, Adjust clothing after toileting    Toileting assist Assist level: Two helpers   Transfers Chair/bed transfer   Chair/bed transfer method: Other Chair/bed transfer assist level: dependent (Pt equals 0%) Chair/bed transfer assistive device: Mechanical lift Mechanical lift: Other(hoyer)   Locomotion Ambulation Ambulation activity did not occur: Safety/medical concerns   Max distance: 7 Assist  level: 2 helpers   Wheelchair   Type: Manual   Assist Level: Dependent (Pt equals 0%)(dependently transported to gym)  Cognition Comprehension Comprehension assist level: Understands basic less than 25% of the time/ requires cueing >75% of the time  Expression Expression assist level: Expresses basis less than 25% of the time/requires cueing >75% of the time.  Social Interaction Social Interaction assist level: Interacts appropriately less than 25% of the time. May be withdrawn or combative.  Problem Solving Problem solving assist level: Solves basic less than 25% of the time - needs direction nearly all the time or does not effectively solve problems and may need a restraint for safety  Memory Memory assist level: Recognizes or recalls less than 25% of the time/requires cueing greater than 75% of the time    Medical Problem List and Plan: 1.  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   Cont CIR--now SNF   -recent head CT demonstrates expected evolution of infarct 2.  DVT Prophylaxis/Anticoagulation: start  Pharmaceutical: Lovenox 3. Pain Management: tylenol prn. Added Sportscreme to help with joint stiffness bilateral hands and left knee.    -Right hand middle finger pain, base of fracture nondisplaced     = splint, Ortho follow-up after hospitalization--- ongoing discomfort in right hand   -Right first MTP pain likely gout. xrays negative     4. Mood: Team to provide encouragement/ego support. LCSW to follow for evaluation when appropriate.  5. Alzheimer's dementia/ Neuropsych: This patient is not capable of making decisions on his own behalf. Continue Namenda    -continue methylphenidate  10mg  bid to optimize arousal and initiation---? benefit 6. Skin/Wound Care: Pressure relief measures. Maintain adequate nutritional and hydration status.  7.  Fluids/Electrolytes/Nutrition: poor intake   - PEG placed 08/13/17.     -Tube feeds initiated   -Free water flushes through PEG    -  labs with increased BUN, h20 flushes increased to qid---recheck tomorrow 8. T2DM: Monitor BS ac/hs. Continue SSI.    -fair control 3/18, may need to consider oral agent CBG (last 3)  Recent Labs    08/18/17 1636 08/18/17 2142 08/19/17 1140  GLUCAP 121* 136* 189*   9. HTN: Monitor BP bid-       Vitals:   08/18/17 1425 08/19/17 0500  BP: 129/86 135/65  Pulse: 76 80  Resp: 20 20  Temp: 98 F (36.7 C) 98.2 F (36.8 C)  SpO2: 99% 98%    Metoprolol XL to 25 mg daily on 3/6 decreased to 12.5 on 3/10 to avoid hypoperfusion--stable at present  10. CKD stage III:  .    Cr  1.19      follow-uppending 11. Dyslipidemia: now on Lipitor.   12. Morbid obesity             Body mass index is  38.22 kg/m.             Diet and exercise education 13. Acute blood loss anemia   Hemoglobin 11.9  3/12   -Hemoglobin 11.8 14.  Bowel incontinence:  working on achieving continence 15. Bladder: incontinent  16. Cough: Improved   -Continue Robitussin scheduled--- changed to as needed   -Aspiration precautions     LOS (Days) 22 A FACE TO FACE EVALUATION WAS PERFORMED  Ranelle Oyster 08/19/2017 3:41 PM

## 2017-08-19 NOTE — Progress Notes (Signed)
Physical Therapy Session Note  Patient Details  Name: Noah RumpfJames Hammen Jr. MRN: [id number] Date of Birth: 04/09/1941  Today's Date: 08/19/2017 PT Individual Time: 1300-1330 PT Individual Time Calculation (min): 30 min   Short Term Goals: Week 3:  PT Short Term Goal 1 (Week 3): Pt will perform rolling maxA +1 PT Short Term Goal 2 (Week 3): Pt will perform supine <>sit maxA +1 PT Short Term Goal 3 (Week 3): Pt will perform lateral scoot transfer maxA +2 PT Short Term Goal 4 (Week 3): Pt will perform dynamic sitting balance with min guard x10 min while engaged in functional task  Skilled Therapeutic Interventions/Progress Updates:    Pt supine in bed upon therapist arrival, pt is agreeable to participate in therapy session. Pt reports no pain this PM. Attempt to have pt turn head to the left and find objects in left visual field. Pt gets his head to midline and then is unable to turn further to find objects. Pt has incontinence of bowel and urine in his brief. Rolling R/L with total assist x 2 with max cues to use UE to pull on bedrail to assist with turning. Pt is unable to follow cues to assist with rolling, is dependent for pericare and brief change. Attempt to have patient engage in bed level therex. Pt is able to perform some exercises on RLE with AAROM, is unable to initiate therex on LLE, PROM performed. Pt left supine in bed with needs in reach and bed alarm in place.  Therapy Documentation Precautions:  Precautions Precautions: Fall Precaution Comments: severe left neglect Restrictions Weight Bearing Restrictions: No  See Function Navigator for Current Functional Status.   Therapy/Group: Individual Therapy   Peter Congoaylor Aleana Fifita, PT, DPT  08/19/2017, 3:31 PM

## 2017-08-20 ENCOUNTER — Inpatient Hospital Stay (HOSPITAL_COMMUNITY): Payer: Medicare Other | Admitting: Physical Therapy

## 2017-08-20 ENCOUNTER — Inpatient Hospital Stay (HOSPITAL_COMMUNITY): Payer: Medicare Other

## 2017-08-20 ENCOUNTER — Ambulatory Visit (HOSPITAL_COMMUNITY): Payer: Medicare Other | Admitting: Speech Pathology

## 2017-08-20 LAB — BASIC METABOLIC PANEL
Anion gap: 13 (ref 5–15)
BUN: 31 mg/dL — ABNORMAL HIGH (ref 6–20)
CHLORIDE: 104 mmol/L (ref 101–111)
CO2: 19 mmol/L — AB (ref 22–32)
CREATININE: 1.04 mg/dL (ref 0.61–1.24)
Calcium: 9.3 mg/dL (ref 8.9–10.3)
GFR calc Af Amer: >60 mL/min (ref >60)
GFR calc non Af Amer: >60 mL/min (ref >60)
Glucose, Bld: 107 mg/dL — ABNORMAL HIGH (ref 65–99)
POTASSIUM: 4.8 mmol/L (ref 3.5–5.1)
Sodium: 136 mmol/L (ref 135–145)

## 2017-08-20 LAB — GLUCOSE, CAPILLARY
GLUCOSE-CAPILLARY: 100 mg/dL — AB (ref 65–99)
Glucose-Capillary: 149 mg/dL — ABNORMAL HIGH (ref 65–99)
Glucose-Capillary: 65 mg/dL (ref 65–99)
Glucose-Capillary: 77 mg/dL (ref 65–99)

## 2017-08-20 MED ORDER — GLIMEPIRIDE 2 MG PO TABS
1.0000 mg | ORAL_TABLET | Freq: Every day | ORAL | Status: DC
  Administered 2017-08-20 – 2017-08-21 (×2): 1 mg via ORAL
  Filled 2017-08-20 (×2): qty 1

## 2017-08-20 NOTE — Progress Notes (Signed)
Speech Language Pathology Daily Session Note  Patient Details  Name: Noah RumpfJames Shambley Jr. MRN: [id number] Date of Birth: 1941-05-29  Today's Date: 08/20/2017 SLP Individual Time: [id number] SLP Individual Time Calculation (min): 15 min  Short Term Goals: Week 3: SLP Short Term Goal 1 (Week 3): Pt will sustain his attention to basic, familiar tasks for 10 minute with max verbal cues for redirection.  SLP Short Term Goal 2 (Week 3): Pt will utilize external aids to reorient to place, date, and situation in 50% of opportunties with max assist verbal cues.  SLP Short Term Goal 3 (Week 3): Pt will consume dys 1 textures and thin liquids with min cues for use of swallowing precautions and minimal overt s/s of aspiration. SLP Short Term Goal 4 (Week 3): Pt will demonstrate functional problem solving during ADL tasks in 50% of opportunies with max A multimodal cues.  Skilled Therapeutic Interventions: Skilled treatment session focused on dysphagia goals. SLP facilitated session by providing skilled observation of pt consuming dysphagia 1 breakfast tray with thin liquids. SLP offered pt trials of graham crackers, peaches, and fruit cup. Pt declined. SLP provided Total hand over hand assistance to feed pt. Pt with no overt s/s of aspiration with puree and thin liquids. Pt continues to lack cognitive insight into consuming more PO intake. Pt was left upright in bed, bed alarm on and all needs within reach. Continue per current plan of care.      Function:  Eating Eating   Modified Consistency Diet: Yes Eating Assist Level: Helper feeds patient       Helper Brings Food to Mouth: Every scoop   Cognition Comprehension Comprehension assist level: Understands basic less than 25% of the time/ requires cueing >75% of the time  Expression   Expression assist level: Expresses basis less than 25% of the time/requires cueing >75% of the time.  Social Interaction Social Interaction assist level: Interacts  appropriately less than 25% of the time. May be withdrawn or combative.  Problem Solving Problem solving assist level: Solves basic less than 25% of the time - needs direction nearly all the time or does not effectively solve problems and may need a restraint for safety  Memory Memory assist level: Recognizes or recalls less than 25% of the time/requires cueing greater than 75% of the time    Pain    Therapy/Group: Individual Therapy  Noah Bush 08/20/2017, 9:45 AM

## 2017-08-20 NOTE — Plan of Care (Signed)
  Problem: Consults Goal: RH STROKE PATIENT EDUCATION Description See Patient Education module for education specifics  Outcome: Not Progressing  Pt cognitive deficit, not able to comprehend. Will continue to educate

## 2017-08-20 NOTE — Progress Notes (Signed)
Nutrition Follow-up  DOCUMENTATION CODES:   Obesity unspecified  INTERVENTION:  Let pt attempt to eat food at meal, if po intake for that meal is <50%, provide a bolus feed via PEG using Osmolite 1.5 formula at volume of 280 ml up to three times daily.   Additionally provide bolus feed of 280 ml at HS daily.   Provide 30 ml Prostat TID per tube.   Continue free water flushes of 200 ml QID per tube.   If all boluses are given, tube feeding to provide 1980 kcal (97% of kcal needs), 115 grams of protein (100% of protein needs), 1651 ml of water.   NUTRITION DIAGNOSIS:   Inadequate oral intake related to lethargy/confusion as evidenced by meal completion < 25%; ongoing  GOAL:   Patient will meet greater than or equal to 90% of their needs; met via TF  MONITOR:   PO intake, Diet advancement, Weight trends, Labs, TF tolerance, Skin, I & O's  REASON FOR ASSESSMENT:   Consult Calorie Count  ASSESSMENT:   77 y.o. RH- male  with history of HTN, Alzheimer dementia, CKD III, and T2DM who was admitted on 07/23/17 from MD office with reports of fall, difficulty walking, drooling, right gaze preference and facial droop. 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 Cognitive evaluation revealed severe cognitive communication impairment question baseline but able to follow basic commands with left visual neglect. He continues to have issues with lethargy requiring tactile cues for activity, has significant left oral motor weakness--started on dysphagia 1, thin liquids and severe left neglect with pusher tendency.    Pt continues on dysphagia 1 diet with thin liquids. Meal completion has been 0-35%. Pt is receiving bolus tube feed at all meals as intake has been <50%. Pt additionally given bolus feed at HS daily. Prostat ordered to aid in adequate protein  needs. Free water flushes ordered as po intake poor. RD to continue with current orders. RD to continue to monitor.   Diet Order:  DIET - DYS 1 Room service appropriate? Yes; Fluid consistency: Thin  EDUCATION NEEDS:   Not appropriate for education at this time  Skin:  Skin Assessment: Skin Integrity Issues: Skin Integrity Issues:: Other (Comment) Other: non pressure wound to buttocks  Last BM:  3/21  Height:   Ht Readings from Last 1 Encounters:  07/28/17 5' 11"  (1.803 m)    Weight:   Wt Readings from Last 1 Encounters:  08/13/17 264 lb 12.4 oz (120.1 kg)    Ideal Body Weight:  78 kg  BMI:  Body mass index is 36.93 kg/m.  Estimated Nutritional Needs:   Kcal:  2050-2250  Protein:  100-115 grams  Fluid:  2- 2.2 L/day    Corrin Parker, MS, RD, LDN Pager # [phone] After hours/ weekend pager # [phone]

## 2017-08-20 NOTE — Progress Notes (Signed)
[facility] PHYSICAL MEDICINE & REHABILITATION     PROGRESS NOTE  Subjective/Complaints:  No new complaints. Up in bed, just waking up  ROS: Limited due to cognitive/behavioral    Objective: Vital Signs: Blood pressure (!) 141/76, pulse 70, temperature 98.4 F (36.9 C), temperature source Oral, resp. rate 18, height 5\' 11"  (1.803 m), weight 120.1 kg (264 lb 12.4 oz), SpO2 100 %. No results found. Recent Labs    08/18/17 0552  WBC 9.9  HGB 11.4*  HCT 34.8*  PLT 337   Recent Labs    08/17/17 1159 08/20/17 0601  NA 140 136  K 4.1 4.8  CL 107 104  GLUCOSE 131* 107*  BUN 31* 31*  CREATININE 1.10 1.04  CALCIUM 9.5 9.3   CBG (last 3)  Recent Labs    08/19/17 1716 08/19/17 2107 08/20/17 0652  GLUCAP 160* 198* 100*    Wt Readings from Last 3 Encounters:  08/13/17 120.1 kg (264 lb 12.4 oz)  07/23/17 124.3 kg (274 lb)  06/29/17 124.3 kg (274 lb)    Physical Exam:  BP (!) 141/76 (BP Location: Right Arm)   Pulse 70   Temp 98.4 F (36.9 C) (Oral)   Resp 18   Ht 5\' 11"  (1.803 m)   Wt 120.1 kg (264 lb 12.4 oz)   SpO2 100%   BMI 36.93 kg/m  Constitutional: No distress . Vital signs reviewed. HEENT: EOMI, oral membranes moist Cardiovascular: RRR without murmur. No JVD    Respiratory: CTA Bilaterally without wheezes or rales. Normal effort    GI: BS +, non-tender, non-distended   Musculoskeletal: Right 3rd finger with ktape in place Neurological:  Alert Left facial weakness with ongoing dysarthria Follows basic commands with verbal cueing  Motor: RUE 3+/5 proximal to distal with limitations at left hand RLE 3-/5 proximal to distal  LUE: 2+ to 3/5 proximal to distal LLE: 1/5 proximal to distal--- no changes in motor/sensory exam Skin: Skin is dry,warm   Psychiatric:flat but pleasant   Assessment/Plan: 1. 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 Physiatrist is providing close team supervision and 24 hour management of active medical problems listed below. Physiatrist and rehab team continue to assess barriers to discharge/monitor patient progress toward functional and medical goals.  Function:  Bathing Bathing position   Position: Bed  Bathing parts Body parts bathed by patient: Abdomen, Chest Body parts bathed by helper: Right arm, Left arm, Chest, Abdomen, Front perineal area, Buttocks, Right upper leg, Left upper leg, Right lower leg, Left lower leg, Back  Bathing assist Assist Level: 2 helpers      Upper Body Dressing/Undressing Upper body dressing   What is the patient wearing?: Hospital gown       Pull over shirt/dress - Perfomed by helper: Thread/unthread right sleeve, Thread/unthread left sleeve, Put head through opening, Pull shirt over trunk        Upper body assist Assist Level: 2 helpers      Lower Body Dressing/Undressing Lower body dressing   What is the patient wearing?: Hospital Gown       Pants- Performed by helper: Thread/unthread right pants leg, Thread/unthread left pants leg, Pull pants up/down   Non-skid slipper socks- Performed by helper: Don/doff right sock, Don/doff left sock                  Lower body assist  Assist for lower body dressing: 2 Helpers      Financial traderToileting Toileting Toileting activity did not occur: No continent bowel/bladder event   Toileting steps completed by helper: Adjust clothing prior to toileting, Performs perineal hygiene, Adjust clothing after toileting    Toileting assist Assist level: Two helpers   Transfers Chair/bed transfer   Chair/bed transfer method: Other Chair/bed transfer assist level: dependent (Pt equals 0%) Chair/bed transfer assistive device: Mechanical lift Mechanical lift: Other(hoyer)   Locomotion Ambulation Ambulation activity did not occur: Safety/medical concerns   Max distance:  7 Assist level: 2 helpers   Wheelchair   Type: Manual   Assist Level: Dependent (Pt equals 0%)(dependently transported to gym)  Cognition Comprehension Comprehension assist level: Understands basic less than 25% of the time/ requires cueing >75% of the time  Expression Expression assist level: Expresses basis less than 25% of the time/requires cueing >75% of the time.  Social Interaction Social Interaction assist level: Interacts appropriately less than 25% of the time. May be withdrawn or combative.  Problem Solving Problem solving assist level: Solves basic less than 25% of the time - needs direction nearly all the time or does not effectively solve problems and may need a restraint for safety  Memory Memory assist level: Recognizes or recalls less than 25% of the time/requires cueing greater than 75% of the time    Medical Problem List and Plan: 1.  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   Cont CIR--now SNF pending   -recent head CT demonstrates expected evolution of infarct 2.  DVT Prophylaxis/Anticoagulation: start  Pharmaceutical: Lovenox 3. Pain Management: tylenol prn. Added Sportscreme to help with joint stiffness bilateral hands and left knee.    -Right hand middle finger pain, base of fracture nondisplaced     = splint, Ortho follow-up after hospitalization--- ongoing discomfort in right hand   -Right first MTP pain likely gout. xrays negative     4. Mood: Team to provide encouragement/ego support. LCSW to follow for evaluation when appropriate.  5. Alzheimer's dementia/ Neuropsych: This patient is not capable of making decisions on his own behalf. Continue Namenda    -continue methylphenidate  10mg  bid to optimize arousal and initiation---? benefit 6. Skin/Wound Care: Pressure relief measures. Maintain adequate nutritional and hydration  status.  7. Fluids/Electrolytes/Nutrition: poor intake   - PEG placed 08/13/17.     -Tube feeds initiated   -Free water flushes through PEG increased to QID    -BUN/Cr holding at 31/1.04---no changes at present 8. T2DM: Monitor BS ac/hs. Continue SSI.    -cbg's increased on TF   -begin low dose amaryl 1mg  daily CBG (last 3)  Recent Labs    08/19/17 1716 08/19/17 2107 08/20/17 0652  GLUCAP 160* 198* 100*   9. HTN: Monitor BP bid-       Vitals:   08/19/17 1500 08/20/17 0047  BP: (!) 137/59 (!) 141/76  Pulse: 74 70  Resp: 19 18  Temp: 98.3 F (36.8 C) 98.4 F (36.9 C)  SpO2: 96% 100%    Metoprolol XL to 25 mg daily on 3/6 decreased to 12.5 on 3/10 to avoid hypoperfusion--stable at present  10. CKD stage III:  .    Cr  1.04 11. Dyslipidemia: now on Lipitor.   12. Morbid obesity             Body mass index is 38.22 kg/m.  Diet and exercise education 13. Acute blood loss anemia   Hemoglobin 11.9  3/12   -Hemoglobin 11.8 14.  Bowel incontinence:  working on achieving continence 15. Bladder: incontinent  16. Cough: Improved   -Continue Robitussin scheduled--- changed to as needed   -Aspiration precautions     LOS (Days) 23 A FACE TO FACE EVALUATION WAS PERFORMED  Ranelle Oyster 08/20/2017 8:23 AM

## 2017-08-20 NOTE — Progress Notes (Signed)
Physical Therapy Weekly Progress Note  Patient Details  Name: Noah Bush. MRN: [id number] Date of Birth: June 12, 1940  Beginning of progress report period: August 13, 2017 End of progress report period: August 20, 2017  Today's Date: 08/20/2017 PT Individual Time: 1400-1430 PT Individual Time Calculation (min): 30 min   Patient has met 1 of 4 short term goals.  Pt has demonstrated no progress towards functional goals in the past week; pt participates minimally in therapy sessions. Currently requires totalA +2 for bed mobility, transfers performed dependently in maximove bed <>tilt in space w/c. Continues to demonstrate severe L inattention with R gaze preference. Poor initiation and attention throughout all therapy sessions, pt interacts minimally and only with maximal repetitive cueing. Pt frequently incontinent of bowel and bladder, limiting productivity of therapy sessions. Discharge plan has been changed to SNF after completing initial family education.   Patient continues to demonstrate the following deficits muscle weakness and muscle joint tightness, decreased cardiorespiratoy endurance, impaired timing and sequencing, unbalanced muscle activation, motor apraxia, decreased coordination and decreased motor planning, decreased visual perceptual skills and decreased visual motor skills, decreased midline orientation, decreased attention to left, left side neglect, decreased motor planning and ideational apraxia, decreased initiation, decreased attention, decreased awareness, decreased problem solving, decreased safety awareness, decreased memory and delayed processing and decreased sitting balance, decreased standing balance, decreased postural control, hemiplegia and decreased balance strategies and therefore will continue to benefit from skilled PT intervention to increase functional independence with mobility.  Patient not progressing toward long term goals.  See goal revision..  Plan of care  revisions: goals already downgraded to maxA overall.  PT Short Term Goals Week 3:  PT Short Term Goal 1 (Week 3): Pt will perform rolling maxA +1 PT Short Term Goal 1 - Progress (Week 3): Not met PT Short Term Goal 2 (Week 3): Pt will perform supine <>sit maxA +1 PT Short Term Goal 2 - Progress (Week 3): Not met PT Short Term Goal 3 (Week 3): Pt will perform lateral scoot transfer maxA +2 PT Short Term Goal 3 - Progress (Week 3): Not met PT Short Term Goal 4 (Week 3): Pt will perform dynamic sitting balance with min guard x10 min while engaged in functional task PT Short Term Goal 4 - Progress (Week 3): Met Week 4:  PT Short Term Goal 1 (Week 4): =LTG downgraded to maxA pending SNF placement  Skilled Therapeutic Interventions/Progress Updates: Pt received asleep in bed, awakens to therapist's voice and responds but does not demonstrate any L visual scanning to locate therapist. Pt perseverative on "getting that truck to a guy" and "dropping the keys off for him". Rolling R/L totalA +2 to change brief after incontinent bowel/bladder accident. Patient initiates rolling from sidelying>supine however requires modA to complete. Verbal/tactile cues to initiate reaching and assistance with rolling. Transfer bed>w/c totalA in maximove. Remained seated in w/c at end of session at nurses station, quick release belt intact and all needs in reach.      Therapy Documentation Precautions:  Precautions Precautions: Fall Precaution Comments: severe left neglect Restrictions Weight Bearing Restrictions: No   See Function Navigator for Current Functional Status.  Therapy/Group: Individual Therapy  Luberta Mutter 08/20/2017, 7:42 AM

## 2017-08-20 NOTE — Progress Notes (Signed)
Occupational Therapy Weekly Progress Note  Patient Details  Name: Noah Bush. MRN: [id number] Date of Birth: Oct 09, 1940  Beginning of progress report period: August 12, 2017 End of progress report period: August 20, 2017  Patient has met 2 of 3 short term goals. Pt progress this past week has been minimal and LTG have been downgraded.  Pt discharge plan has changed to SNF for continued rehab before returning home with family.  Pt requires max/tot A for orientation and attending to objects/persons on his L and is only able to rotate his head to midline.  Pt requires max A for visual scanning to locate objects in his R visual field.  Pt requires tot A + 2 for bed mobility and transfers are currently with mechanical lift (maxi lift).  Patient continues to demonstrate the following deficits: muscle weakness, decreased cardiorespiratoy endurance, impaired timing and sequencing, abnormal tone, unbalanced muscle activation, motor apraxia, decreased coordination and decreased motor planning, decreased visual acuity, decreased visual perceptual skills and decreased visual motor skills, decreased midline orientation and decreased attention to left, decreased initiation, decreased attention, decreased awareness, decreased problem solving, decreased safety awareness, decreased memory and delayed processing and decreased sitting balance, decreased standing balance, decreased postural control, hemiplegia, decreased balance strategies and difficulty maintaining precautions and therefore will continue to benefit from skilled OT intervention to enhance overall performance with BADL and Reduce care partner burden.  Patient progressing toward long term goals..  Continue plan of care.  OT Short Term Goals Week 3:  OT Short Term Goal 1 (Week 3): Pt will roll to the R in bed with max A. OT Short Term Goal 2 (Week 3): Pt will roll to the L in bed with max A. OT Short Term Goal 2 - Progress (Week 3): Progressing toward  goal OT Short Term Goal 3 (Week 3): Pt will demonstrate improved use of UE by washing face and chest with washcloth with mod VC. OT Short Term Goal 3 - Progress (Week 3): Met OT Short Term Goal 4 (Week 3): Pt will be able to sit upright at EOB with mod A.  OT Short Term Goal 4 - Progress (Week 3): Met Week 4:  OT Short Term Goal 1 (Week 4): STG=LTG secondary to ELOS   Therapy Documentation Precautions:  Precautions Precautions: Fall Precaution Comments: severe left neglect Restrictions Weight Bearing Restrictions: No    See Function Navigator for Current Functional Status.     Leotis Shames [facility] 08/20/2017, 6:52 AM

## 2017-08-20 NOTE — [place] FL2 (Signed)
[place] MEDICAID FL2 LEVEL OF CARE SCREENING TOOL     IDENTIFICATION  Patient Name: Noah RumpfJames Turi Jr. Birthdate: [birth year] Sex: male Admission Date (Current Location): 07/28/2017  [facility] [place]   Facility and Address:  [facility], [address], [place], [place] [address]      Provider Number: [id number]  Attending Physician Name and Address:  Ranelle OysterSwartz, Zachary T, MD  Relative Name and Phone Number:       Current Level of Care: Other (Comment)(Comprehensive Inpatient Rehabilitation) Recommended Level of Care: Skilled Nursing Facility Prior Approval Number:    Date Approved/Denied:   PASRR Number: [id number]  Discharge Plan: SNF    Current Diagnoses: Patient Active Problem List   Diagnosis Date Noted  . Cough   . Hypotension due to drugs   . Finger pain, right   . Poor nutrition   . Acute blood loss anemia   . Benign essential HTN   . 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 Acute ischemic right MCA stroke (HCC) 07/28/2017  . Diabetes mellitus type 2 in obese (HCC)   . Stage 3 chronic kidney disease (HCC)   . Morbid obesity (HCC)   . Cerebral infarction (HCC) 07/27/2017  . Hyperlipidemia   . Essential hypertension   . 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 Asymmetry of cerebral ventricles, Acute, Acquired  07/23/2017  . Encounter for screening colonoscopy 03/12/2015    Orientation RESPIRATION BLADDER Height & Weight     Self, Situation, Place  Normal Incontinent Weight: 120.1 kg (264 lb 12.4 oz) Height:  5\' 11"  (180.3 cm)  BEHAVIORAL SYMPTOMS/MOOD NEUROLOGICAL BOWEL NUTRITION STATUS      Incontinent Diet, Feeding tube(D1, thin liquids; improved swallowing; feeds via tube if pt is consuming less than 50% of meal - RN has been able to skip one tube feeding per day)  AMBULATORY STATUS COMMUNICATION OF NEEDS Skin   Total Care Verbally Other (Comment)(PEG placement site intact and  healing well; MASD resolving)                       Personal Care Assistance Level of Assistance  Bathing Bathing Assistance: Maximum assistance         Functional Limitations Info  Sight(L inattention) Sight Info: Impaired(L inattention)        SPECIAL CARE FACTORS FREQUENCY  Blood pressure, PT (By licensed PT), OT (By licensed OT), Bowel and bladder program, Restorative feeding program, Speech therapy Blood Pressure Frequency: daily   PT Frequency: 5x/week OT Frequency: 5x/week Bowel and Bladder Program Frequency: daily Restorative Feeding Program Frequency: encourage p.o. feeding and less PEG feeds and advance diet Speech Therapy Frequency: 5x/week      Contractures Contractures Info: Not present    Additional Factors Info  Psychotropic Code Status Info: full Allergies Info: no known allergies Psychotropic Info: Ritalin for increased attention and initiation Insulin Sliding Scale Info: 0-9 units 3x/day       Current Medications (08/20/2017):  This is the current hospital active medication list Current Facility-Administered Medications  Medication Dose Route Frequency Provider Last Rate Last Dose  . acetaminophen (TYLENOL) tablet 325-650 mg  325-650 mg Oral Q4H PRN Jacquelynn CreeLove, Pamela S, PA-C   650 mg at 08/06/17 2000  . albuterol (PROVENTIL) (2.5 MG/3ML) 0.083% nebulizer solution 2.5 mg  2.5 mg Nebulization Q6H PRN Love, Pamela S, PA-C      . alum & mag hydroxide-simeth (MAALOX/MYLANTA) 200-200-20 MG/5ML suspension 30  mL  30 mL Oral Q4H PRN Love, Pamela S, PA-C      . aspirin EC tablet 81 mg  81 mg Oral Daily Jacquelynn Cree, PA-C   81 mg at 08/20/17 4098  . atorvastatin (LIPITOR) tablet 80 mg  80 mg Oral q1800 Jacquelynn Cree, PA-C   80 mg at 08/19/17 1730  . bisacodyl (DULCOLAX) suppository 10 mg  10 mg Rectal Daily PRN Jacquelynn Cree, PA-C   10 mg at 07/30/17 1191  . clopidogrel (PLAVIX) tablet 75 mg  75 mg Oral Daily Jacquelynn Cree, PA-C   75 mg at 08/20/17 4782  .  diphenhydrAMINE (BENADRYL) 12.5 MG/5ML elixir 12.5-25 mg  12.5-25 mg Oral Q6H PRN Love, Pamela S, PA-C      . feeding supplement (OSMOLITE 1.5 CAL) liquid 280 mL  280 mL Per Tube TID PC & HS Ranelle Oyster, MD   280 mL at 08/20/17 0835  . feeding supplement (PRO-STAT SUGAR FREE 64) liquid 30 mL  30 mL Per Tube TID BM Ranelle Oyster, MD   30 mL at 08/20/17 1354  . free water 200 mL  200 mL Per Tube QID Ranelle Oyster, MD   200 mL at 08/20/17 1349  . glimepiride (AMARYL) tablet 1 mg  1 mg Oral Q breakfast Ranelle Oyster, MD   1 mg at 08/20/17 1017  . insulin aspart (novoLOG) injection 0-9 Units  0-9 Units Subcutaneous TID WC Jacquelynn Cree, PA-C   1 Units at 08/20/17 1209  . latanoprost (XALATAN) 0.005 % ophthalmic solution 1 drop  1 drop Left Eye QHS Jacquelynn Cree, PA-C   1 drop at 08/19/17 1926  . memantine (NAMENDA) tablet 5 mg  5 mg Oral Daily Jacquelynn Cree, PA-C   5 mg at 08/20/17 0834  . methylphenidate (RITALIN) tablet 10 mg  10 mg Oral BID WC Ranelle Oyster, MD   10 mg at 08/20/17 1208  . metoprolol succinate (TOPROL-XL) 24 hr tablet 12.5 mg  12.5 mg Oral Daily Marcello Fennel, MD   12.5 mg at 08/20/17 9562  . multivitamin (RENA-VIT) tablet 1 tablet  1 tablet Oral QHS Jacquelynn Cree, PA-C   1 tablet at 08/19/17 1927  . MUSCLE RUB CREA   Topical QID Love, Pamela S, PA-C      . polyethylene glycol (MIRALAX / GLYCOLAX) packet 17 g  17 g Oral BID Erick Colace, MD   17 g at 08/19/17 1925  . prochlorperazine (COMPAZINE) tablet 5-10 mg  5-10 mg Oral Q6H PRN Jacquelynn Cree, PA-C   5 mg at 07/30/17 1308   Or  . prochlorperazine (COMPAZINE) injection 5-10 mg  5-10 mg Intramuscular Q6H PRN Love, Pamela S, PA-C       Or  . prochlorperazine (COMPAZINE) suppository 12.5 mg  12.5 mg Rectal Q6H PRN Love, Pamela S, PA-C      . sodium phosphate (FLEET) 7-19 GM/118ML enema 1 enema  1 enema Rectal Once PRN Love, Pamela S, PA-C      . traZODone (DESYREL) tablet 25-50 mg  25-50 mg Oral  QHS PRN Jacquelynn Cree, PA-C         Discharge Medications: Please see discharge summary for a list of discharge medications.  Relevant Imaging Results:  Relevant Lab Results:   Additional Information SSN: [id number]  Nijel Flink, [place], LCSW

## 2017-08-20 NOTE — Progress Notes (Signed)
Occupational Therapy Note  Patient Details  Name: Noah RumpfJames Mccormick Jr. MRN: [id number] Date of Birth: 09/30/1940  Today's Date: 08/20/2017 OT Individual Time: 1130-1200 OT Individual Time Calculation (min): 30 min   Pt with no s/s of pain Individual Therapy  OT intervention with focus on following one step commands, bed mobility, and orientation.  Pt continues to require tot A + 2 for bed mobility and dressing tasks.  Pt unable to avert eyes to L past midline or rotate head past midline.  Pt required tot A for orientation.  Pt did not initiate any transitional movements or rolling in bed when requested.  Pt remained in bed with all needs within reach.    Noah Bush, [facility] 08/20/2017, 12:18 PM

## 2017-08-21 ENCOUNTER — Inpatient Hospital Stay (HOSPITAL_COMMUNITY): Payer: Medicare Other

## 2017-08-21 ENCOUNTER — Inpatient Hospital Stay (HOSPITAL_COMMUNITY): Payer: Medicare Other | Admitting: Speech Pathology

## 2017-08-21 ENCOUNTER — Inpatient Hospital Stay (HOSPITAL_COMMUNITY): Payer: Medicare Other | Admitting: Physical Therapy

## 2017-08-21 LAB — GLUCOSE, CAPILLARY
GLUCOSE-CAPILLARY: 55 mg/dL — AB (ref 65–99)
GLUCOSE-CAPILLARY: 72 mg/dL (ref 65–99)
GLUCOSE-CAPILLARY: 49 mg/dL — AB (ref 65–99)
GLUCOSE-CAPILLARY: 67 mg/dL (ref 65–99)
GLUCOSE-CAPILLARY: 75 mg/dL (ref 65–99)
Glucose-Capillary: 44 mg/dL — CL (ref 65–99)
Glucose-Capillary: 64 mg/dL — ABNORMAL LOW (ref 65–99)
Glucose-Capillary: 62 mg/dL — ABNORMAL LOW (ref 65–99)
Glucose-Capillary: 59 mg/dL — ABNORMAL LOW (ref 65–99)

## 2017-08-21 MED ORDER — METHYLPHENIDATE HCL 5 MG PO TABS
5.0000 mg | ORAL_TABLET | Freq: Two times a day (BID) | ORAL | Status: DC
  Administered 2017-08-21 – 2017-08-25 (×8): 5 mg via ORAL
  Filled 2017-08-21 (×8): qty 1

## 2017-08-21 NOTE — Plan of Care (Signed)
  Problem: RH SAFETY Goal: RH STG DECREASED RISK OF FALL WITH ASSISTANCE Description STG Decreased Risk of Fall With  Mod Assistance.  Outcome: Progressing  Maintain safety call light at hand, yellow socks, bed alarm

## 2017-08-21 NOTE — Progress Notes (Signed)
Notified Pam Love, PA of pt's CBG. Administered 480cc orange juice, 60cc cola, and vanilla pudding patient tolerated well. A/O, WNL. Continue to assess.

## 2017-08-21 NOTE — Progress Notes (Signed)
Speech Language Pathology Weekly Progress and Session Note  Patient Details  Name: Noah Bush. MRN: [id number] Date of Birth: 13-Jun-1940  Beginning of progress report period: August 12, 2017 End of progress report period: August 21, 2017  Today's Date: 08/21/2017 SLP Individual Time: 0086-7619 SLP Individual Time Calculation (min): 15 min  Short Term Goals: Week 3: SLP Short Term Goal 1 (Week 3): Pt will sustain his attention to basic, familiar tasks for 10 minute with max verbal cues for redirection.  SLP Short Term Goal 1 - Progress (Week 3): Not met SLP Short Term Goal 2 (Week 3): Pt will utilize external aids to reorient to place, date, and situation in 50% of opportunties with max assist verbal cues.  SLP Short Term Goal 2 - Progress (Week 3): Not met SLP Short Term Goal 3 (Week 3): Pt will consume dys 1 textures and thin liquids with min cues for use of swallowing precautions and minimal overt s/s of aspiration. SLP Short Term Goal 3 - Progress (Week 3): Met SLP Short Term Goal 4 (Week 3): Pt will demonstrate functional problem solving during ADL tasks in 50% of opportunies with max A multimodal cues. SLP Short Term Goal 4 - Progress (Week 3): Not met    New Short Term Goals: Week 4: SLP Short Term Goal 1 (Week 4): Pt will sustain his attention to basic, familiar tasks for 2 minutes with Total  multimodal cues for redirection.  SLP Short Term Goal 2 (Week 4): Pt will utilize external aids to reorient to place, date, and situation in 50% of opportunties with Total assist verbal cues.  SLP Short Term Goal 3 (Week 4): Pt will demonstrate functional problem solving during ADL tasks in 25% of opportunies with Total A multimodal cues.  Weekly Progress Updates: Pt will minimal progress this reporting and recently had PEG placed d/t cognitive inability to consume enough PO intake to maintain nutrition. Pt continues with significant confusion/delusions and cognitive impairments impacting  sustained attention, basic problem solving, task initiation and overall awareness. Due to these impairments, pt is safest on dysphagia 1 with thin liquids. Pt is currently Total A for all activities. Therefore recommend SNF follow-up at discharge from CIR.      Intensity: Minumum of 1-2 x/day, 30 to 90 minutes Frequency: 3 to 5 out of 7 days Duration/Length of Stay: 21-28 days  Treatment/Interventions: Cognitive remediation/compensation;Cueing hierarchy;Dysphagia/aspiration precaution training;Functional tasks;Patient/family education;Internal/external aids;Environmental controls   Daily Session  Skilled Therapeutic Interventions: Skilled treatment session focused on cognition goals. SLP facilitated session by providing Total A for orientation to breakfast. Pt states that he isn't hungry because he just "ate cake and peanuts." Despite Total A multimodal cues, pt was unable to be redirected and refused consumption of breakfast. Pt was left upright in bed with bed alarm on and all needs within reach. Continue to target attention and orientation to basic tasks.      Function:   Eating Eating   Modified Consistency Diet: Yes Eating Assist Level: Helper feeds patient       Helper Brings Food to Mouth: Every scoop   Cognition Comprehension Comprehension assist level: Understands basic less than 25% of the time/ requires cueing >75% of the time  Expression   Expression assist level: Expresses basis less than 25% of the time/requires cueing >75% of the time.  Social Interaction Social Interaction assist level: Interacts appropriately less than 25% of the time. May be withdrawn or combative.  Problem Solving Problem solving assist level: Solves basic  less than 25% of the time - needs direction nearly all the time or does not effectively solve problems and may need a restraint for safety  Memory Memory assist level: Recognizes or recalls less than 25% of the time/requires cueing greater than 75%  of the time   General    Pain    Therapy/Group: Individual Therapy  Shaylin Blatt 08/21/2017, 8:13 AM

## 2017-08-21 NOTE — Progress Notes (Signed)
[facility] PHYSICAL MEDICINE & REHABILITATION     PROGRESS NOTE  Subjective/Complaints:  Up in bed. Nurse is feeding/giving meds. No new problems  ROS: Patient denies fever, rash, sore throat, blurred vision, nausea, vomiting, diarrhea, cough, shortness of breath or chest pain, joint or back pain, headache, or mood change.   Objective: Vital Signs: Blood pressure (!) 115/101, pulse 76, temperature 98.3 F (36.8 C), temperature source Oral, resp. rate 18, height 5\' 11"  (1.803 m), weight 118.6 kg (261 lb 7.5 oz), SpO2 100 %. No results found. No results for input(s): WBC, HGB, HCT, PLT in the last 72 hours. Recent Labs    08/20/17 0601  NA 136  K 4.8  CL 104  GLUCOSE 107*  BUN 31*  CREATININE 1.04  CALCIUM 9.3   CBG (last 3)  Recent Labs    08/20/17 2044 08/21/17 0620 08/21/17 0645  GLUCAP 77 55* 72    Wt Readings from Last 3 Encounters:  08/20/17 118.6 kg (261 lb 7.5 oz)  07/23/17 124.3 kg (274 lb)  06/29/17 124.3 kg (274 lb)    Physical Exam:  BP (!) 115/101   Pulse 76   Temp 98.3 F (36.8 C) (Oral)   Resp 18   Ht 5\' 11"  (1.803 m)   Wt 118.6 kg (261 lb 7.5 oz)   SpO2 100%   BMI 36.47 kg/m  Constitutional: No distress . Vital signs reviewed. HEENT: EOMI, oral membranes moist Cardiovascular: RRR without murmur. No JVD    Respiratory: CTA Bilaterally without wheezes or rales. Normal effort    GI: BS +, non-tender, non-distended. PEG site clean and dry   Musculoskeletal: Right 3rd finger with ktape in place Neurological:  Alert Left facial weakness with ongoing dysarthria Follows basic commands with verbal cueing. Poor initiation Motor: RUE 3+/5 proximal to distal with limitations at left hand RLE 3-/5 proximal to distal  LUE: 2+ to 3/5 proximal to distal LLE: 1/5 proximal to distal--- no changes in motor/sensory exam Skin: Skin is dry,warm   Psychiatric:  flat  Assessment/Plan: 1. 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 Physiatrist is providing close team supervision and 24 hour management of active medical problems listed below. Physiatrist and rehab team continue to assess barriers to discharge/monitor patient progress toward functional and medical goals.  Function:  Bathing Bathing position   Position: Bed  Bathing parts Body parts bathed by patient: Abdomen, Chest Body parts bathed by helper: Right arm, Left arm, Chest, Abdomen, Front perineal area, Buttocks, Right upper leg, Left upper leg, Right lower leg, Left lower leg, Back  Bathing assist Assist Level: 2 helpers      Upper Body Dressing/Undressing Upper body dressing   What is the patient wearing?: Hospital gown       Pull over shirt/dress - Perfomed by helper: Thread/unthread right sleeve, Thread/unthread left sleeve        Upper body assist Assist Level: 2 helpers      Lower Body Dressing/Undressing Lower body dressing   What is the patient wearing?: Hospital Gown       Pants- Performed by helper: Thread/unthread right pants leg, Thread/unthread left pants leg, Pull pants up/down   Non-skid slipper socks- Performed by helper: Don/doff right sock, Don/doff left sock                  Lower body assist Assist for  lower body dressing: 2 Designer, multimedia activity did not occur: No continent bowel/bladder event   Toileting steps completed by helper: Adjust clothing prior to toileting, Performs perineal hygiene, Adjust clothing after toileting    Toileting assist Assist level: Two helpers   Transfers Chair/bed transfer   Chair/bed transfer method: Other Chair/bed transfer assist level: dependent (Pt equals 0%) Chair/bed transfer assistive device: Mechanical lift Mechanical lift: Other(hoyer)   Locomotion Ambulation Ambulation activity did not occur:  Safety/medical concerns   Max distance: 7 Assist level: 2 helpers   Wheelchair   Type: Manual   Assist Level: Dependent (Pt equals 0%)(dependently transported to gym)  Cognition Comprehension Comprehension assist level: Understands basic less than 25% of the time/ requires cueing >75% of the time  Expression Expression assist level: Expresses basis less than 25% of the time/requires cueing >75% of the time.  Social Interaction Social Interaction assist level: Interacts appropriately less than 25% of the time. May be withdrawn or combative.  Problem Solving Problem solving assist level: Solves basic less than 25% of the time - needs direction nearly all the time or does not effectively solve problems and may need a restraint for safety  Memory Memory assist level: Recognizes or recalls less than 25% of the time/requires cueing greater than 75% of the time    Medical Problem List and Plan: 1.  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   Cont CIR--SNF pending   -recent head CT demonstrates expected evolution of infarct 2.  DVT Prophylaxis/Anticoagulation: start  Pharmaceutical: Lovenox 3. Pain Management: tylenol prn. Added Sportscreme to help with joint stiffness bilateral hands and left knee.    -Right hand middle finger pain, base of fracture nondisplaced     = splint, Ortho follow-up after hospitalization--- ongoing discomfort in right hand   -Right first MTP pain likely gout. xrays negative     4. Mood: Team to provide encouragement/ego support. LCSW to follow for evaluation when appropriate.  5. Alzheimer's dementia/ Neuropsych: This patient is not capable of making decisions on his own behalf. Continue Namenda    -continue methylphenidate  10mg  bid to optimize arousal and initiation---? benefit 6. Skin/Wound Care: Pressure relief measures. Maintain  adequate nutritional and hydration status.  7. Fluids/Electrolytes/Nutrition: poor intake   - PEG placed 08/13/17.     -Tolerating TF   -Free water flushes through PEG increased to QID    -BUN/Cr holding at 31/1.04---no changes at present---recheck Monday 8. T2DM: Monitor BS ac/hs. Continue SSI.    -cbg's increased on TF   -began low dose amaryl 1mg  daily and sugars bottomed out---will d/c and observe today CBG (last 3)  Recent Labs    08/20/17 2044 08/21/17 0620 08/21/17 0645  GLUCAP 77 55* 72   9. HTN: Monitor BP bid-       Vitals:   08/21/17 0250 08/21/17 0824  BP: (!) 161/65 (!) 115/101  Pulse: 78 76  Resp: 18   Temp: 98.3 F (36.8 C)   SpO2: 100%     Metoprolol XL to 25 mg daily on 3/6 decreased to 12.5 on 3/10 to avoid hypoperfusion--BP's had been well controlled but with noticeable increase this morning. Re-check/follow up today. If persistently elevated, then consider increase back to 25mg  10. CKD stage III:  .    Cr  1.04 11. Dyslipidemia: now on Lipitor.   12. Morbid  obesity             Body mass index is 38.22 kg/m.             Diet and exercise education 13. Acute blood loss anemia   Hemoglobin 11.9  3/12   -Hemoglobin 11.8 14.  Bowel incontinence:  Continue to address with nursing team 15. Bladder: incontinent  16. Cough: Improved   -Continue Robitussin scheduled--- changed to as needed   -Aspiration precautions     LOS (Days) 24 A FACE TO FACE EVALUATION WAS PERFORMED  Ranelle Oyster 08/21/2017 9:13 AM

## 2017-08-21 NOTE — Significant Event (Signed)
Hypoglycemic Event  CBG: 55  Treatment: 15 GM carbohydrate snack  Symptoms: None  Follow-up CBG: RUEA:5409Time:0645 CBG Result:72  Possible Reasons for Event: Unknown  Comments/MD notified:    Gelene MinkSusan J Syair Fricker

## 2017-08-21 NOTE — Progress Notes (Signed)
Occupational Therapy Note  Patient Details  Name: Noah RumpfJames Goeller Jr. MRN: [id number] Date of Birth: 04-11-1941  Today's Date: 08/21/2017 OT Individual Time: 0930-1000 OT Individual Time Calculation (min): 30 min   Pt with no s/s of pain Individual Therapy  OT intervention with focus on attention to L, following one step commands, bed mobility, and activity tolerance.  Pt required tot A for orientation.  Pt required tot A for rolling in bed and tot A to initiate all transitional movements.  Pt remained in bed with all needs within reach.     Lavone NeriLanier, [facility] 08/21/2017, 3:25 PM

## 2017-08-21 NOTE — Discharge Summary (Addendum)
Physician Discharge Summary  Patient ID: Noah Bush. MRN: [id number] DOB/AGE: 06-15-1940 77 y.o.  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 Recent large right MCA territory infarct. EXAM: CT HEAD WITHOUT CONTRAST TECHNIQUE: Contiguous axial images were obtained from the base of the skull through the vertex without intravenous contrast. COMPARISON:  MRI brain 07/24/2017 FINDINGS: Brain: Expected evolution of the large right MCA territory infarct is evident. There is now volume loss. Previously-seen midline shift has resolved. Partial effacement of the right lateral ventricle has partially resolved. Cortical hyperdensity reflects petechial hemorrhages previously noted. Some of this hyperdensity may represent cortical laminar necrosis. No new infarct or hemorrhage is present. There are still some mass effect on the atrium of the right lateral ventricle. No significant extra-axial fluid collection is present. Vascular: Atherosclerotic calcifications are present in the cavernous internal carotid arteries bilaterally. There is no hyperdense vessel. Skull: Calvarium is intact. No focal lytic or blastic lesions are present. Sinuses/Orbits: Maxillary sinus wall thickening is present bilaterally without significant coastal disease. The paranasal sinuses are otherwise clear. The mastoid air cells are clear. Scleral banding is noted at the  right globe. Globes and orbits are otherwise normal. IMPRESSION: 1. Expected evolution of large right MCA territory infarct. 2. Stable areas of hyperdensity compatible with petechial hemorrhage. 3. No new hemorrhage or significant expansion of the infarct. 4. Decreasing mass effect. Electronically Signed   By: Marin Roberts M.D.   On: 08/12/2017 10:29   Ir Gastrostomy Tube Mod Sed  Result Date: 08/13/2017 INDICATION: 77 year old male with a history of stroke, dysphagia EXAM: PERC PLACEMENT GASTROSTOMY MEDICATIONS: 2.0 g Ancef; Antibiotics were administered within 1 hour of the procedure. ANESTHESIA/SEDATION: Versed 1.0 mg IV; Fentanyl 50 mcg IV Moderate Sedation Time:  10 minutes The patient was continuously monitored during the procedure by the interventional radiology nurse under my direct supervision. CONTRAST:  10 cc-administered into the gastric lumen. FLUOROSCOPY TIME:  Fluoroscopy Time: 0 minutes 54 seconds (9.0 mGy). COMPLICATIONS: None immediate. PROCEDURE: Informed written consent was obtained from the patient and the patient's family after a thorough discussion of the procedural risks, benefits and alternatives. All questions were addressed. Maximal Sterile Barrier Technique was utilized including caps, mask, sterile gowns, sterile gloves, sterile drape, hand hygiene and skin antiseptic. A timeout was performed prior to the initiation of the procedure. The epigastrium was prepped with Betadine in a sterile fashion, and a sterile drape was applied covering the operative field. A sterile gown and sterile gloves were used for the procedure. A 5-French orogastric tube is placed under fluoroscopic guidance. Scout imaging of the abdomen confirms barium within the transverse colon. The stomach was distended with gas. Under fluoroscopic guidance, an 18 gauge needle was utilized to puncture the anterior wall of the body of the stomach. An Amplatz wire was advanced through the needle passing a T fastener  into the lumen of the stomach. The T fastener was  secured for gastropexy. A 9-French sheath was inserted. A snare was advanced through the 9-French sheath. A Teena Dunk was advanced through the orogastric tube. It was snared then pulled out the oral cavity, pulling the snare, as well. The leading edge of the gastrostomy was attached to the snare. It was then pulled down the esophagus and out the percutaneous site. Tube secured in place. Contrast was injected. Patient tolerated the procedure well and remained hemodynamically stable throughout. No complications were encountered and no significant blood loss encountered. IMPRESSION: Status post fluoroscopic placed percutaneous gastrostomy tube, with 20 [place] pull-through. Signed, Yvone Neu. Loreta Ave, DO [facility] Electronically Signed   By: Gilmer Mor D.O.   On: 08/13/2017 15:33   Dg Finger Middle Right  Result Date: 07/30/2017 CLINICAL DATA:  Right middle finger pain. History of diabetes and gout. EXAM: RIGHT MIDDLE FINGER 2+V COMPARISON:  No recent prior. FINDINGS: Soft tissue swelling noted. Diffuse osteopenia and degenerative change. Degenerative changes most prominent about the proximal interphalangeal joint. Subtle fracture at the base of the middle phalanx of the right third digit cannot be excluded. Small exostosis noted of the proximal phalanx of the right second digit. IMPRESSION: Soft tissue swelling. Diffuse osteopenia and degenerative change. Degenerative changes most prominent about the proximal interphalangeal joint. Subtle fracture of the base of the middle phalanx of the right third digit cannot be completely excluded. Electronically Signed   By: Maisie Fus  Register   On: 07/30/2017 10:21   Dg Foot 2 Views Right  Result Date: 08/02/2017 CLINICAL DATA:  Great toe pain. EXAM: RIGHT FOOT - 2 VIEW COMPARISON:  None. FINDINGS: No acute fracture or dislocation. Severe osteopenia. Mild  osteoarthritis of the first MTP joint. Peripheral vascular atherosclerotic disease. No other soft tissue abnormality. IMPRESSION: No acute osseous injury of the right foot. Electronically Signed   By: Elige Ko   On: 08/02/2017 12:47    Labs:  Basic Metabolic Panel: BMP Latest Ref Rng & Units 08/20/2017 08/17/2017 08/13/2017  Glucose 65 - 99 mg/dL 161(W) 960(A) 540(J)  BUN 6 - 20 mg/dL 81(X) 91(Y) 78(G)  Creatinine 0.61 - 1.24 mg/dL 9.56 2.13 0.86  Sodium 135 - 145 mmol/L 136 140 138  Potassium 3.5 - 5.1 mmol/L 4.8 4.1 3.9  Chloride 101 - 111 mmol/L 104 107 109  CO2 22 - 32 mmol/L 19(L) 22 20(L)  Calcium 8.9 - 10.3 mg/dL 9.3 9.5 9.3    CBC: CBC Latest Ref Rng & Units 08/18/2017 08/11/2017 08/04/2017  WBC 4.0 - 10.5 K/uL 9.9 9.1 9.8  Hemoglobin 13.0 - 17.0 g/dL 11.4(L) 11.8(L) 11.9(L)  Hematocrit 39.0 - 52.0 % 34.8(L) 36.5(L) 35.6(L)  Platelets 150 - 400 K/uL 337 435(H) 336    CBG: Recent Labs  Lab 08/24/17 0608 08/24/17 1129 08/24/17 1644 08/24/17 2117 08/25/17 0643  GLUCAP 101* 143* 122* 145* 116*    Brief HPI:   Kaspar Albornoz Jr.is a 77 y.o.RH- male with history of HTN, Alzheimer dementia, CKD III, and T2DM who was admitted on 07/23/17 from MD office with reports of fall, difficulty walking, drooling, right gaze preference and facial droop. History taken from chart review. CTA/P head revealed R-MCA territory infarct involving right temporal and parietal lobe with heavily calcified carotid bifurcations and moderate stenosis right M1 and distal R- MCA with luxury perfusion and no mismatch.    He was not felt to be candidate for thrombolysis due to large infarct and  Dr. Pearlean Brownie recommended DAPT X 3 months. Follow up  MRI brain revealed large acute on subacute R-MCA territory infarct with petechial hemorrhage, regional right cerebrum mass effect without midline shift and moderate chronic SVD.  2Outpatient TEE/Loop recorder recommended after discharge.  Cognitive evaluation revealed  severe cognitive communication impairment question baseline but able to follow basic commands with left visual neglect. He continues to have issues with lethargy requiring tactile cues for activity, has significant left oral motor weakness--started on dysphagia 1, thin liquids and severe left neglect with pusher tendency.  Therapy working on pre-gait activity and CIR recommended due to functional deficits.    Hospital Course: Noah RumpfJames Dohrman Jr. was admitted to rehab 07/28/2017 for inpatient therapies to consist of PT, ST and OT at least three hours five days a week. Past admission physiatrist, therapy team and rehab RN have worked together to provide customized collaborative inpatient rehab. He had  pain as well as edema of right middle finger and he was found to have non displaced fracture base of Right middle finger. Attempts were made to splint finger with limited weight bearing. Right first MTP pain felt to be due to gout. Ritalin was added for activation. Blood sugars were monitored on achs basis and started trending up with initiation of tube feeds. He developed hypoglycemia with addition of low dose Amaryl therefore this was discontinued and BS have been managed with SSI per protocol.    PEG tube was placed by Dr. Loreta AveWagner on 3/14 and he is tolerating bolus tube feeds without side effects. PEG site is clean, dry and healing well.  AKI is resolving with initiation of tube feeds. Transient drop in H/H is resolving and no signs of bleeding noted.  Follow up CBC today showed rise in WBC but no signs of infection noted. He has been afebrile and recommend monitoring for any signs of infection.  His progress has been limited by diffuse weakness, right gaze preference with left neglect, poor attention with poor carry over due to signifcant  cognitive deficits.  He requires extensive care and family has elected on SNF for further therapy. His discharge was held on 3/25 due to issues with insurance approval. He was  discharged to [facility] on 08/24/17.    Rehab course: During patient's stay in rehab weekly team conferences were held to monitor patient's progress, set goals and discuss barriers to discharge. At admission, patient required total assist +2 for all ADL tasks and total assist + 3 for transfers with EVA walker.  He exhibited severe cognitive deficits with decreased attention and lack of awareness of deficits.He  has made minimal progress due to significant cognitive and physical deficits. He requires total assist for bed mobility and to initiate all transitional movements. He requires total assist with multimodal cues for ADL tasks.  He needs assist with meals. He requires max assist to express basic needs     Disposition: Skilled Nursing facility.   Diet: Dysphagia 1, thin liquids.   Special Instructions: 1. Continue PEG care bid.  2. Assist patient with meals. Provide tube feeds after attempts at eating/meals.  3. Will need referral to ortho for follow up on right middle finger fracture.    Discharge Instructions    Ambulatory referral to Physical Medicine Rehab   Complete by:  As directed    3-4 weeks appt     Allergies as of 08/24/2017   No Known Allergies     Medication List    TAKE these medications   acetaminophen 325 MG tablet Commonly known as:  TYLENOL Take  1-2 tablets (325-650 mg total) by mouth every 4 (four) hours as needed for mild pain.   albuterol (2.5 MG/3ML) 0.083% nebulizer solution Commonly known as:  PROVENTIL Take 3 mLs (2.5 mg total) by nebulization every 6 (six) hours as needed for wheezing or shortness of breath.   aspirin EC 81 MG tablet Take 81 mg by mouth daily.   atorvastatin 80 MG tablet Commonly known as:  LIPITOR Take 1 tablet (80 mg total) by mouth daily at 6 PM.   clopidogrel 75 MG tablet Commonly known as:  PLAVIX Take 1 tablet (75 mg total) by mouth daily. Start taking on:  08/25/2017   feeding supplement (OSMOLITE 1.5 CAL)  Liqd Place 280 mLs into feeding tube 4 (four) times daily - after meals and at bedtime.   feeding supplement (PRO-STAT SUGAR FREE 64) Liqd Place 30 mLs into feeding tube 3 (three) times daily between meals.   free water Soln Place 200 mLs into feeding tube 4 (four) times daily.   latanoprost 0.005 % ophthalmic solution Commonly known as:  XALATAN Place 1 drop into the left eye at bedtime.   memantine 5 MG tablet Commonly known as:  NAMENDA Take 5 mg by mouth daily.   methylphenidate 5 MG tablet--Rx #10 pills  Commonly known as:  RITALIN Take 1 tablet (5 mg total) by mouth 2 (two) times daily with breakfast and lunch.   metoprolol succinate 25 MG 24 hr tablet Commonly known as:  TOPROL-XL Take 0.5 tablets (12.5 mg total) by mouth daily.   multivitamin Tabs tablet Take 1 tablet by mouth at bedtime.   MUSCLE RUB 10-15 % Crea Apply 1 application topically 4 (four) times daily. To bilateral hands and bilateral knees   polyethylene glycol packet Commonly known as:  MIRALAX / GLYCOLAX Take 17 g by mouth 2 (two) times daily.   traZODone 50 MG tablet Commonly known as:  DESYREL Take 0.5-1 tablets (25-50 mg total) by mouth at bedtime as needed for sleep.       Contact information for follow-up providers    Ranelle Oyster, MD Follow up.   Specialty:  Physical Medicine and Rehabilitation Why:  Office will call you for follow up appointment Contact information: [address] [place] [address] [phone]        Micki Riley, MD. Call in 1 day(s).   Specialties:  Neurology, Radiology Why:  for follow up appointment Contact information: [address] [place] [address] [phone]        Gilmer Mor, DO Follow up.   Specialties:  Interventional Radiology, Radiology Why:  call as needed for problems with PEG tube.  Contact information: [address] [place] [address] [phone]            Contact  information for after-discharge care    Destination    [facility] .   Service:  Skilled Nursing Contact information: [address] [place] [address] [phone]                  Signed: Jacquelynn Cree 08/25/2017, 9:28 AM

## 2017-08-21 NOTE — Progress Notes (Signed)
Physical Therapy Session Note  Patient Details  Name: Noah RumpfJames Hurlbut Jr. MRN: [id number] Date of Birth: August 08, 1940  Today's Date: 08/21/2017 PT Missed Time: 30 Minutes Missed Time Reason: Patient ill (Comment)(low blood sugar)  Pt's blood sugar in 40s and demonstrating increased lethargy, RN present providing sugar supplements. Missed 30 min of skilled PT.   Asya Derryberry K Arnette, PT, DPT 08/21/2017, 11:17 AM

## 2017-08-21 NOTE — Progress Notes (Addendum)
Social Work Patient ID: Noah RumpfJames Markham Jr., male   DOB: [birth year], 77 y.o.   MRN: [id number] Bed offered via [facility], have contacted wife to inform she is agreeable. Plan to transfer Monday. Joseph-Admission Coordinator at [facility] to begin insurance authorization. Work on transfer Monday. Wife and son to go with him to complete paperwork Monday.

## 2017-08-22 LAB — GLUCOSE, CAPILLARY
GLUCOSE-CAPILLARY: 57 mg/dL — AB (ref 65–99)
GLUCOSE-CAPILLARY: 78 mg/dL (ref 65–99)
GLUCOSE-CAPILLARY: 132 mg/dL — AB (ref 65–99)
GLUCOSE-CAPILLARY: 117 mg/dL — AB (ref 65–99)
Glucose-Capillary: 164 mg/dL — ABNORMAL HIGH (ref 65–99)

## 2017-08-22 NOTE — Progress Notes (Signed)
Kou Gucciardo. is a 77 y.o. male September 23, 1940 [id number]  Subjective: Family from [facility] present - unaware "how affected" dad was by CVA - Pt direct-able but mumbling "nonsense like close the trunk to his son..  Objective: Vital signs in last 24 hours: Temp:  [97.5 F (36.4 C)-99 F (37.2 C)] 97.5 F (36.4 C) (03/23 0457) Pulse Rate:  [7-79] 79 (03/23 0457) Resp:  [18] 18 (03/23 0457) BP: (144-160)/(68-82) 160/68 (03/23 0457) SpO2:  [98 %-99 %] 98 % (03/23 0457) Weight change:  Last BM Date: 08/21/17  Intake/Output from previous day: 03/22 0701 - 03/23 0700 In: 1200 [P.O.:720; NG/GT:480] Out: -   Physical Exam General: No apparent distress   L neglect unchanged, hard to engage but answers questions when prompted Lungs: Normal effort. Lungs clear to auscultation, no crackles or wheezes. Cardiovascular: Regular rate and rhythm, no edema Neurological: No new neurological deficits   Lab Results: BMET    Component Value Date/Time   NA 136 08/20/2017 0601   NA 135 (L) 05/13/2013 1318   K 4.8 08/20/2017 0601   K 3.7 05/13/2013 1318   CL 104 08/20/2017 0601   CL 103 05/13/2013 1318   CO2 19 (L) 08/20/2017 0601   CO2 26 05/13/2013 1318   GLUCOSE 107 (H) 08/20/2017 0601   GLUCOSE 171 (H) 05/13/2013 1318   BUN 31 (H) 08/20/2017 0601   BUN 11 05/13/2013 1318   CREATININE 1.04 08/20/2017 0601   CREATININE 1.17 05/13/2013 1318   CALCIUM 9.3 08/20/2017 0601   CALCIUM 10.1 05/13/2013 1318   GFRNONAA >60 08/20/2017 0601   GFRNONAA >60 05/13/2013 1318   GFRAA >60 08/20/2017 0601   GFRAA >60 05/13/2013 1318   CBC    Component Value Date/Time   WBC 9.9 08/18/2017 0552   RBC 4.22 08/18/2017 0552   HGB 11.4 (L) 08/18/2017 0552   HGB 9.3 (L) 05/13/2013 1318   HCT 34.8 (L) 08/18/2017 0552   HCT 29.1 (L) 05/13/2013 1318   PLT 337 08/18/2017 0552   PLT 503 (H) 05/13/2013 1318   MCV 82.5 08/18/2017 0552   MCV 75 (L) 05/13/2013 1318   MCH 27.0 08/18/2017 0552   MCHC 32.8  08/18/2017 0552   RDW 14.9 08/18/2017 0552   RDW 20.5 (H) 05/13/2013 1318   LYMPHSABS 3.1 07/29/2017 0922   LYMPHSABS 3.9 (H) 04/06/2013 0423   MONOABS 0.6 07/29/2017 0922   MONOABS 0.7 04/06/2013 0423   EOSABS 0.3 07/29/2017 0922   EOSABS 0.0 04/06/2013 0423   BASOSABS 0.0 07/29/2017 0922   BASOSABS 0.0 04/06/2013 0423   CBG's (last 3):   Recent Labs    08/21/17 2240 08/22/17 0628 08/22/17 0703  GLUCAP 75 57* 78   LFT's Lab Results  Component Value Date   ALT 13 (L) 07/29/2017   AST 21 07/29/2017   ALKPHOS 51 07/29/2017   BILITOT 0.5 07/29/2017    Studies/Results: No results found.  Medications:  I have reviewed the patient's current medications. Scheduled Medications: . aspirin EC  81 mg Oral Daily  . atorvastatin  80 mg Oral q1800  . clopidogrel  75 mg Oral Daily  . feeding supplement (OSMOLITE 1.5 CAL)  280 mL Per Tube TID PC & HS  . feeding supplement (PRO-STAT SUGAR FREE 64)  30 mL Per Tube TID BM  . free water  200 mL Per Tube QID  . insulin aspart  0-9 Units Subcutaneous TID WC  . latanoprost  1 drop Left Eye QHS  . memantine  5 mg Oral Daily  . methylphenidate  5 mg Oral BID WC  . metoprolol succinate  12.5 mg Oral Daily  . multivitamin  1 tablet Oral QHS  . MUSCLE RUB   Topical QID  . 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 R MCA CVA with AMS/lethargy, L neglect and facial droop - continue CIR and medical therapies as ongoing - Support provided to son in  room 2. DM2 - SSi + nutritional guidance with TFs 3. hypertension - continue current rx and adjust as needed 4. Vascular dementia, exac by #1  Length of stay, days: 25   Alejandria Wessells A. Felicity CoyerLeschber, MD 08/22/2017, 10:48 AM

## 2017-08-22 NOTE — Progress Notes (Signed)
Hypoglycemic Event  CBG: 57  Treatment: 15 GM carbohydrate snack  Symptoms: None  Follow-up CBG: Time:0703 CBG Result:78  Possible Reasons for Event: Unknown  Comments/MD notified:treated per protocol    Alfredo MartinezMurray, Kirin Brandenburger A

## 2017-08-22 NOTE — Progress Notes (Signed)
Hypoglycemic Event  CBG: 59  Treatment: 15 GM carbohydrate snack  Symptoms: None  Follow-up CBG: Time:2208 CBG Result:67  Possible Reasons for Event: Unknown  Comments/MD notified:treated per protocol    Alfredo MartinezMurray, Nolah Krenzer A

## 2017-08-22 NOTE — Progress Notes (Signed)
Hypoglycemic Event  CBG: 67  Treatment: 15 GM carbohydrate snack  Symptoms: None  Follow-up CBG: Time:2240 CBG Result:75  Possible Reasons for Event: Unknown  Comments/MD notified:treated per protocol    Alfredo MartinezMurray, Harland Aguiniga A

## 2017-08-23 ENCOUNTER — Inpatient Hospital Stay (HOSPITAL_COMMUNITY): Payer: Medicare Other

## 2017-08-23 ENCOUNTER — Inpatient Hospital Stay (HOSPITAL_COMMUNITY): Payer: Medicare Other | Admitting: Occupational Therapy

## 2017-08-23 LAB — GLUCOSE, CAPILLARY
Glucose-Capillary: 90 mg/dL (ref 65–99)
Glucose-Capillary: 110 mg/dL — ABNORMAL HIGH (ref 65–99)
Glucose-Capillary: 187 mg/dL — ABNORMAL HIGH (ref 65–99)
Glucose-Capillary: 140 mg/dL — ABNORMAL HIGH (ref 65–99)

## 2017-08-23 NOTE — Progress Notes (Signed)
Physical Therapy Discharge Summary  Patient Details  Name: Noah Bush. MRN: [id number] Date of Birth: 1941-01-22  Today's Date: 08/23/2017 PT Individual Time: 9381-0175 PT Individual Time Calculation (min): 54 min  Pt c/o pain in hands and feet when attempting mobility; repositioned as able. Pt initially alert and son present throughout session encouraging patient as able. Pt refusing to participate in session despite various encouragement and education attempts. Total +2 to come to EOB with pt at times actively resisting movement. Once positioned EOB pt able to maintain sitting balance with supervision assist until pt decided to let himself fall backwards and requires total assist to correct. Pt with no attempts to bring gaze to midline despite various efforts and encouragement. Sit <> stand from EOB with +2 assist from PT and rehab tech and son in front to help motivate and bring pt forward pt pt putting forth no active initiation or movement to sustain position requiring total assist for weightbearing and anterior weightshift to bring weight into feet. Returned to EOB and attempted to use maxi move to transfer to w/c but pt with soiled brief requiring total +2 for changing of brief and hygiene at bed level with total +2 for rolling due to pt then falling asleep and refusing to assist with the task. Discussion and education with son provided throughout.   Patient has met 1 of 4 long term goals. Pt with minimal participation the last week, declining in functional status and active participation. Patient to discharge at a wheelchair level total +2.   Patient's family is unable to provide the necessary physical and cognitive assistance at discharge and therefore, d/c plan is for SNF after attempting initial family education.   Reasons goals not met:  Due to significant impairments and pt's decreased participation, functional improvements have not been made.  Recommendation:  Patient will benefit  from ongoing skilled PT services in skilled nursing facility setting to continue to advance safe functional mobility, address ongoing impairments in strength, balance, postural control, cognition, endurance, attention, neglect, awareness, ROM, and minimize fall risk.  Equipment: TBD at next venue of care.  Reasons for discharge: lack of progress toward goals and discharge from hospital  Patient/family agrees with progress made and goals achieved: Yes  PT Discharge Precautions/Restrictions Precautions Precautions: Fall Precaution Comments: severe left neglect Restrictions Weight Bearing Restrictions: No Vision/Perception  Perception Perception: Impaired Inattention/Neglect: Does not attend to left visual field Praxis Praxis: Impaired Praxis Impairment Details: Initiation;Motor planning  Cognition Overall Cognitive Status: Impaired/Different from baseline Attention: Focused Sustained Attention: Impaired Memory: Impaired Awareness: Impaired Awareness Impairment: Intellectual impairment Problem Solving: Impaired Safety/Judgment: Impaired Sensation Sensation Light Touch: Impaired by gross assessment Proprioception Impaired Details: Impaired LUE;Impaired LLE Coordination Gross Motor Movements are Fluid and Coordinated: No Fine Motor Movements are Fluid and Coordinated: No Motor  Motor Motor: Motor apraxia;Hemiplegia;Abnormal postural alignment and control  Trunk/Postural Assessment  Cervical Assessment Cervical Assessment: Exceptions to WFL(maintains R lateral rotation) Thoracic Assessment Thoracic Assessment: Exceptions to WFL(flexed posture or pushes posterior) Lumbar Assessment Lumbar Assessment: Exceptions to WFL(posterior pelvic tilt; decreased ROM) Postural Control Postural Control: Deficits on evaluation Head Control: maintains R lateral rotation Trunk Control: impaired; tendency for posterior LOB or pushing  Balance Balance Balance Assessed: Yes Static  Sitting Balance Static Sitting - Level of Assistance: 4: Min assist;5: Stand by assistance Dynamic Sitting Balance Dynamic Sitting - Level of Assistance: 4: Min assist;1: +1 Total assist(when engaged and activiely participating; or total A) Static Standing Balance Static Standing - Level of Assistance:  1: +2 Total assist Extremity Assessment      RLE Assessment RLE Assessment: Exceptions to [facility] RLE Strength RLE Overall Strength Comments: difficult to assess due to cognition and refusal; grossly able to move RLE when willing LLE Assessment LLE Assessment: Exceptions to [facility] LLE Strength LLE Overall Strength Comments: difficult to assess due to cognition and refusal; no active movement noted during this session; knee lacks flexion due to chronic issues   See Function Navigator for Current Functional Status.  Canary Brim Ivory Broad, PT, DPT  08/23/2017, 10:53 AM

## 2017-08-23 NOTE — Progress Notes (Signed)
Noah Bush. is a 77 y.o. male 04/24/1941 [id number]  Subjective: Different son at [facility] today. Pt remains somnolent and confused mumbling.  Objective: Vital signs in last 24 hours: Temp:  [98.7 F (37.1 C)] 98.7 F (37.1 C) (03/24 0516) Pulse Rate:  [74-76] 76 (03/24 0516) Resp:  [18-19] 19 (03/24 0516) BP: (116-145)/(68-75) 116/75 (03/24 0516) SpO2:  [99 %] 99 % (03/24 0516) Weight change:  Last BM Date: 08/22/17  Intake/Output from previous day: 03/23 0701 - 03/24 0700 In: 910 [P.O.:430; NG/GT:480] Out: -   Physical Exam General: No apparent distress. L neglect unchanged, hard to engage but answers questions when prompted Lungs: Normal effort. Lungs clear to auscultation, no crackles or wheezes. Cardiovascular: Regular rate and rhythm, no edema Neurological: No new neurological deficits   Lab Results: BMET    Component Value Date/Time   NA 136 08/20/2017 0601   NA 135 (L) 05/13/2013 1318   K 4.8 08/20/2017 0601   K 3.7 05/13/2013 1318   CL 104 08/20/2017 0601   CL 103 05/13/2013 1318   CO2 19 (L) 08/20/2017 0601   CO2 26 05/13/2013 1318   GLUCOSE 107 (H) 08/20/2017 0601   GLUCOSE 171 (H) 05/13/2013 1318   BUN 31 (H) 08/20/2017 0601   BUN 11 05/13/2013 1318   CREATININE 1.04 08/20/2017 0601   CREATININE 1.17 05/13/2013 1318   CALCIUM 9.3 08/20/2017 0601   CALCIUM 10.1 05/13/2013 1318   GFRNONAA >60 08/20/2017 0601   GFRNONAA >60 05/13/2013 1318   GFRAA >60 08/20/2017 0601   GFRAA >60 05/13/2013 1318   CBC    Component Value Date/Time   WBC 9.9 08/18/2017 0552   RBC 4.22 08/18/2017 0552   HGB 11.4 (L) 08/18/2017 0552   HGB 9.3 (L) 05/13/2013 1318   HCT 34.8 (L) 08/18/2017 0552   HCT 29.1 (L) 05/13/2013 1318   PLT 337 08/18/2017 0552   PLT 503 (H) 05/13/2013 1318   MCV 82.5 08/18/2017 0552   MCV 75 (L) 05/13/2013 1318   MCH 27.0 08/18/2017 0552   MCHC 32.8 08/18/2017 0552   RDW 14.9 08/18/2017 0552   RDW 20.5 (H) 05/13/2013 1318   LYMPHSABS 3.1  07/29/2017 0922   LYMPHSABS 3.9 (H) 04/06/2013 0423   MONOABS 0.6 07/29/2017 0922   MONOABS 0.7 04/06/2013 0423   EOSABS 0.3 07/29/2017 0922   EOSABS 0.0 04/06/2013 0423   BASOSABS 0.0 07/29/2017 0922   BASOSABS 0.0 04/06/2013 0423   CBG's (last 3):   Recent Labs    08/22/17 1643 08/22/17 2111 08/23/17 0641  GLUCAP 117* 164* 90   LFT's Lab Results  Component Value Date   ALT 13 (L) 07/29/2017   AST 21 07/29/2017   ALKPHOS 51 07/29/2017   BILITOT 0.5 07/29/2017    Studies/Results: No results found.  Medications:  I have reviewed the patient's current medications. Scheduled Medications: . aspirin EC  81 mg Oral Daily  . atorvastatin  80 mg Oral q1800  . clopidogrel  75 mg Oral Daily  . feeding supplement (OSMOLITE 1.5 CAL)  280 mL Per Tube TID PC & HS  . feeding supplement (PRO-STAT SUGAR FREE 64)  30 mL Per Tube TID BM  . free water  200 mL Per Tube QID  . insulin aspart  0-9 Units Subcutaneous TID WC  . latanoprost  1 drop Left Eye QHS  . memantine  5 mg Oral Daily  . methylphenidate  5 mg Oral BID WC  . metoprolol succinate  12.5 mg  Oral Daily  . multivitamin  1 tablet Oral QHS  . MUSCLE RUB   Topical QID  . 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 R MCA CVA with AMS/lethargy, L neglect and facial droop - continue CIR and medical therapies as ongoing - Support provided to son in room 2. DM2 - SSI + nutritional guidance with TFs 3. hypertension - continue current rx  and adjust as needed 4. Vascular dementia, exac by #1  Length of stay, days: 26   Delwyn Scoggin A. Felicity CoyerLeschber, MD 08/23/2017, 11:48 AM

## 2017-08-23 NOTE — Discharge Summary (Signed)
Occupational Therapy Discharge Summary  Patient Details  Name: Noah Bush. MRN: [id number] Date of Birth: [birth year]  Today's Date: 08/23/2017 OT Individual Time: 6387-5643 OT Individual Time Calculation (min): 59 min   Patient has met 0 of 14 long term goals due to lack of functional progress. Pts progress was limited by severe cognitive Noah physical impairments as well as resistive behaviors. Patient to discharge at overall Total Assist level.  Patient's next venue of care is SNF to continue therapeutic rehabilitation in hopes to return home with family when functional status improves.  Reasons goals not met: combination of significant cognitive Noah physical deficits impeding progress towards established goals   Recommendation:  Patient will benefit from ongoing skilled OT services in skilled nursing facility setting to continue to advance functional skills in the area of BADL.  Equipment: No equipment provided  Reasons for discharge: lack of progress toward goals Noah discharge from hospital  Patient/family agrees with progress made Noah goals achieved: Yes   Skilled Therapeutic Intervention:  Pt greeted supine in bed. Maxi transfer completed with 2 assist for safety, pt requiring Total A for bed mobility during pad placement Noah intermittently falling asleep. Once in TIS, had pt engage in grooming tasks, with Max multimodal cues Noah Max-Total A, resistive movement noted. Noah Bush, Noah Bush, Noah maintaining alertness while listening to favorite music. Pt initiating minimal movement, however did tap his Rt hand with command Noah occasionally rotate neck to neutral position. Sensory input provided to Lt UE throughout for NMR purposes as Noah initiated no movement in this side. Required Total A overall to participate with HOH as needed. At session exit pt was escorted to RN station with TIS reclined Noah safety  belt fastened.   OT Discharge Precautions/Restrictions  Precautions Precautions: Fall Precaution Comments: severe left neglect Restrictions Weight Bearing Restrictions: No ADL ADL ADL Comments: Please see functional navigator for ADL status Vision Vision Assessment?: Yes Alignment/Gaze Preference: Gaze right;Head turned Perception  Perception: Impaired Inattention/Neglect: Does not attend to left visual field;Does not attend to left side of body Praxis Praxis: Impaired Praxis Impairment Details: Initiation;Motor planning Cognition Overall Cognitive Status: Impaired/Different from baseline Arousal/Alertness: Lethargic Orientation Level: Oriented to person Sustained Attention: Impaired Memory: Impaired Awareness: Impaired Problem Solving: Impaired Safety/Judgment: Impaired Comments: severe left inattention + cognitive deficits Sensation Coordination Gross Motor Movements are Fluid Noah Coordinated: No Fine Motor Movements are Fluid Noah Coordinated: No Motor  Motor Motor: Motor apraxia;Hemiplegia;Abnormal postural alignment Noah control Mobility  Transfers Transfers: (Maxi Lift +2 assist)  Trunk/Postural Assessment  Cervical Assessment Cervical Assessment: Exceptions to WFL(Laterally rotated to Rt) Thoracic Assessment Thoracic Assessment: Exceptions to WFL(flexed) Lumbar Assessment Lumbar Assessment: Exceptions to WFL(Posterior pelvic tilt) Postural Control Postural Control: Deficits on evaluation Extremity/Trunk Assessment RUE Assessment RUE Assessment: Within Functional Limits LUE Assessment LUE Assessment: Exceptions to WFL(Difficult to assess functional abilities with Lt due to inattention/cognitive impairments)   See Function Navigator for Current Functional Status.  Yasmene Salomone A Neita Landrigan 08/23/2017, 12:22 PM

## 2017-08-23 NOTE — Plan of Care (Signed)
0/14 goals met due to severe cognitive and physical impairments limiting progress 

## 2017-08-24 ENCOUNTER — Inpatient Hospital Stay (HOSPITAL_COMMUNITY): Payer: Medicare Other

## 2017-08-24 LAB — CBC
HCT: 37.8 % — ABNORMAL LOW (ref 39.0–52.0)
Hemoglobin: 12.5 g/dL — ABNORMAL LOW (ref 13.0–17.0)
MCH: 26.5 pg (ref 26.0–34.0)
MCHC: 33.1 g/dL (ref 30.0–36.0)
MCV: 80.3 fL (ref 78.0–100.0)
PLATELETS: 278 10*3/uL (ref 150–400)
RBC: 4.71 MIL/uL (ref 4.22–5.81)
RDW: 14.5 % (ref 11.5–15.5)
WBC: 12.1 10*3/uL — AB (ref 4.0–10.5)

## 2017-08-24 LAB — GLUCOSE, CAPILLARY
GLUCOSE-CAPILLARY: 143 mg/dL — AB (ref 65–99)
Glucose-Capillary: 101 mg/dL — ABNORMAL HIGH (ref 65–99)
Glucose-Capillary: 122 mg/dL — ABNORMAL HIGH (ref 65–99)
Glucose-Capillary: 145 mg/dL — ABNORMAL HIGH (ref 65–99)

## 2017-08-24 LAB — BASIC METABOLIC PANEL
Anion gap: 11 (ref 5–15)
BUN: 33 mg/dL — AB (ref 6–20)
CO2: 23 mmol/L (ref 22–32)
CREATININE: 1.16 mg/dL (ref 0.61–1.24)
Calcium: 9.5 mg/dL (ref 8.9–10.3)
Chloride: 101 mmol/L (ref 101–111)
GFR calc Af Amer: >60 mL/min (ref >60)
GFR, EST NON AFRICAN AMERICAN: 59 mL/min — AB (ref >60)
Glucose, Bld: 164 mg/dL — ABNORMAL HIGH (ref 65–99)
Potassium: 4.6 mmol/L (ref 3.5–5.1)
SODIUM: 135 mmol/L (ref 135–145)

## 2017-08-24 MED ORDER — PRO-STAT SUGAR FREE PO LIQD: 30.0000 mL | Freq: Three times a day (TID) | ORAL | 0 refills | Status: DC

## 2017-08-24 MED ORDER — ACETAMINOPHEN 325 MG PO TABS: 325.0000 mg | ORAL_TABLET | ORAL | Status: AC | PRN

## 2017-08-24 MED ORDER — RENA-VITE PO TABS: 1.0000 | ORAL_TABLET | Freq: Every day | ORAL | 0 refills | Status: AC

## 2017-08-24 MED ORDER — LATANOPROST 0.005 % OP SOLN: 1.0000 [drp] | Freq: Every day | OPHTHALMIC | 12 refills | Status: AC

## 2017-08-24 MED ORDER — TRAZODONE HCL 50 MG PO TABS: 25.0000 mg | ORAL_TABLET | Freq: Every evening | ORAL | Status: DC | PRN

## 2017-08-24 MED ORDER — MUSCLE RUB 10-15 % EX CREA: 1.0000 "application " | TOPICAL_CREAM | Freq: Four times a day (QID) | CUTANEOUS | 0 refills | Status: DC

## 2017-08-24 MED ORDER — FREE WATER: 200.0000 mL | Freq: Four times a day (QID) | Status: DC

## 2017-08-24 MED ORDER — CLOPIDOGREL BISULFATE 75 MG PO TABS: 75.0000 mg | ORAL_TABLET | Freq: Every day | ORAL | Status: AC

## 2017-08-24 MED ORDER — METHYLPHENIDATE HCL 5 MG PO TABS: 5.0000 mg | ORAL_TABLET | Freq: Two times a day (BID) | ORAL | 0 refills | Status: DC

## 2017-08-24 MED ORDER — OSMOLITE 1.5 CAL PO LIQD: 280.0000 mL | Freq: Three times a day (TID) | ORAL | 0 refills | Status: DC

## 2017-08-24 MED ORDER — ALBUTEROL SULFATE (2.5 MG/3ML) 0.083% IN NEBU: 2.5000 mg | INHALATION_SOLUTION | Freq: Four times a day (QID) | RESPIRATORY_TRACT | 12 refills | Status: DC | PRN

## 2017-08-24 MED ORDER — POLYETHYLENE GLYCOL 3350 17 G PO PACK: 17.0000 g | PACK | Freq: Two times a day (BID) | ORAL | 0 refills | Status: DC

## 2017-08-24 NOTE — Progress Notes (Signed)
Speech Language Pathology Discharge Summary  Patient Details  Name: Noah Bolar Jr. MRN: [id number] Date of Birth: 06/12/1940  Today's Date: 08/24/2017  Patient has met 7 of 7 long term goals.  Patient to discharge at overall Total level.    Clinical Impression/Discharge Summary:   Pt continues to require Total A for all ADLs and has not been a candidate for further diet upgrade d/t severe cognitive impairments. Pt continues to safely consume dysphagia 1 with thin liquids without overt s/s of aspiration but he has not been able to increase consumption d/t cognitive deficits and confusion.   Care Partner:  Caregiver Able to Provide Assistance: No  Type of Caregiver Assistance: Physical;Cognitive  Recommendation:  Skilled Nursing facility  Rationale for SLP Follow Up: Maximize cognitive function and independence;Maximize functional communication;Maximize swallowing safety;Reduce caregiver burden   Equipment:     Reasons for discharge: Discharged from hospital   Patient/Family Agrees with Progress Made and Goals Achieved: Yes   Function:  Eating Eating   Modified Consistency Diet: Yes Eating Assist Level: Helper feeds patient       Helper Brings Food to Mouth: Every scoop   Cognition Comprehension Comprehension assist level: Understands basic less than 25% of the time/ requires cueing >75% of the time  Expression   Expression assist level: Expresses basis less than 25% of the time/requires cueing >75% of the time.  Social Interaction Social Interaction assist level: Interacts appropriately less than 25% of the time. May be withdrawn or combative.  Problem Solving Problem solving assist level: Solves basic less than 25% of the time - needs direction nearly all the time or does not effectively solve problems and may need a restraint for safety  Memory Memory assist level: Recognizes or recalls less than 25% of the time/requires cueing greater than 75% of the time      08/24/2017, 8:09 AM    

## 2017-08-24 NOTE — Progress Notes (Signed)
Social Work Patient ID: Noah RumpfJames Mckee Jr., male   DOB: 02-14-1941, 77 y.o.   MRN: [id number]   Pt has been accepted at [facility] and they are awaiting insurance company approval for transfer.  CSW updated family and staff of the above and that we are on hold until approval is given.  CSW called SNF multiple times today and they have called [employer] multiple times without success.  CSW will continue to work toward pt's transfer to SNF, but hospital team is ready on our end for transfer.

## 2017-08-24 NOTE — Progress Notes (Signed)
[facility] PHYSICAL MEDICINE & REHABILITATION     PROGRESS NOTE  Subjective/Complaints:  No new complaints. Awake  ROS: Limited due to cognitive/behavioral   Objective: Vital Signs: Blood pressure (!) 151/88, pulse 84, temperature 98.2 F (36.8 C), temperature source Oral, resp. rate 20, height 5\' 11"  (1.803 m), weight 117.2 kg (258 lb 6.1 oz), SpO2 100 %. No results found. No results for input(s): WBC, HGB, HCT, PLT in the last 72 hours. No results for input(s): NA, K, CL, GLUCOSE, BUN, CREATININE, CALCIUM in the last 72 hours.  Invalid input(s): CO CBG (last 3)  Recent Labs    08/23/17 1643 08/23/17 2119 08/24/17 0608  GLUCAP 187* 140* 101*    Wt Readings from Last 3 Encounters:  08/24/17 117.2 kg (258 lb 6.1 oz)  07/23/17 124.3 kg (274 lb)  06/29/17 124.3 kg (274 lb)    Physical Exam:  BP (!) 151/88 (BP Location: Left Arm)   Pulse 84   Temp 98.2 F (36.8 C) (Oral)   Resp 20   Ht 5\' 11"  (1.803 m)   Wt 117.2 kg (258 lb 6.1 oz)   SpO2 100%   BMI 36.04 kg/m  Constitutional: No distress . Vital signs reviewed. HEENT: EOMI, oral membranes moist Cardiovascular: RRR without murmur. No JVD     Respiratory: CTA Bilaterally without wheezes or rales. Normal effort    GI: BS +, non-tender, non-distended. PEG site clean and dry   Musculoskeletal: Right 3rd finger with ktape in place Neurological:  Alert but confused Left facial weakness with ongoing dysarthria Follows basic commands with verbal cueing. Poor initiation Motor: RUE 3+/5 proximal to distal with limitations at left hand RLE 3-/5 proximal to distal  LUE: 2+ to 3/5 proximal to distal LLE: 1/5 proximal to distal--- no changes in motor/sensory exam Skin: Skin is dry,warm   Psychiatric:  flat  Assessment/Plan: 1. Functional deficits secondary to large acute on subacute R-MCA territory infarct with petechial hemorrhage which require 3+ hours per day of interdisciplinary therapy in a comprehensive inpatient rehab  setting. Physiatrist is providing close team supervision and 24 hour management of active medical problems listed below. Physiatrist and rehab team continue to assess barriers to discharge/monitor patient progress toward functional and medical goals.  Function:  Bathing Bathing position   Position: Bed  Bathing parts Body parts bathed by patient: Abdomen, Chest Body parts bathed by helper: Right arm, Left arm, Chest, Abdomen, Front perineal area, Buttocks, Right upper leg, Left upper leg, Right lower leg, Left lower leg, Back  Bathing assist Assist Level: 2 helpers      Upper Body Dressing/Undressing Upper body dressing   What is the patient wearing?: Hospital gown       Pull over shirt/dress - Perfomed by helper: Thread/unthread right sleeve, Thread/unthread left sleeve        Upper body assist Assist Level: 2 helpers      Lower Body Dressing/Undressing Lower body dressing Lower body dressing/undressing activity did not occur: (Dressing, bathing, grooming FIM scores from most recent staff documentation) What is the patient wearing?: Hospital Gown, Non-skid slipper socks       Pants- Performed by helper: Thread/unthread right pants leg, Thread/unthread left pants leg, Pull pants up/down   Non-skid slipper socks- Performed by helper: Don/doff right sock, Don/doff left sock                  Lower body assist Assist for lower body dressing: 2 Designer, multimediaHelpers      Toileting Toileting Toileting  activity did not occur: No continent bowel/bladder event   Toileting steps completed by helper: Adjust clothing prior to toileting, Performs perineal hygiene, Adjust clothing after toileting    Toileting assist Assist level: Two helpers   Transfers Chair/bed transfer   Chair/bed transfer method: Other Chair/bed transfer assist level: dependent (Pt equals 0%) Chair/bed transfer assistive device: Mechanical lift Mechanical lift: Maximove   Locomotion Ambulation Ambulation  activity did not occur: Safety/medical concerns   Max distance: 7 Assist level: 2 helpers   Education administrator activity did not occur: Refused Type: Manual   Assist Level: Dependent (Pt equals 0%)(in TIS)  Cognition Comprehension Comprehension assist level: Understands basic less than 25% of the time/ requires cueing >75% of the time  Expression Expression assist level: Expresses basis less than 25% of the time/requires cueing >75% of the time.  Social Interaction Social Interaction assist level: Interacts appropriately less than 25% of the time. May be withdrawn or combative.  Problem Solving Problem solving assist level: Solves basic less than 25% of the time - needs direction nearly all the time or does not effectively solve problems and may need a restraint for safety  Memory Memory assist level: Recognizes or recalls less than 25% of the time/requires cueing greater than 75% of the time    Medical Problem List and Plan: 1.  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   Cont CIR--SNF pending today   -I can follow up with patient in 4-8 weeks     2.  DVT Prophylaxis/Anticoagulation: start  Pharmaceutical: Lovenox 3. Pain Management: tylenol prn. Added Sportscreme to help with joint stiffness bilateral hands and left knee.    -Right hand middle finger pain, base of fracture nondisplaced     = splint, Ortho follow-up after hospitalization--- ongoing discomfort in right hand   -Right first MTP pain likely gout. xrays negative     4. Mood: Team to provide encouragement/ego support. LCSW to follow for evaluation when appropriate.  5. Alzheimer's dementia/ Neuropsych: This patient is not capable of making decisions on his own behalf. Continue Namenda    -continue methylphenidate  10mg  bid to optimize arousal and initiation---? benefit 6. Skin/Wound Care:  Pressure relief measures. Maintain adequate nutritional and hydration status.  7. Fluids/Electrolytes/Nutrition: poor intake   - PEG placed 08/13/17.     -Tolerating TF   -Free water flushes through PEG increased to QID    -BUN/Cr holding at 31/1.04---no changes at present---recheck Monday 8. T2DM: Monitor BS ac/hs. Continue SSI.    -cbg's increased on TF   -began low dose amaryl 1mg  daily and sugars bottomed out---will d/c and observe today CBG (last 3)  Recent Labs    08/23/17 1643 08/23/17 2119 08/24/17 0608  GLUCAP 187* 140* 101*   9. HTN: Monitor BP bid-       Vitals:   08/23/17 1445 08/24/17 0453  BP: (!) 143/79 (!) 151/88  Pulse: (!) 116 84  Resp: 20 20  Temp: 98.2 F (36.8 C) 98.2 F (36.8 C)  SpO2: 94% 100%    Metoprolol XL to 25 mg daily on 3/6 decreased to 12.5 on 3/10 to avoid hypoperfusion--BP's had been well controlled but with noticeable increase this morning.   10. CKD stage III:  .    Cr  1.04 11. Dyslipidemia: now on Lipitor.   12. Morbid obesity  Body mass index is 38.22 kg/m.             Diet and exercise education 13. Acute blood loss anemia   Hemoglobin 11.9  3/12   -Hemoglobin 11.8 14.  Bowel incontinence:  Continue to address with nursing team 15. Bladder: incontinent  16. Cough: Improved   -Continue Robitussin scheduled--- changed to as needed   -Aspiration precautions     LOS (Days) 27 A FACE TO FACE EVALUATION WAS PERFORMED  Ranelle Oyster 08/24/2017 8:59 AM

## 2017-08-25 ENCOUNTER — Inpatient Hospital Stay (HOSPITAL_COMMUNITY): Payer: Medicare Other

## 2017-08-25 LAB — GLUCOSE, CAPILLARY: Glucose-Capillary: 116 mg/dL — ABNORMAL HIGH (ref 65–99)

## 2017-08-25 MED ORDER — FREE WATER: 250.0000 mL | Freq: Four times a day (QID) | Status: DC

## 2017-08-25 MED ORDER — FREE WATER
250.0000 mL | Freq: Four times a day (QID) | Status: DC
Start: 2017-08-25 — End: 2017-08-25

## 2017-08-25 NOTE — Progress Notes (Signed)
[place] PHYSICAL MEDICINE & REHABILITATION     PROGRESS NOTE  Subjective/Complaints:  No new complaints. Denies pain this morning  ROS: Limited due to cognitive/behavioral   Objective: Vital Signs: Blood pressure 130/72, pulse 94, temperature 98.4 F (36.9 C), temperature source Oral, resp. rate 18, height 5\' 11"  (1.803 m), weight 116 kg (255 lb 11.7 oz), SpO2 95 %. No results found. Recent Labs    08/24/17 0930  WBC 12.1*  HGB 12.5*  HCT 37.8*  PLT 278   Recent Labs    08/24/17 0930  NA 135  K 4.6  CL 101  GLUCOSE 164*  BUN 33*  CREATININE 1.16  CALCIUM 9.5   CBG (last 3)  Recent Labs    08/24/17 1644 08/24/17 2117 08/25/17 0643  GLUCAP 122* 145* 116*    Wt Readings from Last 3 Encounters:  08/25/17 116 kg (255 lb 11.7 oz)  07/23/17 124.3 kg (274 lb)  06/29/17 124.3 kg (274 lb)    Physical Exam:  BP 130/72 (BP Location: Left Arm)   Pulse 94   Temp 98.4 F (36.9 C) (Oral)   Resp 18   Ht 5\' 11"  (1.803 m)   Wt 116 kg (255 lb 11.7 oz)   SpO2 95%   BMI 35.67 kg/m  Constitutional: No distress . Vital signs reviewed. HEENT: EOMI, oral membranes moist Cardiovascular: RRR without murmur. No JVD    Respiratory: CTA Bilaterally without wheezes or rales. Normal effort    GI: BS +, non-tender, non-distended . PEG site clean and dry   Musculoskeletal: Right 3rd finger with ktape in place Neurological:  Alert but confused Left facial weakness with ongoing dysarthria Follows basic commands with verbal cueing. Poor initiation Motor: RUE 3+/5 proximal to distal with limitations at left hand RLE 3-/5 proximal to distal  LUE: 2+ to 3/5 proximal to distal LLE: 1/5 proximal to distal--- no changes in motor/sensory exam Skin: Skin is dry,warm   Psychiatric:  flat  Assessment/Plan: 1. 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 Physiatrist is providing close team supervision and 24 hour management of active medical problems listed below. Physiatrist and rehab team continue to assess barriers to discharge/monitor patient progress toward functional and medical goals.  Function:  Bathing Bathing position   Position: Bed  Bathing parts Body parts bathed by patient: Abdomen, Chest Body parts bathed by helper: Right arm, Left arm, Chest, Abdomen, Front perineal area, Buttocks, Right upper leg, Left upper leg, Right lower leg, Left lower leg, Back  Bathing assist Assist Level: 2 helpers      Upper Body Dressing/Undressing Upper body dressing   What is the patient wearing?: Hospital gown       Pull over shirt/dress - Perfomed by helper: Thread/unthread right sleeve, Thread/unthread left sleeve        Upper body assist Assist Level: 2 helpers      Lower Body Dressing/Undressing Lower body dressing Lower body dressing/undressing activity did not occur: (Dressing, bathing, grooming FIM scores from most recent staff documentation) What is the patient wearing?: Hospital Gown, Non-skid slipper socks       Pants- Performed by helper: Thread/unthread right pants leg, Thread/unthread left pants leg, Pull pants up/down   Non-skid slipper socks- Performed by helper: Don/doff right sock, Don/doff left sock  Lower body assist Assist for lower body dressing: 2 Helpers      Toileting Toileting Toileting activity did not occur: No continent bowel/bladder event   Toileting steps completed by helper: Adjust clothing prior to toileting, Performs perineal hygiene, Adjust clothing after toileting    Toileting assist Assist level: Two helpers   Transfers Chair/bed transfer   Chair/bed transfer method: Other Chair/bed transfer assist level: dependent (Pt equals 0%) Chair/bed transfer assistive device: Mechanical lift Mechanical lift: Maximove    Locomotion Ambulation Ambulation activity did not occur: Safety/medical concerns   Max distance: 7 Assist level: 2 helpers   Education administratorWheelchair Wheelchair activity did not occur: Refused Type: Manual   Assist Level: Dependent (Pt equals 0%)(in TIS)  Cognition Comprehension Comprehension assist level: Understands basic 25 - 49% of the time/ requires cueing 50 - 75% of the time  Expression Expression assist level: Expresses basic 25 - 49% of the time/requires cueing 50 - 75% of the time. Uses single words/gestures.  Social Interaction Social Interaction assist level: Interacts appropriately less than 25% of the time. May be withdrawn or combative.  Problem Solving Problem solving assist level: Solves basic less than 25% of the time - needs direction nearly all the time or does not effectively solve problems and may need a restraint for safety  Memory Memory assist level: Recognizes or recalls less than 25% of the time/requires cueing greater than 75% of the time    Medical Problem List and Plan: 1.  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   Cont CIR--SNF pending    -I can follow up with patient in 4-8 weeks     2.  DVT Prophylaxis/Anticoagulation: start  Pharmaceutical: Lovenox 3. Pain Management: tylenol prn. Added Sportscreme to help with joint stiffness bilateral hands and left knee.    -Right hand middle finger pain, base of fracture nondisplaced     = splint, Ortho follow-up after hospitalization--- ongoing discomfort in right hand   -Right first MTP pain likely gout. xrays negative     4. Mood: Team to provide encouragement/ego support. LCSW to follow for evaluation when appropriate.  5. Alzheimer's dementia/ Neuropsych: This patient is not capable of making decisions on his own behalf. Continue Namenda    -continue methylphenidate  10mg  bid to optimize  arousal and initiation---? benefit 6. Skin/Wound Care: Pressure relief measures. Maintain adequate nutritional and hydration status.  7. Fluids/Electrolytes/Nutrition: poor intake   - PEG placed 08/13/17.     -Tolerating TF   -Free water flushes through PEG increase to 250 qid    -BUN/Cr holding at 31/1.04---no changes at present---  8. T2DM: Monitor BS ac/hs. Continue SSI.    -cbg's increased on TF   -began low dose amaryl 1mg  daily and sugars bottomed out---have dc'ed with [facility] (last 3)  Recent Labs    08/24/17 1644 08/24/17 2117 08/25/17 0643  GLUCAP 122* 145* 116*   9. HTN: Monitor BP bid-       Vitals:   08/24/17 1515 08/25/17 0443  BP: (!) 157/73 130/72  Pulse: 79 94  Resp: 20 18  Temp: 98.4 F (36.9 C) 98.4 F (36.9 C)  SpO2: 98% 95%    Metoprolol XL to 25 mg daily on 3/6 decreased to 12.5 on 3/10 to avoid hypoperfusion--BP's had been well controlled but with noticeable increase this morning.   10. CKD stage III:  .  Cr  1.04 11. Dyslipidemia: now on Lipitor.   12. Morbid obesity             Body mass index is 38.22 kg/m.             Diet and exercise education 13. Acute blood loss anemia   Hemoglobin 11.9  3/12   -Hemoglobin 11.8 14.  Bowel incontinence:  Continue to address with nursing team 15. Bladder: incontinent  16. Cough: Improved   -Continue Robitussin scheduled--- changed to as needed   -Aspiration precautions     LOS (Days) 28 A FACE TO FACE EVALUATION WAS PERFORMED  Ranelle Oyster 08/25/2017 8:37 AM

## 2017-08-25 NOTE — Progress Notes (Signed)
Pt discharged to a SNF at 11am. Report called and given to nurse at facility. Belongings with pt.

## 2017-08-25 NOTE — Progress Notes (Signed)
Social Work Discharge Note  The overall goal for the admission was met for:   Discharge location: No - pt required SNF transfer prior to family caring for him at home  Length of Stay: Yes - 28 days  Discharge activity level: No - total assist  Home/community participation: No  Services provided included: MD, RD, PT, OT, SLP, RN, Pharmacy and [place]: Private Insurance: [employer]  Follow-up services arranged: Other: Pt required SNF care to continue his rehabilitation.  Comments (or additional information):  Pt transported via PTAR (non-emergent ambulance) to SNF.  Family is aware and will meet pt there.  Copies of pt's chart have been shared with the facility for continuity of care.  Patient/Family verbalized understanding of follow-up arrangements: Yes  Individual responsible for coordination of the follow-up plan: pt's wife and son  Confirmed correct DME delivered: Trey Sailors 08/25/2017    Berkley Wrightsman, Silvestre Mesi

## 2017-09-24 ENCOUNTER — Telehealth: Payer: Self-pay | Admitting: Family Medicine

## 2017-09-24 NOTE — Telephone Encounter (Signed)
I spoke with [name] son [name] and he states it will be over a year before his father can come in and have the event monitor put on.   He said that his father is in a nursing facility and bed ridden.   Please advise on how you would like this handled.  Thank you  [name]

## 2017-09-29 ENCOUNTER — Encounter: Payer: Medicare Other | Attending: Physical Medicine & Rehabilitation | Admitting: Physical Medicine & Rehabilitation

## 2017-11-04 ENCOUNTER — Ambulatory Visit: Payer: Medicare Other | Admitting: Neurology

## 2017-12-23 ENCOUNTER — Other Ambulatory Visit: Payer: Self-pay

## 2017-12-23 ENCOUNTER — Emergency Department: Payer: Medicare Other

## 2017-12-23 ENCOUNTER — Inpatient Hospital Stay
Admission: EM | Admit: 2017-12-23 | Discharge: 2018-01-03 | DRG: 872 | Disposition: A | Discharge status: Home Health | Payer: Medicare Other | Attending: Specialist | Admitting: Specialist

## 2017-12-23 DIAGNOSIS — E876 Hypokalemia: Secondary | ICD-10-CM | POA: Diagnosis not present

## 2017-12-23 DIAGNOSIS — H409 Unspecified glaucoma: Secondary | ICD-10-CM | POA: Diagnosis present

## 2017-12-23 DIAGNOSIS — F0150 Vascular dementia without behavioral disturbance: Secondary | ICD-10-CM | POA: Diagnosis present

## 2017-12-23 DIAGNOSIS — R6520 Severe sepsis without septic shock: Secondary | ICD-10-CM | POA: Diagnosis present

## 2017-12-23 DIAGNOSIS — Z8673 Personal history of transient ischemic attack (TIA), and cerebral infarction without residual deficits: Secondary | ICD-10-CM | POA: Diagnosis not present

## 2017-12-23 DIAGNOSIS — B9620 Unspecified Escherichia coli [E. coli] as the cause of diseases classified elsewhere: Secondary | ICD-10-CM | POA: Diagnosis present

## 2017-12-23 DIAGNOSIS — N179 Acute kidney failure, unspecified: Secondary | ICD-10-CM | POA: Diagnosis present

## 2017-12-23 DIAGNOSIS — Z6834 Body mass index (BMI) 34.0-34.9, adult: Secondary | ICD-10-CM

## 2017-12-23 DIAGNOSIS — E870 Hyperosmolality and hypernatremia: Secondary | ICD-10-CM | POA: Diagnosis not present

## 2017-12-23 DIAGNOSIS — D631 Anemia in chronic kidney disease: Secondary | ICD-10-CM | POA: Diagnosis present

## 2017-12-23 DIAGNOSIS — N390 Urinary tract infection, site not specified: Secondary | ICD-10-CM | POA: Diagnosis present

## 2017-12-23 DIAGNOSIS — E669 Obesity, unspecified: Secondary | ICD-10-CM | POA: Diagnosis present

## 2017-12-23 DIAGNOSIS — R1312 Dysphagia, oropharyngeal phase: Secondary | ICD-10-CM | POA: Diagnosis present

## 2017-12-23 DIAGNOSIS — E1122 Type 2 diabetes mellitus with diabetic chronic kidney disease: Secondary | ICD-10-CM | POA: Diagnosis present

## 2017-12-23 DIAGNOSIS — Z931 Gastrostomy status: Secondary | ICD-10-CM

## 2017-12-23 DIAGNOSIS — Z7401 Bed confinement status: Secondary | ICD-10-CM

## 2017-12-23 DIAGNOSIS — Z79899 Other long term (current) drug therapy: Secondary | ICD-10-CM

## 2017-12-23 DIAGNOSIS — Z833 Family history of diabetes mellitus: Secondary | ICD-10-CM

## 2017-12-23 DIAGNOSIS — N183 Chronic kidney disease, stage 3 (moderate): Secondary | ICD-10-CM | POA: Diagnosis present

## 2017-12-23 DIAGNOSIS — Z7189 Other specified counseling: Secondary | ICD-10-CM

## 2017-12-23 DIAGNOSIS — Z7982 Long term (current) use of aspirin: Secondary | ICD-10-CM

## 2017-12-23 DIAGNOSIS — I959 Hypotension, unspecified: Secondary | ICD-10-CM | POA: Diagnosis present

## 2017-12-23 DIAGNOSIS — K810 Acute cholecystitis: Secondary | ICD-10-CM | POA: Diagnosis present

## 2017-12-23 DIAGNOSIS — G309 Alzheimer's disease, unspecified: Secondary | ICD-10-CM | POA: Diagnosis present

## 2017-12-23 DIAGNOSIS — Z7902 Long term (current) use of antithrombotics/antiplatelets: Secondary | ICD-10-CM | POA: Diagnosis not present

## 2017-12-23 DIAGNOSIS — I700 Atherosclerosis of aorta: Secondary | ICD-10-CM | POA: Diagnosis present

## 2017-12-23 DIAGNOSIS — F0280 Dementia in other diseases classified elsewhere without behavioral disturbance: Secondary | ICD-10-CM | POA: Diagnosis not present

## 2017-12-23 DIAGNOSIS — R188 Other ascites: Secondary | ICD-10-CM

## 2017-12-23 DIAGNOSIS — I129 Hypertensive chronic kidney disease with stage 1 through stage 4 chronic kidney disease, or unspecified chronic kidney disease: Secondary | ICD-10-CM | POA: Diagnosis present

## 2017-12-23 DIAGNOSIS — E785 Hyperlipidemia, unspecified: Secondary | ICD-10-CM | POA: Diagnosis present

## 2017-12-23 DIAGNOSIS — E860 Dehydration: Secondary | ICD-10-CM | POA: Diagnosis present

## 2017-12-23 DIAGNOSIS — Z515 Encounter for palliative care: Secondary | ICD-10-CM | POA: Diagnosis not present

## 2017-12-23 DIAGNOSIS — A419 Sepsis, unspecified organism: Principal | ICD-10-CM

## 2017-12-23 DIAGNOSIS — E1169 Type 2 diabetes mellitus with other specified complication: Secondary | ICD-10-CM | POA: Diagnosis present

## 2017-12-23 DIAGNOSIS — I10 Essential (primary) hypertension: Secondary | ICD-10-CM | POA: Diagnosis present

## 2017-12-23 LAB — COMPREHENSIVE METABOLIC PANEL
ALT: 13 U/L (ref 0–44)
AST: 27 U/L (ref 15–41)
Albumin: 2.9 g/dL — ABNORMAL LOW (ref 3.5–5.0)
Alkaline Phosphatase: 67 U/L (ref 38–126)
Anion gap: 11 (ref 5–15)
BILIRUBIN TOTAL: 1 mg/dL (ref 0.3–1.2)
BUN: 30 mg/dL — AB (ref 8–23)
CO2: 25 mmol/L (ref 22–32)
Calcium: 9.1 mg/dL (ref 8.9–10.3)
Chloride: 106 mmol/L (ref 98–111)
Creatinine, Ser: 1.57 mg/dL — ABNORMAL HIGH (ref 0.61–1.24)
GFR, EST AFRICAN AMERICAN: 47 mL/min — AB (ref >60)
GFR, EST NON AFRICAN AMERICAN: 41 mL/min — AB (ref >60)
Glucose, Bld: 211 mg/dL — ABNORMAL HIGH (ref 70–99)
POTASSIUM: 4 mmol/L (ref 3.5–5.1)
Sodium: 142 mmol/L (ref 135–145)
TOTAL PROTEIN: 7.4 g/dL (ref 6.5–8.1)

## 2017-12-23 LAB — CBC WITH DIFFERENTIAL/PLATELET
Basophils Absolute: 0.1 10*3/uL (ref 0–0.1)
Basophils Relative: 0 %
EOS ABS: 0 10*3/uL (ref 0–0.7)
Eosinophils Relative: 0 %
HEMATOCRIT: 39.3 % — AB (ref 40.0–52.0)
Hemoglobin: 12.8 g/dL — ABNORMAL LOW (ref 13.0–18.0)
LYMPHS PCT: 8 %
Lymphs Abs: 2.5 10*3/uL (ref 1.0–3.6)
MCH: 26.5 pg (ref 26.0–34.0)
MCHC: 32.5 g/dL (ref 32.0–36.0)
MCV: 81.6 fL (ref 80.0–100.0)
MONO ABS: 1.3 10*3/uL — AB (ref 0.2–1.0)
Monocytes Relative: 4 %
Neutro Abs: 27.6 10*3/uL — ABNORMAL HIGH (ref 1.4–6.5)
Neutrophils Relative %: 88 %
Platelets: 353 10*3/uL (ref 150–440)
RBC: 4.81 MIL/uL (ref 4.40–5.90)
RDW: 16.8 % — AB (ref 11.5–14.5)
WBC: 31.5 10*3/uL — AB (ref 3.8–10.6)

## 2017-12-23 LAB — URINALYSIS, COMPLETE (UACMP) WITH MICROSCOPIC
Bilirubin Urine: NEGATIVE
GLUCOSE, UA: NEGATIVE mg/dL
KETONES UR: NEGATIVE mg/dL
Nitrite: NEGATIVE
PH: 5 (ref 5.0–8.0)
Protein, ur: NEGATIVE mg/dL
SQUAMOUS EPITHELIAL / LPF: NONE SEEN (ref 0–5)
Specific Gravity, Urine: 1.013 (ref 1.005–1.030)

## 2017-12-23 LAB — LIPASE, BLOOD: LIPASE: 20 U/L (ref 11–51)

## 2017-12-23 LAB — GLUCOSE, CAPILLARY: GLUCOSE-CAPILLARY: 150 mg/dL — AB (ref 70–99)

## 2017-12-23 LAB — LACTIC ACID, PLASMA: LACTIC ACID, VENOUS: 2.2 mmol/L — AB (ref 0.5–1.9)

## 2017-12-23 MED ORDER — ONDANSETRON HCL 4 MG/2ML IJ SOLN
4.0000 mg | Freq: Once | INTRAMUSCULAR | Status: AC
  Administered 2017-12-23: 4 mg via INTRAVENOUS
  Filled 2017-12-23: qty 2

## 2017-12-23 MED ORDER — METOPROLOL SUCCINATE ER 25 MG PO TB24
12.5000 mg | ORAL_TABLET | Freq: Every day | ORAL | Status: DC
  Administered 2017-12-24: 12.5 mg via ORAL
  Filled 2017-12-23 (×2): qty 1

## 2017-12-23 MED ORDER — CLOPIDOGREL BISULFATE 75 MG PO TABS
75.0000 mg | ORAL_TABLET | Freq: Every day | ORAL | Status: DC
  Administered 2017-12-24 – 2017-12-25 (×2): 75 mg via ORAL
  Filled 2017-12-23 (×2): qty 1

## 2017-12-23 MED ORDER — ACETAMINOPHEN 325 MG PO TABS: 650.0000 mg | ORAL_TABLET | Freq: Four times a day (QID) | ORAL | Status: DC | PRN

## 2017-12-23 MED ORDER — SODIUM CHLORIDE 0.9 % IV SOLN
1.0000 g | Freq: Three times a day (TID) | INTRAVENOUS | Status: DC
  Administered 2017-12-23 – 2017-12-25 (×5): 1 g via INTRAVENOUS
  Filled 2017-12-23 (×7): qty 1

## 2017-12-23 MED ORDER — OXYCODONE HCL 5 MG PO TABS
5.0000 mg | ORAL_TABLET | ORAL | Status: DC | PRN
  Administered 2017-12-25 – 2017-12-31 (×5): 5 mg via ORAL
  Filled 2017-12-23 (×5): qty 1

## 2017-12-23 MED ORDER — ENOXAPARIN SODIUM 40 MG/0.4ML ~~LOC~~ SOLN
40.0000 mg | SUBCUTANEOUS | Status: DC
  Administered 2017-12-24: 40 mg via SUBCUTANEOUS
  Filled 2017-12-23: qty 0.4

## 2017-12-23 MED ORDER — LATANOPROST 0.005 % OP SOLN
1.0000 [drp] | Freq: Every day | OPHTHALMIC | Status: DC
  Administered 2017-12-23 – 2018-01-02 (×11): 1 [drp] via OPHTHALMIC
  Filled 2017-12-23: qty 2.5

## 2017-12-23 MED ORDER — SODIUM CHLORIDE 0.9 % IV SOLN
1.0000 g | Freq: Once | INTRAVENOUS | Status: AC
  Administered 2017-12-23: 1 g via INTRAVENOUS
  Filled 2017-12-23: qty 10

## 2017-12-23 MED ORDER — ONDANSETRON HCL 4 MG/2ML IJ SOLN: 4.0000 mg | Freq: Four times a day (QID) | INTRAMUSCULAR | Status: DC | PRN

## 2017-12-23 MED ORDER — SODIUM CHLORIDE 0.9 % IV BOLUS
1000.0000 mL | Freq: Once | INTRAVENOUS | Status: AC
  Administered 2017-12-23: 1000 mL via INTRAVENOUS

## 2017-12-23 MED ORDER — SODIUM CHLORIDE 0.9 % IV SOLN
INTRAVENOUS | Status: AC
  Administered 2017-12-23: via INTRAVENOUS

## 2017-12-23 MED ORDER — ASPIRIN EC 81 MG PO TBEC
81.0000 mg | DELAYED_RELEASE_TABLET | Freq: Every day | ORAL | Status: DC
  Administered 2017-12-24 – 2017-12-25 (×2): 81 mg via ORAL
  Filled 2017-12-23 (×2): qty 1

## 2017-12-23 MED ORDER — VANCOMYCIN HCL IN DEXTROSE 1-5 GM/200ML-% IV SOLN
1000.0000 mg | Freq: Once | INTRAVENOUS | Status: AC
  Administered 2017-12-23: 1000 mg via INTRAVENOUS
  Filled 2017-12-23: qty 200

## 2017-12-23 MED ORDER — ONDANSETRON HCL 4 MG PO TABS
4.0000 mg | ORAL_TABLET | Freq: Four times a day (QID) | ORAL | Status: DC | PRN
Start: 2017-12-23 — End: 2018-01-03
  Administered 2017-12-30: 4 mg via ORAL
  Filled 2017-12-23 (×2): qty 1

## 2017-12-23 MED ORDER — INSULIN ASPART 100 UNIT/ML ~~LOC~~ SOLN
0.0000 [IU] | Freq: Four times a day (QID) | SUBCUTANEOUS | Status: DC
  Administered 2017-12-23 – 2017-12-29 (×11): 1 [IU] via SUBCUTANEOUS
  Administered 2017-12-30: 2 [IU] via SUBCUTANEOUS
  Administered 2018-01-01 – 2018-01-03 (×5): 1 [IU] via SUBCUTANEOUS
  Filled 2017-12-23 (×17): qty 1

## 2017-12-23 MED ORDER — VANCOMYCIN HCL IN DEXTROSE 1-5 GM/200ML-% IV SOLN
1000.0000 mg | Freq: Two times a day (BID) | INTRAVENOUS | Status: DC
Start: 2017-12-24 — End: 2017-12-25
  Administered 2017-12-24 – 2017-12-25 (×3): 1000 mg via INTRAVENOUS
  Filled 2017-12-23 (×4): qty 200

## 2017-12-23 MED ORDER — IOHEXOL 300 MG/ML  SOLN
75.0000 mL | Freq: Once | INTRAMUSCULAR | Status: AC | PRN
  Administered 2017-12-23: 75 mL via INTRAVENOUS

## 2017-12-23 MED ORDER — ACETAMINOPHEN 650 MG RE SUPP: 650.0000 mg | Freq: Four times a day (QID) | RECTAL | Status: DC | PRN

## 2017-12-23 MED ORDER — ATORVASTATIN CALCIUM 20 MG PO TABS
80.0000 mg | ORAL_TABLET | Freq: Every day | ORAL | Status: DC
  Administered 2017-12-24 – 2018-01-02 (×10): 80 mg via ORAL
  Filled 2017-12-23 (×9): qty 4

## 2017-12-23 NOTE — ED Provider Notes (Signed)
[facility] Emergency Department Provider Note  ____________________________________________   First MD Initiated Contact with Patient 12/23/17 1912     (approximate)  I have reviewed the triage vital signs and the nursing notes.   HISTORY  Chief Complaint Emesis  Level 5 exemption history limited by the patient's dementia  HPI Noah Eguia. is a 77 y.o. male who comes to the emergency department via EMS with nausea vomiting and abdominal pain that began last night.  The patient has a complex past medical history including Alzheimer's and vascular dementia along with a recent right sided MCA stroke 4 months ago.  He is on aspirin and Plavix.  He does have a G-tube.  He says he has been unable to keep any food down and he has severe cramping abdominal pain.    Past Medical History:  Diagnosis Date  . Alzheimer's dementia    [name] 07/23/2017  . Cerebral infarction (HCC) 07/27/2017  . Cerebrovascular accident (CVA) (HCC) 07/23/2017  . CKD (chronic kidney disease), stage III (HCC)    [name] 07/23/2017  . Gout   . Hypertension     Patient Active Problem List   Diagnosis Date Noted  . Severe sepsis (HCC) 12/23/2017  . AKI (acute kidney injury) (HCC) 12/23/2017  . UTI (urinary tract infection) 12/23/2017  . Acute cholecystitis 12/23/2017  . Cough   . Hypotension due to drugs   . Finger pain, right   . Poor nutrition   . Acute blood loss anemia   . 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 Acute ischemic right MCA stroke (HCC) 07/28/2017  . Diabetes mellitus type 2 in obese (HCC)   . Stage 3 chronic kidney disease (HCC)   . Morbid obesity (HCC)   . Cerebral infarction (HCC) 07/27/2017  . Hyperlipidemia   . Essential hypertension   . 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 Asymmetry of cerebral ventricles, Acute, Acquired  07/23/2017  . Encounter for screening colonoscopy 03/12/2015    Past Surgical History:    Procedure Laterality Date  . COLONOSCOPY WITH PROPOFOL N/A 05/09/2015   Procedure: COLONOSCOPY WITH PROPOFOL;  Surgeon: [name], MD;  Location: [facility] ENDOSCOPY;  Service: Endoscopy;  Laterality: N/A;  . IR GASTROSTOMY TUBE MOD SED  08/13/2017  . TIBIA FRACTURE SURGERY Left     Prior to Admission medications   Medication Sig Start Date End Date Taking? Authorizing Provider  acetaminophen (TYLENOL) 325 MG tablet Take 1-2 tablets (325-650 mg total) by mouth every 4 (four) hours as needed for mild pain. 08/24/17   Love, Evlyn Kanner, PA-C  albuterol (PROVENTIL) (2.5 MG/3ML) 0.083% nebulizer solution Take 3 mLs (2.5 mg total) by nebulization every 6 (six) hours as needed for wheezing or shortness of breath. 08/24/17   Love, Evlyn Kanner, PA-C  Amino Acids-Protein Hydrolys (FEEDING SUPPLEMENT, PRO-STAT SUGAR FREE 64,) LIQD Place 30 mLs into feeding tube 3 (three) times daily between meals. 08/24/17   Love, Evlyn Kanner, PA-C  aspirin EC 81 MG tablet Take 81 mg by mouth daily.    [provider]  atorvastatin (LIPITOR) 80 MG tablet Take 1 tablet (80 mg total) by mouth daily at 6 PM. 07/28/17   Garnette Gunner, MD  clopidogrel (PLAVIX) 75 MG tablet Take 1 tablet (75 mg total) by mouth daily. 08/25/17   Love, Evlyn Kanner, PA-C  latanoprost (XALATAN) 0.005 % ophthalmic solution Place 1 drop into the left eye at bedtime. 08/24/17   Jacquelynn Cree, PA-C  memantine (NAMENDA) 5 MG tablet Take 5 mg by mouth daily.    [provider]  Menthol-Methyl Salicylate (MUSCLE RUB) 10-15 % CREA Apply 1 application topically 4 (four) times daily. To bilateral hands and bilateral knees 08/24/17   Love, Evlyn KannerPamela S, PA-C  methylphenidate (RITALIN) 5 MG tablet Take 1 tablet (5 mg total) by mouth 2 (two) times daily with breakfast and lunch. 08/24/17   Love, Evlyn KannerPamela S, PA-C  metoprolol succinate (TOPROL-XL) 25 MG 24 hr tablet Take 0.5 tablets (12.5 mg total) by mouth daily. 07/29/17   Garnette Gunnerhompson, Aaron B, MD  multivitamin  (RENA-VIT) TABS tablet Take 1 tablet by mouth at bedtime. 08/24/17   Love, Evlyn KannerPamela S, PA-C  Nutritional Supplements (FEEDING SUPPLEMENT, OSMOLITE 1.5 CAL,) LIQD Place 280 mLs into feeding tube 4 (four) times daily - after meals and at bedtime. 08/24/17   Love, Evlyn KannerPamela S, PA-C  polyethylene glycol (MIRALAX / GLYCOLAX) packet Take 17 g by mouth 2 (two) times daily. 08/24/17   Love, Evlyn KannerPamela S, PA-C  traZODone (DESYREL) 50 MG tablet Take 0.5-1 tablets (25-50 mg total) by mouth at bedtime as needed for sleep. 08/24/17   Love, Evlyn KannerPamela S, PA-C  Water For Irrigation, Sterile (FREE WATER) SOLN Place 200 mLs into feeding tube 4 (four) times daily. 08/24/17   Love, Evlyn KannerPamela S, PA-C  Water For Irrigation, Sterile (FREE WATER) SOLN Place 250 mLs into feeding tube 4 (four) times daily. 08/25/17   Jacquelynn CreeLove, Pamela S, PA-C    Allergies Patient has no known allergies.  Family History  Problem Relation Age of Onset  . Diabetes Paternal Grandfather     Social History Social History   Tobacco Use  . Smoking status: Never Smoker  . Smokeless tobacco: Never Used  Substance Use Topics  . Alcohol use: Yes    Alcohol/week: 1.2 oz    Types: 2 Cans of beer per week  . Drug use: No    Review of Systems Level 5 exemption history limited by the patient's dementia ____________________________________________   PHYSICAL EXAM:  VITAL SIGNS: ED Triage Vitals  Enc Vitals Group     BP      Pulse      Resp      Temp      Temp src      SpO2      Weight      Height      Head Circumference      Peak Flow      Pain Score      Pain Loc      Pain Edu?      Excl. in GC?     Constitutional: Patient has left-sided neglect.  Appears uncomfortable holding his abdomen saying "it hurts" Eyes: PERRL EOMI. Head: Atraumatic. Nose: No congestion/rhinnorhea. Mouth/Throat: No trismus Neck: No stridor.   Cardiovascular: Tachycardic rate, regular rhythm. Grossly normal heart sounds.  Good peripheral circulation. Respiratory:  Increased respiratory effort.  No retractions. Lungs CTAB and moving good air Gastrointestinal: Distended abdomen.  G-tube in place.  Diffusely tender with no frank peritonitis Musculoskeletal: No lower extremity edema   Neurologic: Able to move all 4 extremities Skin:  Skin is warm, dry and intact. No rash noted. Psychiatric: Significant dementia    ____________________________________________   DIFFERENTIAL includes but not limited to  Bowel obstruction, urinary tract infection, pyelonephritis, sepsis ____________________________________________   LABS (all labs ordered are listed, but only abnormal results are displayed)  Labs Reviewed  LACTIC ACID, PLASMA - Abnormal; Notable for the  following components:      Result Value   Lactic Acid, Venous 2.2 (*)    All other components within normal limits  CBC WITH DIFFERENTIAL/PLATELET - Abnormal; Notable for the following components:   WBC 31.5 (*)    Hemoglobin 12.8 (*)    HCT 39.3 (*)    RDW 16.8 (*)    Neutro Abs 27.6 (*)    Monocytes Absolute 1.3 (*)    All other components within normal limits  COMPREHENSIVE METABOLIC PANEL - Abnormal; Notable for the following components:   Glucose, Bld 211 (*)    BUN 30 (*)    Creatinine, Ser 1.57 (*)    Albumin 2.9 (*)    GFR calc non Af Amer 41 (*)    GFR calc Af Amer 47 (*)    All other components within normal limits  URINALYSIS, COMPLETE (UACMP) WITH MICROSCOPIC - Abnormal; Notable for the following components:   Color, Urine YELLOW (*)    APPearance HAZY (*)    Hgb urine dipstick SMALL (*)    Leukocytes, UA MODERATE (*)    Bacteria, UA MANY (*)    All other components within normal limits  URINE CULTURE  CULTURE, BLOOD (ROUTINE X 2)  CULTURE, BLOOD (ROUTINE X 2)  LIPASE, BLOOD  LACTIC ACID, PLASMA    Lab work reviewed by me most notable for a white count of 31 concerning for significant bacterial infection __________________________________________  EKG  ED ECG  REPORT I, Merrily Brittle, the attending physician, personally viewed and interpreted this ECG.  Date: 12/23/2017 EKG Time:  Rate: 107 Rhythm: Sinus tachycardia QRS Axis: Leftward axis Intervals: Prolonged QTC ST/T Wave abnormalities: normal Narrative Interpretation: no evidence of acute ischemia  ____________________________________________  RADIOLOGY  CT abdomen pelvis reviewed by me consistent with severe cholecystitis ____________________________________________   PROCEDURES  Procedure(s) performed: no  .Critical Care Performed by: Merrily Brittle, MD Authorized by: Merrily Brittle, MD   Critical care provider statement:    Critical care time (minutes):  30   Critical care time was exclusive of:  Separately billable procedures and treating other patients   Critical care was necessary to treat or prevent imminent or life-threatening deterioration of the following conditions:  Sepsis   Critical care was time spent personally by me on the following activities:  Development of treatment plan with patient or surrogate, discussions with consultants, evaluation of patient's response to treatment, examination of patient, obtaining history from patient or surrogate, ordering and performing treatments and interventions, ordering and review of laboratory studies, ordering and review of radiographic studies, pulse oximetry, re-evaluation of patient's condition and review of old charts    Critical Care performed: Yes  ____________________________________________   INITIAL IMPRESSION / ASSESSMENT AND PLAN / ED COURSE  Pertinent labs & imaging results that were available during my care of the patient were reviewed by me and considered in my medical decision making (see chart for details).   As part of my medical decision making, I reviewed the following data within the electronic MEDICAL RECORD NUMBER History obtained from family if available, nursing notes, old chart and ekg, as well as  notes from prior ED visits.  The patient arrives tachycardic and quite uncomfortable appearing with abdominal pain nausea and vomiting.  History is very difficult to obtain but differential is broad.  He requires a CT scan with IV contrast.    Prior to CT the patient's white count came back at 31 with urine concerning for possible infection.  I will  check blood cultures, lactic acid, activated septic alert, and treat him with 1 g of ceftriaxone for now. ----------------------------------------- 9:11 PM on 12/23/2017 ----------------------------------------- The patient CT scan is being read as severe cholecystitis.  I touched base with on-call general surgeon Dr. Maia Plan who felt the patient is a poor surgical candidate given his comorbidities and anticoagulation and that he would be best served with a percutaneous tube.  I then spoke with Dr. Eustaquio Maize of interventional radiology who will kindly come consult on the patient.  I then spoke with Dr. Anne Hahn of the hospitalist service who has graciously agreed to admit the patient to his service. ____________________________________________   FINAL CLINICAL IMPRESSION(S) / ED DIAGNOSES  Final diagnoses:  Acute cholecystitis  Sepsis, due to unspecified organism [employer])      NEW MEDICATIONS STARTED DURING THIS VISIT:  New Prescriptions   No medications on file     Note:  This document was prepared using Dragon voice recognition software and may include unintentional dictation errors.     Merrily Brittle, MD 12/23/17 [phone]

## 2017-12-23 NOTE — ED Notes (Signed)
Report called to RN on floor and pt to be transported to floor bed. VSS.

## 2017-12-23 NOTE — ED Notes (Signed)
Patient transported to room 222 by this EDT.

## 2017-12-23 NOTE — ED Triage Notes (Signed)
[facility]. From home. Denies CP/SOB. Has peg tube but he does eat by mouth. Hx CVA 6 months ago.

## 2017-12-23 NOTE — Consult Note (Signed)
Pharmacy Antibiotic Note  Noah RumpfJames Tucholski Jr. is a 77 y.o. male admitted on 12/23/2017 with sepsis.  Pharmacy has been consulted for Vancomycin and Meropenem dosing. Patient received vancomycin 1g IV and Ceftriaxone 1g IV in ED.   Plan: Start Vancomycin 1000 IV every 12 hours with 6 hour stack dosing.  Goal trough 15-20 mcg/mL. Calculated trough at Css is 15.5. Trough level ordered prior to 4th dose. Will monitor renal function and adjust dose as needed.   Start meropenem 1g IV every 8 hours.   Height: 5\' 11"  (180.3 cm) Weight: 250 lb (113.4 kg) IBW/kg (Calculated) : 75.3  Temp (24hrs), Avg:98.1 F (36.7 C), Min:98.1 F (36.7 C), Max:98.1 F (36.7 C)  Recent Labs  Lab 12/23/17 1923 12/23/17 1924  WBC 31.5*  --   CREATININE 1.57*  --   LATICACIDVEN  --  2.2*    Estimated Creatinine Clearance: 50.4 mL/min (A) (by C-G formula based on SCr of 1.57 mg/dL (H)).    No Known Allergies  Antimicrobials this admission: 7/24 Ceftriaxone >> x 1 dose  7/24 vancomycin >>  7/24 Meropenem    Microbiology results: 7/24 BCx: pending  7/24 UCx: pending  Thank you for allowing pharmacy to be a part of this patient's care.  Gardner CandleSheema M Abelina Ketron, PharmD, BCPS Clinical Pharmacist 12/23/2017 9:56 PM

## 2017-12-23 NOTE — H&P (Signed)
[facility] - [employer] at [facility]   PATIENT NAME: Noah RilingJames Bush    MR#:  [id number]  DATE OF BIRTH:  [birth year]  DATE OF ADMISSION:  12/23/2017  PRIMARY CARE PHYSICIAN: Garnet KoyanagiJohnson, Julie R, FNP   REQUESTING/REFERRING PHYSICIAN: Lamont Snowballifenbark, MD  CHIEF COMPLAINT:   Chief Complaint  Patient presents with  . Emesis    HISTORY OF PRESENT ILLNESS:  Noah Bush  is a 77 y.o. male who presents with vomiting.  Patient has history of prior stroke and dementia and is unable to contribute much information to his history.  He was found here in the ED on imaging to have acute cholecystitis, and meet sepsis criteria.  He also likely has a UTI.  He was started on antibiotics.  Surgery reviewed his case and feels that he may not be a good surgical candidate, though they will assess the patient for the same.  Hospitalist were called for admission  PAST MEDICAL HISTORY:   Past Medical History:  Diagnosis Date  . Alzheimer's dementia    [name]/notes 07/23/2017  . Cerebral infarction (HCC) 07/27/2017  . Cerebrovascular accident (CVA) (HCC) 07/23/2017  . CKD (chronic kidney disease), stage III (HCC)    [name]/notes 07/23/2017  . Gout   . Hypertension      PAST SURGICAL HISTORY:   Past Surgical History:  Procedure Laterality Date  . COLONOSCOPY WITH PROPOFOL N/A 05/09/2015   Procedure: COLONOSCOPY WITH PROPOFOL;  Surgeon: Earline MayotteJeffrey W Byrnett, MD;  [facility];  Service: Endoscopy;  Laterality: N/A;  . IR GASTROSTOMY TUBE MOD SED  08/13/2017  . TIBIA FRACTURE SURGERY Left      SOCIAL HISTORY:   Social History   Tobacco Use  . Smoking status: Never Smoker  . Smokeless tobacco: Never Used  Substance Use Topics  . Alcohol use: Yes    Alcohol/week: 1.2 oz    Types: 2 Cans of beer per week     FAMILY HISTORY:   Family History  Problem Relation Age of Onset  . Diabetes Paternal Grandfather      DRUG ALLERGIES:  No Known Allergies  MEDICATIONS AT HOME:    Prior to Admission medications   Medication Sig Start Date End Date Taking? Authorizing Provider  acetaminophen (TYLENOL) 325 MG tablet Take 1-2 tablets (325-650 mg total) by mouth every 4 (four) hours as needed for mild pain. 08/24/17   Love, Evlyn KannerPamela S, PA-C  albuterol (PROVENTIL) (2.5 MG/3ML) 0.083% nebulizer solution Take 3 mLs (2.5 mg total) by nebulization every 6 (six) hours as needed for wheezing or shortness of breath. 08/24/17   Love, Evlyn KannerPamela S, PA-C  Amino Acids-Protein Hydrolys (FEEDING SUPPLEMENT, PRO-STAT SUGAR FREE 64,) LIQD Place 30 mLs into feeding tube 3 (three) times daily between meals. 08/24/17   Love, Evlyn KannerPamela S, PA-C  aspirin EC 81 MG tablet Take 81 mg by mouth daily.    [provider]  atorvastatin (LIPITOR) 80 MG tablet Take 1 tablet (80 mg total) by mouth daily at 6 PM. 07/28/17   Garnette Gunnerhompson, Aaron B, MD  clopidogrel (PLAVIX) 75 MG tablet Take 1 tablet (75 mg total) by mouth daily. 08/25/17   Love, Evlyn KannerPamela S, PA-C  latanoprost (XALATAN) 0.005 % ophthalmic solution Place 1 drop into the left eye at bedtime. 08/24/17   Love, Evlyn KannerPamela S, PA-C  memantine (NAMENDA) 5 MG tablet Take 5 mg by mouth daily.    [provider]  Menthol-Methyl Salicylate (MUSCLE RUB) 10-15 % CREA Apply 1 application topically 4 (four) times  daily. To bilateral hands and bilateral knees 08/24/17   Love, Evlyn Kanner, PA-C  methylphenidate (RITALIN) 5 MG tablet Take 1 tablet (5 mg total) by mouth 2 (two) times daily with breakfast and lunch. 08/24/17   Love, Evlyn Kanner, PA-C  metoprolol succinate (TOPROL-XL) 25 MG 24 hr tablet Take 0.5 tablets (12.5 mg total) by mouth daily. 07/29/17   Garnette Gunner, MD  multivitamin (RENA-VIT) TABS tablet Take 1 tablet by mouth at bedtime. 08/24/17   Love, Evlyn Kanner, PA-C  Nutritional Supplements (FEEDING SUPPLEMENT, OSMOLITE 1.5 CAL,) LIQD Place 280 mLs into feeding tube 4 (four) times daily - after meals and at bedtime. 08/24/17   Love, Evlyn Kanner, PA-C  polyethylene  glycol (MIRALAX / GLYCOLAX) packet Take 17 g by mouth 2 (two) times daily. 08/24/17   Love, Evlyn Kanner, PA-C  traZODone (DESYREL) 50 MG tablet Take 0.5-1 tablets (25-50 mg total) by mouth at bedtime as needed for sleep. 08/24/17   Love, Evlyn Kanner, PA-C  Water For Irrigation, Sterile (FREE WATER) SOLN Place 200 mLs into feeding tube 4 (four) times daily. 08/24/17   Love, Evlyn Kanner, PA-C  Water For Irrigation, Sterile (FREE WATER) SOLN Place 250 mLs into feeding tube 4 (four) times daily. 08/25/17   Jacquelynn Cree, PA-C    REVIEW OF SYSTEMS:  Review of Systems  Unable to perform ROS: Dementia     VITAL SIGNS:   Vitals:   12/23/17 1918 12/23/17 1930 12/23/17 1940 12/23/17 2000  BP:    106/70  Pulse:  (!) 107 (!) 103 (!) 103  Resp:  (!) 21 (!) 21   Temp:      TempSrc:      SpO2:  93% 94% 92%  Weight: 113.4 kg (250 lb)     Height: 5\' 11"  (1.803 m)      Wt Readings from Last 3 Encounters:  12/23/17 113.4 kg (250 lb)  08/25/17 116 kg (255 lb 11.7 oz)  07/23/17 124.3 kg (274 lb)    PHYSICAL EXAMINATION:  Physical Exam  Vitals reviewed. Constitutional: He appears well-developed and well-nourished. No distress.  HENT:  Head: Normocephalic and atraumatic.  Dry mucous membranes  Eyes: Pupils are equal, round, and reactive to light. Conjunctivae and EOM are normal. No scleral icterus.  Neck: Normal range of motion. Neck supple. No JVD present. No thyromegaly present.  Cardiovascular: Normal rate, regular rhythm and intact distal pulses. Exam reveals no gallop and no friction rub.  No murmur heard. Respiratory: Effort normal and breath sounds normal. No respiratory distress. He has no wheezes. He has no rales.  GI: Soft. Bowel sounds are normal. He exhibits no distension. There is tenderness.  Musculoskeletal: Normal range of motion. He exhibits no edema.  No arthritis, no gout  Lymphadenopathy:    He has no cervical adenopathy.  Neurological: He is alert. No cranial nerve deficit.  Patient  is oriented to person.  He has significant residual effects from prior stroke including some dysarthria, disconjugate gaze.  Skin: Skin is warm and dry. No rash noted. No erythema.  Psychiatric:  Unable to fully assess due to patient condition    LABORATORY PANEL:   CBC Recent Labs  Lab 12/23/17 1923  WBC 31.5*  HGB 12.8*  HCT 39.3*  PLT 353   ------------------------------------------------------------------------------------------------------------------  Chemistries  Recent Labs  Lab 12/23/17 1923  NA 142  K 4.0  CL 106  CO2 25  GLUCOSE 211*  BUN 30*  CREATININE 1.57*  CALCIUM 9.1  AST  27  ALT 13  ALKPHOS 67  BILITOT 1.0   ------------------------------------------------------------------------------------------------------------------  Cardiac Enzymes No results for input(s): TROPONINI in the last 168 hours. ------------------------------------------------------------------------------------------------------------------  RADIOLOGY:  Ct Abdomen Pelvis W Contrast  Result Date: 12/23/2017 CLINICAL DATA:  Generalized abdominal pain since last night. EXAM: CT ABDOMEN AND PELVIS WITH CONTRAST TECHNIQUE: Multidetector CT imaging of the abdomen and pelvis was performed using the standard protocol following bolus administration of intravenous contrast. CONTRAST:  75mL OMNIPAQUE IOHEXOL 300 MG/ML  SOLN COMPARISON:  CT abdomen dated 06/29/2017. CT abdomen dated 01/29/2013. FINDINGS: Lower chest: Mild bibasilar atelectasis. Diffuse coronary artery calcifications, incompletely imaged. Hepatobiliary: Gallbladder walls are markedly thickened, with pericholecystic edema/fluid stranding. Multiple layering stones within the gallbladder. No significant bile duct dilatation. No acute or significant findings within the liver. Pancreas: Unremarkable. No pancreatic ductal dilatation or surrounding inflammatory changes. Spleen: Normal in size without focal abnormality. Adrenals/Urinary  Tract: Adrenal glands are unremarkable. RIGHT renal cysts. No renal stone or hydronephrosis. No ureteral or bladder calculi. Stomach/Bowel: Moderate amount of stool throughout the nondistended colon and within the moderately distended rectal vault. Stomach is moderately distended with fluid in air, peg tube appearing appropriately positioned. No small bowel dilatation. No convincing evidence of bowel wall thickening or bowel wall inflammation. Vascular/Lymphatic: Aortic atherosclerosis. No enlarged abdominal or pelvic lymph nodes. Reproductive: Prostate is unremarkable. Other: No abscess collection seen.  No free intraperitoneal air. Musculoskeletal: No acute or suspicious osseous finding. Degenerative changes throughout the thoracolumbar spine, at least moderate in degree. Stable chronic compression deformity of the L1 vertebral body. IMPRESSION: 1. Findings are consistent with acute severe cholecystitis, with marked gallbladder wall thickening and pericholecystic edema/fluid stranding. No CBD stone or significant bile duct dilatation. 2. Aortic atherosclerosis. 3. Peg tube appears appropriately positioned. Stomach is moderately distended with fluid and air, perhaps ileus related to the adjacent cholecystitis. No evidence of a mechanical bowel obstruction. 4. Moderate amount of stool throughout the colon and rectal vault (constipation?). 5. Additional chronic/incidental findings detailed above. Electronically Signed   By: Bary Richard M.D.   On: 12/23/2017 20:34    EKG:   Orders placed or performed during the hospital encounter of 12/23/17  . EKG 12-Lead  . EKG 12-Lead    IMPRESSION AND PLAN:  Principal Problem:   Severe sepsis (HCC) -mildly elevated lactic acid, IV fluids started and will continue until lactic acid within normal limits.  Patient is hemodynamically stable, sepsis is due to cholecystitis and UTI.  IV antibiotics in place, culture sent Active Problems:   AKI (acute kidney injury) (HCC)  -IV fluids as above, avoid nephrotoxins and monitor   UTI (urinary tract infection) -IV antibiotics started, culture sent   Acute cholecystitis -IV antibiotics, general surgery consult.  If he is not a surgical candidate, could consider getting IR to place cholecystostomy drain   Essential hypertension -continue home meds   Diabetes mellitus type 2 in obese (HCC) -sliding scale insulin with corresponding glucose checks   Hyperlipidemia -Home dose antilipid   Mixed Alzheimer's and vascular dementia -continue home meds  Chart review performed and case discussed with ED provider. Labs, imaging and/or ECG reviewed by provider and discussed with patient/family. Management plans discussed with the patient and/or family.  DVT PROPHYLAXIS: SubQ lovenox  GI PROPHYLAXIS: None  ADMISSION STATUS: Inpatient  CODE STATUS: Full Code Status History    Date Active Date Inactive Code Status Order ID Comments User Context   08/13/2017 1711 08/25/2017 1443 Full Code [id number]  Gilmer Mor, DO Inpatient  07/28/2017 1832 08/13/2017 1711 Full Code [id number]  Jerene Pitch Inpatient   07/28/2017 1832 07/28/2017 1832 Full Code [id number]  Jerene Pitch Inpatient   07/23/2017 1623 07/28/2017 1829 Full Code [id number]  Arlyce Harman, DO ED      TOTAL TIME TAKING CARE OF THIS PATIENT: 45 minutes.   Brentney Goldbach FIELDING 12/23/2017, 9:37 PM  [facility]  Office  [phone]  CC: Primary care physician; Garnet Koyanagi, FNP  Note:  This document was prepared using Dragon voice recognition software and may include unintentional dictation errors.

## 2017-12-23 NOTE — Progress Notes (Signed)
CODE SEPSIS - PHARMACY COMMUNICATION  **Broad Spectrum Antibiotics should be administered within 1 hour of Sepsis diagnosis**  Time Code Sepsis Called/Page Received: 20:12  Antibiotics Ordered: Ceftriaxone  Time of 1st antibiotic administration: 21:00  Additional action taken by pharmacy: contacted RN at 20:58  If necessary, Name of Provider/Nurse Contacted: n/a    Foye DeerLisa G Zaylah Blecha ,PharmD Clinical Pharmacist  12/23/2017  9:03 PM

## 2017-12-24 ENCOUNTER — Other Ambulatory Visit: Payer: Self-pay

## 2017-12-24 LAB — CBC
HEMATOCRIT: 38.6 % — AB (ref 40.0–52.0)
HEMOGLOBIN: 12.7 g/dL — AB (ref 13.0–18.0)
MCH: 26.9 pg (ref 26.0–34.0)
MCHC: 33 g/dL (ref 32.0–36.0)
MCV: 81.5 fL (ref 80.0–100.0)
Platelets: 320 10*3/uL (ref 150–440)
RBC: 4.74 MIL/uL (ref 4.40–5.90)
RDW: 16.5 % — ABNORMAL HIGH (ref 11.5–14.5)
WBC: 28.2 10*3/uL — ABNORMAL HIGH (ref 3.8–10.6)

## 2017-12-24 LAB — GLUCOSE, CAPILLARY
GLUCOSE-CAPILLARY: 132 mg/dL — AB (ref 70–99)
GLUCOSE-CAPILLARY: 116 mg/dL — AB (ref 70–99)
Glucose-Capillary: 137 mg/dL — ABNORMAL HIGH (ref 70–99)
Glucose-Capillary: 106 mg/dL — ABNORMAL HIGH (ref 70–99)

## 2017-12-24 LAB — BASIC METABOLIC PANEL
ANION GAP: 11 (ref 5–15)
BUN: 35 mg/dL — ABNORMAL HIGH (ref 8–23)
CO2: 26 mmol/L (ref 22–32)
Calcium: 9 mg/dL (ref 8.9–10.3)
Chloride: 107 mmol/L (ref 98–111)
Creatinine, Ser: 1.52 mg/dL — ABNORMAL HIGH (ref 0.61–1.24)
GFR calc Af Amer: 49 mL/min — ABNORMAL LOW (ref >60)
GFR, EST NON AFRICAN AMERICAN: 42 mL/min — AB (ref >60)
Glucose, Bld: 132 mg/dL — ABNORMAL HIGH (ref 70–99)
POTASSIUM: 4.1 mmol/L (ref 3.5–5.1)
Sodium: 144 mmol/L (ref 135–145)

## 2017-12-24 LAB — LACTIC ACID, PLASMA
Lactic Acid, Venous: 2 mmol/L (ref 0.5–1.9)
Lactic Acid, Venous: 2.4 mmol/L (ref 0.5–1.9)

## 2017-12-24 MED ORDER — SODIUM CHLORIDE 0.9 % IV SOLN
INTRAVENOUS | Status: AC
  Administered 2017-12-24: 11:00:00 via INTRAVENOUS

## 2017-12-24 NOTE — Progress Notes (Signed)
Called Dr. Anne HahnWillis regarding lactic acid level of 2.4.  Doctor will put in order to check lactic acid levels with morning labs.  Will continue to monitor patient.  Lennon AlstromClay, Violia Knopf N  12/24/2017   1:22 AM

## 2017-12-24 NOTE — Progress Notes (Signed)
   12/24/17 0945  Clinical Encounter Type  Visited With Patient  Visit Type Initial;Spiritual support  Referral From Nurse  Consult/Referral To Chaplain  Spiritual Encounters  Spiritual Needs Brochure;Prayer   CH received an OR to educate the patient on the AD process. Before responding to the OR, I discovered that the patient had a diagnosis of dementia and Alzheimers. This would prevent the patient from completing the AD. Once entering the patient's room it was obvious that he wold not be able to complete the AD today. The OR will be closed.

## 2017-12-24 NOTE — Progress Notes (Signed)
Patient ID: Noah RumpfJames Yager Jr., male   DOB: September 18, 1940, 77 y.o.   MRN: [id number]     SURGICAL PROGRESS NOTE   Hospital Day(s): 1.   Post op day(s):  Marland Kitchen.   Interval History: Patient seen and examined, no acute events or new complaints overnight. Patient sleeping without distress.   Vital signs in last 24 hours: [min-max] current  Temp:  [97.4 F (36.3 C)-98.1 F (36.7 C)] 97.6 F (36.4 C) (07/25 1219) Pulse Rate:  [74-116] 105 (07/25 1219) Resp:  [15-21] 15 (07/25 1219) BP: (105-139)/(65-96) 116/74 (07/25 1219) SpO2:  [92 %-100 %] 100 % (07/25 1219) Weight:  [113.4 kg (250 lb)] 113.4 kg (250 lb) (07/24 1918)     Height: 5\' 11"  (180.3 cm) Weight: 113.4 kg (250 lb) BMI (Calculated): 34.88   Physical Exam:   Gastrointestinal: soft, tender on right upper quadrant, and non-distended  Labs:  CBC Latest Ref Rng & Units 12/24/2017 12/23/2017 08/24/2017  WBC 3.8 - 10.6 K/uL 28.2(H) 31.5(H) 12.1(H)  Hemoglobin 13.0 - 18.0 g/dL 12.7(L) 12.8(L) 12.5(L)  Hematocrit 40.0 - 52.0 % 38.6(L) 39.3(L) 37.8(L)  Platelets 150 - 440 K/uL 320 353 278   CMP Latest Ref Rng & Units 12/24/2017 12/23/2017 08/24/2017  Glucose 70 - 99 mg/dL 865(H132(H) 846(N211(H) 629(B164(H)  BUN 8 - 23 mg/dL 28(U35(H) 13(K30(H) 44(W33(H)  Creatinine 0.61 - 1.24 mg/dL 1.02(V1.52(H) 2.53(G1.57(H) 6.441.16  Sodium 135 - 145 mmol/L 144 142 135  Potassium 3.5 - 5.1 mmol/L 4.1 4.0 4.6  Chloride 98 - 111 mmol/L 107 106 101  CO2 22 - 32 mmol/L 26 25 23   Calcium 8.9 - 10.3 mg/dL 9.0 9.1 9.5  Total Protein 6.5 - 8.1 g/dL - 7.4 -  Total Bilirubin 0.3 - 1.2 mg/dL - 1.0 -  Alkaline Phos 38 - 126 U/L - 67 -  AST 15 - 41 U/L - 27 -  ALT 0 - 44 U/L - 13 -   Imaging studies: No new pertinent imaging studies   Assessment/Plan:  77 y.o. male with acute cholecystitis, complicated by pertinent comorbidities including severe dementia, alzheimer, stroke 4 months ago on plavix and aspirin, acute over chronic kidney injury. There is some improvement of the WBC count. There is no fever.  Continue with abdominal pain. Patient looks more comfortable today compared to yesterday at the ED. Continue IV abx therapy if, deteriorated, percutaneous cholecystostomy will be recommended.   Gae GallopEdgardo Cintrn-Daz, MD

## 2017-12-24 NOTE — Progress Notes (Signed)
[facility] - [place] at [facility]   PATIENT NAME: Noah Bush    MR#:  [id number]  DATE OF BIRTH:  [birth year]  SUBJECTIVE:  CHIEF COMPLAINT:   Chief Complaint  Patient presents with  . Emesis  Patient is confused and disoriented per baseline, appears comfortable  REVIEW OF SYSTEMS:  CONSTITUTIONAL: No fever, fatigue or weakness.  EYES: No blurred or double vision.  EARS, NOSE, AND THROAT: No tinnitus or ear pain.  RESPIRATORY: No cough, shortness of breath, wheezing or hemoptysis.  CARDIOVASCULAR: No chest pain, orthopnea, edema.  GASTROINTESTINAL: No nausea, vomiting, diarrhea or abdominal pain.  GENITOURINARY: No dysuria, hematuria.  ENDOCRINE: No polyuria, nocturia,  HEMATOLOGY: No anemia, easy bruising or bleeding SKIN: No rash or lesion. MUSCULOSKELETAL: No joint pain or arthritis.   NEUROLOGIC: No tingling, numbness, weakness.  PSYCHIATRY: No anxiety or depression.   ROS  DRUG ALLERGIES:  No Known Allergies  VITALS:  Blood pressure 116/74, pulse (!) 105, temperature 97.6 F (36.4 C), temperature source Oral, resp. rate 15, height 5\' 11"  (1.803 m), weight 113.4 kg (250 lb), SpO2 100 %.  PHYSICAL EXAMINATION:  GENERAL:  77 y.o.-year-old patient lying in the bed with no acute distress.  EYES: Pupils equal, round, reactive to light and accommodation. No scleral icterus. Extraocular muscles intact.  HEENT: Head atraumatic, normocephalic. Oropharynx and nasopharynx clear.  NECK:  Supple, no jugular venous distention. No thyroid enlargement, no tenderness.  LUNGS: Normal breath sounds bilaterally, no wheezing, rales,rhonchi or crepitation. No use of accessory muscles of respiration.  CARDIOVASCULAR: S1, S2 normal. No murmurs, rubs, or gallops.  ABDOMEN: Soft, nontender, nondistended. Bowel sounds present. No organomegaly or mass.  EXTREMITIES: No pedal edema, cyanosis, or clubbing.  NEUROLOGIC: Cranial nerves II through XII are intact. Muscle strength  5/5 in all extremities. Sensation intact. Gait not checked.  PSYCHIATRIC: The patient is alert and oriented x 3.  SKIN: No obvious rash, lesion, or ulcer.   Physical Exam LABORATORY PANEL:   CBC Recent Labs  Lab 12/24/17 0413  WBC 28.2*  HGB 12.7*  HCT 38.6*  PLT 320   ------------------------------------------------------------------------------------------------------------------  Chemistries  Recent Labs  Lab 12/23/17 1923 12/24/17 0413  NA 142 144  K 4.0 4.1  CL 106 107  CO2 25 26  GLUCOSE 211* 132*  BUN 30* 35*  CREATININE 1.57* 1.52*  CALCIUM 9.1 9.0  AST 27  --   ALT 13  --   ALKPHOS 67  --   BILITOT 1.0  --    ------------------------------------------------------------------------------------------------------------------  Cardiac Enzymes No results for input(s): TROPONINI in the last 168 hours. ------------------------------------------------------------------------------------------------------------------  RADIOLOGY:  Ct Abdomen Pelvis W Contrast  Result Date: 12/23/2017 CLINICAL DATA:  Generalized abdominal pain since last night. EXAM: CT ABDOMEN AND PELVIS WITH CONTRAST TECHNIQUE: Multidetector CT imaging of the abdomen and pelvis was performed using the standard protocol following bolus administration of intravenous contrast. CONTRAST:  75mL OMNIPAQUE IOHEXOL 300 MG/ML  SOLN COMPARISON:  CT abdomen dated 06/29/2017. CT abdomen dated 01/29/2013. FINDINGS: Lower chest: Mild bibasilar atelectasis. Diffuse coronary artery calcifications, incompletely imaged. Hepatobiliary: Gallbladder walls are markedly thickened, with pericholecystic edema/fluid stranding. Multiple layering stones within the gallbladder. No significant bile duct dilatation. No acute or significant findings within the liver. Pancreas: Unremarkable. No pancreatic ductal dilatation or surrounding inflammatory changes. Spleen: Normal in size without focal abnormality. Adrenals/Urinary Tract:  Adrenal glands are unremarkable. RIGHT renal cysts. No renal stone or hydronephrosis. No ureteral or bladder calculi. Stomach/Bowel: Moderate amount of stool throughout the  nondistended colon and within the moderately distended rectal vault. Stomach is moderately distended with fluid in air, peg tube appearing appropriately positioned. No small bowel dilatation. No convincing evidence of bowel wall thickening or bowel wall inflammation. Vascular/Lymphatic: Aortic atherosclerosis. No enlarged abdominal or pelvic lymph nodes. Reproductive: Prostate is unremarkable. Other: No abscess collection seen.  No free intraperitoneal air. Musculoskeletal: No acute or suspicious osseous finding. Degenerative changes throughout the thoracolumbar spine, at least moderate in degree. Stable chronic compression deformity of the L1 vertebral body. IMPRESSION: 1. Findings are consistent with acute severe cholecystitis, with marked gallbladder wall thickening and pericholecystic edema/fluid stranding. No CBD stone or significant bile duct dilatation. 2. Aortic atherosclerosis. 3. Peg tube appears appropriately positioned. Stomach is moderately distended with fluid and air, perhaps ileus related to the adjacent cholecystitis. No evidence of a mechanical bowel obstruction. 4. Moderate amount of stool throughout the colon and rectal vault (constipation?). 5. Additional chronic/incidental findings detailed above. Electronically Signed   By: Bary RichardStan  Maynard M.D.   On: 12/23/2017 20:34    ASSESSMENT AND PLAN:  *Acute severe sepsis Secondary to acute cholecystitis Continue sepsis protocol, empiric meropenem/vancomycin for now, follow-up on cultures  *Acute cholecystitis General surgery input appreciated-recommended continued empiric antibiotics given poor surgical candidate/high risk for surgery, if deteriorates patient may require percutaneous cholecystostomy tube placement, IV fluids for rehydration, continue close medical  monitoring  *Acute kidney injury Continue to avoid nephrotoxic agents, strict I&O monitoring, BMP in the morning  *Acute UTI Stable  Antibiotics per above and follow-up on cultures  *Chronic dementia, severe Continue increased nursing care PRN, aspiration/fall/skin care precautions while in house  *Essential hypertension Stable continue home meds  *Chronic diabetes mellitus type 2  Stable Continue sliding scale insulin with Accu-Cheks per routine  *Acute hyperlipidemia Stable Continue statin therapy  All the records are reviewed and case discussed with Care Management/Social Workerr. Management plans discussed with the patient, family and they are in agreement.  CODE STATUS: full  TOTAL TIME TAKING CARE OF THIS PATIENT: 35 minutes.     POSSIBLE D/C IN 3-5 DAYS, DEPENDING ON CLINICAL CONDITION.   Evelena AsaMontell D Dorothy Landgrebe M.D on 12/24/2017   Between 7am to 6pm - Pager - [phone]  After 6pm go to [email or web address] - password [email or web address]  [facility]  Office  [phone]  CC: Primary care physician; Garnet KoyanagiJohnson, Julie R, FNP  Note: This dictation was prepared with Dragon dictation along with smaller phrase technology. Any transcriptional errors that result from this process are unintentional.

## 2017-12-24 NOTE — Consult Note (Signed)
SURGICAL CONSULTATION NOTE   HISTORY OF PRESENT ILLNESS (HPI):  77 y.o. male presented to [facility] ED for evaluation of abdominal pain. Unable to take full history by patient. No family present on room. History taken from ED physician. Patient do refers that he came to the hospital due to vomiting. ED physician refers patient has abdominal pain nausea and vomiting that supposedly started last night. Unable to know if pain radiates to other part of the body. Unable to assess aggravator or alleviator factors.   Patient has complex history of vascular dementia and Alzheimer disease. Patient with dysphagia with gasterostome in place.   Surgery is consulted by Dr. Lamont Snowball in this context for evaluation and management of acute cholecystitis.  PAST MEDICAL HISTORY (PMH):  Past Medical History:  Diagnosis Date  . Alzheimer's dementia    [name] 07/23/2017  . Cerebral infarction (HCC) 07/27/2017  . Cerebrovascular accident (CVA) (HCC) 07/23/2017  . CKD (chronic kidney disease), stage III (HCC)    [name] 07/23/2017  . Gout   . Hypertension      PAST SURGICAL HISTORY (PSH):  Past Surgical History:  Procedure Laterality Date  . COLONOSCOPY WITH PROPOFOL N/A 05/09/2015   Procedure: COLONOSCOPY WITH PROPOFOL;  Surgeon: [name], MD;  Location: [facility] ENDOSCOPY;  Service: Endoscopy;  Laterality: N/A;  . IR GASTROSTOMY TUBE MOD SED  08/13/2017  . TIBIA FRACTURE SURGERY Left      MEDICATIONS:  Prior to Admission medications   Medication Sig Start Date End Date Taking? Authorizing Provider  acetaminophen (TYLENOL) 325 MG tablet Take 1-2 tablets (325-650 mg total) by mouth every 4 (four) hours as needed for mild pain. 08/24/17  Yes Love, Evlyn Kanner, PA-C  albuterol (PROVENTIL) (2.5 MG/3ML) 0.083% nebulizer solution Take 3 mLs (2.5 mg total) by nebulization every 6 (six) hours as needed for wheezing or shortness of breath. 08/24/17  Yes Love, Evlyn Kanner, PA-C  aspirin EC 81 MG tablet Take 81 mg by mouth  daily.   Yes [provider]  atorvastatin (LIPITOR) 80 MG tablet Take 1 tablet (80 mg total) by mouth daily at 6 PM. Patient taking differently: Take 40 mg by mouth daily at 6 PM.  07/28/17  Yes Garnette Gunner, MD  clopidogrel (PLAVIX) 75 MG tablet Take 1 tablet (75 mg total) by mouth daily. 08/25/17  Yes Love, Evlyn Kanner, PA-C  latanoprost (XALATAN) 0.005 % ophthalmic solution Place 1 drop into the left eye at bedtime. 08/24/17  Yes Love, Evlyn Kanner, PA-C  memantine (NAMENDA) 5 MG tablet Take 5 mg by mouth daily.   Yes [provider]  Menthol-Methyl Salicylate (MUSCLE RUB) 10-15 % CREA Apply 1 application topically 4 (four) times daily. To bilateral hands and bilateral knees 08/24/17  Yes Love, Evlyn Kanner, PA-C  methylphenidate (RITALIN) 5 MG tablet Take 1 tablet (5 mg total) by mouth 2 (two) times daily with breakfast and lunch. 08/24/17  Yes Love, Evlyn Kanner, PA-C  metoprolol succinate (TOPROL-XL) 25 MG 24 hr tablet Take 0.5 tablets (12.5 mg total) by mouth daily. 07/29/17  Yes Garnette Gunner, MD  multivitamin (RENA-VIT) TABS tablet Take 1 tablet by mouth at bedtime. 08/24/17  Yes Love, Evlyn Kanner, PA-C  Amino Acids-Protein Hydrolys (FEEDING SUPPLEMENT, PRO-STAT SUGAR FREE 64,) LIQD Place 30 mLs into feeding tube 3 (three) times daily between meals. 08/24/17   Love, Evlyn Kanner, PA-C  Nutritional Supplements (FEEDING SUPPLEMENT, OSMOLITE 1.5 CAL,) LIQD Place 280 mLs into feeding tube 4 (four) times daily -  after meals and at bedtime. 08/24/17   Love, Evlyn Kanner, PA-C  polyethylene glycol (MIRALAX / GLYCOLAX) packet Take 17 g by mouth 2 (two) times daily. Patient not taking: Reported on 12/23/2017 08/24/17   Love, Evlyn Kanner, PA-C  traZODone (DESYREL) 50 MG tablet Take 0.5-1 tablets (25-50 mg total) by mouth at bedtime as needed for sleep. Patient not taking: Reported on 12/23/2017 08/24/17   Love, Evlyn Kanner, PA-C  Water For Irrigation, Sterile (FREE WATER) SOLN Place 200 mLs into feeding tube 4 (four)  times daily. 08/24/17   Love, Evlyn Kanner, PA-C  Water For Irrigation, Sterile (FREE WATER) SOLN Place 250 mLs into feeding tube 4 (four) times daily. 08/25/17   Love, Evlyn Kanner, PA-C     ALLERGIES:  No Known Allergies   SOCIAL HISTORY:  Social History   Socioeconomic History  . Marital status: Married    Spouse name: Not on file  . Number of children: Not on file  . Years of education: Not on file  . Highest education level: Not on file  Occupational History  . Not on file  Social Needs  . Financial resource strain: Not on file  . Food insecurity:    Worry: Not on file    Inability: Not on file  . Transportation needs:    Medical: Not on file    Non-medical: Not on file  Tobacco Use  . Smoking status: Never Smoker  . Smokeless tobacco: Never Used  Substance and Sexual Activity  . Alcohol use: Yes    Alcohol/week: 1.2 oz    Types: 2 Cans of beer per week  . Drug use: No  . Sexual activity: Not on file  Lifestyle  . Physical activity:    Days per week: Not on file    Minutes per session: Not on file  . Stress: Not on file  Relationships  . Social connections:    Talks on phone: Not on file    Gets together: Not on file    Attends religious service: Not on file    Active member of club or organization: Not on file    Attends meetings of clubs or organizations: Not on file    Relationship status: Not on file  . Intimate partner violence:    Fear of current or ex partner: Not on file    Emotionally abused: Not on file    Physically abused: Not on file    Forced sexual activity: Not on file  Other Topics Concern  . Not on file  Social History Narrative  . Not on file    The patient normally is (ambulatory / bedbound): bedbound   FAMILY HISTORY:  Family History  Problem Relation Age of Onset  . Diabetes Paternal Grandfather      REVIEW OF SYSTEMS:  Unable to complete proper review of system due to patient mental status  VITAL SIGNS:  Temp:  [98 F (36.7  C)-98.1 F (36.7 C)] 98 F (36.7 C) (07/24 2318) Pulse Rate:  [74-116] 116 (07/24 2318) Resp:  [18-21] 20 (07/24 2318) BP: (105-139)/(65-96) 116/90 (07/24 2318) SpO2:  [92 %-100 %] 96 % (07/24 2318) Weight:  [113.4 kg (250 lb)] 113.4 kg (250 lb) (07/24 1918)     Height: 5\' 11"  (180.3 cm) Weight: 113.4 kg (250 lb) BMI (Calculated): 34.88   INTAKE/OUTPUT:  This shift: No intake/output data recorded.  Last 2 shifts: @IOLAST2SHIFTS @   PHYSICAL EXAM:  Constitutional:  -- Patient was awake, laying in bed  Eyes:  -- Pupils equally round and reactive to light  -- No scleral icterus  Ear, nose, and throat:  -- No jugular venous distension  Pulmonary:  -- No crackles  -- Equal breath sounds bilaterally -- Breathing non-labored at rest Cardiovascular:  -- S1, S2 present  -- No pericardial rubs Gastrointestinal:  -- Abdomen soft, tender to palpation in right upper quadrant, non-distended, no guarding or rebound tenderness, gastrotome in place -- No abdominal masses appreciated, pulsatile or otherwise  Musculoskeletal and Integumentary:  -- Wounds or skin discoloration: None appreciated -- Extremities: B/L UE and LE FROM, hands and feet warm, no edema  Neurologic:  -- moving extremities, strength 3/5   Labs:  CBC Latest Ref Rng & Units 12/23/2017 08/24/2017 08/18/2017  WBC 3.8 - 10.6 K/uL 31.5(H) 12.1(H) 9.9  Hemoglobin 13.0 - 18.0 g/dL 12.8(L) 12.5(L) 11.4(L)  Hematocrit 40.0 - 52.0 % 39.3(L) 37.8(L) 34.8(L)  Platelets 150 - 440 K/uL 353 278 337   CMP Latest Ref Rng & Units 12/23/2017 08/24/2017 08/20/2017  Glucose 70 - 99 mg/dL 409(W) 119(J) 478(G)  BUN 8 - 23 mg/dL 95(A) 21(H) 08(M)  Creatinine 0.61 - 1.24 mg/dL 5.78(I) 6.96 2.95  Sodium 135 - 145 mmol/L 142 135 136  Potassium 3.5 - 5.1 mmol/L 4.0 4.6 4.8  Chloride 98 - 111 mmol/L 106 101 104  CO2 22 - 32 mmol/L 25 23 19(L)  Calcium 8.9 - 10.3 mg/dL 9.1 9.5 9.3  Total Protein 6.5 - 8.1 g/dL 7.4 - -  Total Bilirubin 0.3 - 1.2  mg/dL 1.0 - -  Alkaline Phos 38 - 126 U/L 67 - -  AST 15 - 41 U/L 27 - -  ALT 0 - 44 U/L 13 - -    Imaging studies:  EXAM: CT ABDOMEN AND PELVIS WITH CONTRAST  TECHNIQUE: Multidetector CT imaging of the abdomen and pelvis was performed using the standard protocol following bolus administration of intravenous contrast.  CONTRAST:  75mL OMNIPAQUE IOHEXOL 300 MG/ML  SOLN  COMPARISON:  CT abdomen dated 06/29/2017. CT abdomen dated 01/29/2013.  FINDINGS: Lower chest: Mild bibasilar atelectasis. Diffuse coronary artery calcifications, incompletely imaged.  Hepatobiliary: Gallbladder walls are markedly thickened, with pericholecystic edema/fluid stranding. Multiple layering stones within the gallbladder. No significant bile duct dilatation. No acute or significant findings within the liver.  Pancreas: Unremarkable. No pancreatic ductal dilatation or surrounding inflammatory changes.  Spleen: Normal in size without focal abnormality.  Adrenals/Urinary Tract: Adrenal glands are unremarkable. RIGHT renal cysts. No renal stone or hydronephrosis. No ureteral or bladder calculi.  Stomach/Bowel: Moderate amount of stool throughout the nondistended colon and within the moderately distended rectal vault. Stomach is moderately distended with fluid in air, peg tube appearing appropriately positioned. No small bowel dilatation. No convincing evidence of bowel wall thickening or bowel wall inflammation.  Vascular/Lymphatic: Aortic atherosclerosis. No enlarged abdominal or pelvic lymph nodes.  Reproductive: Prostate is unremarkable.  Other: No abscess collection seen.  No free intraperitoneal air.  Musculoskeletal: No acute or suspicious osseous finding. Degenerative changes throughout the thoracolumbar spine, at least moderate in degree. Stable chronic compression deformity of the L1 vertebral body.  IMPRESSION: 1. Findings are consistent with acute severe  cholecystitis, with marked gallbladder wall thickening and pericholecystic edema/fluid stranding. No CBD stone or significant bile duct dilatation. 2. Aortic atherosclerosis. 3. Peg tube appears appropriately positioned. Stomach is moderately distended with fluid and air, perhaps ileus related to the adjacent cholecystitis. No evidence of a mechanical bowel obstruction. 4. Moderate amount of stool throughout  the colon and rectal vault (constipation?). 5. Additional chronic/incidental findings detailed above.  Electronically Signed   By: Bary RichardStan  Maynard M.D.   On: 12/23/2017 20:34  Assessment/Plan:  77 y.o. male with acute cholecystitis, complicated by pertinent comorbidities including severe dementia, alzheimer, stroke 4 months ago on plavix and aspirin, acute over chronic kidney injury.  Patient with multiple acute and chronic medical condition that makes the patient a poor candidate, very high risk for surgery. This is a complicated case due to combination of Plavix and aspirin due to a recent stroke. I consider that medical management with antibiotic should be initiated and if patient does not respond to medical therapy I will recommend percutaneous cholecystostomy. I will follow with you in case patient needs surgical management but will definitely be a high risk.   Gae GallopEdgardo Cintrn-Daz, MD

## 2017-12-25 LAB — COMPREHENSIVE METABOLIC PANEL
ALT: 17 U/L (ref 0–44)
ANION GAP: 8 (ref 5–15)
AST: 25 U/L (ref 15–41)
Albumin: 2.2 g/dL — ABNORMAL LOW (ref 3.5–5.0)
Alkaline Phosphatase: 58 U/L (ref 38–126)
BILIRUBIN TOTAL: 0.7 mg/dL (ref 0.3–1.2)
BUN: 47 mg/dL — ABNORMAL HIGH (ref 8–23)
CO2: 23 mmol/L (ref 22–32)
Calcium: 8.2 mg/dL — ABNORMAL LOW (ref 8.9–10.3)
Chloride: 111 mmol/L (ref 98–111)
Creatinine, Ser: 1.44 mg/dL — ABNORMAL HIGH (ref 0.61–1.24)
GFR calc Af Amer: 53 mL/min — ABNORMAL LOW (ref >60)
GFR calc non Af Amer: 45 mL/min — ABNORMAL LOW (ref >60)
GLUCOSE: 139 mg/dL — AB (ref 70–99)
POTASSIUM: 3.9 mmol/L (ref 3.5–5.1)
Sodium: 142 mmol/L (ref 135–145)
TOTAL PROTEIN: 5.7 g/dL — AB (ref 6.5–8.1)

## 2017-12-25 LAB — IRON AND TIBC
Iron: 33 ug/dL — ABNORMAL LOW (ref 45–182)
SATURATION RATIOS: 28 % (ref 17.9–39.5)
TIBC: 119 ug/dL — AB (ref 250–450)
UIBC: 86 ug/dL

## 2017-12-25 LAB — CBC
HEMATOCRIT: 26 % — AB (ref 40.0–52.0)
Hemoglobin: 8.6 g/dL — ABNORMAL LOW (ref 13.0–18.0)
MCH: 27.3 pg (ref 26.0–34.0)
MCHC: 33 g/dL (ref 32.0–36.0)
MCV: 82.7 fL (ref 80.0–100.0)
PLATELETS: 304 10*3/uL (ref 150–440)
RBC: 3.14 MIL/uL — ABNORMAL LOW (ref 4.40–5.90)
RDW: 16.6 % — AB (ref 11.5–14.5)
WBC: 28.1 10*3/uL — ABNORMAL HIGH (ref 3.8–10.6)

## 2017-12-25 LAB — OCCULT BLOOD X 1 CARD TO LAB, STOOL: Fecal Occult Bld: NEGATIVE

## 2017-12-25 LAB — GLUCOSE, CAPILLARY
GLUCOSE-CAPILLARY: 100 mg/dL — AB (ref 70–99)
GLUCOSE-CAPILLARY: 129 mg/dL — AB (ref 70–99)
Glucose-Capillary: 132 mg/dL — ABNORMAL HIGH (ref 70–99)
Glucose-Capillary: 126 mg/dL — ABNORMAL HIGH (ref 70–99)

## 2017-12-25 LAB — FERRITIN: Ferritin: 1592 ng/mL — ABNORMAL HIGH (ref 24–336)

## 2017-12-25 LAB — TSH: TSH: 1.289 u[IU]/mL (ref 0.350–4.500)

## 2017-12-25 LAB — FOLATE: Folate: 13.7 ng/mL (ref >5.9)

## 2017-12-25 LAB — VITAMIN B12: VITAMIN B 12: 905 pg/mL (ref 180–914)

## 2017-12-25 MED ORDER — LACTATED RINGERS IV SOLN
INTRAVENOUS | Status: DC
  Administered 2017-12-25 – 2017-12-28 (×3): via INTRAVENOUS

## 2017-12-25 MED ORDER — PIPERACILLIN-TAZOBACTAM 3.375 G IVPB
3.3750 g | Freq: Three times a day (TID) | INTRAVENOUS | Status: DC
  Administered 2017-12-25 – 2018-01-03 (×26): 3.375 g via INTRAVENOUS
  Filled 2017-12-25 (×26): qty 50

## 2017-12-25 MED ORDER — KCL IN DEXTROSE-NACL 20-5-0.45 MEQ/L-%-% IV SOLN
INTRAVENOUS | Status: DC
  Administered 2017-12-25: 14:00:00 via INTRAVENOUS
  Filled 2017-12-25 (×3): qty 1000

## 2017-12-25 MED ORDER — PIPERACILLIN-TAZOBACTAM 3.375 G IVPB 30 MIN: 3.3750 g | Freq: Four times a day (QID) | INTRAVENOUS | Status: DC

## 2017-12-25 MED ORDER — SODIUM CHLORIDE 0.9 % IV BOLUS
1000.0000 mL | Freq: Once | INTRAVENOUS | Status: AC
  Administered 2017-12-25: 1000 mL via INTRAVENOUS

## 2017-12-25 MED ORDER — LACTATED RINGERS IV BOLUS
1000.0000 mL | Freq: Once | INTRAVENOUS | Status: AC
  Administered 2017-12-25: 1000 mL via INTRAVENOUS

## 2017-12-25 NOTE — Consult Note (Signed)
Pharmacy Antibiotic Note  Noah RumpfJames Nield Jr. is a 77 y.o. male admitted on 12/23/2017 with sepsis secondary to acute cholesystitis. Pharmacy has been consulted for Zosyn dosing.   Plan: Zosyn EI IV q8h  Height: 5\' 11"  (180.3 cm) Weight: 250 lb (113.4 kg) IBW/kg (Calculated) : 75.3  Temp (24hrs), Avg:98.7 F (37.1 C), Min:98.4 F (36.9 C), Max:99 F (37.2 C)  Recent Labs  Lab 12/23/17 1923 12/23/17 1924 12/24/17 0014 12/24/17 0413 12/25/17 0927  WBC 31.5*  --   --  28.2* 28.1*  CREATININE 1.57*  --   --  1.52* 1.44*  LATICACIDVEN  --  2.2* 2.4* 2.0*  --     Estimated Creatinine Clearance: 55 mL/min (A) (by C-G formula based on SCr of 1.44 mg/dL (H)).    No Known Allergies  Antimicrobials this admission: 7/24 Ceftriaxone >> x 1 dose  7/24 vancomycin >> 7/26 7/24 Meropenem >> 7/26 7/26 Zosyn >>   Microbiology results: 7/24 BCx: pending  7/24 UCx: >100k E coli  Thank you for allowing pharmacy to be a part of this patient's care.  Lowella Bandyodney D Avielle Imbert, PharmD Clinical Pharmacist 12/25/2017 12:48 PM

## 2017-12-25 NOTE — Progress Notes (Signed)
[facility] [place] [facility]   PATIENT NAME: Noah RilingJames Steinruck    MR#:  [id number]  DATE OF BIRTH:  May 30, 1941  SUBJECTIVE:  CHIEF COMPLAINT:   Chief Complaint  Patient presents with  . Emesis   Patient continues to be confused and disoriented per baseline  REVIEW OF SYSTEMS:  CONSTITUTIONAL: No fever, fatigue or weakness.  EYES: No blurred or double vision.  EARS, NOSE, AND THROAT: No tinnitus or ear pain.  RESPIRATORY: No cough, shortness of breath, wheezing or hemoptysis.  CARDIOVASCULAR: No chest pain, orthopnea, edema.  GASTROINTESTINAL: No nausea, vomiting, diarrhea or abdominal pain.  GENITOURINARY: No dysuria, hematuria.  ENDOCRINE: No polyuria, nocturia,  HEMATOLOGY: No anemia, easy bruising or bleeding SKIN: No rash or lesion. MUSCULOSKELETAL: No joint pain or arthritis.   NEUROLOGIC: No tingling, numbness, weakness.  PSYCHIATRY: No anxiety or depression.   ROS  DRUG ALLERGIES:  No Known Allergies  VITALS:  Blood pressure 102/80, pulse 96, temperature 99 F (37.2 C), temperature source Oral, resp. rate 16, height 5\' 11"  (1.803 m), weight 113.4 kg (250 lb), SpO2 100 %.  PHYSICAL EXAMINATION:  GENERAL:  77 y.o.-year-old patient lying in the bed with no acute distress.  EYES: Pupils equal, round, reactive to light and accommodation. No scleral icterus. Extraocular muscles intact.  HEENT: Head atraumatic, normocephalic. Oropharynx and nasopharynx clear.  NECK:  Supple, no jugular venous distention. No thyroid enlargement, no tenderness.  LUNGS: Normal breath sounds bilaterally, no wheezing, rales,rhonchi or crepitation. No use of accessory muscles of respiration.  CARDIOVASCULAR: S1, S2 normal. No murmurs, rubs, or gallops.  ABDOMEN: Soft, nontender, nondistended. Bowel sounds present. No organomegaly or mass.  EXTREMITIES: No pedal edema, cyanosis, or clubbing.  NEUROLOGIC: Cranial nerves II through XII are intact. Muscle strength 5/5 in all  extremities. Sensation intact. Gait not checked.  PSYCHIATRIC: The patient is alert and oriented x 3.  SKIN: No obvious rash, lesion, or ulcer.   Physical Exam LABORATORY PANEL:   CBC Recent Labs  Lab 12/25/17 0927  WBC 28.1*  HGB 8.6*  HCT 26.0*  PLT 304   ------------------------------------------------------------------------------------------------------------------  Chemistries  Recent Labs  Lab 12/25/17 0927  NA 142  K 3.9  CL 111  CO2 23  GLUCOSE 139*  BUN 47*  CREATININE 1.44*  CALCIUM 8.2*  AST 25  ALT 17  ALKPHOS 58  BILITOT 0.7   ------------------------------------------------------------------------------------------------------------------  Cardiac Enzymes No results for input(s): TROPONINI in the last 168 hours. ------------------------------------------------------------------------------------------------------------------  RADIOLOGY:  Ct Abdomen Pelvis W Contrast  Result Date: 12/23/2017 CLINICAL DATA:  Generalized abdominal pain since last night. EXAM: CT ABDOMEN AND PELVIS WITH CONTRAST TECHNIQUE: Multidetector CT imaging of the abdomen and pelvis was performed using the standard protocol following bolus administration of intravenous contrast. CONTRAST:  75mL OMNIPAQUE IOHEXOL 300 MG/ML  SOLN COMPARISON:  CT abdomen dated 06/29/2017. CT abdomen dated 01/29/2013. FINDINGS: Lower chest: Mild bibasilar atelectasis. Diffuse coronary artery calcifications, incompletely imaged. Hepatobiliary: Gallbladder walls are markedly thickened, with pericholecystic edema/fluid stranding. Multiple layering stones within the gallbladder. No significant bile duct dilatation. No acute or significant findings within the liver. Pancreas: Unremarkable. No pancreatic ductal dilatation or surrounding inflammatory changes. Spleen: Normal in size without focal abnormality. Adrenals/Urinary Tract: Adrenal glands are unremarkable. RIGHT renal cysts. No renal stone or hydronephrosis.  No ureteral or bladder calculi. Stomach/Bowel: Moderate amount of stool throughout the nondistended colon and within the moderately distended rectal vault. Stomach is moderately distended with fluid in air, peg tube appearing appropriately positioned.  No small bowel dilatation. No convincing evidence of bowel wall thickening or bowel wall inflammation. Vascular/Lymphatic: Aortic atherosclerosis. No enlarged abdominal or pelvic lymph nodes. Reproductive: Prostate is unremarkable. Other: No abscess collection seen.  No free intraperitoneal air. Musculoskeletal: No acute or suspicious osseous finding. Degenerative changes throughout the thoracolumbar spine, at least moderate in degree. Stable chronic compression deformity of the L1 vertebral body. IMPRESSION: 1. Findings are consistent with acute severe cholecystitis, with marked gallbladder wall thickening and pericholecystic edema/fluid stranding. No CBD stone or significant bile duct dilatation. 2. Aortic atherosclerosis. 3. Peg tube appears appropriately positioned. Stomach is moderately distended with fluid and air, perhaps ileus related to the adjacent cholecystitis. No evidence of a mechanical bowel obstruction. 4. Moderate amount of stool throughout the colon and rectal vault (constipation?). 5. Additional chronic/incidental findings detailed above. Electronically Signed   By: Bary Richard M.D.   On: 12/23/2017 20:34    ASSESSMENT AND PLAN:  *Acute severe sepsis Resolving Secondary to acute cholecystitis Continue sepsis protocol, IV fluids for rehydration, discontinue meropenem/vancomycin, start Zosyn, follow-up on cultures  *Acute cholecystitis General surgery following-recommended continued empiric antibiotics given poor surgical candidate/high risk for surgery, if deteriorates patient may require percutaneous cholecystostomy tube placement, IV fluids for rehydration with dextrose, consult dietary, may require TPN if unable to start diet in the  next 1 to 3 days  *Acute kidney injury Resolving slowly Continue to avoid nephrotoxic agents, strict I&O monitoring, BMP in the morning  *Acute UTI Secondary to gram-negative rods Continue to box per above and follow-up on culture results  *Chronic dementia, severe Stable Continue increased nursing care PRN, aspiration/fall/skin care precautions while in house  *Essential hypertension Currently hypotensive Avoid antihypertensives given relative hypotension, vitals per routine, make changes as per necessary  *Chronic diabetes mellitus type 2  Stable Continue SSI with Accu-Cheks per routine  *Acute hyperlipidemia Stable Continue statin therapy  *Acute worsening anemia Hemoglobin 8.6 down from 12 Discontinue heparin subcu, SCDs will be started, check anemia work-up/guaiac x1, H&H every 8 hours, CBC daily, and transfuse as needed  *Recent ischemic supravascular accident DAPT of Plavix/aspirin discontinued as acute GI bleeding cannot be excluded at this time, continue statin therapy, aspiration/fall/skin care precautions while in house  Condition stable Prognosis very poor Disposition pending clinical course Palliative care consulted  All the records are reviewed and case discussed with Care Management/Social Workerr. Management plans discussed with the patient, family and they are in agreement.  CODE STATUS: full  TOTAL TIME TAKING CARE OF THIS PATIENT: 35 minutes.     POSSIBLE D/C IN 5 DAYS, DEPENDING ON CLINICAL CONDITION.   Evelena Asa Tonnie Friedel M.D on 12/25/2017   Between 7am to 6pm - Pager - [phone]  After 6pm go to [email or web address] - password [email or web address]  [facility]  Office  [phone]  CC: Primary care physician; Garnet Koyanagi, FNP  Note: This dictation was prepared with Dragon dictation along with smaller phrase technology. Any transcriptional errors that result from this process are unintentional.

## 2017-12-25 NOTE — Care Management Important Message (Signed)
No family in room. Spoke with [name], son, and reviewed Medicare IM right with him.  He's in agreement with current plans and stated patient was ready to come home.  Initial IM needs to be signed.  Explained this to son.  He would like to sign it, however will not be able to come until after 5pm.  Informed I would leave IM in room on counter for him to sign when he arrived and instructed him to leave in room for someone to pick up tomorrow so that it gets scanned into chart.

## 2017-12-25 NOTE — Progress Notes (Signed)
   12/25/17 1830  Clinical Encounter Type  Visited With Patient;Health care provider  Visit Type Follow-up (advanced directive order)  Referral From Physician  Consult/Referral To Chaplain   Chaplain attempted follow up regarding advanced directive order.  Patient declined visit; chaplain spoke to patient nurse.  Will clear order.

## 2017-12-25 NOTE — Progress Notes (Signed)
Palliative Medicine Consult Order Noted. Due to high referral volume, there will be a delay seeing this patient. Palliative Medicine Provider will return to [facility] on 12/28/17, and patient will be evaluated then. If patient discharges before they are seen, an outpatient palliative referral may be appropriate.   Please call the Palliative Medicine Team office at [phone] if recommendations are needed in the interim.  Thank you for inviting us to see this patient.  No charge note.  Hayleen Clinkscales Lee Amali Uhls, DNP, AGNP-C Palliative Medicine Team Team Phone # [phone]  Pager # [phone]  

## 2017-12-25 NOTE — Progress Notes (Signed)
   12/25/17 1530  Clinical Encounter Type  Visited With Patient not available  Visit Type Follow-up (advanced directive order request)  Referral From Nurse  Consult/Referral To Chaplain   Attempted to follow up on order regarding advanced directive.  Patient sleeping; chaplain unable to assess patient ability to engage.  Will leave order open.

## 2017-12-25 NOTE — Progress Notes (Signed)
I alerted Dr. Katheren ShamsSalary that the pt's feet were cold to the touch so he stated that he will ask for a Vascular consult. His pulses were heard by doppler only because his feel has edema.

## 2017-12-26 LAB — CBC
HEMATOCRIT: 22.4 % — AB (ref 40.0–52.0)
Hemoglobin: 7.5 g/dL — ABNORMAL LOW (ref 13.0–18.0)
MCH: 27.4 pg (ref 26.0–34.0)
MCHC: 33.3 g/dL (ref 32.0–36.0)
MCV: 82.4 fL (ref 80.0–100.0)
Platelets: 285 10*3/uL (ref 150–440)
RBC: 2.72 MIL/uL — ABNORMAL LOW (ref 4.40–5.90)
RDW: 17 % — AB (ref 11.5–14.5)
WBC: 25.2 10*3/uL — ABNORMAL HIGH (ref 3.8–10.6)

## 2017-12-26 LAB — GLUCOSE, CAPILLARY
GLUCOSE-CAPILLARY: 96 mg/dL (ref 70–99)
Glucose-Capillary: 106 mg/dL — ABNORMAL HIGH (ref 70–99)
Glucose-Capillary: 107 mg/dL — ABNORMAL HIGH (ref 70–99)
Glucose-Capillary: 119 mg/dL — ABNORMAL HIGH (ref 70–99)
Glucose-Capillary: 95 mg/dL (ref 70–99)

## 2017-12-26 LAB — URINE CULTURE: Culture: >=100000 — AB

## 2017-12-26 LAB — BASIC METABOLIC PANEL
Anion gap: 6 (ref 5–15)
BUN: 45 mg/dL — AB (ref 8–23)
CALCIUM: 8.5 mg/dL — AB (ref 8.9–10.3)
CO2: 26 mmol/L (ref 22–32)
Chloride: 116 mmol/L — ABNORMAL HIGH (ref 98–111)
Creatinine, Ser: 1.41 mg/dL — ABNORMAL HIGH (ref 0.61–1.24)
GFR calc Af Amer: 54 mL/min — ABNORMAL LOW (ref >60)
GFR, EST NON AFRICAN AMERICAN: 47 mL/min — AB (ref >60)
GLUCOSE: 116 mg/dL — AB (ref 70–99)
Potassium: 4 mmol/L (ref 3.5–5.1)
Sodium: 148 mmol/L — ABNORMAL HIGH (ref 135–145)

## 2017-12-26 MED ORDER — MEMANTINE HCL 5 MG PO TABS
5.0000 mg | ORAL_TABLET | Freq: Every day | ORAL | Status: DC
  Administered 2017-12-26 – 2018-01-03 (×9): 5 mg via ORAL
  Filled 2017-12-26 (×9): qty 1

## 2017-12-26 NOTE — Progress Notes (Signed)
Patient ID: Noah RumpfJames Belue Jr., male   DOB: December 15, 1940, 77 y.o.   MRN: [id number]     SURGICAL PROGRESS NOTE   Hospital Day(s): 3.   Post op day(s):  Marland Kitchen.   Interval History: Patient seen and examined, no acute events or new complaints overnight. Patient reports still has pain.   Vital signs in last 24 hours: [min-max] current  Temp:  [98.3 F (36.8 C)] 98.3 F (36.8 C) (07/27 0620) Pulse Rate:  [56-122] 122 (07/27 0730) Resp:  [20-24] 24 (07/27 0620) BP: (81-127)/(66-80) 127/76 (07/27 0620) SpO2:  [89 %-100 %] 91 % (07/27 0730)     Height: 5\' 11"  (180.3 cm) Weight: 113.4 kg (250 lb) BMI (Calculated): 34.88   Intake/Output this shift:  Total I/O In: 248 [P.O.:60; I.V.:188] Out: -    Intake/Output last 2 shifts:  @IOLAST2SHIFTS @   Physical Exam:  Constitutional: alert, cooperative and no distress  Gastrointestinal: soft, moderate-tender on epigastric area, and non-distended  Labs:  CBC Latest Ref Rng & Units 12/26/2017 12/25/2017 12/24/2017  WBC 3.8 - 10.6 K/uL 25.2(H) 28.1(H) 28.2(H)  Hemoglobin 13.0 - 18.0 g/dL 7.5(L) 8.6(L) 12.7(L)  Hematocrit 40.0 - 52.0 % 22.4(L) 26.0(L) 38.6(L)  Platelets 150 - 440 K/uL 285 304 320   CMP Latest Ref Rng & Units 12/26/2017 12/25/2017 12/24/2017  Glucose 70 - 99 mg/dL 409(W116(H) 119(J139(H) 478(G132(H)  BUN 8 - 23 mg/dL 95(A45(H) 21(H47(H) 08(M35(H)  Creatinine 0.61 - 1.24 mg/dL 5.78(I1.41(H) 6.96(E1.44(H) 9.52(W1.52(H)  Sodium 135 - 145 mmol/L 148(H) 142 144  Potassium 3.5 - 5.1 mmol/L 4.0 3.9 4.1  Chloride 98 - 111 mmol/L 116(H) 111 107  CO2 22 - 32 mmol/L 26 23 26   Calcium 8.9 - 10.3 mg/dL 4.1(L8.5(L) 2.4(M8.2(L) 9.0  Total Protein 6.5 - 8.1 g/dL - 5.7(L) -  Total Bilirubin 0.3 - 1.2 mg/dL - 0.7 -  Alkaline Phos 38 - 126 U/L - 58 -  AST 15 - 41 U/L - 25 -  ALT 0 - 44 U/L - 17 -    Assessment/Plan:  77 y.o.malewith acute cholecystitis, complicated by pertinent comorbidities includingsevere dementia, alzheimer, stroke 4 months ago on plavix and aspirin, acute over chronic kidney  injury. There is minimal improvement of the WBC count. There is no fever. Continue with abdominal pain. Continue IV abx therapy if, deteriorated, percutaneous cholecystostomy will be recommended.     Gae GallopEdgardo Cintrn-Daz, MD

## 2017-12-26 NOTE — Evaluation (Signed)
Clinical/Bedside Swallow Evaluation Patient Details  Name: Noah RumpfJames Civil Jr. MRN: [id number] Date of Birth: 09-23-1940  Today's Date: 12/26/2017 Time: SLP Start Time (ACUTE ONLY): 0830 SLP Stop Time (ACUTE ONLY): 0910 SLP Time Calculation (min) (ACUTE ONLY): 40 min  Past Medical History:  Past Medical History:  Diagnosis Date  . Alzheimer's dementia    [name]/notes 07/23/2017  . Cerebral infarction (HCC) 07/27/2017  . Cerebrovascular accident (CVA) (HCC) 07/23/2017  . CKD (chronic kidney disease), stage III (HCC)    [name]/notes 07/23/2017  . Gout   . Hypertension    Past Surgical History:  Past Surgical History:  Procedure Laterality Date  . COLONOSCOPY WITH PROPOFOL N/A 05/09/2015   Procedure: COLONOSCOPY WITH PROPOFOL;  Surgeon: Earline MayotteJeffrey W Byrnett, MD;  [facility];  Service: Endoscopy;  Laterality: N/A;  . IR GASTROSTOMY TUBE MOD SED  08/13/2017  . TIBIA FRACTURE SURGERY Left    HPI:      Assessment / Plan / Recommendation Clinical Impression  pt presents with a mild oral pharyngeal dysphagia as characterized by increased a-p transit time. pt had noted dry mouth upon st arrival however pt was able to intake x 2 8 oz cups of water and noted dry oral cavity resolved. pt stated being very thristy. SLP educated pt to sit at 90 degrees and to take small sips of water via cup and to avoid straws. slp guarded on pt ability to recall education at this time and will follow up with nsg to ensure education. slp notified nsg of new diet order and to have NO STRAWS with thin liquids. pt should have medications crushed in puree.   pt had no ssx aspiration with solids however did attempt to talk during intake. slp educated pt to avoid communication when bolus is present.  SLP Visit Diagnosis: Dysphagia, oropharyngeal phase (R13.12)    Aspiration Risk  Mild aspiration risk    Diet Recommendation Dysphagia 2 (Fine chop);Thin liquid   Liquid Administration via: Cup;No straw Medication  Administration: Crushed with puree Supervision: Staff to assist with self feeding Compensations: Slow rate;Small sips/bites;Follow solids with liquid Postural Changes: Seated upright at 90 degrees    Other  Recommendations Oral Care Recommendations: Oral care BID Other Recommendations: Remove water pitcher   Follow up Recommendations        Frequency and Duration min 3x week  2 weeks       Prognosis Prognosis for Safe Diet Advancement: Fair Barriers to Reach Goals: Cognitive deficits Barriers/Prognosis Comment: General prognosis is poor.      Swallow Study   General Date of Onset: 12/25/17 Type of Study: Bedside Swallow Evaluation Diet Prior to this Study: Dysphagia 1 (puree);Nectar-thick liquids Temperature Spikes Noted: No Respiratory Status: Room air History of Recent Intubation: No Behavior/Cognition: Alert;Cooperative;Pleasant mood;Confused Oral Cavity Assessment: Dry Oral Care Completed by SLP: No Oral Cavity - Dentition: Poor condition;Missing dentition Vision: Impaired for self-feeding Self-Feeding Abilities: Needs assist Patient Positioning: Upright in bed Baseline Vocal Quality: Normal Volitional Cough: Strong Volitional Swallow: Able to elicit    Oral/Motor/Sensory Function Overall Oral Motor/Sensory Function: Within functional limits   Ice Chips Ice chips: Within functional limits Presentation: Spoon   Thin Liquid Thin Liquid: Within functional limits Presentation: Cup;Straw Other Comments: [facility] however did have wet vocal quality with straw, pt has order for NO STRAW on diet.     Nectar Thick Nectar Thick Liquid: Not tested   Honey Thick Honey Thick Liquid: Not tested   Puree Puree: Within functional  limits Presentation: Spoon   Solid     Solid: Within functional limits Presentation: Melodye Ped Sauber 12/26/2017,9:26 AM

## 2017-12-26 NOTE — Progress Notes (Signed)
[facility] [place] [facility]   PATIENT NAME: Noah Bush    MR#:  [id number]  DATE OF BIRTH:  Apr 14, 1941  SUBJECTIVE:   Patient here due to sepsis from acute cholecystitis.  Still having some abdominal pain but no nausea or vomiting.  Afebrile.  White cell count still remains elevated.  REVIEW OF SYSTEMS:    Review of Systems  Gastrointestinal: Positive for abdominal pain.    Nutrition: Dysphagia 1 Tolerating Diet: Yes but little. Tolerating PT: Bedbound  DRUG ALLERGIES:  No Known Allergies  VITALS:  Blood pressure 127/82, pulse (!) 102, temperature 98.3 F (36.8 C), temperature source Oral, resp. rate 16, height 5\' 11"  (1.803 m), weight 113.4 kg (250 lb), SpO2 100 %.  PHYSICAL EXAMINATION:   Physical Exam  GENERAL:  77 y.o.-year-old patient lying in bed in no acute distress.  EYES: Pupils equal, round, reactive to light and accommodation. No scleral icterus. Extraocular muscles intact.  HEENT: Head atraumatic, normocephalic. Oropharynx and nasopharynx clear.  NECK:  Supple, no jugular venous distention. No thyroid enlargement, no tenderness.  LUNGS: Normal breath sounds bilaterally, no wheezing, rales, rhonchi. No use of accessory muscles of respiration.  CARDIOVASCULAR: S1, S2 normal. No murmurs, rubs, or gallops.  ABDOMEN: Soft, Tender in RUQ/epigastric area, slightly distended. Bowel sounds present. No organomegaly or mass. + PEG tube in place.  EXTREMITIES: No cyanosis, clubbing or edema b/l.    NEUROLOGIC: Cranial nerves II through XII are intact. No focal Motor or sensory deficits b/l.  Globally weak.  PSYCHIATRIC: The patient is alert and oriented x 1.  SKIN: No obvious rash, lesion, or ulcer.    LABORATORY PANEL:   CBC Recent Labs  Lab 12/26/17 0627  WBC 25.2*  HGB 7.5*  HCT 22.4*  PLT 285   ------------------------------------------------------------------------------------------------------------------  Chemistries  Recent  Labs  Lab 12/25/17 0927 12/26/17 0627  NA 142 148*  K 3.9 4.0  CL 111 116*  CO2 23 26  GLUCOSE 139* 116*  BUN 47* 45*  CREATININE 1.44* 1.41*  CALCIUM 8.2* 8.5*  AST 25  --   ALT 17  --   ALKPHOS 58  --   BILITOT 0.7  --    ------------------------------------------------------------------------------------------------------------------  Cardiac Enzymes No results for input(s): TROPONINI in the last 168 hours. ------------------------------------------------------------------------------------------------------------------  RADIOLOGY:  No results found.   ASSESSMENT AND PLAN:   77 year old male with past medical history of hypertension, gout, dementia, history of previous CVA status post PEG tube placement, chronic kidney disease stage III who presented to the hospital due to abdominal pain, fever noted to have sepsis secondary to acute cholecystitis.  1.  Sepsis- patient meets criteria due to leukocytosis, CT scan suggestive of acute cholecystitis with abnormal LFTs and fever. - Patient being managed medically for now with IV antibiotics, pain control, anti-diuretics. - Patient does have intermittent hypotension which has resolved and improved with IV fluids.  2.  Acute cholecystitis- source of patient's sepsis.  Seen by general surgery and they do not think the patient is a good surgical candidate.  He has not improved with supportive therapy with IV fluids and IV Zosyn as patient continues to have relative hypotension along with leukocytosis. - Discussed with interventional radiology Dr. Fredia SorrowYamagata and plan for percutaneous cholecystostomy tube placement tomorrow. -Follow LFTs.  3.  Acute kidney injury-secondary to the sepsis and acute cholecystitis. -Improving with IV fluid hydration.  4.  Urinary tract infection-based off a urinalysis.  Urine cultures positive for E. coli which is  pansensitive.  Patient is already on Zosyn.  5.  Hyperlipidemia-continue  atorvastatin.  6.  Glaucoma-continue latanoprost eyedrops.  7.  hx of previous CVA- continue to hold aspirin and Plavix as plan for percutaneous cholecystostomy tube tomorrow. Cont. Atorvastatin  8. Dementia - will resume namenda  All the records are reviewed and case discussed with Care Management/Social Worker. Management plans discussed with the patient, family and they are in agreement.  CODE STATUS: Full code  DVT Prophylaxis: Ted's & SCD's.   TOTAL TIME TAKING CARE OF THIS PATIENT: 30 minutes.   POSSIBLE D/C IN 2-3 DAYS, DEPENDING ON CLINICAL CONDITION.   Houston Siren M.D on 12/26/2017 at 2:22 PM  Between 7am to 6pm - Pager - [phone]  After 6pm go to [email or web address] - Social research officer, government  [facility] [place] [facility]  Office  [phone]  CC: Primary care physician; Garnet Koyanagi, FNP

## 2017-12-27 ENCOUNTER — Inpatient Hospital Stay: Payer: Medicare Other

## 2017-12-27 LAB — GLUCOSE, CAPILLARY
GLUCOSE-CAPILLARY: 101 mg/dL — AB (ref 70–99)
GLUCOSE-CAPILLARY: 110 mg/dL — AB (ref 70–99)
GLUCOSE-CAPILLARY: 128 mg/dL — AB (ref 70–99)
Glucose-Capillary: 119 mg/dL — ABNORMAL HIGH (ref 70–99)
Glucose-Capillary: 127 mg/dL — ABNORMAL HIGH (ref 70–99)

## 2017-12-27 LAB — CBC
HEMATOCRIT: 23 % — AB (ref 40.0–52.0)
HEMOGLOBIN: 7.6 g/dL — AB (ref 13.0–18.0)
MCH: 26.9 pg (ref 26.0–34.0)
MCHC: 33.1 g/dL (ref 32.0–36.0)
MCV: 81.3 fL (ref 80.0–100.0)
Platelets: 316 10*3/uL (ref 150–440)
RBC: 2.83 MIL/uL — ABNORMAL LOW (ref 4.40–5.90)
RDW: 16.4 % — ABNORMAL HIGH (ref 11.5–14.5)
WBC: 25.6 10*3/uL — ABNORMAL HIGH (ref 3.8–10.6)

## 2017-12-27 LAB — COMPREHENSIVE METABOLIC PANEL
ALBUMIN: 2.2 g/dL — AB (ref 3.5–5.0)
ALK PHOS: 308 U/L — AB (ref 38–126)
ALT: 236 U/L — ABNORMAL HIGH (ref 0–44)
ANION GAP: 7 (ref 5–15)
AST: 575 U/L — ABNORMAL HIGH (ref 15–41)
BILIRUBIN TOTAL: 1.7 mg/dL — AB (ref 0.3–1.2)
BUN: 41 mg/dL — ABNORMAL HIGH (ref 8–23)
CO2: 24 mmol/L (ref 22–32)
Calcium: 8.6 mg/dL — ABNORMAL LOW (ref 8.9–10.3)
Chloride: 115 mmol/L — ABNORMAL HIGH (ref 98–111)
Creatinine, Ser: 1.38 mg/dL — ABNORMAL HIGH (ref 0.61–1.24)
GFR calc Af Amer: 55 mL/min — ABNORMAL LOW (ref >60)
GFR calc non Af Amer: 48 mL/min — ABNORMAL LOW (ref >60)
GLUCOSE: 117 mg/dL — AB (ref 70–99)
Potassium: 3.6 mmol/L (ref 3.5–5.1)
Sodium: 146 mmol/L — ABNORMAL HIGH (ref 135–145)
Total Protein: 5.8 g/dL — ABNORMAL LOW (ref 6.5–8.1)

## 2017-12-27 MED ORDER — SODIUM CHLORIDE 0.9% FLUSH
10.0000 mL | Freq: Two times a day (BID) | INTRAVENOUS | Status: DC
  Administered 2017-12-27 – 2018-01-03 (×15): 10 mL

## 2017-12-27 MED ORDER — MIDAZOLAM HCL 5 MG/5ML IJ SOLN
INTRAMUSCULAR | Status: AC | PRN
  Administered 2017-12-27: 2 mg via INTRAVENOUS
  Administered 2017-12-27: 0.5 mg via INTRAVENOUS

## 2017-12-27 MED ORDER — FENTANYL CITRATE (PF) 100 MCG/2ML IJ SOLN
INTRAMUSCULAR | Status: AC | PRN
  Administered 2017-12-27: 25 ug via INTRAVENOUS
  Administered 2017-12-27: 50 ug via INTRAVENOUS

## 2017-12-27 MED ORDER — MIDAZOLAM HCL 5 MG/5ML IJ SOLN
INTRAMUSCULAR | Status: AC
  Filled 2017-12-27: qty 10

## 2017-12-27 MED ORDER — LIDOCAINE HCL (PF) 1 % IJ SOLN
INTRAMUSCULAR | Status: AC | PRN
  Administered 2017-12-27: 10 mL

## 2017-12-27 MED ORDER — FENTANYL CITRATE (PF) 100 MCG/2ML IJ SOLN
INTRAMUSCULAR | Status: AC
  Filled 2017-12-27: qty 4

## 2017-12-27 NOTE — Progress Notes (Signed)
[place] [facility] [place] NAME: Noah Bush    MR#:  [id number]  DATE OF BIRTH:  1940-07-01  SUBJECTIVE:   Patient is status post percutaneous cholecystostomy tube done by interventional radiology today.  Patient was seen earlier prior to the procedure and was still complaining of some vague abdominal pain and nausea.  REVIEW OF SYSTEMS:    Review of Systems  Gastrointestinal: Positive for abdominal pain.    Nutrition: clear liquids Tolerating Diet: Yes Tolerating PT: Bedbound  DRUG ALLERGIES:  No Known Allergies  VITALS:  Blood pressure 100/72, pulse (!) 105, temperature 98.2 F (36.8 C), temperature source Oral, resp. rate 19, height _0  (1.803 m), weight 113.4 kg (250 lb), SpO2 94 %.  PHYSICAL EXAMINATION:   Physical Exam  GENERAL:  77 y.o.-year-old patient lying in bed in mild GI distress.  EYES: Pupils equal, round, reactive to light and accommodation. No scleral icterus. Extraocular muscles intact.  HEENT: Head atraumatic, normocephalic. Oropharynx and nasopharynx clear.  NECK:  Supple, no jugular venous distention. No thyroid enlargement, no tenderness.  LUNGS: Normal breath sounds bilaterally, no wheezing, rales, rhonchi. No use of accessory muscles of respiration.  CARDIOVASCULAR: S1, S2 normal. No murmurs, rubs, or gallops.  ABDOMEN: Soft, Tender in RUQ/epigastric area, slightly distended. Bowel sounds present. No organomegaly or mass. + PEG tube in place.  EXTREMITIES: No cyanosis, clubbing or edema b/l.    NEUROLOGIC: Cranial nerves II through XII are intact. No focal Motor or sensory deficits b/l.  Globally weak.  PSYCHIATRIC: The patient is alert and oriented x 1.  SKIN: No obvious rash, lesion, or ulcer.    LABORATORY PANEL:   CBC Recent Labs  Lab 12/27/17 0637  WBC 25.6*  HGB 7.6*  HCT 23.0*  PLT 316    ------------------------------------------------------------------------------------------------------------------  Chemistries  Recent Labs  Lab 12/27/17 0637  NA 146*  K 3.6  CL 115*  CO2 24  GLUCOSE 117*  BUN 41*  CREATININE 1.38*  CALCIUM 8.6*  AST 575*  ALT 236*  ALKPHOS 308*  BILITOT 1.7*   ------------------------------------------------------------------------------------------------------------------  Cardiac Enzymes No results for input(s): TROPONINI in the last 168 hours. ------------------------------------------------------------------------------------------------------------------  RADIOLOGY:  Ct Perc Cholecystostomy  Result Date: 12/27/2017 CLINICAL DATA:  Acute cholecystitis and contraindication to cholecystectomy currently. The patient requires percutaneous cholecystostomy. EXAM: CT GUIDED PERCUTANEOUS CHOLECYSTOSTOMY ANESTHESIA/SEDATION: 2.5 mg IV Versed 75 mcg IV Fentanyl Total Moderate Sedation Time:  26 minutes The patient's level of consciousness and physiologic status were continuously monitored during the procedure by Radiology nursing. PROCEDURE: The procedure, risks, benefits, and alternatives were explained to the patient's wife. Questions regarding the procedure were encouraged and answered. The patient's wife understands and consents to the procedure. A time out was performed prior to initiating the procedure. The right abdominal wall was prepped with chlorhexidine in a sterile fashion, and a sterile drape was applied covering the operative field. A sterile gown and sterile gloves were used for the procedure. Local anesthesia was provided with 1% Lidocaine. CT was performed in a supine position. Under CT guidance, an 18 gauge trocar needle was advanced into the lumen of the gallbladder. A bile sample was withdrawn and sent for culture analysis. A guidewire was advanced into the gallbladder. The percutaneous tract was dilated over the wire. A 10 French  pigtail drainage catheter was advanced over the wire and formed. Drainage catheter position was confirmed by CT. The drain was then flushed with saline and connected to a gravity drainage  bag. The drain was secured at the skin with a Prolene retention suture and adhesive StatLock device. COMPLICATIONS: None FINDINGS: Aspiration of bile at the level of the gallbladder demonstrates dark black colored bile. A sample was sent for culture. CT again demonstrates gallbladder distension, cholelithiasis and inflammation surrounding the gallbladder. After cholecystostomy tube placement within the gallbladder lumen, there is good return of bile. IMPRESSION: CT-guided cholecystostomy tube placement. A 10 French catheter was placed in the gallbladder lumen and connected to gravity bag drainage. A bile sample was sent for culture analysis. Electronically Signed   By: Aletta Edouard M.D.   On: 12/27/2017 14:05     ASSESSMENT AND PLAN:   77 year old male with past medical history of hypertension, gout, dementia, history of previous CVA status post PEG tube placement, chronic kidney disease stage III who presented to the hospital due to abdominal pain, fever noted to have sepsis secondary to acute cholecystitis.  1.  Sepsis- patient met criteria due to leukocytosis, CT scan suggestive of acute cholecystitis with abnormal LFTs and fever. - discussed with IR and gen. Surgery yesterday and s/p IR guided cholecystostomy tube placement today.  - cont. IV Zosyn empirically for now.   2.  Acute cholecystitis- source of patient's sepsis.  Seen by general surgery and they do not think the patient is a good surgical candidate.  He has not improved with supportive therapy with IV fluids and IV Zosyn as patient continues to have abnormal LFT's with elevated Bili and Leukocytosis.  - Discussed with interventional radiology Dr. Kathlene Cote and plan for percutaneous cholecystostomy tube placement today.   -Follow LFTs. Cont. Empiric  Zosyn.    3.  Acute kidney injury-secondary to the sepsis and acute cholecystitis. -Improving with IV fluid hydration and will cont. To monitor.     4.  Urinary tract infection-based off a urinalysis.  Urine cultures positive for E. coli which is pansensitive.  Patient is already on Zosyn.  5.  Hyperlipidemia-continue atorvastatin.  6.  Glaucoma-continue latanoprost eyedrops.  7.  hx of previous CVA- cont. Statin and will resume ASA and plavix tomorrow.   8. Dementia - cont. namenda  All the records are reviewed and case discussed with Care Management/Social Worker. Management plans discussed with the patient, family and they are in agreement.  CODE STATUS: Full code  DVT Prophylaxis: Ted's & SCD's.   TOTAL TIME TAKING CARE OF THIS PATIENT: 30 minutes.   POSSIBLE D/C IN 2-3 DAYS, DEPENDING ON CLINICAL CONDITION.   Henreitta Leber M.D on 12/27/2017 at 2:49 PM  Between 7am to 6pm - Pager - [phone]  After 6pm go to [email or web address] - Proofreader  [facility] [place] [facility]  Office  [phone]  CC: Primary care physician; Novella Rob, FNP

## 2017-12-27 NOTE — Progress Notes (Signed)
Patient ID: Noah RumpfJames Turnbaugh Jr., male   DOB: 11-29-1940, 77 y.o.   MRN: [id number]     SURGICAL PROGRESS NOTE   Hospital Day(s): 4.   Post op day(s):  Marland Kitchen.   Interval History: Patient seen and examined, no acute events or new complaints overnight. Nurse report no change of patient's status. No family bedside.  Vital signs in last 24 hours: [min-max] current  Temp:  [97.9 F (36.6 C)-98.6 F (37 C)] 98.6 F (37 C) (07/28 0603) Pulse Rate:  [69-107] 107 (07/28 0603) Resp:  [16] 16 (07/28 0603) BP: (106-136)/(63-82) 106/72 (07/28 0603) SpO2:  [91 %-100 %] 100 % (07/28 0603)     Height: 5\' 11"  (180.3 cm) Weight: 113.4 kg (250 lb) BMI (Calculated): 34.88   Physical Exam:  Constitutional: alert, cooperative and no distress  Gastrointestinal: soft, moderate-tender on epigastric and right upper quadrant, and non-distended  Labs:  CBC Latest Ref Rng & Units 12/27/2017 12/26/2017 12/25/2017  WBC 3.8 - 10.6 K/uL 25.6(H) 25.2(H) 28.1(H)  Hemoglobin 13.0 - 18.0 g/dL 7.6(L) 7.5(L) 8.6(L)  Hematocrit 40.0 - 52.0 % 23.0(L) 22.4(L) 26.0(L)  Platelets 150 - 440 K/uL 316 285 304   CMP Latest Ref Rng & Units 12/27/2017 12/26/2017 12/25/2017  Glucose 70 - 99 mg/dL 841(L117(H) 244(W116(H) 102(V139(H)  BUN 8 - 23 mg/dL 25(D41(H) 66(Y45(H) 40(H47(H)  Creatinine 0.61 - 1.24 mg/dL 4.74(Q1.38(H) 5.95(G1.41(H) 3.87(F1.44(H)  Sodium 135 - 145 mmol/L 146(H) 148(H) 142  Potassium 3.5 - 5.1 mmol/L 3.6 4.0 3.9  Chloride 98 - 111 mmol/L 115(H) 116(H) 111  CO2 22 - 32 mmol/L 24 26 23   Calcium 8.9 - 10.3 mg/dL 6.4(P8.6(L) 3.2(R8.5(L) 5.1(O8.2(L)  Total Protein 6.5 - 8.1 g/dL 8.4(Z5.8(L) - 5.7(L)  Total Bilirubin 0.3 - 1.2 mg/dL 6.6(A1.7(H) - 0.7  Alkaline Phos 38 - 126 U/L 308(H) - 58  AST 15 - 41 U/L 575(H) - 25  ALT 0 - 44 U/L 236(H) - 17     Imaging studies: No new pertinent imaging studies   Assessment/Plan:  77 y.o.malewith acute cholecystitis, complicated by pertinent comorbidities includingsevere dementia, alzheimer, stroke 4 months ago on plavix and aspirin, acute over  chronic kidney injury. Still elevated WBC count. There is no fever. Continue with abdominal pain. Continue IV abx therapy, agree with percutaneous cholecystostomy.  Gae GallopEdgardo Cintrn-Daz, MD

## 2017-12-27 NOTE — Progress Notes (Signed)
Initial Nutrition Assessment  DOCUMENTATION CODES:   Obesity unspecified  INTERVENTION:  Will monitor for diet advancement.  Once advanced past clear liquids recommend Ensure Enlive po TID, each supplement provides 350 kcal and 20 grams of protein.  Provide 1:1 assistance at meals.  Will monitor intake and ability for patient to meet calorie/protein needs with diet advancement and oral nutrition supplements.  If patient does need to resume G-tube feeding as a supplement to PO intake in the future, would not see any issue with that as per CT Abdomen/Pelvis on 12/23/2017 G-tube is appropriately positioned in stomach. However, would need to consider goals of care.  NUTRITION DIAGNOSIS:   Inadequate oral intake related to (dementia, hx CVA 07/2017, dysphagia, acutely cholecystitis with abdominal pain) as evidenced by per patient/family report.  GOAL:   Patient will meet greater than or equal to 90% of their needs  MONITOR:   PO intake, Supplement acceptance, Diet advancement, Labs, Weight trends, I & O's  REASON FOR ASSESSMENT:   Malnutrition Screening Tool    ASSESSMENT:   77 year old male with PMHx of Alzheimer's dementia, gout, HTN, CKD stage III, CVA 07/23/2017 s/p G-tube placement 08/13/2017 who presented with abdominal pain and fever found to have sepsis secondary to acute cholecystitis s/p CT guided placement of percutaneous cholecystostomy tube on 7/28.   -Patient was NPO on admission and then placed on dysphagia 1 diet with nectar-thick liquids on 7/26. -Following SLP evaluation on 7/27 diet was upgraded to dysphagia 2 with thin liquids. -Currently on clear liquids following procedure today.  Met with patient at bedside following percutaneous cholecystostomy tube placement. He is unable to provide any history in setting of dementia. RN and CNA report patient still has G-tube in place. On assessment appears to be a 20 Fr. Endovive Safety gastrostomy tube with C-clamp. The  G-tube was to suction prior to procedure and there is concern about using tube again for any feedings. However, according to CT Abdomen/Pelvis from 7/24 the G-tube is appropriately positioned in stomach. There is mention of patient possibly needing TPN, but as long as he tolerates an oral diet do not see any indication for TPN. Any supplementation needed above what patient is able to meet from PO intake can be provided per G-tube. If for some reason he is unable to tolerate oral diet related to acute cholecystitis, TPN could be considered at that time.  Discussed with patient's son Clearence Vitug over the phone. He reports patient has not needed the G-tube for tube feeding for a few months now. After leaving CIR he went to [facility] but most recently has been living at home with his wife. He has been doing well with PO intake at home. He eats "mashed, soft" food. Son emphasizes that patient will not feed himself and he needs 1:1 assistance at meals to eat well.  Limited weight history in chart. Patient was 255.7 lbs on 08/25/2017. Unsure if current weight is accurate as it appears to be a stated weight.  Medications reviewed and include: Novolog 0-9 units Q6hrs, LR @ 50 mL/hr, Zosyn.  Labs reviewed: CBG 101-119, Sodium 146, Chloride 115, BUN 41, Creatinine 1.38.  Patient is at risk for malnutrition.  NUTRITION - FOCUSED PHYSICAL EXAM:    Most Recent Value  Orbital Region  No depletion  Upper Arm Region  No depletion  Thoracic and Lumbar Region  No depletion  Buccal Region  No depletion  Temple Region  Mild depletion  Clavicle Bone Region  Mild  depletion  Clavicle and Acromion Bone Region  Mild depletion  Scapular Bone Region  Unable to assess  Dorsal Hand  No depletion  Patellar Region  No depletion  Anterior Thigh Region  No depletion  Posterior Calf Region  No depletion  Edema (RD Assessment)  -- [non-pitting to bilateral lower extremities]  Hair  Reviewed  Eyes  Reviewed  Mouth   Unable to assess  Skin  Reviewed  Nails  Reviewed     Diet Order:   Diet Order           Diet clear liquid Room service appropriate? Yes; Fluid consistency: Thin  Diet effective now          EDUCATION NEEDS:   Not appropriate for education at this time  Skin:  Skin Assessment: Reviewed RN Assessment  Last BM:  12/26/2017 - smear  Height:   Ht Readings from Last 1 Encounters:  12/27/17 5' 11"  (1.803 m)    Weight:   Wt Readings from Last 1 Encounters:  12/27/17 250 lb (113.4 kg)    Ideal Body Weight:  78.2 kg  BMI:  Body mass index is 34.87 kg/m.  Estimated Nutritional Needs:   Kcal:  2075-2260 (MSJ x 1.1-1.2)  Protein:  100-115 grams (0.9-1 grams/kg)  Fluid:  2-2.2 L/day (1 mL/kcal)  Willey Blade, MS, RD, LDN Office: [phone] Pager: [phone] After Hours/Weekend Pager: [phone]

## 2017-12-27 NOTE — Procedures (Signed)
Interventional Radiology Procedure Note  Procedure: CT guided percutaneous cholecystostomy  Complications: None  Estimated Blood Loss: < 10 mL  Findings: 10 Fr drain placed in gallbladder with return of dark bile.  Sample sent for culture.  Drain connected to gravity bag.  Jodi MarbleGlenn T. Fredia SorrowYamagata, M.D Pager:  [phone]

## 2017-12-27 NOTE — Progress Notes (Signed)
PT Cancellation Note  Patient Details Name: Bobby RumpfJames Montalvo Jr. MRN: [id number] DOB: 07/04/1940   Cancelled Treatment:    Reason Eval/Treat Not Completed: Medical issues which prohibited therapy.  Order received.  Chart reviewed.  Consulted RN and pt is recovering from a procedure as well as slightly tachycardic.  Will be more appropriate tomorrow.   Glenetta HewSarah Viney Acocella, PT, DPT 12/27/2017, 2:42 PM

## 2017-12-27 NOTE — Progress Notes (Signed)
Patient ID: Bobby RumpfJames Llera Jr., male   DOB: Sep 23, 1940, 77 y.o.   MRN: [id number]   Interventional Radiology  Consulted by Dr. Cherlynn KaiserSainani yesterday for percutaneous cholecystostomy to treat acute cholecystitis.  CT clearly shows evidence of cholecystitis and GB distention.  Could not perform procedure yesterday as patient accidentally fed ice cream.  Consent obtained by phone from patient's wife for perc choly tube placement.  Will proceed with procedure today under CT guidance, given low lying GB with adjacent bowel by prior CT.   Jodi MarbleGlenn T. Fredia SorrowYamagata, M.D Pager:  [phone]

## 2017-12-27 NOTE — Care Management (Signed)
RNCM consulted with family to discuss discharge planning. Patient has dementia and was unable to participate in conversation. Spoke with son [name] [phone] about discharge. Patient currently lives at home with his wife and she is his primary caregiver, however she recently had a knee replacement and it has been difficult for her to provide the level of help he needs. Patient has fallen a lot frequently and the spouse typically just calls Noah to help the patient back up.Patient just completed time in CIR and at [facility]. Family feels it would be very beneficial to have PT and nursing services in the home. Patient currently uses no DME.  At this time we will order a PT consult to await recommendations and begin workup for home health services. Family used an agency about a year ago but can not remember the name, Noah would like to talk to his mother, the patients spouse, to see if she remembers who was used. I was not able to find documentation of home health in the past.  [name] RN BSN RNCM [phone]

## 2017-12-28 DIAGNOSIS — Z515 Encounter for palliative care: Secondary | ICD-10-CM

## 2017-12-28 LAB — COMPREHENSIVE METABOLIC PANEL
ALBUMIN: 2.2 g/dL — AB (ref 3.5–5.0)
ALT: 310 U/L — AB (ref 0–44)
AST: 801 U/L — AB (ref 15–41)
Alkaline Phosphatase: 381 U/L — ABNORMAL HIGH (ref 38–126)
Anion gap: 9 (ref 5–15)
BUN: 40 mg/dL — AB (ref 8–23)
CO2: 26 mmol/L (ref 22–32)
CREATININE: 1.46 mg/dL — AB (ref 0.61–1.24)
Calcium: 8.8 mg/dL — ABNORMAL LOW (ref 8.9–10.3)
Chloride: 115 mmol/L — ABNORMAL HIGH (ref 98–111)
GFR calc Af Amer: 52 mL/min — ABNORMAL LOW (ref >60)
GFR, EST NON AFRICAN AMERICAN: 45 mL/min — AB (ref >60)
GLUCOSE: 116 mg/dL — AB (ref 70–99)
POTASSIUM: 3.4 mmol/L — AB (ref 3.5–5.1)
Sodium: 150 mmol/L — ABNORMAL HIGH (ref 135–145)
Total Bilirubin: 1.6 mg/dL — ABNORMAL HIGH (ref 0.3–1.2)
Total Protein: 6.1 g/dL — ABNORMAL LOW (ref 6.5–8.1)

## 2017-12-28 LAB — CBC
HEMATOCRIT: 21.9 % — AB (ref 40.0–52.0)
Hemoglobin: 7.5 g/dL — ABNORMAL LOW (ref 13.0–18.0)
MCH: 27.6 pg (ref 26.0–34.0)
MCHC: 34.3 g/dL (ref 32.0–36.0)
MCV: 80.4 fL (ref 80.0–100.0)
PLATELETS: 324 10*3/uL (ref 150–440)
RBC: 2.72 MIL/uL — ABNORMAL LOW (ref 4.40–5.90)
RDW: 16.8 % — AB (ref 11.5–14.5)
WBC: 15.8 10*3/uL — AB (ref 3.8–10.6)

## 2017-12-28 LAB — GLUCOSE, CAPILLARY
Glucose-Capillary: 96 mg/dL (ref 70–99)
Glucose-Capillary: 141 mg/dL — ABNORMAL HIGH (ref 70–99)
Glucose-Capillary: 126 mg/dL — ABNORMAL HIGH (ref 70–99)

## 2017-12-28 LAB — CULTURE, BLOOD (ROUTINE X 2)
Culture: NO GROWTH
Culture: NO GROWTH
SPECIAL REQUESTS: ADEQUATE

## 2017-12-28 MED ORDER — POTASSIUM CL IN DEXTROSE 5% 20 MEQ/L IV SOLN
20.0000 meq | INTRAVENOUS | Status: DC
  Administered 2017-12-28 – 2017-12-30 (×3): 20 meq via INTRAVENOUS
  Filled 2017-12-28 (×7): qty 1000

## 2017-12-28 MED ORDER — ADULT MULTIVITAMIN W/MINERALS CH
1.0000 | ORAL_TABLET | Freq: Every day | ORAL | Status: DC
  Administered 2017-12-28 – 2018-01-03 (×7): 1 via ORAL
  Filled 2017-12-28 (×7): qty 1

## 2017-12-28 MED ORDER — CLOPIDOGREL BISULFATE 75 MG PO TABS
75.0000 mg | ORAL_TABLET | Freq: Every day | ORAL | Status: DC
  Administered 2017-12-28 – 2018-01-03 (×7): 75 mg via ORAL
  Filled 2017-12-28 (×7): qty 1

## 2017-12-28 MED ORDER — ASPIRIN EC 81 MG PO TBEC
81.0000 mg | DELAYED_RELEASE_TABLET | Freq: Every day | ORAL | Status: DC
  Administered 2017-12-28 – 2018-01-03 (×7): 81 mg via ORAL
  Filled 2017-12-28 (×7): qty 1

## 2017-12-28 MED ORDER — ENSURE ENLIVE PO LIQD
237.0000 mL | Freq: Three times a day (TID) | ORAL | Status: DC
  Administered 2017-12-28 – 2018-01-02 (×15): 237 mL via ORAL

## 2017-12-28 NOTE — Progress Notes (Signed)
[facility] NAME: Noah Bush    MR#:  [id number]  DATE OF BIRTH:  June 02, 1941  SUBJECTIVE:   Patient is status post percutaneous cholecystostomy tube done by interventional radiology POD # 1.   Patient still complaining of some abdominal pain.  Seen by speech therapy and tolerated a soft diet well.  Patient's LFTs are still abnormal, sodium noted to be elevated to 150.  REVIEW OF SYSTEMS:    Review of Systems  Gastrointestinal: Positive for abdominal pain.    Nutrition: Dysphagia II with thin liquids Tolerating Diet: Yes Tolerating PT: Bedbound  DRUG ALLERGIES:  No Known Allergies  VITALS:  Blood pressure 103/77, pulse (!) 54, temperature 98.9 F (37.2 C), temperature source Oral, resp. rate 16, height 5' 11"  (1.803 m), weight 113.4 kg (250 lb), SpO2 92 %.  PHYSICAL EXAMINATION:   Physical Exam  GENERAL:  77 y.o.-year-old patient lying in bed lethargic but follows commands    EYES: Pupils equal, round, reactive to light and accommodation. No scleral icterus. Extraocular muscles intact.  HEENT: Head atraumatic, normocephalic. Oropharynx and nasopharynx clear.  NECK:  Supple, no jugular venous distention. No thyroid enlargement, no tenderness.  LUNGS: Normal breath sounds bilaterally, no wheezing, rales, rhonchi. No use of accessory muscles of respiration.  CARDIOVASCULAR: S1, S2 normal. No murmurs, rubs, or gallops.  ABDOMEN: Soft, Tender in RUQ/epigastric area, slightly distended. Bowel sounds present. No organomegaly or mass. + PEG tube in place.  Positive right-sided cholecystostomy tube in place with bilious drainage noted. EXTREMITIES: No cyanosis, clubbing or edema b/l.    NEUROLOGIC: Cranial nerves II through XII are intact. No focal Motor or sensory deficits b/l.  Globally weak.  PSYCHIATRIC: The patient is alert and oriented x 1.  SKIN: No obvious rash, lesion, or ulcer.    LABORATORY PANEL:   CBC Recent Labs   Lab 12/28/17 0431  WBC 15.8*  HGB 7.5*  HCT 21.9*  PLT 324   ------------------------------------------------------------------------------------------------------------------  Chemistries  Recent Labs  Lab 12/28/17 0431  NA 150*  K 3.4*  CL 115*  CO2 26  GLUCOSE 116*  BUN 40*  CREATININE 1.46*  CALCIUM 8.8*  AST 801*  ALT 310*  ALKPHOS 381*  BILITOT 1.6*   ------------------------------------------------------------------------------------------------------------------  Cardiac Enzymes No results for input(s): TROPONINI in the last 168 hours. ------------------------------------------------------------------------------------------------------------------  RADIOLOGY:  Ct Perc Cholecystostomy  Result Date: 12/27/2017 CLINICAL DATA:  Acute cholecystitis and contraindication to cholecystectomy currently. The patient requires percutaneous cholecystostomy. EXAM: CT GUIDED PERCUTANEOUS CHOLECYSTOSTOMY ANESTHESIA/SEDATION: 2.5 mg IV Versed 75 mcg IV Fentanyl Total Moderate Sedation Time:  26 minutes The patient's level of consciousness and physiologic status were continuously monitored during the procedure by Radiology nursing. PROCEDURE: The procedure, risks, benefits, and alternatives were explained to the patient's wife. Questions regarding the procedure were encouraged and answered. The patient's wife understands and consents to the procedure. A time out was performed prior to initiating the procedure. The right abdominal wall was prepped with chlorhexidine in a sterile fashion, and a sterile drape was applied covering the operative field. A sterile gown and sterile gloves were used for the procedure. Local anesthesia was provided with 1% Lidocaine. CT was performed in a supine position. Under CT guidance, an 18 gauge trocar needle was advanced into the lumen of the gallbladder. A bile sample was withdrawn and sent for culture analysis. A guidewire was advanced into the  gallbladder. The percutaneous tract was dilated over the wire. A 10 [place]  pigtail drainage catheter was advanced over the wire and formed. Drainage catheter position was confirmed by CT. The drain was then flushed with saline and connected to a gravity drainage bag. The drain was secured at the skin with a Prolene retention suture and adhesive StatLock device. COMPLICATIONS: None FINDINGS: Aspiration of bile at the level of the gallbladder demonstrates dark black colored bile. A sample was sent for culture. CT again demonstrates gallbladder distension, cholelithiasis and inflammation surrounding the gallbladder. After cholecystostomy tube placement within the gallbladder lumen, there is good return of bile. IMPRESSION: CT-guided cholecystostomy tube placement. A 10 French catheter was placed in the gallbladder lumen and connected to gravity bag drainage. A bile sample was sent for culture analysis. Electronically Signed   By: Aletta Edouard M.D.   On: 12/27/2017 14:05     ASSESSMENT AND PLAN:   77 year old male with past medical history of hypertension, gout, dementia, history of previous CVA status post PEG tube placement, chronic kidney disease stage III who presented to the hospital due to abdominal pain, fever noted to have sepsis secondary to acute cholecystitis.  1.  Sepsis- patient met criteria due to leukocytosis, CT scan suggestive of acute cholecystitis with abnormal LFTs and fever. -Status post IR cholecystostomy tube placement postop day 1 today.  Afebrile, white cell count has improved, cultures negative. - cont. IV Zosyn empirically for now.   2.  Acute cholecystitis- source of patient's sepsis.  Seen by general surgery and they do not think the patient is a good surgical candidate.  He had not improved with supportive therapy with IV fluids and IV Zosyn as patient continued to have abnormal LFT's with elevated Bili and Leukocytosis.  - Discussed with interventional radiology Dr.  Kathlene Cote and s/p percutaneous cholecystostomy tube placement POD # 1.  -Patient's LFTs are worse today but will continue to monitor.  Continue empiric Zosyn.  3.  Hypernatremia/hypokalemia- Will change IV fluids from LR to D5W with potassium and follow sodium and potassium levels.  4.  Acute kidney injury-secondary to the sepsis and acute cholecystitis. -Improving with IV fluid hydration and will cont. To monitor.     5.  Urinary tract infection-based off a urinalysis.  Urine cultures positive for E. coli which is pansensitive.  Patient is already on Zosyn.  6.  Hyperlipidemia-continue atorvastatin.  7.  Glaucoma-continue latanoprost eyedrops.  7.  hx of previous CVA- cont. ASA, Plavix, Statin.    8. Dementia - cont. namenda  Due to patient's multiple comorbidities patient has a poor prognosis and will get palliative care to discuss goals of care.  All the records are reviewed and case discussed with Care Management/Social Worker. Management plans discussed with the patient, family and they are in agreement.  CODE STATUS: Full code  DVT Prophylaxis: Ted's & SCD's.   TOTAL TIME TAKING CARE OF THIS PATIENT: 30 minutes.   POSSIBLE D/C IN 2-3 DAYS, DEPENDING ON CLINICAL CONDITION.   Henreitta Leber M.D on 12/28/2017 at 2:18 PM  Between 7am to 6pm - Pager - [phone]  After 6pm go to [email or web address] - Proofreader  [facility]  Office  [phone]  CC: Primary care physician; Novella Rob, FNP

## 2017-12-28 NOTE — Progress Notes (Signed)
Speech Language Pathology Treatment: Dysphagia  Patient Details Name: Noah RumpfJames Salsbury Jr. MRN: [id number] DOB: 12/04/1940 Today's Date: 12/28/2017 Time: [id number] SLP Time Calculation (min) (ACUTE ONLY): 42 min  Assessment / Plan / Recommendation Clinical Impression  Pt seen for ongoing assessment of toleration of diet; safe upgrade of diet to least restrictive consistency. Pt is currently tolerating a clear liquid diet; MD gave ok to advance diet. Pt has a baseline of Dementia; recent R MCA w/ left neglect. Pt verbally conversive to answer basic questions re: self today.  Pt positioned upright and given trials of thin liquids, puree, and softened solids(cut). Pt exhibited min impulsive drinking behaviors but this was monitored by SLP by pinching the straw. Education given to slow down when drinking. Pt then consumed trials of puree/softened solids fed to him by SLP. No overt s/s of aspiration were noted during all the trials; clear vocal quality noted b/t trials and no decline in respiratory status b/t trials. Pt's oral phase was grossly [name] for the consistencies given - min increased oral phase time w/ the softened solids but able to clear given time. Encouraged pt to alternate b/t foods/liquids in order to aid oral clearing as well. Pt does require mod-max assistance w/ eating/drinking - he did not initiate the self-feeding task or assist in it w/ SLP easily. Pt required mod. Verbal cues for follow through w/ po tasks.  Due to pt's baseline Cognitive status of Dementia, dependent feeding status, Left neglect deficits from the recent CVA, and missing dentition, pt remains at increased risk for dysphagia/aspiration. W/ general aspiration precautions in place, pt appears to tolerate a modified oral diet adequately and would benefit from drink consistency supplement w/ the Dietitian. Recommend precautions; Pills in puree; a Dysphagia level 2(MINCED foods) w/ thin liquids diet. ST services will continue to  f/u and be available for any further education while pt is admitted.    HPI HPI: Pt is a 77 y.o. male who presents to ED with vomiting. 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  He was found here in the ED on imaging to have acute cholecystitis, and meet sepsis criteria.  He also likely has a UTI.  He was started on antibiotics. Patient is now status post percutaneous cholecystostomy tube done by interventional radiology POD # 1. Patient still complaining of some abdominal pain. Pt tolerated initial po trials during BSE w/ upgrade to clear liquid diet. MD indicating ok to advance to foods in diet today.  OF NOTE, pt has a PEG placed currently post admission for the R MCA in 07/2017. He indicated it is not used at home prior to this admission. Pt verbalized he ate "bologna, ham" at home but that no one gave him TFs via the PEG at home.       SLP Plan  Continue with current plan of care       Recommendations  Diet recommendations: Dysphagia 2 (fine chop);Thin liquid(w/ purees) Liquids provided via: Cup;Straw(monitor for any impulsivity) Medication Administration: Crushed with puree Supervision: Staff to assist with self feeding;Full supervision/cueing for compensatory strategies Compensations: Minimize environmental distractions;Slow rate;Small sips/bites;Lingual sweep for clearance of pocketing;Multiple dry swallows after each bite/sip;Follow solids with liquid Postural Changes and/or Swallow Maneuvers: Seated upright 90 degrees;Upright 30-60 min after meal  General recommendations: (Dietician following) Oral Care Recommendations: Oral care BID;Staff/trained caregiver to provide oral care Follow up Recommendations: None(pt appears at his baseline per  reports) SLP Visit Diagnosis: Dysphagia, oral phase (R13.11)(poor dentition status; Dementia) Plan: Continue with current plan of care       GO                Jerilynn Som, MS, CCC-SLP Noah Bush 12/28/2017, 2:55 PM

## 2017-12-28 NOTE — Progress Notes (Signed)
Patient ID: Noah RumpfJames Koehler Jr., male   DOB: [birth year], 77 y.o.   MRN: [id number]     SURGICAL PROGRESS NOTE   Hospital Day(s): 5.   Post op day(s):  Marland Kitchen.   Interval History: Patient seen and examined, no acute events or new complaints overnight. Patient poor historian, unable to get any information today.   Vital signs in last 24 hours: [min-max] current  Temp:  [98.6 F (37 C)-98.9 F (37.2 C)] 98.9 F (37.2 C) (07/29 0616) Pulse Rate:  [54-88] 54 (07/29 1159) Resp:  [16-20] 16 (07/29 1159) BP: (102-104)/(67-78) 103/77 (07/29 1159) SpO2:  [61 %-100 %] 92 % (07/29 1159)     Height: 5\' 11"  (180.3 cm) Weight: 113.4 kg (250 lb) BMI (Calculated): 34.88    Physical Exam:  Constitutional: alert, cooperative and no distress  Gastrointestinal: soft, moderate-tender to palpation on right upper quadrant, and non-distended. Drain in place with dark bile.   Labs:  CBC Latest Ref Rng & Units 12/28/2017 12/27/2017 12/26/2017  WBC 3.8 - 10.6 K/uL 15.8(H) 25.6(H) 25.2(H)  Hemoglobin 13.0 - 18.0 g/dL 7.5(L) 7.6(L) 7.5(L)  Hematocrit 40.0 - 52.0 % 21.9(L) 23.0(L) 22.4(L)  Platelets 150 - 440 K/uL 324 316 285   CMP Latest Ref Rng & Units 12/28/2017 12/27/2017 12/26/2017  Glucose 70 - 99 mg/dL 409(W116(H) 119(J117(H) 478(G116(H)  BUN 8 - 23 mg/dL 95(A40(H) 21(H41(H) 08(M45(H)  Creatinine 0.61 - 1.24 mg/dL 5.78(I1.46(H) 6.96(E1.38(H) 9.52(W1.41(H)  Sodium 135 - 145 mmol/L 150(H) 146(H) 148(H)  Potassium 3.5 - 5.1 mmol/L 3.4(L) 3.6 4.0  Chloride 98 - 111 mmol/L 115(H) 115(H) 116(H)  CO2 22 - 32 mmol/L 26 24 26   Calcium 8.9 - 10.3 mg/dL 4.1(L8.8(L) 2.4(M8.6(L) 0.1(U8.5(L)  Total Protein 6.5 - 8.1 g/dL 6.1(L) 5.8(L) -  Total Bilirubin 0.3 - 1.2 mg/dL 2.7(O1.6(H) 5.3(G1.7(H) -  Alkaline Phos 38 - 126 U/L 381(H) 308(H) -  AST 15 - 41 U/L 801(H) 575(H) -  ALT 0 - 44 U/L 310(H) 236(H) -    Imaging studies: No new pertinent imaging studies   Assessment/Plan:  77 y.o.malewith acute cholecystitis, complicated by pertinent comorbidities includingsevere dementia, alzheimer,  stroke 4 months ago on plavix and aspirin, acute over chronic kidney injury. Percutaneous cholecystostomy done yesterday. Today with improved WBC count. Agree to continue abx therapy. When pain improves may progress diet.   Gae GallopEdgardo Cintrn-Daz, MD

## 2017-12-28 NOTE — Care Management Important Message (Signed)
Son signed initial IM left in room on 12/25/17.  Original sent to HIM to be scanned into chart.  Copy of signed IM left in room for reference on 12/28/17.

## 2017-12-28 NOTE — Evaluation (Signed)
Physical Therapy Evaluation Patient Details Name: Noah RumpfJames Branham Jr. 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 Patient is status post percutaneous cholecystostomy tube.  Clinical Impression  Pt presents non-alert and difficult to arouse. Pt is A+O x 1, and can intermittently follow single step commands. Pt has active contraction in all major muscle groups of BUE and BLE. However pt requires therapist assistance to move all extremities through partial and full range. Pt L side weakness >R and Left side facial drooping, 2/2 PMH of CVA leading to pt being admitted to CIR previously. Pt requires total assist 2+ for supine to/from sit, and is unable to demonstrate any active sitting balance, or self righting reactions. Would benefit from skilled PT as pt medical condition improves for continued physical evaluation and intervention to return pt to PLOF. Recommend transition to STR upon discharge from acute hospitalization.     Follow Up Recommendations SNF    Equipment Recommendations       Recommendations for Other Services       Precautions / Restrictions Precautions Precautions: Fall Restrictions Weight Bearing Restrictions: No Other Position/Activity Restrictions: Pt has cholecystostomy tube       Mobility  Bed Mobility Overal bed mobility: Needs Assistance Bed Mobility: Supine to Sit     Supine to sit: Total assist;+2 for physical assistance     General bed mobility comments: Pt unable to assist with supine to sit, needs toatl assist for trunk flexion and moving legs off bed  Transfers                 General transfer comment: Unable to performed any  transfer--would be Total assist sliding transfer to chair, chair lift would be ideal  Ambulation/Gait                Stairs            Wheelchair Mobility    Modified Rankin (Stroke Patients Only)       Balance Overall balance assessment: Needs assistance Sitting-balance support: Bilateral upper extremity supported;Feet supported Sitting balance-Leahy Scale: Zero Sitting balance - Comments: Pt is unable to sit without total assist 2+, has no righting reactions  Postural control: Posterior lean     Standing balance comment: Unable to assess due to pt inability to stand                             Pertinent Vitals/Pain Pain Assessment: Faces Faces Pain Scale: Hurts little more Pain Location: BLE with movement Pain Descriptors / Indicators: Grimacing;Guarding Pain Intervention(s): Limited activity within patient's tolerance;Monitored during session;Repositioned    Home Living Family/patient expects to be discharged to:: Private residence Living Arrangements: Spouse/significant other;Children               Additional Comments: Pt unable to participate in conversation with PT due to dementia and confusion. Per chart review pt did live with wife in private residence, pt wife recently had knee surgery and is having increased difficulty with care taking. Pt family not present at time of eval, all above info per chart review    Prior Function  Comments: Pt unable to answer pertinent questions related to prior levle of function, will plan to follwo up with family at later date     Hand Dominance        Extremity/Trunk Assessment   Upper Extremity Assessment Upper Extremity Assessment: (Pt BUE=active contraction in all major muscle groups. 3/5 MMT shoulder ext/flex)    Lower Extremity Assessment Lower Extremity Assessment: (BLE= active contraction in all major muscle groups, unable to actviely flex hip against gravity without  therapist assist)       Communication   Communication: Other (comment)(Difficulty 2/2 dementia)  Cognition Arousal/Alertness: Lethargic Behavior During Therapy: Flat affect Overall Cognitive Status: History of cognitive impairments - at baseline                                 General Comments: Pt unable to be consistently alert, A+Ox1 (self), pt can occasionally follow single step commands       General Comments      Exercises Other Exercises Other Exercises: AAROM: Shoulder Flex/Ext x10, Elbow flex/ext x10 Hip Flex/ext x 10. Passive ROM knee flex/ext x 2 (pt resist movement) Other Exercises: Bed mobility: Supine to sit total assist 2+   Assessment/Plan    PT Assessment Patient needs continued PT services  PT Problem List Decreased strength;Decreased mobility;Decreased range of motion;Decreased coordination;Decreased activity tolerance;Decreased cognition;Decreased balance;Decreased knowledge of use of DME;Decreased safety awareness;Impaired sensation;Pain       PT Treatment Interventions DME instruction;Therapeutic activities;Therapeutic exercise;Balance training;Functional mobility training;Neuromuscular re-education;Gait training;Patient/family education    PT Goals (Current goals can be found in the Care Plan section)  Acute Rehab PT Goals PT Goal Formulation: Patient unable to participate in goal setting    Frequency Min 2X/week   Barriers to discharge        Co-evaluation               AM-PAC PT "6 Clicks" Daily Activity  Outcome Measure Difficulty turning over in bed (including adjusting bedclothes, sheets and blankets)?: Unable Difficulty moving from lying on back to sitting on the side of the bed? : Unable Difficulty sitting down on and standing up from a chair with arms (e.g., wheelchair, bedside commode, etc,.)?: Unable Help needed moving to and from a bed to chair (including a wheelchair)?: Total Help needed walking in hospital room?:  Total Help needed climbing 3-5 steps with a railing? : Total 6 Click Score: 6    End of Session Equipment Utilized During Treatment: Oxygen Activity Tolerance: Patient limited by lethargy;Treatment limited secondary to medical complications (Comment);Patient limited by fatigue Patient left: in bed;with bed alarm set;with nursing/sitter in room;with call bell/phone within reach Nurse Communication: Mobility status PT Visit Diagnosis: Muscle weakness (generalized) (M62.81);Unsteadiness on feet (R26.81);Difficulty in walking, not elsewhere classified (R26.2);Hemiplegia and hemiparesis Hemiplegia - Right/Left: Left Hemiplegia - dominant/non-dominant: Non-dominant    Time: 1610-9604 PT Time Calculation (min) (ACUTE ONLY): 25 min   Charges:   PT Evaluation $PT Eval Moderate Complexity: 1 Mod          [employer], SPT 12/28/17,11:23 AM

## 2017-12-28 NOTE — Consult Note (Signed)
Consultation Note Date: 12/28/2017   Patient Name: Noah RumpfJames Catena Jr.  DOB: 11-Feb-1941  MRN: [id number]  Age / Sex: 77 y.o., male  PCP: Garnet KoyanagiJohnson, Julie R, FNP Referring Physician: Houston SirenSainani, Vivek J, MD  Reason for Consultation: Establishing goals of care  HPI/Patient Profile: Noah RilingJames Bush  is a 77 y.o. male who presents with vomiting.  Patient has history of prior stroke and dementia and is unable to contribute much information to his history.  He was found here in the ED on imaging to have acute cholecystitis, and meet sepsis criteria.     Clinical Assessment and Goals of Care: Patient is resting in bed. He is able to state his name, age, location, and that he is here for "stomach problems". He falls asleep during conversation.  Called his wife Dub Mikesnnie Belflower. She states they have been married over 50 years. They have 4 children, 2 of which live in the area. 1 son Noah Bush lives with them. 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   Prior to a stroke 4 months ago, Noah Bush was totally independent and was joyful. Following the stroke, he was discharged to rehab and then home. Noah Bush states a caregiver comes into the home 3x/wk to help her husband.   Upon attempting to discuss GOC, Noah Bush states son Noah Bush is his POA and makes the decisions. She states her husband has a living will, but she does not know what is on it.   Called Noah Bush. He states his mother completed his living will. He states he lives with his parents and his life has been on hold since his father's stroke. He states his father was sent to rehab where all they did was drain their bank account. He states his father did not receive therapy, but di receive a feeding tube. Frustrated, he states his father was sent home from rehab with no home PT/OT services. He states he pays a NT to come in and help him bathe his father.   He states his father wears a diaper  which he changes. He states his father eats and drinks, but still has the feeding tube. He will keep the feeding tube in case he needs it in the future.  He quickly states his father would want everything done needed to keep him alive as long as possible. We discussed quality of life, he states his father has time left, and everything needs to be done for him indicated. Code status discussed, he states "we need to try".      SUMMARY OF RECOMMENDATIONS   Recommend palliative outpatient.    Code Status/Advance Care Planning:  Full code    Symptom Management:   Per primary team.   Palliative Prophylaxis:   Eye Care and Oral Care  Prognosis:   Unable to determine  Discharge Planning: To Be Determined      Primary Diagnoses: Present on Admission: . Diabetes mellitus type 2 in obese (HCC) . Essential hypertension . Hyperlipidemia . Mixed Alzheimer's and vascular dementia . Severe sepsis (HCC) . AKI (  acute kidney injury) (HCC) . UTI (urinary tract infection) . Acute cholecystitis   I have reviewed the medical record, interviewed the patient and family, and examined the patient. The following aspects are pertinent.  Past Medical History:  Diagnosis Date  . Alzheimer's dementia    Noah Bush 07/23/2017  . Cerebral infarction (HCC) 07/27/2017  . Cerebrovascular accident (CVA) (HCC) 07/23/2017  . CKD (chronic kidney disease), stage III (HCC)    Noah Bush 07/23/2017  . Gout   . Hypertension    Social History   Socioeconomic History  . Marital status: Married    Spouse name: Not on file  . Number of children: Not on file  . Years of education: Not on file  . Highest education level: Not on file  Occupational History  . Not on file  Social Needs  . Financial resource strain: Not on file  . Food insecurity:    Worry: Not on file    Inability: Not on file  . Transportation needs:    Medical: Not on file    Non-medical: Not on file  Tobacco Use  . Smoking status: Never  Smoker  . Smokeless tobacco: Never Used  Substance and Sexual Activity  . Alcohol use: Yes    Alcohol/week: 1.2 oz    Types: 2 Cans of beer per week  . Drug use: No  . Sexual activity: Not on file  Lifestyle  . Physical activity:    Days per week: Not on file    Minutes per session: Not on file  . Stress: Not on file  Relationships  . Social connections:    Talks on phone: Not on file    Gets together: Not on file    Attends religious service: Not on file    Active member of club or organization: Not on file    Attends meetings of clubs or organizations: Not on file    Relationship status: Not on file  Other Topics Concern  . Not on file  Social History Narrative  . Not on file   Family History  Problem Relation Age of Onset  . Diabetes Paternal Grandfather    Scheduled Meds: . atorvastatin  80 mg Oral q1800  . feeding supplement (ENSURE ENLIVE)  237 mL Oral TID BM  . insulin aspart  0-9 Units Subcutaneous Q6H  . latanoprost  1 drop Left Eye QHS  . memantine  5 mg Oral Daily  . multivitamin with minerals  1 tablet Oral Daily  . sodium chloride flush  10 mL Intracatheter Q12H   Continuous Infusions: . dextrose 5 % with KCl 20 mEq / L 20 mEq (12/28/17 0820)  . piperacillin-tazobactam (ZOSYN)  IV Stopped (12/28/17 1028)   PRN Meds:.acetaminophen **OR** acetaminophen, ondansetron **OR** ondansetron (ZOFRAN) IV, oxyCODONE Medications Prior to Admission:  Prior to Admission medications   Medication Sig Start Date End Date Taking? Authorizing Provider  acetaminophen (TYLENOL) 325 MG tablet Take 1-2 tablets (325-650 mg total) by mouth every 4 (four) hours as needed for mild pain. 08/24/17  Yes Love, Evlyn Kanner, PA-C  albuterol (PROVENTIL) (2.5 MG/3ML) 0.083% nebulizer solution Take 3 mLs (2.5 mg total) by nebulization every 6 (six) hours as needed for wheezing or shortness of breath. 08/24/17  Yes Love, Evlyn Kanner, PA-C  aspirin EC 81 MG tablet Take 81 mg by mouth daily.   Yes  [provider]  atorvastatin (LIPITOR) 80 MG tablet Take 1 tablet (80 mg total) by mouth daily at 6 PM. Patient taking differently:  Take 40 mg by mouth daily at 6 PM.  07/28/17  Yes Garnette Gunner, MD  clopidogrel (PLAVIX) 75 MG tablet Take 1 tablet (75 mg total) by mouth daily. 08/25/17  Yes Love, Evlyn Kanner, PA-C  latanoprost (XALATAN) 0.005 % ophthalmic solution Place 1 drop into the left eye at bedtime. 08/24/17  Yes Love, Evlyn Kanner, PA-C  memantine (NAMENDA) 5 MG tablet Take 5 mg by mouth daily.   Yes [provider]  Menthol-Methyl Salicylate (MUSCLE RUB) 10-15 % CREA Apply 1 application topically 4 (four) times daily. To bilateral hands and bilateral knees 08/24/17  Yes Love, Evlyn Kanner, PA-C  methylphenidate (RITALIN) 5 MG tablet Take 1 tablet (5 mg total) by mouth 2 (two) times daily with breakfast and lunch. 08/24/17  Yes Love, Evlyn Kanner, PA-C  metoprolol succinate (TOPROL-XL) 25 MG 24 hr tablet Take 0.5 tablets (12.5 mg total) by mouth daily. 07/29/17  Yes Garnette Gunner, MD  multivitamin (RENA-VIT) TABS tablet Take 1 tablet by mouth at bedtime. 08/24/17  Yes Love, Evlyn Kanner, PA-C  Amino Acids-Protein Hydrolys (FEEDING SUPPLEMENT, PRO-STAT SUGAR FREE 64,) LIQD Place 30 mLs into feeding tube 3 (three) times daily between meals. 08/24/17   Love, Evlyn Kanner, PA-C  Nutritional Supplements (FEEDING SUPPLEMENT, OSMOLITE 1.5 CAL,) LIQD Place 280 mLs into feeding tube 4 (four) times daily - after meals and at bedtime. 08/24/17   Love, Evlyn Kanner, PA-C  polyethylene glycol (MIRALAX / GLYCOLAX) packet Take 17 g by mouth 2 (two) times daily. Patient not taking: Reported on 12/23/2017 08/24/17   Love, Evlyn Kanner, PA-C  traZODone (DESYREL) 50 MG tablet Take 0.5-1 tablets (25-50 mg total) by mouth at bedtime as needed for sleep. Patient not taking: Reported on 12/23/2017 08/24/17   Love, Evlyn Kanner, PA-C  Water For Irrigation, Sterile (FREE WATER) SOLN Place 200 mLs into feeding tube 4 (four) times daily.  08/24/17   Love, Evlyn Kanner, PA-C  Water For Irrigation, Sterile (FREE WATER) SOLN Place 250 mLs into feeding tube 4 (four) times daily. 08/25/17   Love, Evlyn Kanner, PA-C   No Known Allergies Review of Systems  Gastrointestinal: Positive for abdominal pain.    Physical Exam  Constitutional: No distress.  Pulmonary/Chest: Effort normal.  Neurological:  Alert, then falls asleep in conversation.   Skin: Skin is warm and dry.    Vital Signs: BP 103/77   Pulse (!) 54   Temp 98.9 F (37.2 C) (Oral)   Resp 16   Ht 5\' 11"  (1.803 m)   Wt 113.4 kg (250 lb)   SpO2 92%   BMI 34.87 kg/m  Pain Scale: 0-10 POSS *See Group Information*: S-Acceptable,Sleep, easy to arouse Pain Score: 0-No pain   SpO2: SpO2: 92 % O2 Device:SpO2: 92 % O2 Flow Rate: .O2 Flow Rate (L/min): 2 L/min  IO: Intake/output summary:   Intake/Output Summary (Last 24 hours) at 12/28/2017 1418 Last data filed at 12/28/2017 1000 Gross per 24 hour  Intake 1632.67 ml  Output 175 ml  Net 1457.67 ml    LBM: Last BM Date: 12/24/17 Baseline Weight: Weight: 113.4 kg (250 lb) Most recent weight: Weight: 113.4 kg (250 lb)     Palliative Assessment/Data: 30%     Time In: 1:30 Time Out: 2:20 Time Total: 50 min Greater than 50%  of this time was spent counseling and coordinating care related to the above assessment and plan.  Signed by: Morton Stall, NP   Please contact Palliative Medicine Team phone at [phone] for  questions and concerns.  For individual provider: See Amion             

## 2017-12-29 LAB — PHOSPHORUS: PHOSPHORUS: 2.7 mg/dL (ref 2.5–4.6)

## 2017-12-29 LAB — CBC
HCT: 24.9 % — ABNORMAL LOW (ref 40.0–52.0)
Hemoglobin: 8.4 g/dL — ABNORMAL LOW (ref 13.0–18.0)
MCH: 27.2 pg (ref 26.0–34.0)
MCHC: 33.5 g/dL (ref 32.0–36.0)
MCV: 81.2 fL (ref 80.0–100.0)
PLATELETS: 393 10*3/uL (ref 150–440)
RBC: 3.07 MIL/uL — AB (ref 4.40–5.90)
RDW: 17 % — ABNORMAL HIGH (ref 11.5–14.5)
WBC: 16.5 10*3/uL — AB (ref 3.8–10.6)

## 2017-12-29 LAB — COMPREHENSIVE METABOLIC PANEL
ALT: 236 U/L — AB (ref 0–44)
AST: 425 U/L — ABNORMAL HIGH (ref 15–41)
Albumin: 2.4 g/dL — ABNORMAL LOW (ref 3.5–5.0)
Alkaline Phosphatase: 349 U/L — ABNORMAL HIGH (ref 38–126)
Anion gap: 9 (ref 5–15)
BUN: 32 mg/dL — ABNORMAL HIGH (ref 8–23)
CALCIUM: 8.7 mg/dL — AB (ref 8.9–10.3)
CHLORIDE: 110 mmol/L (ref 98–111)
CO2: 26 mmol/L (ref 22–32)
CREATININE: 1.43 mg/dL — AB (ref 0.61–1.24)
GFR, EST AFRICAN AMERICAN: 53 mL/min — AB (ref >60)
GFR, EST NON AFRICAN AMERICAN: 46 mL/min — AB (ref >60)
Glucose, Bld: 152 mg/dL — ABNORMAL HIGH (ref 70–99)
Potassium: 3.4 mmol/L — ABNORMAL LOW (ref 3.5–5.1)
Sodium: 145 mmol/L (ref 135–145)
TOTAL PROTEIN: 6.4 g/dL — AB (ref 6.5–8.1)
Total Bilirubin: 0.9 mg/dL (ref 0.3–1.2)

## 2017-12-29 LAB — GLUCOSE, CAPILLARY
GLUCOSE-CAPILLARY: 115 mg/dL — AB (ref 70–99)
GLUCOSE-CAPILLARY: 117 mg/dL — AB (ref 70–99)
GLUCOSE-CAPILLARY: 140 mg/dL — AB (ref 70–99)
Glucose-Capillary: 133 mg/dL — ABNORMAL HIGH (ref 70–99)

## 2017-12-29 LAB — MAGNESIUM: Magnesium: 2.1 mg/dL (ref 1.7–2.4)

## 2017-12-29 NOTE — Progress Notes (Signed)
New referral for outpatient Palliative to follow at home [facility]. Patient will be followed by [facility] home health. No discharge date at time of referral. Patient information faxed to referral. Dayna BarkerKaren Robertson RN, BSN, [facility], hospital Liaison [phone]

## 2017-12-29 NOTE — Progress Notes (Signed)
[place] [facility] [place] NAME: Noah Bush    MR#:  [id number]  DATE OF BIRTH:  1941-04-16  SUBJECTIVE:   Patient is status post percutaneous cholecystostomy tube done by interventional radiology POD # 2.   Patient still complaining of some abdominal pain.  LFT's improved today and Hypernatremia has improved.   REVIEW OF SYSTEMS:    Review of Systems  Gastrointestinal: Positive for abdominal pain.    Nutrition: Dysphagia II with thin liquids Tolerating Diet: Yes Tolerating PT: Bedbound  DRUG ALLERGIES:  No Known Allergies  VITALS:  Blood pressure 107/73, pulse 87, temperature 97.7 F (36.5 C), temperature source Oral, resp. rate 18, height 5' 11"  (1.803 m), weight 113.4 kg (250 lb), SpO2 100 %.  PHYSICAL EXAMINATION:   Physical Exam  GENERAL:  77 y.o.-year-old patient lying in bed lethargic but follows commands    EYES: Pupils equal, round, reactive to light and accommodation. No scleral icterus. Extraocular muscles intact.  HEENT: Head atraumatic, normocephalic. Oropharynx and nasopharynx clear.  NECK:  Supple, no jugular venous distention. No thyroid enlargement, no tenderness.  LUNGS: Normal breath sounds bilaterally, no wheezing, rales, rhonchi. No use of accessory muscles of respiration.  CARDIOVASCULAR: S1, S2 normal. No murmurs, rubs, or gallops.  ABDOMEN: Soft, Tender in RUQ/epigastric area, slightly distended. Bowel sounds present. No organomegaly or mass. + PEG tube in place.  Positive right-sided cholecystostomy tube in place with bilious drainage noted. EXTREMITIES: No cyanosis, clubbing, +1-2 edema b/l.    NEUROLOGIC: Cranial nerves II through XII are intact. No focal Motor or sensory deficits b/l.  Globally weak.  PSYCHIATRIC: The patient is alert and oriented x 1.  SKIN: No obvious rash, lesion, or ulcer.    LABORATORY PANEL:   CBC Recent Labs  Lab 12/29/17 0448  WBC 16.5*  HGB 8.4*  HCT 24.9*  PLT 393    ------------------------------------------------------------------------------------------------------------------  Chemistries  Recent Labs  Lab 12/29/17 0448  NA 145  K 3.4*  CL 110  CO2 26  GLUCOSE 152*  BUN 32*  CREATININE 1.43*  CALCIUM 8.7*  MG 2.1  AST 425*  ALT 236*  ALKPHOS 349*  BILITOT 0.9   ------------------------------------------------------------------------------------------------------------------  Cardiac Enzymes No results for input(s): TROPONINI in the last 168 hours. ------------------------------------------------------------------------------------------------------------------  RADIOLOGY:  No results found.   ASSESSMENT AND PLAN:   77 year old male with past medical history of hypertension, gout, dementia, history of previous CVA status post PEG tube placement, chronic kidney disease stage III who presented to the hospital due to abdominal pain, fever noted to have sepsis secondary to acute cholecystitis.  1.  Sepsis- patient met criteria due to leukocytosis, CT scan suggestive of acute cholecystitis with abnormal LFTs and fever. -Status post IR cholecystostomy tube placement postop day 2 today.  Afebrile, WBC count has trended down.  - cont. IV Zosyn empirically for now.   2.  Acute cholecystitis- source of patient's sepsis.   Status post IR guided cholecystostomy tube placement postop day #2 today.  Afebrile, white cell count has improved.  Continue empiric Zosyn, appreciate surgical input.  Patient tolerating p.o. Well. - Follow LFT's which are improving.   3.  Hypernatremia/hypokalemia- to need D5W and potassium supplementation and sodium and potassium levels are improving.  4.  Acute kidney injury-secondary to the sepsis and acute cholecystitis. -Improving with IV fluid hydration and Cr. Close to baseline.   5.  Urinary tract infection-based off a urinalysis.  Urine cultures positive for E. coli which is  pansensitive.  Patient is  already on Zosyn and has been adequately treated.   6.  Hyperlipidemia-continue atorvastatin.  7.  Glaucoma-continue latanoprost eyedrops.  8.  hx of previous CVA- cont. ASA, Plavix, Statin.    9. Dementia - cont. namenda  Prognosis is quite poor given his multiple comorbidities, recurrent hospitalizations.  Palliative care consult was obtained to discuss goals of care.  Of care spoke to patient's son who is the patient's POA along with the wife and they want to pursue aggressive care for now.  Patient remains a full code.  All the records are reviewed and case discussed with Care Management/Social Worker. Management plans discussed with the patient, family and they are in agreement.  CODE STATUS: Full code  DVT Prophylaxis: Ted's & SCD's.   TOTAL TIME TAKING CARE OF THIS PATIENT: 30 minutes.   POSSIBLE D/C IN 2-3 DAYS, DEPENDING ON CLINICAL CONDITION.   Henreitta Leber M.D on 12/29/2017 at 2:49 PM  Between 7am to 6pm - Pager - [phone]  After 6pm go to [email or web address] - Proofreader  [facility]  Office  [phone]  CC: Primary care physician; Novella Rob, FNP

## 2017-12-29 NOTE — Progress Notes (Signed)
Physical Therapy Treatment Patient Details Name: Noah Bush. 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 Patient is status post percutaneous cholecystostomy tube.    PT Comments    Pt refused OOB or mobility skills stating "I am too sore for that" but declines offer to request pain medication from nursing.  Participated in exercises as described below.  Will continue as appropriate.  Follow Up Recommendations  SNF     Equipment Recommendations       Recommendations for Other Services       Precautions / Restrictions Precautions Precautions: Fall Restrictions Weight Bearing Restrictions: No Other Position/Activity Restrictions: Pt has cholecystostomy tube     Mobility  Bed Mobility               General bed mobility comments: refused "I'm too sore for that"  Transfers                    Ambulation/Gait                 Stairs             Wheelchair Mobility    Modified Rankin (Stroke Patients Only)       Balance                                            Cognition Arousal/Alertness: Lethargic;Awake/alert Behavior During Therapy: WFL for tasks assessed/performed Overall Cognitive Status: History of cognitive impairments - at baseline                                        Exercises Other Exercises Other Exercises: Agreed to BLE AAROM for ankle pumps, heel slides, ab/add and SLR 2 x 10    General Comments        Pertinent Vitals/Pain Pain Assessment: Faces Faces Pain Scale: Hurts even more Pain Location: with BLE movement and complaints of chole insertion site Pain  Descriptors / Indicators: Grimacing;Guarding Pain Intervention(s): Limited activity within patient's tolerance;Monitored during session    Home Living                      Prior Function            PT Goals (current goals can now be found in the care plan section) Progress towards PT goals: Progressing toward goals    Frequency    Min 2X/week      PT Plan Current plan remains appropriate    Co-evaluation              AM-PAC PT "6 Clicks" Daily Activity  Outcome Measure  Difficulty turning over in bed (including adjusting bedclothes, sheets and blankets)?: Unable Difficulty moving from lying on back to sitting on the side of the bed? : Unable Difficulty sitting down on and standing up from a chair with arms (e.g., wheelchair, bedside commode, etc,.)?: Unable Help needed moving to and from a bed to chair (including a wheelchair)?: Total Help needed  walking in hospital room?: Total Help needed climbing 3-5 steps with a railing? : Total 6 Click Score: 6    End of Session Equipment Utilized During Treatment: Oxygen Activity Tolerance: Patient tolerated treatment well Patient left: in bed;with bed alarm set;with call bell/phone within reach   Hemiplegia - Right/Left: Left Hemiplegia - dominant/non-dominant: Non-dominant     Time: [id number] PT Time Calculation (min) (ACUTE ONLY): 14 min  Charges:  $Therapeutic Exercise: 8-22 mins                    Danielle DessSarah Ander Wamser, PTA 12/29/17, 11:47 AM

## 2017-12-29 NOTE — Progress Notes (Signed)
Patient ID: Noah RumpfJames Calvo Jr., male   DOB: [birth year], 77 y.o.   MRN: [id number]     SURGICAL PROGRESS NOTE   Hospital Day(s): 6.   Post op day(s):  Marland Kitchen.   Interval History: Patient seen and examined, no acute events or new complaints overnight. Patient reports feeling pain. Denies nausea or vomiting with oral intake. Difficult to take good history.   Vital signs in last 24 hours: [min-max] current  Temp:  [97.4 F (36.3 C)-98.6 F (37 C)] 97.7 F (36.5 C) (07/30 1336) Pulse Rate:  [85-96] 87 (07/30 1336) Resp:  [16-20] 18 (07/30 1336) BP: (60-124)/(39-82) 107/73 (07/30 1336) SpO2:  [83 %-100 %] 100 % (07/30 1336)     Height: 5\' 11"  (180.3 cm) Weight: 113.4 kg (250 lb) BMI (Calculated): 34.88    Physical Exam:  Constitutional: alert, cooperative and no distress  Respiratory: breathing non-labored at rest  Cardiovascular: regular rate and sinus rhythm  Gastrointestinal: soft, mild-tender to deep palpation, and non-distended  Labs:  CBC Latest Ref Rng & Units 12/29/2017 12/28/2017 12/27/2017  WBC 3.8 - 10.6 K/uL 16.5(H) 15.8(H) 25.6(H)  Hemoglobin 13.0 - 18.0 g/dL 7.8(G8.4(L) 7.5(L) 7.6(L)  Hematocrit 40.0 - 52.0 % 24.9(L) 21.9(L) 23.0(L)  Platelets 150 - 440 K/uL 393 324 316   CMP Latest Ref Rng & Units 12/29/2017 12/28/2017 12/27/2017  Glucose 70 - 99 mg/dL 956(O152(H) 130(Q116(H) 657(Q117(H)  BUN 8 - 23 mg/dL 46(N32(H) 62(X40(H) 52(W41(H)  Creatinine 0.61 - 1.24 mg/dL 4.13(K1.43(H) 4.40(N1.46(H) 0.27(O1.38(H)  Sodium 135 - 145 mmol/L 145 150(H) 146(H)  Potassium 3.5 - 5.1 mmol/L 3.4(L) 3.4(L) 3.6  Chloride 98 - 111 mmol/L 110 115(H) 115(H)  CO2 22 - 32 mmol/L 26 26 24   Calcium 8.9 - 10.3 mg/dL 5.3(G8.7(L) 6.4(Q8.8(L) 0.3(K8.6(L)  Total Protein 6.5 - 8.1 g/dL 6.4(L) 6.1(L) 5.8(L)  Total Bilirubin 0.3 - 1.2 mg/dL 0.9 7.4(Q1.6(H) 5.9(D1.7(H)  Alkaline Phos 38 - 126 U/L 349(H) 381(H) 308(H)  AST 15 - 41 U/L 425(H) 801(H) 575(H)  ALT 0 - 44 U/L 236(H) 310(H) 236(H)   Imaging studies: No new pertinent imaging studies   Assessment/Plan:  77 y.o.malewith  acute cholecystitis, complicated by pertinent comorbidities includingsevere dementia, alzheimer, stroke 4 months ago on plavix and aspirin, acute over chronic kidney injury. Patient with no significant change on WBC today. Normal bilirubin. Still some tenderness on palpation on the right upper quadrant to deep palpation. Tolerating diet. Agree to continue abx therapy.     Gae GallopEdgardo Cintrn-Daz, MD

## 2017-12-29 NOTE — Care Management (Signed)
Patient admitted from home with sepsis.  status post percutaneous cholecystostomy tube done by interventional radiology POD # 2.  Patient lives at home with wife, and son.  PCP is Judee ClaraJulie Johnson, who does doctors making house calls. Patient is bed bound status.  Wife states that patient has a Nurse, adulthoyer lift, WC, walker, hospital bed and BSC in the home. Palliative has discussed disposition with family and they have chosen for patient to return home with home health services and outpatient palliative.  Per wife and son they do not have a preference of agency for home health.  [facility].  Patient will need Rn, PT, OT, aide, and CSW.  [facility] palliative [facility].  Patient will need EMS transportation home

## 2017-12-30 LAB — RETICULOCYTES
RBC.: 2.4 MIL/uL — ABNORMAL LOW (ref 4.40–5.90)
RETIC COUNT ABSOLUTE: 48 10*3/uL (ref 19.0–183.0)
RETIC CT PCT: 2 % (ref 0.4–3.1)

## 2017-12-30 LAB — CBC
HCT: 20.9 % — ABNORMAL LOW (ref 40.0–52.0)
Hemoglobin: 7 g/dL — ABNORMAL LOW (ref 13.0–18.0)
MCH: 27 pg (ref 26.0–34.0)
MCHC: 33.4 g/dL (ref 32.0–36.0)
MCV: 80.9 fL (ref 80.0–100.0)
PLATELETS: 378 10*3/uL (ref 150–440)
RBC: 2.58 MIL/uL — ABNORMAL LOW (ref 4.40–5.90)
RDW: 16.7 % — AB (ref 11.5–14.5)
WBC: 16.4 10*3/uL — ABNORMAL HIGH (ref 3.8–10.6)

## 2017-12-30 LAB — GLUCOSE, CAPILLARY
GLUCOSE-CAPILLARY: 145 mg/dL — AB (ref 70–99)
GLUCOSE-CAPILLARY: 151 mg/dL — AB (ref 70–99)
GLUCOSE-CAPILLARY: 97 mg/dL (ref 70–99)
Glucose-Capillary: 120 mg/dL — ABNORMAL HIGH (ref 70–99)

## 2017-12-30 LAB — VITAMIN B12: Vitamin B-12: 1402 pg/mL — ABNORMAL HIGH (ref 180–914)

## 2017-12-30 LAB — COMPREHENSIVE METABOLIC PANEL
ALBUMIN: 2.1 g/dL — AB (ref 3.5–5.0)
ALT: 141 U/L — ABNORMAL HIGH (ref 0–44)
ANION GAP: 6 (ref 5–15)
AST: 168 U/L — ABNORMAL HIGH (ref 15–41)
Alkaline Phosphatase: 258 U/L — ABNORMAL HIGH (ref 38–126)
BILIRUBIN TOTAL: 0.5 mg/dL (ref 0.3–1.2)
BUN: 27 mg/dL — ABNORMAL HIGH (ref 8–23)
CALCIUM: 8.3 mg/dL — AB (ref 8.9–10.3)
CO2: 25 mmol/L (ref 22–32)
Chloride: 110 mmol/L (ref 98–111)
Creatinine, Ser: 1.36 mg/dL — ABNORMAL HIGH (ref 0.61–1.24)
GFR calc Af Amer: 56 mL/min — ABNORMAL LOW (ref >60)
GFR, EST NON AFRICAN AMERICAN: 49 mL/min — AB (ref >60)
Glucose, Bld: 98 mg/dL (ref 70–99)
Potassium: 3.6 mmol/L (ref 3.5–5.1)
Sodium: 141 mmol/L (ref 135–145)
Total Protein: 5.6 g/dL — ABNORMAL LOW (ref 6.5–8.1)

## 2017-12-30 LAB — IRON AND TIBC
Iron: 32 ug/dL — ABNORMAL LOW (ref 45–182)
Saturation Ratios: 26 % (ref 17.9–39.5)
TIBC: 125 ug/dL — ABNORMAL LOW (ref 250–450)
UIBC: 93 ug/dL

## 2017-12-30 LAB — PREPARE RBC (CROSSMATCH)

## 2017-12-30 LAB — ABO/RH: ABO/RH(D): O POS

## 2017-12-30 LAB — FOLATE: Folate: 15.8 ng/mL (ref >5.9)

## 2017-12-30 LAB — FERRITIN: FERRITIN: 1449 ng/mL — AB (ref 24–336)

## 2017-12-30 MED ORDER — POTASSIUM CL IN DEXTROSE 5% 20 MEQ/L IV SOLN
20.0000 meq | INTRAVENOUS | Status: DC
  Administered 2017-12-30 – 2018-01-01 (×2): 20 meq via INTRAVENOUS
  Filled 2017-12-30 (×4): qty 1000

## 2017-12-30 MED ORDER — SODIUM CHLORIDE 0.9% IV SOLUTION
Freq: Once | INTRAVENOUS | Status: AC
  Administered 2017-12-30: 15:00:00 via INTRAVENOUS

## 2017-12-30 NOTE — Progress Notes (Signed)
[facility] [place] NAME: Noah Bush    MR#:  [id number]  DATE OF BIRTH:  09/05/1940  SUBJECTIVE:   Patient is status post percutaneous cholecystostomy tube done by interventional radiology POD # 3.  LFT's have improved.  Pt. Still complaining of some vague abdominal pain. Eating only about 25% of his meals.  Pt's Hg. Has drifted down to 7.0 today.   REVIEW OF SYSTEMS:    Review of Systems  Gastrointestinal: Positive for abdominal pain.    Nutrition: Dysphagia II with thin liquids Tolerating Diet: Yes about 25% of meals.  Tolerating PT: Bedbound  DRUG ALLERGIES:  No Known Allergies  VITALS:  Blood pressure 101/70, pulse 80, temperature 98.7 F (37.1 C), temperature source Oral, resp. rate (!) 22, height 5' 11"  (1.803 m), weight 113.4 kg (250 lb), SpO2 100 %.  PHYSICAL EXAMINATION:   Physical Exam  GENERAL:  77 y.o.-year-old obese patient lying in bed lethargic but follows commands    EYES: Pupils equal, round, reactive to light. No scleral icterus. Extraocular muscles intact.  HEENT: Head atraumatic, normocephalic. Oropharynx and nasopharynx clear.  NECK:  Supple, no jugular venous distention. No thyroid enlargement, no tenderness.  LUNGS: Normal breath sounds bilaterally, no wheezing, rales, rhonchi. No use of accessory muscles of respiration.  CARDIOVASCULAR: S1, S2 normal. No murmurs, rubs, or gallops.  ABDOMEN: Soft, Tender in RUQ/epigastric area. Bowel sounds present. No organomegaly or mass. + PEG tube in place.  Positive right-sided cholecystostomy tube in place with bilious drainage noted. EXTREMITIES: No cyanosis, clubbing, +1-2 edema b/l.    NEUROLOGIC: Cranial nerves II through XII are intact. No focal Motor or sensory deficits b/l.  Globally weak.  PSYCHIATRIC: The patient is alert and oriented x 1.  SKIN: No obvious rash, lesion, or ulcer.    LABORATORY PANEL:   CBC Recent Labs  Lab 12/30/17 0431  WBC 16.4*   HGB 7.0*  HCT 20.9*  PLT 378   ------------------------------------------------------------------------------------------------------------------  Chemistries  Recent Labs  Lab 12/29/17 0448 12/30/17 0431  NA 145 141  K 3.4* 3.6  CL 110 110  CO2 26 25  GLUCOSE 152* 98  BUN 32* 27*  CREATININE 1.43* 1.36*  CALCIUM 8.7* 8.3*  MG 2.1  --   AST 425* 168*  ALT 236* 141*  ALKPHOS 349* 258*  BILITOT 0.9 0.5   ------------------------------------------------------------------------------------------------------------------  Cardiac Enzymes No results for input(s): TROPONINI in the last 168 hours. ------------------------------------------------------------------------------------------------------------------  RADIOLOGY:  No results found.   ASSESSMENT AND PLAN:   77 year old male with past medical history of hypertension, gout, dementia, history of previous CVA status post PEG tube placement, chronic kidney disease stage III who presented to the hospital due to abdominal pain, fever noted to have sepsis secondary to acute cholecystitis.  1.  Sepsis- patient met criteria due to leukocytosis, CT scan suggestive of acute cholecystitis with abnormal LFTs and fever. -Status post IR cholecystostomy tube placement postop day # 3.  Afebrile, WBC count has trended down.  - cont. IV Zosyn empirically for now. BC remain (-).   2.  Acute cholecystitis- source of patient's sepsis.   Status post IR guided cholecystostomy tube placement postop day #3. today.  Afebrile, white cell count has improved.  Continue empiric Zosyn, appreciate surgical input.  Patient tolerating p.o. Well. - Follow LFT's which are improving.   3.  Hypernatremia/hypokalemia- improved w/ D5W and will cont. To monitor.   4.  Acute kidney injury-secondary to the sepsis and  acute cholecystitis. -Improving with IV fluid hydration and Cr. Close to baseline.   5.  Urinary tract infection-based off a urinalysis.  Urine  cultures positive for E. coli which is pansensitive.  Patient is already on Zosyn and has been adequately treated.   6.  Anemia of chronic disease-patient's hemoglobin has drifted down significantly since admission.  Hemoglobin admission was over 12 now down to 7.0.  No acute evidence of blood loss. - We will transfuse 1 unit of packed red blood cells.  Check anemia panel with iron, folate, B12 reticulocyte count and ferritin.  7.  Hyperlipidemia-continue atorvastatin.  8.  Glaucoma-continue latanoprost eyedrops.  9.  hx of previous CVA- cont. ASA, Plavix, Statin.    10. Dementia - cont. namenda  Prognosis is quite poor given his multiple comorbidities, recurrent hospitalizations.  Palliative care consult was obtained to discuss goals of care.  Palliative care spoke to patient's son who is the patient's POA along with the wife and they want to pursue aggressive care for now.  Patient remains a full code.  All the records are reviewed and case discussed with Care Management/Social Worker. Management plans discussed with the patient, family and they are in agreement.  CODE STATUS: Full code  DVT Prophylaxis: Ted's & SCD's.   TOTAL TIME TAKING CARE OF THIS PATIENT: 30 minutes.   POSSIBLE D/C IN 2-3 DAYS, DEPENDING ON CLINICAL CONDITION.   Henreitta Leber M.D on 12/30/2017 at 1:46 PM  Between 7am to 6pm - Pager - [phone]  After 6pm go to [email or web address] - Proofreader  [facility]  Office  [phone]  CC: Primary care physician; Novella Rob, FNP

## 2017-12-30 NOTE — Care Management Important Message (Signed)
Copy of signed IM left with patient in room.  

## 2017-12-30 NOTE — Progress Notes (Signed)
Dr Cherlynn KaiserSainani said to discontinue telemetry

## 2017-12-31 LAB — CBC
HCT: 20.9 % — ABNORMAL LOW (ref 40.0–52.0)
HCT: 27.5 % — ABNORMAL LOW (ref 40.0–52.0)
HEMOGLOBIN: 8.8 g/dL — AB (ref 13.0–18.0)
Hemoglobin: 7 g/dL — ABNORMAL LOW (ref 13.0–18.0)
MCH: 27.6 pg (ref 26.0–34.0)
MCH: 27.4 pg (ref 26.0–34.0)
MCHC: 33.6 g/dL (ref 32.0–36.0)
MCHC: 32.1 g/dL (ref 32.0–36.0)
MCV: 82.1 fL (ref 80.0–100.0)
MCV: 85.5 fL (ref 80.0–100.0)
PLATELETS: 411 10*3/uL (ref 150–440)
Platelets: 367 10*3/uL (ref 150–440)
RBC: 2.55 MIL/uL — ABNORMAL LOW (ref 4.40–5.90)
RBC: 3.22 MIL/uL — ABNORMAL LOW (ref 4.40–5.90)
RDW: 16.5 % — AB (ref 11.5–14.5)
RDW: 16.8 % — AB (ref 11.5–14.5)
WBC: 13.4 10*3/uL — AB (ref 3.8–10.6)
WBC: 14.2 10*3/uL — ABNORMAL HIGH (ref 3.8–10.6)

## 2017-12-31 LAB — COMPREHENSIVE METABOLIC PANEL
ALBUMIN: 2.1 g/dL — AB (ref 3.5–5.0)
ALT: 96 U/L — ABNORMAL HIGH (ref 0–44)
AST: 79 U/L — AB (ref 15–41)
Alkaline Phosphatase: 198 U/L — ABNORMAL HIGH (ref 38–126)
Anion gap: 7 (ref 5–15)
BUN: 25 mg/dL — AB (ref 8–23)
CHLORIDE: 107 mmol/L (ref 98–111)
CO2: 25 mmol/L (ref 22–32)
Calcium: 8.3 mg/dL — ABNORMAL LOW (ref 8.9–10.3)
Creatinine, Ser: 1.5 mg/dL — ABNORMAL HIGH (ref 0.61–1.24)
GFR calc Af Amer: 50 mL/min — ABNORMAL LOW (ref >60)
GFR, EST NON AFRICAN AMERICAN: 43 mL/min — AB (ref >60)
GLUCOSE: 102 mg/dL — AB (ref 70–99)
POTASSIUM: 4.1 mmol/L (ref 3.5–5.1)
Sodium: 139 mmol/L (ref 135–145)
Total Bilirubin: 0.6 mg/dL (ref 0.3–1.2)
Total Protein: 5.5 g/dL — ABNORMAL LOW (ref 6.5–8.1)

## 2017-12-31 LAB — GLUCOSE, CAPILLARY
GLUCOSE-CAPILLARY: 96 mg/dL (ref 70–99)
GLUCOSE-CAPILLARY: 96 mg/dL (ref 70–99)

## 2017-12-31 LAB — PREPARE RBC (CROSSMATCH)

## 2017-12-31 MED ORDER — SODIUM CHLORIDE 0.9% IV SOLUTION
Freq: Once | INTRAVENOUS | Status: AC
  Administered 2017-12-31: 15:00:00 via INTRAVENOUS

## 2017-12-31 MED ORDER — OSMOLITE 1.5 CAL PO LIQD
1000.0000 mL | ORAL | Status: DC
  Administered 2017-12-31: 1000 mL

## 2017-12-31 MED ORDER — PRO-STAT SUGAR FREE PO LIQD
30.0000 mL | Freq: Three times a day (TID) | ORAL | Status: DC
  Administered 2017-12-31 – 2018-01-03 (×8): 30 mL

## 2017-12-31 MED ORDER — FREE WATER
50.0000 mL | Status: DC
  Administered 2017-12-31 – 2018-01-01 (×5): 50 mL

## 2017-12-31 NOTE — Progress Notes (Signed)
[facility] at [place] NAME: Noah Bush    MR#:  [id number]  DATE OF BIRTH:  [birth year]  SUBJECTIVE:   Patient is status post percutaneous cholecystostomy tube done by interventional radiology POD # 4.  LFT's improving.  Hg. Remains at 7.0 but no acute bleeding. PO intake remains poor.   REVIEW OF SYSTEMS:    Review of Systems  Gastrointestinal: Positive for abdominal pain.    Nutrition: Dysphagia II with thin liquids Tolerating Diet: Yes but very little.   Tolerating PT: Bedbound  DRUG ALLERGIES:  No Known Allergies  VITALS:  Blood pressure 116/78, pulse 76, temperature 98.2 F (36.8 C), temperature source Oral, resp. rate 18, height 5' 11"  (1.803 m), weight 113.4 kg (250 lb), SpO2 95 %.  PHYSICAL EXAMINATION:   Physical Exam  GENERAL:  77 y.o.-year-old obese patient lying in bed encephalopathic but follows simple commands.   EYES: Pupils equal, round, reactive to light. No scleral icterus. Extraocular muscles intact.  HEENT: Head atraumatic, normocephalic. Oropharynx and nasopharynx clear.  NECK:  Supple, no jugular venous distention. No thyroid enlargement, no tenderness.  LUNGS: Normal breath sounds bilaterally, no wheezing, rales, rhonchi. No use of accessory muscles of respiration.  CARDIOVASCULAR: S1, S2 normal. No murmurs, rubs, or gallops.  ABDOMEN: Soft, Tender in RUQ/epigastric area. Bowel sounds present. No organomegaly or mass. + PEG tube in place.  Positive right-sided cholecystostomy tube in place with bilious drainage noted. EXTREMITIES: No cyanosis, clubbing, +1-2 edema b/l.    NEUROLOGIC: Cranial nerves II through XII are intact. No focal Motor or sensory deficits b/l.  Globally weak.  PSYCHIATRIC: The patient is alert and oriented x 1.  SKIN: No obvious rash, lesion, or ulcer.    LABORATORY PANEL:   CBC Recent Labs  Lab 12/31/17 0527  WBC 13.4*  HGB 7.0*  HCT 20.9*  PLT 367    ------------------------------------------------------------------------------------------------------------------  Chemistries  Recent Labs  Lab 12/29/17 0448  12/31/17 0527  NA 145   < > 139  K 3.4*   < > 4.1  CL 110   < > 107  CO2 26   < > 25  GLUCOSE 152*   < > 102*  BUN 32*   < > 25*  CREATININE 1.43*   < > 1.50*  CALCIUM 8.7*   < > 8.3*  MG 2.1  --   --   AST 425*   < > 79*  ALT 236*   < > 96*  ALKPHOS 349*   < > 198*  BILITOT 0.9   < > 0.6   < > = values in this interval not displayed.   ------------------------------------------------------------------------------------------------------------------  Cardiac Enzymes No results for input(s): TROPONINI in the last 168 hours. ------------------------------------------------------------------------------------------------------------------  RADIOLOGY:  No results found.   ASSESSMENT AND PLAN:   77 year old male with past medical history of hypertension, gout, dementia, history of previous CVA status post PEG tube placement, chronic kidney disease stage III who presented to the hospital due to abdominal pain, fever noted to have sepsis secondary to acute cholecystitis.  1.  Sepsis- patient met criteria due to leukocytosis, CT scan suggestive of acute cholecystitis with abnormal LFTs and fever. -Status post IR cholecystostomy tube placement postop day # 4.  Afebrile, WBC count has trended down.  - cont. IV zosyn and will treat for a total of 10 days.    2.  Acute cholecystitis- source of patient's sepsis.   Status post IR guided cholecystostomy tube placement  postop day #4. today.  Afebrile, white cell count has improved.  Continue empiric Zosyn, appreciate surgical input.   - tolerating PO but intake is poor.  -- Follow LFT's which are improving.   3.  Hypernatremia/hypokalemia- improved w/ D5W and will cont. To monitor.   4.  Acute kidney injury-secondary to the sepsis and acute cholecystitis. -Improving with  IV fluid hydration and Cr. Close to baseline.   5.  Urinary tract infection-based off a urinalysis.  Urine cultures positive for E. coli which is pansensitive.  Patient is already on Zosyn and has been adequately treated.   6.  Anemia of chronic disease-patient's hemoglobin has drifted down significantly since admission.  Hemoglobin admission was over 12 now down to 7.0.  - Iron studies consistent with Anemia of Chronic disease  - transfused 1 unit yesterday but Hg. Remains at 7.0. Will given another unit today.  No evidence of acute bleeding.   7.  Hyperlipidemia-continue atorvastatin.  8.  Glaucoma-continue latanoprost eyedrops.  9.  hx of previous CVA- cont. ASA, Plavix, Statin.    10. Dementia - cont. namenda  Prognosis is quite poor given his multiple comorbidities, recurrent hospitalizations.  Palliative care consult was obtained to discuss goals of care.  Palliative care spoke to patient's son who is the patient's POA along with the wife and they want to pursue aggressive care for now.  Patient remains a full code.  All the records are reviewed and case discussed with Care Management/Social Worker. Management plans discussed with the patient, family and they are in agreement.  CODE STATUS: Full code  DVT Prophylaxis: Ted's & SCD's.   TOTAL TIME TAKING CARE OF THIS PATIENT: 30 minutes.   POSSIBLE D/C IN 2-3 DAYS, DEPENDING ON CLINICAL CONDITION.   Henreitta Leber M.D on 12/31/2017 at 2:01 PM  Between 7am to 6pm - Pager - [phone]  After 6pm go to [email or web address] - Proofreader  [facility]  Office  [phone]  CC: Primary care physician; Novella Rob, FNP

## 2017-12-31 NOTE — Progress Notes (Signed)
Speech Language Pathology Treatment: Dysphagia  Patient Details Name: Noah Bush. MRN: [id number] DOB: 05/24/1941 Today's Date: 12/31/2017 Time: 4098-1191 SLP Time Calculation (min) (ACUTE ONLY): 25 min  Assessment / Plan / Recommendation Clinical Impression  Pt seen for ongoing assessment of toleration of diet; safe upgrade of diet to least restrictive consistency. Pt is currently tolerating a Dysphagia II diet w/thin liquids but with limited PO intake. Pt has a baseline of Dementia; recent R MCA w/ left neglect. Pt verbally conversive to answer basic questions re: self today, but confused on time of day repeatedly stating it was breakfast and he was going to "go out" for something to eat.  Pt positioned upright and given trials of thin liquids and soft solids (Dys II consistency minced meats). Pt demonstrated no overt s/s of aspiration with 4 ounces of thin by cup. Refused further trials of thin. Pt then consumed one bite of soft solid fed to him by SLP. No overt s/s of aspiration were noted during all the trials; clear vocal quality noted b/t trials and no decline in respiratory status. Pt's oral phase was grossly [facility] for the consistencies given - min increased oral phase time w/ the softened solid but able to clear given time. Pt refused any further trials of soft solid or puree consistencies. Pt stated, "That is not what I eat." Pt requesting bread and pork chops, however this is not an appropriate consistency given oral phase deficits. Educated pt that soft solids are safer consistency for him to consume at this time. Pt does require mod-max assistance w/ eating/drinking - he did not initiate the self-feeding task or assist in it w/ SLP easily.  Due to pt's baseline Cognitive status of Dementia, dependent feeding status, Left neglect deficits from the recent CVA, and missing dentition, pt remains at increased risk for dysphagia/aspiration. W/ general aspiration precautions in place, pt appears  to tolerate a modified oral diet adequately and would benefit from drink consistency supplement w/ the Dietitian. Recommend precautions; Pills in puree; a Dysphagia level 2(MINCED foods) w/ thin liquids diet. ST services will continue to f/u and be available for any further education while pt is admitted.     HPI HPI: Pt is a 77 y.o. male who presents to ED with vomiting. 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  He was found here in the ED on imaging to have acute cholecystitis, and meet sepsis criteria.  He also likely has a UTI.  He was started on antibiotics. Patient is now status post percutaneous cholecystostomy tube done by interventional radiology POD # 1. Patient still complaining of some abdominal pain. Pt tolerated initial po trials during BSE w/ upgrade to clear liquid diet and upgraded to Dys 2 with thin on 7/29.  OF NOTE, pt has a PEG placed currently post admission for the R MCA in 07/2017. He indicated it is not used at home prior to this admission. Pt verbalized he ate "bologna, ham" at home but that no one gave him TFs via the PEG at home.       SLP Plan  Continue with current plan of care       Recommendations  Diet recommendations: Dysphagia 2 (fine chop);Thin liquid Liquids provided via: Cup;Straw Medication Administration: Crushed with puree Supervision: Staff to assist with self  feeding;Full supervision/cueing for compensatory strategies Compensations: Minimize environmental distractions;Slow rate;Small sips/bites;Lingual sweep for clearance of pocketing;Multiple dry swallows after each bite/sip;Follow solids with liquid Postural Changes and/or Swallow Maneuvers: Seated upright 90 degrees;Upright 30-60 min after meal                General  recommendations: (Dietitian following) Oral Care Recommendations: Oral care BID;Staff/trained caregiver to provide oral care Follow up Recommendations: None(Pt appears to be at baseline skills) SLP Visit Diagnosis: Dysphagia, oral phase (R13.11) Plan: Continue with current plan of care       GO              Noah ChandlerMaaran [place], MA, CCC-SLP  Speech-Language Pathologist   [place] 12/31/2017, 12:52 PM

## 2017-12-31 NOTE — Progress Notes (Signed)
Patient with AMS. Very poor appetite PEG tube previously placed and per MD order we will begin tube feeds again since patient is eating only a few bites per meal. Seems to have some mild abdominal pain but it is relieved by prn medications. Biliary drain has put out nearly 425cc this shift. Hgb still just 7.0 post transfusion so MD placed orders for another unit of PRBC's.

## 2017-12-31 NOTE — Progress Notes (Signed)
Patient ID: Noah RumpfJames Riehl Jr., male   DOB: 12/13/1940, 77 y.o.   MRN: [id number]     SURGICAL PROGRESS NOTE   Hospital Day(s): 8.   Post op day(s):  Marland Kitchen.   Interval History: Patient seen and examined, no acute events or new complaints overnight. Unable to take history from patient due to dementia. Report taken from nurse.   Vital signs in last 24 hours: [min-max] current  Temp:  [97.9 F (36.6 C)-98.2 F (36.8 C)] 98.1 F (36.7 C) (08/01 1709) Pulse Rate:  [66-76] 74 (08/01 1709) Resp:  [17-20] 18 (08/01 1709) BP: (98-116)/(68-78) 110/77 (08/01 1709) SpO2:  [95 %-100 %] 96 % (08/01 1709)     Height: 5\' 11"  (180.3 cm) Weight: 113.4 kg (250 lb) BMI (Calculated): 34.88    Physical Exam:  Constitutional: alert, cooperative and no distress  Respiratory: breathing non-labored at rest  Cardiovascular: regular rate and sinus rhythm  Gastrointestinal: soft, mild-tender, and non-distended. Drain in place with bile content.   Labs:  CBC Latest Ref Rng & Units 12/31/2017 12/30/2017 12/29/2017  WBC 3.8 - 10.6 K/uL 13.4(H) 16.4(H) 16.5(H)  Hemoglobin 13.0 - 18.0 g/dL 7.0(L) 7.0(L) 8.4(L)  Hematocrit 40.0 - 52.0 % 20.9(L) 20.9(L) 24.9(L)  Platelets 150 - 440 K/uL 367 378 393   CMP Latest Ref Rng & Units 12/31/2017 12/30/2017 12/29/2017  Glucose 70 - 99 mg/dL 409(W102(H) 98 119(J152(H)  BUN 8 - 23 mg/dL 47(W25(H) 29(F27(H) 62(Z32(H)  Creatinine 0.61 - 1.24 mg/dL 3.08(M1.50(H) 5.78(I1.36(H) 6.96(E1.43(H)  Sodium 135 - 145 mmol/L 139 141 145  Potassium 3.5 - 5.1 mmol/L 4.1 3.6 3.4(L)  Chloride 98 - 111 mmol/L 107 110 110  CO2 22 - 32 mmol/L 25 25 26   Calcium 8.9 - 10.3 mg/dL 8.3(L) 8.3(L) 8.7(L)  Total Protein 6.5 - 8.1 g/dL 9.5(M5.5(L) 5.6(L) 6.4(L)  Total Bilirubin 0.3 - 1.2 mg/dL 0.6 0.5 0.9  Alkaline Phos 38 - 126 U/L 198(H) 258(H) 349(H)  AST 15 - 41 U/L 79(H) 168(H) 425(H)  ALT 0 - 44 U/L 96(H) 141(H) 236(H)    Imaging studies: No new pertinent imaging studies   Assessment/Plan:  77 y.o.malewith acute cholecystitis, complicated by  pertinent comorbidities includingsevere dementia, alzheimer, stroke 4 months ago on plavix and aspirin, acute over chronic kidney injury. Patient continue to improve WBC count, bilirubin normal and improving liver enzymes. I consider that the abdominal physical exam was better today as I deeply palpated the abdomen and the patient did not complain much as previous days. Agree to continue IV abx therapy and follow up CBC trend. Hemoglobin did not improve much after transfusion. Will defer the need of further transfusion for Primary team. No sign of active bleeding. No tachycardia, normal blood pressure. No fever.     Gae GallopEdgardo Cintrn-Daz, MD

## 2017-12-31 NOTE — Progress Notes (Signed)
Daily Progress Note   Patient Name: Noah RumpfJames Wolke Jr.       Date: 12/31/2017 DOB: 07-25-1940  Age: 77 y.o. MRN#: [id number] Attending Physician: Houston SirenSainani, Vivek J, MD Primary Care Physician: Garnet KoyanagiJohnson, Julie R, FNP Admit Date: 12/23/2017  Reason for Consultation/Follow-up: Establishing goals of care  Subjective: Patient resting in bed. Alert, only said a few words that could not be understood. Blood transfusion in progress. Poor oral intake, plan to start tube feeds with current PEG. Declined PT today.   Spoke with son and updated him. Discussed quality of life and current interventions. He states he needs to speak with his mother prior to making decisions.   Length of Stay: 8  Current Medications: Scheduled Meds:  . aspirin EC  81 mg Oral Daily  . atorvastatin  80 mg Oral q1800  . clopidogrel  75 mg Oral Daily  . feeding supplement (ENSURE ENLIVE)  237 mL Oral TID BM  . feeding supplement (OSMOLITE 1.5 CAL)  1,000 mL Per Tube Q24H  . feeding supplement (PRO-STAT SUGAR FREE 64)  30 mL Per Tube TID  . free water  50 mL Per Tube Q4H  . insulin aspart  0-9 Units Subcutaneous Q6H  . latanoprost  1 drop Left Eye QHS  . memantine  5 mg Oral Daily  . multivitamin with minerals  1 tablet Oral Daily  . sodium chloride flush  10 mL Intracatheter Q12H    Continuous Infusions: . dextrose 5 % with KCl 20 mEq / L Stopped (12/31/17 0545)  . piperacillin-tazobactam (ZOSYN)  IV 3.375 g (12/31/17 1503)    PRN Meds: acetaminophen **OR** acetaminophen, ondansetron **OR** ondansetron (ZOFRAN) IV, oxyCODONE  Physical Exam  Constitutional: No distress.  Pulmonary/Chest: Effort normal.  Neurological: He is alert.  Skin: Skin is warm and dry.            Vital Signs: BP 107/73   Pulse 66   Temp 98.2  F (36.8 C) (Oral)   Resp 17   Ht 5\' 11"  (1.803 m)   Wt 113.4 kg (250 lb)   SpO2 100%   BMI 34.87 kg/m  SpO2: SpO2: 100 % O2 Device: O2 Device: Room Air O2 Flow Rate: O2 Flow Rate (L/min): 3 L/min  Intake/output summary:   Intake/Output Summary (Last 24 hours) at 12/31/2017 1546 Last data  filed at 12/31/2017 1440 Gross per 24 hour  Intake 1324.58 ml  Output 425 ml  Net 899.58 ml   LBM: Last BM Date: 12/30/17 Baseline Weight: Weight: 113.4 kg (250 lb) Most recent weight: Weight: 113.4 kg (250 lb)       Palliative Assessment/Data:20%    Flowsheet Rows     Most Recent Value  Intake Tab  Referral Department  Hospitalist  Unit at Time of Referral  Med/Surg Unit  Date Notified  12/25/17  Palliative Care Type  New Palliative care  Reason for referral  Clarify Goals of Care  Date of Admission  12/23/17  Date first seen by Palliative Care  12/28/17  # of days Palliative referral response time  3 Day(s)  # of days IP prior to Palliative referral  2  Clinical Assessment  Psychosocial & Spiritual Assessment  Palliative Care Outcomes      Patient Active Problem List   Diagnosis Date Noted  . Severe sepsis (HCC) 12/23/2017  . AKI (acute kidney injury) (HCC) 12/23/2017  . UTI (urinary tract infection) 12/23/2017  . Acute cholecystitis 12/23/2017  . Cough   . Hypotension due to drugs   . Finger pain, right   . Poor nutrition   . Acute blood loss anemia   . 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 Acute ischemic right MCA stroke (HCC) 07/28/2017  . Diabetes mellitus type 2 in obese (HCC)   . Stage 3 chronic kidney disease (HCC)   . Morbid obesity (HCC)   . Cerebral infarction (HCC) 07/27/2017  . Hyperlipidemia   . Essential hypertension   . 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 Asymmetry of cerebral ventricles, Acute, Acquired  07/23/2017  . Encounter for screening colonoscopy 03/12/2015    Palliative Care Assessment & Plan     Recommendations/Plan:  Son who is POA states he needs to speak with his mother regarding care moving forward. Continue full scope at this time.    Code Status:    Code Status Orders  (From admission, onward)        Start     Ordered   12/23/17 2258  Full code  Continuous     12/23/17 2257    Code Status History    Date Active Date Inactive Code Status Order ID Comments User Context   08/13/2017 1711 08/25/2017 1443 Full Code [id number]  Gilmer Mor, DO Inpatient   07/28/2017 1832 08/13/2017 1711 Full Code [id number]  Jacquelynn Cree, PA-C Inpatient   07/28/2017 1832 07/28/2017 1832 Full Code [id number]  Jerene Pitch Inpatient   07/23/2017 1623 07/28/2017 1829 Full Code [id number]  Arlyce Harman, DO ED       Prognosis:  < 6 months Dementia, anemia requiring multiple blood transfusions,acute cholecystitis with drain placed. CVA 4 months ago.      Discharge Planning:  To Be Determined  Care plan was discussed with primary RN  Thank you for allowing the Palliative Medicine Team to assist in the care of this patient.   Total Time 35 min Prolonged Time Billed  no      Greater than 50%  of this time was spent counseling and coordinating care related to the above assessment and plan.  Morton Stall, NP  Please contact Palliative Medicine Team phone at [phone] for questions and concerns.

## 2017-12-31 NOTE — Progress Notes (Signed)
Nutrition Follow Up Note   DOCUMENTATION CODES:   Obesity unspecified  INTERVENTION:   Osmolite 1.5 @ goal rate of 43m/hr x 14 hrs (1800-0800)- Initiate at 470mhr and increase daily as tolerated.   Prostat liquid protein via tube 30 ml TID, each supplement provides 100 kcal, 15 grams protein.  Free water flushes 503m 4 hours   Regimen at goal will provide 1770kcal/day (meets 85% estimated needs), 106g/day protein (meets 108% estimated needs), 1047m68my free water.   NUTRITION DIAGNOSIS:   Inadequate oral intake related to (dementia, hx CVA 07/2017, dysphagia, acutely cholecystitis with abdominal pain) as evidenced by per patient/family report.  GOAL:   Patient will meet greater than or equal to 90% of their needs  -not met   MONITOR:   PO intake, Supplement acceptance, Diet advancement, Labs, Weight trends, I & O's, tube feeds   ASSESSMENT:   77 y37r old male with PMHx of Alzheimer's dementia, gout, HTN, CKD stage III, CVA 07/23/2017 s/p G-tube placement 08/13/2017 who presented with abdominal pain and fever found to have sepsis secondary to acute cholecystitis s/p CT guided placement of percutaneous cholecystostomy tube on 7/28.   Pt continues to have poor appetite and oral intake; eating 25% of meals and drinking some Ensure. Palliative care following; family wanting full aggressive care for now. Spoke to MD, will initiate tube feeds via G-tube. Will provide nocturnal feeds at 50% of pt's estimated needs to allow him to still eat during the day. RD will advance tube feeds daily as warranted. No new weight since 7/28; unsure if patient has had any weight loss. Suspect low refeeding risk as pt on dextrose and has been eating some; will check Mg and P in the morning. Biliary tube in place with 250ml40mput x 24 hrs.   Medications reviewed and include: aspirin, plavix, insulin, MVI, 5% Dextrose w/ KCl @50ml /hr, zosyn  Labs reviewed: K 4.1 wnl, BUN 25(H), creat 1.50(H) Wbc-  13.4(H), Hgb 7.0(L), Hct 20.9(L)  Diet Order:   Diet Order           DIET DYS 2 Room service appropriate? Yes with Assist; Fluid consistency: Thin  Diet effective now         EDUCATION NEEDS:   Not appropriate for education at this time  Skin:  Skin Assessment: Reviewed RN Assessment  Last BM:  12/26/2017 - smear  Height:   Ht Readings from Last 1 Encounters:  12/27/17 5' 11"  (1.803 m)    Weight:   Wt Readings from Last 1 Encounters:  12/27/17 250 lb (113.4 kg)    Ideal Body Weight:  78.2 kg  BMI:  Body mass index is 34.87 kg/m.  Estimated Nutritional Needs:   Kcal:  2075-2260 (MSJ x 1.1-1.2)  Protein:  100-115 grams (0.9-1 grams/kg)  Fluid:  2-2.2 L/day (1 mL/kcal)  CaseyKoleen DistanceRD, LDN Pager #- [phone]#- [phone] Hours Pager: [phone]

## 2017-12-31 NOTE — Progress Notes (Signed)
PT Cancellation Note  Patient Details Name: Noah RumpfJames Munley Jr. MRN: [id number] DOB: 1941-04-07   Cancelled Treatment:    Reason Eval/Treat Not Completed: Patient declined, no reason specified   Pt sleeping on arrival but awoke with light touch.  Offered and encouraged session but he refused stating he was cold.  Another blanket obtained but he continued to decline "I don't want to do any of that right now."  Will continue as appropriate.   Danielle DessSarah Aronda Burford 12/31/2017, 1:57 PM

## 2018-01-01 DIAGNOSIS — F0150 Vascular dementia without behavioral disturbance: Secondary | ICD-10-CM

## 2018-01-01 DIAGNOSIS — F0280 Dementia in other diseases classified elsewhere without behavioral disturbance: Secondary | ICD-10-CM

## 2018-01-01 DIAGNOSIS — K810 Acute cholecystitis: Secondary | ICD-10-CM

## 2018-01-01 DIAGNOSIS — G309 Alzheimer's disease, unspecified: Secondary | ICD-10-CM

## 2018-01-01 DIAGNOSIS — Z7189 Other specified counseling: Secondary | ICD-10-CM

## 2018-01-01 LAB — GLUCOSE, CAPILLARY
GLUCOSE-CAPILLARY: 158 mg/dL — AB (ref 70–99)
GLUCOSE-CAPILLARY: 145 mg/dL — AB (ref 70–99)
Glucose-Capillary: 109 mg/dL — ABNORMAL HIGH (ref 70–99)
Glucose-Capillary: 142 mg/dL — ABNORMAL HIGH (ref 70–99)

## 2018-01-01 LAB — PHOSPHORUS: Phosphorus: 3.9 mg/dL (ref 2.5–4.6)

## 2018-01-01 LAB — COMPREHENSIVE METABOLIC PANEL
ALK PHOS: 171 U/L — AB (ref 38–126)
ALT: 73 U/L — AB (ref 0–44)
ANION GAP: 5 (ref 5–15)
AST: 47 U/L — ABNORMAL HIGH (ref 15–41)
Albumin: 2.1 g/dL — ABNORMAL LOW (ref 3.5–5.0)
BUN: 23 mg/dL (ref 8–23)
CALCIUM: 8.2 mg/dL — AB (ref 8.9–10.3)
CO2: 26 mmol/L (ref 22–32)
CREATININE: 1.32 mg/dL — AB (ref 0.61–1.24)
Chloride: 107 mmol/L (ref 98–111)
GFR calc non Af Amer: 50 mL/min — ABNORMAL LOW (ref >60)
GFR, EST AFRICAN AMERICAN: 58 mL/min — AB (ref >60)
Glucose, Bld: 154 mg/dL — ABNORMAL HIGH (ref 70–99)
Potassium: 4.2 mmol/L (ref 3.5–5.1)
Sodium: 138 mmol/L (ref 135–145)
TOTAL PROTEIN: 5.5 g/dL — AB (ref 6.5–8.1)
Total Bilirubin: 0.5 mg/dL (ref 0.3–1.2)

## 2018-01-01 LAB — BPAM RBC
BLOOD PRODUCT EXPIRATION DATE: 201908262359
Blood Product Expiration Date: 201908262359
ISSUE DATE / TIME: 201908011430
ISSUE DATE / TIME: 201907311502
UNIT TYPE AND RH: 5100
Unit Type and Rh: 5100

## 2018-01-01 LAB — TYPE AND SCREEN
ABO/RH(D): O POS
Antibody Screen: NEGATIVE
UNIT DIVISION: 0
UNIT DIVISION: 0

## 2018-01-01 LAB — MAGNESIUM: Magnesium: 2 mg/dL (ref 1.7–2.4)

## 2018-01-01 MED ORDER — FREE WATER
150.0000 mL | Status: DC
  Administered 2018-01-01 – 2018-01-03 (×12): 150 mL

## 2018-01-01 MED ORDER — OSMOLITE 1.5 CAL PO LIQD
1000.0000 mL | ORAL | Status: DC
  Administered 2018-01-01 – 2018-01-02 (×2): 1000 mL

## 2018-01-01 NOTE — Progress Notes (Signed)
Patient ID: Noah Bush., male   DOB: 1940-07-14, 77 y.o.   MRN: [id number]     SURGICAL PROGRESS NOTE   Hospital Day(s): 9.   Post op day(s):  Marland Kitchen.   Interval History: Patient seen and examined, no acute events or new complaints overnight. Patient reports feeling "OK". Difficult to talk to patient due to baseline dementia.   Vital signs in last 24 hours: [min-max] current  Temp:  [98.1 F (36.7 C)-98.6 F (37 C)] 98.6 F (37 C) (08/02 0522) Pulse Rate:  [66-74] 73 (08/02 0519) Resp:  [17-18] 18 (08/02 0519) BP: (103-118)/(72-77) 104/72 (08/02 0519) SpO2:  [96 %-100 %] 100 % (08/02 0519)     Height: 5\' 11"  (180.3 cm) Weight: 113.4 kg (250 lb) BMI (Calculated): 34.88   Physical Exam:  Constitutional: alert, cooperative and no distress  Respiratory: breathing non-labored at rest  Cardiovascular: regular rate and sinus rhythm  Gastrointestinal: soft, mild-tender, and non-distended  Labs:  CBC Latest Ref Rng & Units 12/31/2017 12/31/2017 12/30/2017  WBC 3.8 - 10.6 K/uL 14.2(H) 13.4(H) 16.4(H)  Hemoglobin 13.0 - 18.0 g/dL 5.7(Q8.8(L) 7.0(L) 7.0(L)  Hematocrit 40.0 - 52.0 % 27.5(L) 20.9(L) 20.9(L)  Platelets 150 - 440 K/uL 411 367 378   CMP Latest Ref Rng & Units 01/01/2018 12/31/2017 12/30/2017  Glucose 70 - 99 mg/dL 469(G154(H) 295(M102(H) 98  BUN 8 - 23 mg/dL 23 84(X25(H) 32(G27(H)  Creatinine 0.61 - 1.24 mg/dL 4.01(U1.32(H) 2.72(Z1.50(H) 3.66(Y1.36(H)  Sodium 135 - 145 mmol/L 138 139 141  Potassium 3.5 - 5.1 mmol/L 4.2 4.1 3.6  Chloride 98 - 111 mmol/L 107 107 110  CO2 22 - 32 mmol/L 26 25 25   Calcium 8.9 - 10.3 mg/dL 8.2(L) 8.3(L) 8.3(L)  Total Protein 6.5 - 8.1 g/dL 4.0(H5.5(L) 4.7(Q5.5(L) 5.6(L)  Total Bilirubin 0.3 - 1.2 mg/dL 0.5 0.6 0.5  Alkaline Phos 38 - 126 U/L 171(H) 198(H) 258(H)  AST 15 - 41 U/L 47(H) 79(H) 168(H)  ALT 0 - 44 U/L 73(H) 96(H) 141(H)    Imaging studies: No new pertinent imaging studies   Assessment/Plan:  77 y.o.malewith acute cholecystitis, complicated by pertinent comorbidities includingsevere  dementia, alzheimer, stroke 4 months ago on plavix and aspirin, acute over chronic kidney injury. Patient without major improvement today of WBC but stable. Abdominal physical exam improved after drainage with mild tender on right upper quadrant. Tolerating diet. Agree with abx therapy. Continue current diet.   Gae GallopEdgardo Cintrn-Daz, MD

## 2018-01-01 NOTE — Consult Note (Signed)
Pharmacy Antibiotic Note  Noah RumpfJames Paris Jr. is a 77 y.o. male admitted on 12/23/2017 with sepsis secondary to acute cholesystitis. Pharmacy has been consulted for Zosyn dosing. He is s/p IR guided cholecystostomy tube placement postop day 5. His leukocytosis has improved but WBC remains slightly elevated and stable. He has been afebrile since the last note. His SCr has improved from the last note but remains slightly above baseline. This is day number 8 of IV Zosyn with a plan to complete 10 days of therapy.  Plan: Continue current regimen of IV Zosyn EI IV q8h  Height: 5\' 11"  (180.3 cm) Weight: 250 lb (113.4 kg) IBW/kg (Calculated) : 75.3  Temp (24hrs), Avg:98.3 F (36.8 C), Min:98.1 F (36.7 C), Max:98.6 F (37 C)  Recent Labs  Lab 12/28/17 0431 12/29/17 0448 12/30/17 0431 12/31/17 0527 12/31/17 2016 01/01/18 0430  WBC 15.8* 16.5* 16.4* 13.4* 14.2*  --   CREATININE 1.46* 1.43* 1.36* 1.50*  --  1.32*    Estimated Creatinine Clearance: 60 mL/min (A) (by C-G formula based on SCr of 1.32 mg/dL (H)).    No Known Allergies  Antimicrobials this admission: 7/24 Ceftriaxone >> x 1 dose  7/24 vancomycin >> 7/26 7/24 Meropenem >> 7/26 7/26 Zosyn >>   Microbiology results: 7/24 BCx: NGF 7/24 UCx: pan-S E coli 7/28 gall bladder fluid: NG pending final  Thank you for allowing pharmacy to be a part of this patient's care.  Lowella Bandyodney D Tamula Morrical, PharmD Clinical Pharmacist 01/01/2018 11:09 AM

## 2018-01-01 NOTE — Progress Notes (Signed)
Daily Progress Note   Patient Name: Noah Bush.       Date: 01/01/2018 DOB: 02/01/1941  Age: 77 y.o. MRN#: [id number] Attending Physician: Noah Leber, MD Primary Care Physician: Noah Rob, FNP Admit Date: 12/23/2017  Reason for Consultation/Follow-up: Establishing goals of care  Subjective: Patient sitting up in bed. Drinking Ensure independantly. Met with his son- Noah Bush to discuss [place].  Patient lives at home with his spouse. Noah Bush lives next door and checks on parents multiple times a day and provides care for them. He went to a nursing program to learn how to care for them. He is also looking into getting a life alert for them.  Prior to admission his father suffered a stroke in February but was making strong progress. Noah Bush was providing therapy at home, he had just begun taking steps with a walker. Noah Bush would call Noah Bush on the phone and tell him he was "ready to walk".  We discussed the overall trajectory of mixed vascular dementia, Alzheimer's dementia, and how this hospitalization can affect that trajectory- primarily increasing the speed of decline and worsening his function from the level that he was before admission.  Noah Bush states he understands that his Dad will not be at the level that he was prior to admission. However, he would like to take him home and continue to work with him. We discussed that his Dad expresses that he does not want to work with PT and declines to eat. Noah Bush feels that this is because he is in the hospital and sick and recovering from surgery. Noah Bush notes that this morning Noah Bush ate his entire tray of breakfast and last night Noah Bush was awake and making jokes with family.  Noah Bush states his Dad has a living will but he hasn't seen it. I  recommended that since he speaks for his father's wishes that he needs to see it and know what is in it. We discussed code status and long term ventilation. Noah Bush states "I wouldn't want him kept like a vegetable". He desires full code for now, but if his Dad was on a ventilator he would be open to further discussion.  Noah Bush states his GOC are to continue full aggressive care. He states that he will be present when PT arrives and encourage his Dad's participation.  [facility] Cho[facility]  book given for review. Family encouraged to call with any questions.  Review of Systems  Unable to perform ROS: Dementia    Length of Stay: 9  Current Medications: Scheduled Meds:  . aspirin EC  81 mg Oral Daily  . atorvastatin  80 mg Oral q1800  . clopidogrel  75 mg Oral Daily  . feeding supplement (ENSURE ENLIVE)  237 mL Oral TID BM  . feeding supplement (OSMOLITE 1.5 CAL)  1,000 mL Per Tube Q24H  . feeding supplement (PRO-STAT SUGAR FREE 64)  30 mL Per Tube TID  . free water  50 mL Per Tube Q4H  . insulin aspart  0-9 Units Subcutaneous Q6H  . latanoprost  1 drop Left Eye QHS  . memantine  5 mg Oral Daily  . multivitamin with minerals  1 tablet Oral Daily  . sodium chloride flush  10 mL Intracatheter Q12H    Continuous Infusions: . dextrose 5 % with KCl 20 mEq / L 20 mEq (01/01/18 0603)  . piperacillin-tazobactam (ZOSYN)  IV Stopped (01/01/18 0944)    PRN Meds: acetaminophen **OR** acetaminophen, ondansetron **OR** ondansetron (ZOFRAN) IV, oxyCODONE  Physical Exam  Constitutional: He appears well-developed and well-nourished.  Cardiovascular: Normal rate and regular rhythm.  Pulmonary/Chest: Effort normal.  Abdominal: Soft.  Neurological: He is alert.  Nursing note and vitals reviewed.           Vital Signs: BP 104/72 (BP Location: Right Arm)   Pulse 73   Temp 98.6 F (37 C)   Resp 18   Ht _0  (1.803 m)   Wt 113.4 kg (250 lb)   SpO2 100%   BMI 34.87 kg/m  SpO2: SpO2: 100 % O2 Device:  O2 Device: Nasal Cannula O2 Flow Rate: O2 Flow Rate (L/min): 3 L/min  Intake/output summary:   Intake/Output Summary (Last 24 hours) at 01/01/2018 1037 Last data filed at 01/01/2018 0944 Gross per 24 hour  Intake 1772.52 ml  Output 780 ml  Net 992.52 ml   LBM: Last BM Date: 12/30/17 Baseline Weight: Weight: 113.4 kg (250 lb) Most recent weight: Weight: 113.4 kg (250 lb)       Palliative Assessment/Data: PPS: 30%    Flowsheet Rows     Most Recent Value  Intake Tab  Referral Department  Hospitalist  Unit at Time of Referral  Med/Surg Unit  Date Notified  12/25/17  Palliative Care Type  New Palliative care  Reason for referral  Clarify Goals of Care  Date of Admission  12/23/17  Date first seen by Palliative Care  12/28/17  # of days Palliative referral response time  3 Day(s)  # of days IP prior to Palliative referral  2  Clinical Assessment  Psychosocial & Spiritual Assessment  Palliative Care Outcomes      Patient Active Problem List   Diagnosis Date Noted  . Severe sepsis ([facility]) 12/23/2017  . AKI (acute kidney injury) ([facility]) 12/23/2017  . UTI (urinary tract infection) 12/23/2017  . Acute cholecystitis 12/23/2017  . Cough   . Hypotension due to drugs   . Finger pain, right   . Poor nutrition   . Acute blood loss anemia   . 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 Acute ischemic right MCA stroke ([place]) 07/28/2017  . Diabetes mellitus type 2 in obese ([facility])   . Stage 3 chronic kidney disease ([facility])   . Morbid obesity ([place] [facility])   . Cerebral infarction ([facility]) 07/27/2017  . Hyperlipidemia   . Essential hypertension   .  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 Asymmetry of cerebral ventricles, Acute, Acquired  07/23/2017  . Encounter for screening colonoscopy 03/12/2015    Palliative Care Assessment & Plan   Patient Profile: JamesPoteatis S82 y.o.malewho presents with vomiting. Patient has history of prior stroke and dementia and is unable  to contribute much information to his history. He was found here in the ED on imaging to have acute cholecystitis, and meet sepsis criteria.  Assessment/Recommendations/Plan   D/C home with Palliative and Home health for PT/OT  Goals of Care and Additional Recommendations:  Limitations on Scope of Treatment: Full Scope Treatment  Code Status:  Full code  Prognosis:   Unable to determine  Discharge Planning:  Home with Palliative Services  Care plan was discussed with patient's son and Dr. Verdell Carmine.   Thank you for allowing the Palliative Medicine Team to assist in the care of this patient.   Time In: 0930 Time Out: 1100 Total Time 90 minutes Prolonged Time Billed Yes      Greater than 50%  of this time was spent counseling and coordinating care related to the above assessment and plan.  Mariana Kaufman, AGNP-C Palliative Medicine   Please contact Palliative Medicine Team phone at [phone] for questions and concerns.

## 2018-01-01 NOTE — Care Management Important Message (Signed)
Copy of signed IM left with patient in room.  

## 2018-01-01 NOTE — Progress Notes (Signed)
[facility] [place] NAME: Noah Bush    MR#:  [id number]  DATE OF BIRTH:  10/06/1940  SUBJECTIVE:   Patient is status post percutaneous cholecystostomy tube done by interventional radiology POD # 5.  His LFTs are much improved, electrolytes have also improved.  Patient tolerating p.o. okay.  White cell count has trended down, hemoglobin improved posttransfusion.  REVIEW OF SYSTEMS:    Review of Systems  Gastrointestinal: Positive for abdominal pain.    Nutrition: Dysphagia II with thin liquids Tolerating Diet: Yes but very little.   Tolerating PT: Bedbound  DRUG ALLERGIES:  No Known Allergies  VITALS:  Blood pressure 132/88, pulse 76, temperature 98.7 F (37.1 C), temperature source Axillary, resp. rate 18, height '5\' 11"'$  (1.803 m), weight 113.4 kg (250 lb), SpO2 100 %.  PHYSICAL EXAMINATION:   Physical Exam  GENERAL:  77 y.o.-year-old obese patient lying in bed confused but follows simple commands. EYES: Pupils equal, round, reactive to light. No scleral icterus. Extraocular muscles intact.  HEENT: Head atraumatic, normocephalic. Oropharynx and nasopharynx clear.  NECK:  Supple, no jugular venous distention. No thyroid enlargement, no tenderness.  LUNGS: Normal breath sounds bilaterally, no wheezing, rales, rhonchi. No use of accessory muscles of respiration.  CARDIOVASCULAR: S1, S2 normal. No murmurs, rubs, or gallops.  ABDOMEN: Soft, Tender in RUQ/epigastric area. Bowel sounds present. No organomegaly or mass. + PEG tube in place.  Positive right-sided cholecystostomy tube in place with bilious drainage noted. EXTREMITIES: No cyanosis, clubbing, +1-2 edema b/l.    NEUROLOGIC: Cranial nerves II through XII are intact. No focal Motor or sensory deficits b/l.  Globally weak.  PSYCHIATRIC: The patient is alert and oriented x 1.  SKIN: No obvious rash, lesion, or ulcer.    LABORATORY PANEL:   CBC Recent Labs  Lab 12/31/17 2016   WBC 14.2*  HGB 8.8*  HCT 27.5*  PLT 411   ------------------------------------------------------------------------------------------------------------------  Chemistries  Recent Labs  Lab 01/01/18 0430  NA 138  K 4.2  CL 107  CO2 26  GLUCOSE 154*  BUN 23  CREATININE 1.32*  CALCIUM 8.2*  MG 2.0  AST 47*  ALT 73*  ALKPHOS 171*  BILITOT 0.5   ------------------------------------------------------------------------------------------------------------------  Cardiac Enzymes No results for input(s): TROPONINI in the last 168 hours. ------------------------------------------------------------------------------------------------------------------  RADIOLOGY:  No results found.   ASSESSMENT AND PLAN:   77 year old male with past medical history of hypertension, gout, dementia, history of previous CVA status post PEG tube placement, chronic kidney disease stage III who presented to the hospital due to abdominal pain, fever noted to have sepsis secondary to acute cholecystitis.  1.  Sepsis- patient met criteria due to leukocytosis, CT scan suggestive of acute cholecystitis with abnormal LFTs and fever. -Status post IR cholecystostomy tube placement postop day # 5.  Afebrile, WBC count has trended down.  - cont. IV zosyn and will treat for a total of 10 days.    2.  Acute cholecystitis- source of patient's sepsis.   Status post IR guided cholecystostomy tube placement postop day #5. today.  Afebrile, white cell count has improved.  Continue empiric Zosyn, appreciate surgical input.   -Patient tolerating p.o. but not enough and therefore using PEG tube for supplemental feeding overnight. -- Follow LFT's which are much improved.  3.  Hypernatremia/hypokalemia- improved w/ D5W and stable now.   4.  Acute kidney injury-secondary to the sepsis and acute cholecystitis. - stable and improved w/ fluids.  5.  Urinary tract infection-based off a urinalysis.  Urine cultures positive  for E. coli which is pansensitive.   - on Zosyn and adequately treated.   6.  Anemia of chronic disease-patient's hemoglobin has drifted down significantly since admission.  Hemoglobin admission was over 12 now down to 7.0.  - Iron studies consistent with Anemia of Chronic disease  - transfused and Hg. Improved to 8 and will cont. To monitor.    7.  Hyperlipidemia-continue atorvastatin.  8.  Glaucoma-continue latanoprost eyedrops.  9.  hx of previous CVA- cont. ASA, Plavix, Statin.    10. Dementia - cont. namenda  Patient's prognosis still guarded to poor given his multiple comorbidities and recurrent recent hospitalizations.  Palliative care was consulted and had a family meeting with the patient's son and wife today.  Patient remains a full code for now and they do want the patient to come back home with home health services once stable.  Likely to discharge over the next 1 to 2 days.  All the records are reviewed and case discussed with Care Management/Social Worker. Management plans discussed with the patient, family and they are in agreement.  CODE STATUS: Full code  DVT Prophylaxis: Ted's & SCD's.   TOTAL TIME TAKING CARE OF THIS PATIENT: 30 minutes.   POSSIBLE D/C IN 2-3 DAYS, DEPENDING ON CLINICAL CONDITION.   Henreitta Leber M.D on 01/01/2018 at 3:18 PM  Between 7am to 6pm - Pager - [phone]  After 6pm go to [email or web address] - Proofreader  [facility]  Office  [phone]  CC: Primary care physician; Novella Rob, FNP

## 2018-01-01 NOTE — Progress Notes (Signed)
Physical Therapy Treatment Patient Details Name: Noah RumpfJames Borboa Jr. MRN: [id number] DOB: May 08, 1941 Today's Date: 01/01/2018    History of Present Illness 77 year old male with past medical history of hypertension, gout, DM, dementia, history of previous CVA leading to L sided weakness, chronic kidney disease stage III who presented to the hospital due to abdominal pain, fever noted to have sepsis secondary to acute cholecystitis. Patient is status post percutaneous cholecystostomy tube.    PT Comments    Patient alert at start of session, agreeable to some bed level exercises. Able to participate in RLE exercises well, more limited AAROM exercises for LLE. Attempted to perform supine to sit, total x2, patient actively trying to lay back in bed. Patient returned to supine and repositioned, all needs in reach in NAD. The patient would benefit from further skilled PT to continue to progress towards goals.    Follow Up Recommendations  SNF     Equipment Recommendations       Recommendations for Other Services       Precautions / Restrictions Precautions Precautions: Fall Restrictions Weight Bearing Restrictions: No Other Position/Activity Restrictions: Pt has cholecystostomy tube     Mobility  Bed Mobility Overal bed mobility: Needs Assistance Bed Mobility: Supine to Sit     Supine to sit: Total assist;+2 for physical assistance     General bed mobility comments: Patient unable/unwilling to maintain seated position  Transfers                    Ambulation/Gait                 Stairs             Wheelchair Mobility    Modified Rankin (Stroke Patients Only)       Balance Overall balance assessment: Needs assistance Sitting-balance support: Bilateral upper extremity supported;Feet supported Sitting balance-Leahy Scale: Zero Sitting balance - Comments: No righting reactions, actively trying to lay back                                     Cognition Arousal/Alertness: Awake/alert Behavior During Therapy: WFL for tasks assessed/performed Overall Cognitive Status: History of cognitive impairments - at baseline                                        Exercises General Exercises - Lower Extremity Ankle Circles/Pumps: AAROM;10 reps Heel Slides: AAROM;Right;10 reps Hip ABduction/ADduction: AAROM;Both;10 reps Straight Leg Raises: AAROM;Both;10 reps    General Comments        Pertinent Vitals/Pain Pain Assessment: No/denies pain    Home Living                      Prior Function            PT Goals (current goals can now be found in the care plan section) Acute Rehab PT Goals PT Goal Formulation: Patient unable to participate in goal setting Progress towards PT goals: Progressing toward goals    Frequency    Min 2X/week      PT Plan Current plan remains appropriate    Co-evaluation              AM-PAC PT "6 Clicks" Daily Activity  Outcome Measure  Difficulty turning over in bed (including adjusting bedclothes,  sheets and blankets)?: Unable Difficulty moving from lying on back to sitting on the side of the bed? : Unable Difficulty sitting down on and standing up from a chair with arms (e.g., wheelchair, bedside commode, etc,.)?: Unable Help needed moving to and from a bed to chair (including a wheelchair)?: Total Help needed walking in hospital room?: Total Help needed climbing 3-5 steps with a railing? : Total 6 Click Score: 6    End of Session Equipment Utilized During Treatment: Oxygen Activity Tolerance: Patient tolerated treatment well Patient left: in bed;with bed alarm set;with call bell/phone within reach Nurse Communication: Mobility status PT Visit Diagnosis: Muscle weakness (generalized) (M62.81);Unsteadiness on feet (R26.81);Difficulty in walking, not elsewhere classified (R26.2);Hemiplegia and hemiparesis Hemiplegia - Right/Left: Left Hemiplegia -  dominant/non-dominant: Non-dominant     Time: 1544-1600 PT Time Calculation (min) (ACUTE ONLY): 16 min  Charges:  $Therapeutic Exercise: 8-22 mins                     Olga Coaster PT, DPT [phone] PM,01/01/18 [phone]

## 2018-01-01 NOTE — Progress Notes (Signed)
Dr Cherlynn KaiserSainani said to discontinue IVF

## 2018-01-02 LAB — COMPREHENSIVE METABOLIC PANEL
ALK PHOS: 144 U/L — AB (ref 38–126)
ALT: 50 U/L — AB (ref 0–44)
AST: 30 U/L (ref 15–41)
Albumin: 2 g/dL — ABNORMAL LOW (ref 3.5–5.0)
Anion gap: 4 — ABNORMAL LOW (ref 5–15)
BUN: 23 mg/dL (ref 8–23)
CALCIUM: 8.1 mg/dL — AB (ref 8.9–10.3)
CO2: 27 mmol/L (ref 22–32)
CREATININE: 1.19 mg/dL (ref 0.61–1.24)
Chloride: 107 mmol/L (ref 98–111)
GFR calc Af Amer: >60 mL/min (ref >60)
GFR calc non Af Amer: 57 mL/min — ABNORMAL LOW (ref >60)
GLUCOSE: 126 mg/dL — AB (ref 70–99)
Potassium: 4.3 mmol/L (ref 3.5–5.1)
SODIUM: 138 mmol/L (ref 135–145)
Total Bilirubin: 0.4 mg/dL (ref 0.3–1.2)
Total Protein: 5.4 g/dL — ABNORMAL LOW (ref 6.5–8.1)

## 2018-01-02 LAB — AEROBIC/ANAEROBIC CULTURE (SURGICAL/DEEP WOUND): CULTURE: NO GROWTH

## 2018-01-02 LAB — GLUCOSE, CAPILLARY
GLUCOSE-CAPILLARY: 124 mg/dL — AB (ref 70–99)
Glucose-Capillary: 123 mg/dL — ABNORMAL HIGH (ref 70–99)
Glucose-Capillary: 131 mg/dL — ABNORMAL HIGH (ref 70–99)
Glucose-Capillary: 134 mg/dL — ABNORMAL HIGH (ref 70–99)
Glucose-Capillary: 82 mg/dL (ref 70–99)

## 2018-01-02 LAB — AEROBIC/ANAEROBIC CULTURE W GRAM STAIN (SURGICAL/DEEP WOUND)
Gram Stain: NONE SEEN
Special Requests: NORMAL

## 2018-01-02 NOTE — Progress Notes (Signed)
Patient ID: Noah RumpfJames Brinkmeier Jr., male   DOB: 06-01-1941, 77 y.o.   MRN: [id number]     SURGICAL PROGRESS NOTE   Hospital Day(s): 10.   Post op day(s):  Marland Kitchen.   Interval History: Patient seen and examined, no acute events or new complaints overnight. Patient reports unable to take history from patient due to dementia, but he does mention mild discomfort upon palpation of epigastric area. Patient alert, in no distress.  Vital signs in last 24 hours: [min-max] current  Temp:  [97.4 F (36.3 C)-98.7 F (37.1 C)] 97.4 F (36.3 C) (08/03 0609) Pulse Rate:  [65-76] 69 (08/03 0609) Resp:  [18-20] 20 (08/03 0609) BP: (120-132)/(64-99) 127/75 (08/03 0609) SpO2:  [98 %-100 %] 98 % (08/03 0609)     Height: 5\' 11"  (180.3 cm) Weight: 113.4 kg (250 lb) BMI (Calculated): 34.88    Physical Exam:  Constitutional: alert, cooperative and no distress  Gastrointestinal: soft, mild-tender, and non-distended. Drainage in place with bile content.   Labs:  CBC Latest Ref Rng & Units 12/31/2017 12/31/2017 12/30/2017  WBC 3.8 - 10.6 K/uL 14.2(H) 13.4(H) 16.4(H)  Hemoglobin 13.0 - 18.0 g/dL 4.0(J8.8(L) 7.0(L) 7.0(L)  Hematocrit 40.0 - 52.0 % 27.5(L) 20.9(L) 20.9(L)  Platelets 150 - 440 K/uL 411 367 378   CMP Latest Ref Rng & Units 01/02/2018 01/01/2018 12/31/2017  Glucose 70 - 99 mg/dL 811(B126(H) 147(W154(H) 295(A102(H)  BUN 8 - 23 mg/dL 23 23 21(H25(H)  Creatinine 0.61 - 1.24 mg/dL 0.861.19 5.78(I1.32(H) 6.96(E1.50(H)  Sodium 135 - 145 mmol/L 138 138 139  Potassium 3.5 - 5.1 mmol/L 4.3 4.2 4.1  Chloride 98 - 111 mmol/L 107 107 107  CO2 22 - 32 mmol/L 27 26 25   Calcium 8.9 - 10.3 mg/dL 8.1(L) 8.2(L) 8.3(L)  Total Protein 6.5 - 8.1 g/dL 9.5(M5.4(L) 8.4(X5.5(L) 3.2(G5.5(L)  Total Bilirubin 0.3 - 1.2 mg/dL 0.4 0.5 0.6  Alkaline Phos 38 - 126 U/L 144(H) 171(H) 198(H)  AST 15 - 41 U/L 30 47(H) 79(H)  ALT 0 - 44 U/L 50(H) 73(H) 96(H)   Imaging studies: No new pertinent imaging studies  Assessment/Plan:  77 y.o.malewith acute cholecystitis, complicated by pertinent  comorbidities includingsevere dementia, alzheimer, stroke 4 months ago on plavix and aspirin, acute over chronic kidney injury. No new CBC today to see WBC trend. Minimal pain on physical exam that is much better compare to before drainage. Tolerating small feeding. Adequate improvement of bilirubin and liver enzymes. Agree to continue current management.   Gae GallopEdgardo Cintrn-Daz, MD

## 2018-01-02 NOTE — Progress Notes (Signed)
Pt refused to eat lunch. States he "has to get up and get to an appointment". Pt is unable to stand at this time. MD text paged through Amion per his request on pt's lunch intake.

## 2018-01-02 NOTE — Progress Notes (Signed)
[facility] at [place] NAME: Noah Bush    MR#:  [id number]  DATE OF BIRTH:  05-20-1941  SUBJECTIVE:   Patient is status post percutaneous cholecystostomy tube done by interventional radiology POD # 6.  His LFTs are much improved, electrolytes have also improved.  Patient tolerating p.o. okay.  White cell count has trended down, hemoglobin improved posttransfusion and will cont. To monitor.  REVIEW OF SYSTEMS:    Review of Systems  Gastrointestinal: Positive for abdominal pain.    Nutrition: Dysphagia II with thin liquids Tolerating Diet: Yes but little.   Tolerating PT: Bedbound  DRUG ALLERGIES:  No Known Allergies  VITALS:  Blood pressure (!) 164/139, pulse 67, temperature 97.9 F (36.6 C), temperature source Oral, resp. rate 17, height 5' 11"  (1.803 m), weight 113.4 kg (250 lb), SpO2 (!) 81 %.  PHYSICAL EXAMINATION:   Physical Exam  GENERAL:  77 y.o.-year-old obese patient lying in bed confused but follows simple commands. EYES: Pupils equal, round, reactive to light. No scleral icterus. Extraocular muscles intact.  HEENT: Head atraumatic, normocephalic. Oropharynx and nasopharynx clear.  NECK:  Supple, no jugular venous distention. No thyroid enlargement, no tenderness.  LUNGS: Normal breath sounds bilaterally, no wheezing, rales, rhonchi. No use of accessory muscles of respiration.  CARDIOVASCULAR: S1, S2 normal. No murmurs, rubs, or gallops.  ABDOMEN: Soft, Tender in RUQ/epigastric area. Bowel sounds present. No organomegaly or mass. + PEG tube in place.  Positive right-sided cholecystostomy tube in place with bilious drainage noted. EXTREMITIES: No cyanosis, clubbing, +1-2 edema b/l.    NEUROLOGIC: Cranial nerves II through XII are intact. No focal Motor or sensory deficits b/l.  Globally weak.  PSYCHIATRIC: The patient is alert and oriented x 1.  SKIN: No obvious rash, lesion, or ulcer.    LABORATORY PANEL:   CBC Recent  Labs  Lab 12/31/17 2016  WBC 14.2*  HGB 8.8*  HCT 27.5*  PLT 411   ------------------------------------------------------------------------------------------------------------------  Chemistries  Recent Labs  Lab 01/01/18 0430 01/02/18 0538  NA 138 138  K 4.2 4.3  CL 107 107  CO2 26 27  GLUCOSE 154* 126*  BUN 23 23  CREATININE 1.32* 1.19  CALCIUM 8.2* 8.1*  MG 2.0  --   AST 47* 30  ALT 73* 50*  ALKPHOS 171* 144*  BILITOT 0.5 0.4   ------------------------------------------------------------------------------------------------------------------  Cardiac Enzymes No results for input(s): TROPONINI in the last 168 hours. ------------------------------------------------------------------------------------------------------------------  RADIOLOGY:  No results found.   ASSESSMENT AND PLAN:   77 year old male with past medical history of hypertension, gout, dementia, history of previous CVA status post PEG tube placement, chronic kidney disease stage III who presented to the hospital due to abdominal pain, fever noted to have sepsis secondary to acute cholecystitis.  1.  Sepsis- patient met criteria due to leukocytosis, CT scan suggestive of acute cholecystitis with abnormal LFTs and fever. -Status post IR cholecystostomy tube placement postop day # 6.  Afebrile, WBC count has trended down.  - cont. IV zosyn and will treat for a total of 10 days.    2.  Acute cholecystitis- source of patient's sepsis.   Status post IR guided cholecystostomy tube placement postop day #6. today.  Afebrile, white cell count has improved.  Continue empiric Zosyn, appreciate surgical input.   -Discussed with interventional radiology and also general surgery and patient will need to have the cholecystostomy tube in for 4 to 6 weeks.  He will need outpatient cholangiogram prior to  getting it removed.  General surgery to arrange this as an outpatient. -Follow LFTs which have significantly improved.   Abdominal pain has improved, patient is tolerating p.o. okay.  Continue nocturnal tube feedings for supplementation.  3.  Hypernatremia/hypokalemia- much improved and resolved with supplementation.  4.  Acute kidney injury-secondary to the sepsis and acute cholecystitis. - stable and improved w/ fluids.    5.  Urinary tract infection-based off a urinalysis.  Urine cultures positive for E. coli which is pansensitive.   - on Zosyn and adequately treated.   6.  Anemia of chronic disease-patient's hemoglobin has drifted down significantly since admission.   - Iron studies consistent with Anemia of Chronic disease  - transfused and Hg. Improved to 8 and will cont. To monitor.    7.  Hyperlipidemia-continue atorvastatin.  8.  Glaucoma-continue latanoprost eyedrops.  9.  hx of previous CVA- cont. ASA, Plavix, Statin.    10. Dementia - cont. namenda  Patient's prognosis still guarded to poor given his multiple comorbidities and recurrent recent hospitalizations.  Palliative care was consulted and they had a family meeting with the patient's son and wife yesterday.  Patient remains a full code for now and they do want the patient to come back home with home health services once stable.  Likely discharge home tomorrow.   All the records are reviewed and case discussed with Care Management/Social Worker. Management plans discussed with the patient, family and they are in agreement.  CODE STATUS: Full code  DVT Prophylaxis: Ted's & SCD's.   TOTAL TIME TAKING CARE OF THIS PATIENT: 30 minutes.   POSSIBLE D/C IN 1-2 DAYS, DEPENDING ON CLINICAL CONDITION.   Henreitta Leber M.D on 01/02/2018 at 12:30 PM  Between 7am to 6pm - Pager - [phone]  After 6pm go to [email or web address] - Proofreader  [facility]  Office  [phone]  CC: Primary care physician; Novella Rob, FNP

## 2018-01-03 LAB — COMPREHENSIVE METABOLIC PANEL
ALBUMIN: 2 g/dL — AB (ref 3.5–5.0)
ALT: 38 U/L (ref 0–44)
AST: 24 U/L (ref 15–41)
Alkaline Phosphatase: 120 U/L (ref 38–126)
Anion gap: 4 — ABNORMAL LOW (ref 5–15)
BUN: 25 mg/dL — AB (ref 8–23)
CHLORIDE: 108 mmol/L (ref 98–111)
CO2: 28 mmol/L (ref 22–32)
CREATININE: 1.11 mg/dL (ref 0.61–1.24)
Calcium: 8.1 mg/dL — ABNORMAL LOW (ref 8.9–10.3)
GFR calc Af Amer: >60 mL/min (ref >60)
GFR calc non Af Amer: >60 mL/min (ref >60)
Glucose, Bld: 124 mg/dL — ABNORMAL HIGH (ref 70–99)
POTASSIUM: 4.5 mmol/L (ref 3.5–5.1)
Sodium: 140 mmol/L (ref 135–145)
Total Bilirubin: 0.4 mg/dL (ref 0.3–1.2)
Total Protein: 5.3 g/dL — ABNORMAL LOW (ref 6.5–8.1)

## 2018-01-03 LAB — CBC
HEMATOCRIT: 25.7 % — AB (ref 40.0–52.0)
Hemoglobin: 8.5 g/dL — ABNORMAL LOW (ref 13.0–18.0)
MCH: 28 pg (ref 26.0–34.0)
MCHC: 33.2 g/dL (ref 32.0–36.0)
MCV: 84.3 fL (ref 80.0–100.0)
PLATELETS: 469 10*3/uL — AB (ref 150–440)
RBC: 3.05 MIL/uL — ABNORMAL LOW (ref 4.40–5.90)
RDW: 17.4 % — AB (ref 11.5–14.5)
WBC: 11.5 10*3/uL — ABNORMAL HIGH (ref 3.8–10.6)

## 2018-01-03 LAB — GLUCOSE, CAPILLARY
Glucose-Capillary: 115 mg/dL — ABNORMAL HIGH (ref 70–99)
Glucose-Capillary: 129 mg/dL — ABNORMAL HIGH (ref 70–99)

## 2018-01-03 MED ORDER — OSMOLITE 1.5 CAL PO LIQD: 1000.0000 mL | ORAL | 0 refills | Status: AC

## 2018-01-03 MED ORDER — FREE WATER: 150.0000 mL | Freq: Four times a day (QID) | Status: AC

## 2018-01-03 MED ORDER — AMOXICILLIN-POT CLAVULANATE 250-62.5 MG/5ML PO SUSR: 500.0000 mg | Freq: Three times a day (TID) | ORAL | 0 refills | Status: AC

## 2018-01-03 NOTE — Care Management Note (Signed)
Case Management Note  Patient Details  Name: Noah RumpfJames Speaker Jr. MRN: [id number] Date of Birth: 07-Oct-1940  Subjective/Objective:  Patient to be discharged per MD order. Orders in place for home health services. FAmily prefers to take patient home with the understanding he needs a very high level of care. Family prefers [facility] since they have used them in the past. Referral placed with Noah Bush for PT, RN, aide, Social work, OT and speech therapy. Family has all necessary DME including, hospital bed, hoyer lift, wheelchair, bsc and walker. EMS for transportation. Palliative consult in place, will fax information for referral to [facility]. Medical team has communicated with Noah Bush who is in agreement with plan.    Noah DutyJosh Sylas Twombly RN BSN RNCM [phone]             Action/Plan:   Expected Discharge Date:  01/03/18               Expected Discharge Plan:  Home w Home Health Services  In-House Referral:     Discharge planning Services  CM Consult  Post Acute Care Choice:  Home Health Choice offered to:  Adult Children  DME Arranged:    DME Agency:     HH Arranged:  RN, PT, OT, Nurse's Aide, Speech Therapy, Social Work [facility] [employer]  [facility]  Status of Service:  Completed, signed off  If discussed at [employer] Meetings, dates discussed:    Additional Comments:  Noah ManifoldJosh A Seymore Brodowski, RN 01/03/2018, 11:36 AM

## 2018-01-03 NOTE — Progress Notes (Signed)
Attempted to feed pt. Pt refused to eat breakfast. Lynna NT stated she attempted to feed pt as well. RN asked if pt was hungry. He stated he was not.

## 2018-01-03 NOTE — Progress Notes (Addendum)
  EMS on floor to take pt home. Called [name] - pt's son to notify of pt's discharge home. [name] confirmed that he was ready. Denies questions . EMS departed floor with pt and discharge paperwork. EMT verbalizes understanding to give discharge packet/script to pt's son [name].

## 2018-01-03 NOTE — Discharge Summary (Signed)
[facility] NAME: Noah Bush    MR#:  [id number]  DATE OF BIRTH:  November 03, 1940  DATE OF ADMISSION:  12/23/2017 ADMITTING PHYSICIAN: Lance Coon, MD  DATE OF DISCHARGE: 01/03/2018  PRIMARY CARE PHYSICIAN: Novella Rob, FNP    ADMISSION DIAGNOSIS:  Acute cholecystitis [K81.0] Sepsis, due to unspecified organism ([place]) [A41.9]  DISCHARGE DIAGNOSIS:  Principal Problem:   Severe sepsis ([place]) Active Problems:   Hyperlipidemia   Essential hypertension   Mixed Alzheimer's and vascular dementia   Diabetes mellitus type 2 in obese ([place])   AKI (acute kidney injury) ([place])   UTI (urinary tract infection)   Acute cholecystitis   Advanced care planning/counseling discussion   Goals of care, counseling/discussion   SECONDARY DIAGNOSIS:   Past Medical History:  Diagnosis Date  . Alzheimer's dementia    Archie Endo 07/23/2017  . Cerebral infarction ([place]) 07/27/2017  . Cerebrovascular accident (CVA) ([place]) 07/23/2017  . CKD (chronic kidney disease), stage III ([place])    Archie Endo 07/23/2017  . Gout   . Hypertension     HOSPITAL COURSE:   77 year old male with past medical history of hypertension, gout, dementia, history of previous CVA status post PEG tube placement, chronic kidney disease stage III who presented to the hospital due to abdominal pain, fever noted to have sepsis secondary to acute cholecystitis.  1.  Sepsis- patient met criteria due to leukocytosis, CT scan suggestive of acute cholecystitis with abnormal LFTs and fever. -Initially patient was treated with broad-spectrum IV antibiotics but was not deemed a good surgical candidate.  Interventional radiology was consulted and patient underwent a cholecystostomy tube.  Patient is postop day #7 today. - His blood cultures remain negative, his urine cultures grew out E. coli and it was pansensitive and patient has been adequately treated with antibiotics. -She is currently afebrile,  hemodynamically stable his leukocytosis has significantly improved.  He is being discharged home with home health services.  He will finish few more days of oral Augmentin for his underlying cholecystitis.  2.  Acute cholecystitis- source of patient's sepsis.   -Patient was seen by surgery and not deemed a good surgical candidate given his comorbidities and multiple health problems.  Patient is status post IR guided cholecystostomy tube placement postop day #7 today.  Patient's LFTs have significantly improved.  His white cell count has normalized, he has no worsening abdominal pain nausea or vomiting. - He is being discharged on oral Augmentin for a few more days and has finished a total of 8 days of IV Zosyn while in the hospital. -He will follow-up with general surgery with Dr. Peyton Najjar as an outpatient.  3.  Hypernatremia/hypokalemia-  this was secondary to dehydration and poor p.o. intake.  Patient was given D5W and electrolytes are placed on these have normalized now.  4.  Acute kidney injury-secondary to the sepsis and acute cholecystitis. - stable and resolved with IV fluids and Cr. Is back to baseline.   5.  Urinary tract infection-based off a urinalysis.  Urine cultures were positive for E. coli which was pansensitive.   - pt. Was on IV zosyn while in the hospital and has been adequately treated for the UTI.   6.  Anemia of chronic disease-patient's hemoglobin has drifted down significantly since admission.   -Patient's iron studies were consistent with anemia of chronic disease.  Patient was transfused a total of 2 units of packed red blood cells while in the hospital.  Hemoglobin is  improved posttransfusion is currently stable.  He has had no stigmata of acute bleeding.  7.  Hyperlipidemia- pt. Will continue atorvastatin.  8.  Glaucoma- pt. Will continue latanoprost eyedrops.   9.  hx of previous CVA- pt. Will cont. ASA, Plavix, Statin.    10. Dementia - pt. Will cont.  namenda  Patient is total care and bedbound and therefore a palliative care consult was obtained to discuss goals of care with the family.  Patient's family wanted to continue and pursue aggressive care and he remains a full code.  Physical therapy recommended short-term rehab.  Patient family did not want him to go to rehab would prefer for him to go home with home health services and they have full support at home.  Patient was discharged home with home health services and this was discussed in detail with the patient's son and wife.  DISCHARGE CONDITIONS:   Stable.    CONSULTS OBTAINED:  Treatment Team:  Herbert Pun, MD Benjamine Sprague, DO  DRUG ALLERGIES:  No Known Allergies  DISCHARGE MEDICATIONS:   Allergies as of 01/03/2018   No Known Allergies     Medication List    STOP taking these medications   polyethylene glycol packet Commonly known as:  MIRALAX / GLYCOLAX   traZODone 50 MG tablet Commonly known as:  DESYREL     TAKE these medications   acetaminophen 325 MG tablet Commonly known as:  TYLENOL Take 1-2 tablets (325-650 mg total) by mouth every 4 (four) hours as needed for mild pain.   albuterol (2.5 MG/3ML) 0.083% nebulizer solution Commonly known as:  PROVENTIL Take 3 mLs (2.5 mg total) by nebulization every 6 (six) hours as needed for wheezing or shortness of breath.   amoxicillin-clavulanate 250-62.5 MG/5ML suspension Commonly known as:  AUGMENTIN Take 10 mLs (500 mg total) by mouth 3 (three) times daily for 4 days.   aspirin EC 81 MG tablet Take 81 mg by mouth daily.   atorvastatin 80 MG tablet Commonly known as:  LIPITOR Take 1 tablet (80 mg total) by mouth daily at 6 PM. What changed:  how much to take   clopidogrel 75 MG tablet Commonly known as:  PLAVIX Take 1 tablet (75 mg total) by mouth daily.   feeding supplement (OSMOLITE 1.5 CAL) Liqd Place 1,000 mLs into feeding tube daily. What changed:    how much to take  when to take  this   feeding supplement (PRO-STAT SUGAR FREE 64) Liqd Place 30 mLs into feeding tube 3 (three) times daily between meals.   free water Soln Place 150 mLs into feeding tube 4 (four) times daily. What changed:    how much to take  Another medication with the same name was removed. Continue taking this medication, and follow the directions you see here.   latanoprost 0.005 % ophthalmic solution Commonly known as:  XALATAN Place 1 drop into the left eye at bedtime.   memantine 5 MG tablet Commonly known as:  NAMENDA Take 5 mg by mouth daily.   methylphenidate 5 MG tablet Commonly known as:  RITALIN Take 1 tablet (5 mg total) by mouth 2 (two) times daily with breakfast and lunch.   metoprolol succinate 25 MG 24 hr tablet Commonly known as:  TOPROL-XL Take 0.5 tablets (12.5 mg total) by mouth daily.   multivitamin Tabs tablet Take 1 tablet by mouth at bedtime.   MUSCLE RUB 10-15 % Crea Apply 1 application topically 4 (four) times daily. To bilateral hands and  bilateral knees         DISCHARGE INSTRUCTIONS:   DIET:  Cardiac diet  Dysphagia 2 with thin liquids with tube feed supplementation overnight.  DISCHARGE CONDITION:  Fair  ACTIVITY:  Activity as tolerated  OXYGEN:  Home Oxygen: No.   Oxygen Delivery: room air  DISCHARGE LOCATION:  Home with home health nursing, physical therapy, aide, social work and speech  If you experience worsening of your admission symptoms, develop shortness of breath, life threatening emergency, suicidal or homicidal thoughts you must seek medical attention immediately by calling 911 or calling your MD immediately  if symptoms less severe.  You Must read complete instructions/literature along with all the possible adverse reactions/side effects for all the Medicines you take and that have been prescribed to you. Take any new Medicines after you have completely understood and accpet all the possible adverse reactions/side effects.    Please note  You were cared for by a hospitalist during your hospital stay. If you have any questions about your discharge medications or the care you received while you were in the hospital after you are discharged, you can call the unit and asked to speak with the hospitalist on call if the hospitalist that took care of you is not available. Once you are discharged, your primary care physician will handle any further medical issues. Please note that NO REFILLS for any discharge medications will be authorized once you are discharged, as it is imperative that you return to your primary care physician (or establish a relationship with a primary care physician if you do not have one) for your aftercare needs so that they can reassess your need for medications and monitor your lab values.     Today   No acute events overnight.  Patient arousable and follows simple commands denies any abdominal pain.  LFTs are stable.  White cell count much improved.  Renal function back to baseline.  Will discharge home with home health services.  Discussed this with the patient's son and wife over the phone.  VITAL SIGNS:  Blood pressure 116/78, pulse 71, temperature 98.4 F (36.9 C), temperature source Oral, resp. rate 20, height 5' 11"  (1.803 m), weight 113.4 kg (250 lb), SpO2 100 %.  I/O:    Intake/Output Summary (Last 24 hours) at 01/03/2018 1324 Last data filed at 01/03/2018 0800 Gross per 24 hour  Intake 1153 ml  Output 75 ml  Net 1078 ml    PHYSICAL EXAMINATION:   GENERAL:  77 y.o.-year-old obese patient lying in bed in NAD.  EYES: Pupils equal, round, reactive to light. No scleral icterus. Extraocular muscles intact.  HEENT: Head atraumatic, normocephalic. Oropharynx and nasopharynx clear.  NECK:  Supple, no jugular venous distention. No thyroid enlargement, no tenderness.  LUNGS: Normal breath sounds bilaterally, no wheezing, rales, rhonchi. No use of accessory muscles of respiration.   CARDIOVASCULAR: S1, S2 normal. No murmurs, rubs, or gallops.  ABDOMEN: Soft, Tender in RUQ/epigastric area. Bowel sounds present. No organomegaly or mass. + PEG tube in place.  Positive right-sided cholecystostomy tube in place with bilious drainage noted. EXTREMITIES: No cyanosis, clubbing, +1-2 edema b/l.    NEUROLOGIC: Cranial nerves II through XII are intact. No focal Motor or sensory deficits b/l.  Globally weak.  PSYCHIATRIC: The patient is alert and oriented x 2.  SKIN: No obvious rash, lesion, or ulcer.   DATA REVIEW:   CBC Recent Labs  Lab 01/03/18 0607  WBC 11.5*  HGB 8.5*  HCT 25.7*  PLT  [place]  Lab 01/01/18 0430  01/03/18 0607  NA 138   < > 140  K 4.2   < > 4.5  CL 107   < > 108  CO2 26   < > 28  GLUCOSE 154*   < > 124*  BUN 23   < > 25*  CREATININE 1.32*   < > 1.11  CALCIUM 8.2*   < > 8.1*  MG 2.0  --   --   AST 47*   < > 24  ALT 73*   < > 38  ALKPHOS 171*   < > 120  BILITOT 0.5   < > 0.4   < > = values in this interval not displayed.    Cardiac Enzymes No results for input(s): TROPONINI in the last 168 hours.  Microbiology Results  Results for orders placed or performed during the hospital encounter of 12/23/17  Urine culture     Status: Abnormal   Collection Time: 12/23/17  7:25 PM  Result Value Ref Range Status   Specimen Description   Final    URINE, RANDOM Performed at [facility], [address], [place], [place] [address]    Special Requests   Final    NONE Performed at [facility], [address] [place], [place] [address]    Culture >=100,000 COLONIES/mL ESCHERICHIA COLI (A)  Final   Report Status 12/26/2017 FINAL  Final   Organism ID, Bacteria ESCHERICHIA COLI (A)  Final      Susceptibility   Escherichia coli - MIC*    AMPICILLIN 4 SENSITIVE Sensitive     CEFAZOLIN <=4 SENSITIVE Sensitive     CEFTRIAXONE <=1 SENSITIVE Sensitive     CIPROFLOXACIN <=0.25 SENSITIVE Sensitive      GENTAMICIN <=1 SENSITIVE Sensitive     IMIPENEM <=0.25 SENSITIVE Sensitive     NITROFURANTOIN <=16 SENSITIVE Sensitive     TRIMETH/SULFA <=20 SENSITIVE Sensitive     AMPICILLIN/SULBACTAM <=2 SENSITIVE Sensitive     PIP/TAZO <=4 SENSITIVE Sensitive     Extended ESBL NEGATIVE Sensitive     * >=100,000 COLONIES/mL ESCHERICHIA COLI  Blood Culture (routine x 2)     Status: None   Collection Time: 12/23/17  9:02 PM  Result Value Ref Range Status   Specimen Description BLOOD RIGHT ANTECUBITAL  Final   Special Requests   Final    BOTTLES DRAWN AEROBIC AND ANAEROBIC Blood Culture results may not be optimal due to an excessive volume of blood received in culture bottles   Culture   Final    NO GROWTH 5 DAYS Performed at [facility], [address], [place], [place] [address]    Report Status 12/28/2017 FINAL  Final  Blood Culture (routine x 2)     Status: None   Collection Time: 12/23/17  9:02 PM  Result Value Ref Range Status   Specimen Description BLOOD BLOOD LEFT FOREARM  Final   Special Requests   Final    BOTTLES DRAWN AEROBIC AND ANAEROBIC Blood Culture adequate volume   Culture   Final    NO GROWTH 5 DAYS Performed at [facility], [address], [place], [place] [address]    Report Status 12/28/2017 FINAL  Final  Aerobic/Anaerobic Culture (surgical/deep wound)     Status: None   Collection Time: 12/27/17 12:00 PM  Result Value Ref Range Status   Specimen Description   Final    GALL BLADDER Performed at  [facility], [address], [place], [place] [address]    Special Requests   Final    Normal Performed at [facility], [place], [place] [address]    Gram Stain NO WBC SEEN NO ORGANISMS SEEN   Final   Culture   Final    No growth aerobically or anaerobically. Performed at [facility], [place] [address], [place], [place] [address]    Report Status 01/02/2018 FINAL  Final    RADIOLOGY:  No results  found.    Management plans discussed with the patient, family and they are in agreement.  CODE STATUS:     Code Status Orders  (From admission, onward)        Start     Ordered   12/23/17 2258  Full code  Continuous     12/23/17 2257   TOTAL TIME TAKING CARE OF THIS PATIENT: 45 minutes.    Henreitta Leber M.D on 01/03/2018 at 1:24 PM  Between 7am to 6pm - Pager - [phone]  After 6pm go to [email or web address] - Proofreader  [facility]  Office  [phone]  CC: Primary care physician; Novella Rob, FNP

## 2018-01-03 NOTE — Progress Notes (Signed)
Called EMS for pt pickup.

## 2018-01-03 NOTE — Progress Notes (Signed)
Attempted x2  to feed pt. Pt refused stating he wasn't hungry.

## 2018-01-03 NOTE — Progress Notes (Signed)
Spoke with MD and CM regarding pt discharge. Confirmed discharge plan in place. Spoke with WellPointDarius Stelle - pt's son. He confirmed that either himself or his mother would be at the house all day and they were ready for him to come home. Confirmed with CM that home health was arranged as well. EMS to be called when discharge finalized.

## 2018-01-03 NOTE — Progress Notes (Signed)
Called to speak with pt's son [name]. He confirmed he was unable to come to the hospital today to review paperwork but would meet his father at home. He confirmed he requested EMS transport. Reviewed discharge paperwork with pt's son [name] via telephone. Reviewed medication changes and current medications. [name] confirmed he understood to give medication and stated he was aware of his fathers medications and had them "all set up". [name] verbalizes understanding to fill and give all antibiotic prescription which would be sent home. [name] verbalizes understanding to make follow up appointments. [name] asked how he was supposed to get his father to his appointments. Confirmed with [name] that MD spoke with him today and that he would need to make and keep appointments. [name] verbalizes and confirmed that he still wanted his father to come home and that he had also stated same to MD. Scharlene Glossarius verbalizes that he will make and keep appointments for his father. Educated [name] that discharge paperwork would be sent with EMS including script that needed to be filled. [name] verbalizes understanding and denies further questions. Confirmed with [name] that RN would notify him when EMS arrived to pick up pt.

## 2018-01-03 NOTE — Progress Notes (Signed)
CC: Cholecystostomy tube Subjective: This patient has a cholecystostomy tube in place for acute cholecystitis.  He also has a G-tube in place.  He is not conversant this morning.  Objective: Vital signs in last 24 hours: Temp:  [97.7 F (36.5 C)-98.6 F (37 C)] 98.6 F (37 C) (08/04 0553) Pulse Rate:  [67-80] 71 (08/04 0553) Resp:  [17-20] 20 (08/04 0553) BP: (121-164)/(61-139) 121/61 (08/04 0553) SpO2:  [81 %-100 %] 100 % (08/04 0553) Last BM Date: 01/01/18  Intake/Output from previous day: 08/03 0701 - 08/04 0700 In: 1143 [NG/GT:1133] Out: 75 [Drains:75] Intake/Output this shift: No intake/output data recorded.  Physical exam:  Patient keeps his eyes closed and is not conversant with me although he talks but does not answer questions.  Vital signs reviewed. ET tube in place cholecystostomy tube in place with bilious fluid Abdomen minimally tender no peritoneal signs  Lab Results: CBC  Recent Labs    12/31/17 2016 01/03/18 0607  WBC 14.2* 11.5*  HGB 8.8* 8.5*  HCT 27.5* 25.7*  PLT 411 469*   BMET Recent Labs    01/02/18 0538 01/03/18 0607  NA 138 140  K 4.3 4.5  CL 107 108  CO2 27 28  GLUCOSE 126* 124*  BUN 23 25*  CREATININE 1.19 1.11  CALCIUM 8.1* 8.1*   PT/INR No results for input(s): LABPROT, INR in the last 72 hours. ABG No results for input(s): PHART, HCO3 in the last 72 hours.  Invalid input(s): PCO2, PO2  Studies/Results: No results found.  Anti-infectives: Anti-infectives (From admission, onward)   Start     Dose/Rate Route Frequency Ordered Stop   12/25/17 1400  piperacillin-tazobactam (ZOSYN) IVPB 3.375 g     3.375 g 12.5 mL/hr over 240 Minutes Intravenous Every 8 hours 12/25/17 1248 01/03/18 2359   12/25/17 1245  piperacillin-tazobactam (ZOSYN) IVPB 3.375 g  Status:  Discontinued     3.375 g 100 mL/hr over 30 Minutes Intravenous Every 6 hours 12/25/17 1240 12/25/17 1248   12/24/17 0600  vancomycin (VANCOCIN) IVPB 1000 mg/200 mL  premix  Status:  Discontinued     1,000 mg 200 mL/hr over 60 Minutes Intravenous Every 12 hours 12/23/17 2222 12/25/17 1244   12/24/17 0000  meropenem (MERREM) 1 g in sodium chloride 0.9 % 100 mL IVPB  Status:  Discontinued     1 g 200 mL/hr over 30 Minutes Intravenous Every 8 hours 12/23/17 2222 12/25/17 1244   12/23/17 2200  vancomycin (VANCOCIN) IVPB 1000 mg/200 mL premix     1,000 mg 200 mL/hr over 60 Minutes Intravenous  Once 12/23/17 2152 12/23/17 2345   12/23/17 2015  cefTRIAXone (ROCEPHIN) 1 g in sodium chloride 0.9 % 100 mL IVPB     1 g 200 mL/hr over 30 Minutes Intravenous  Once 12/23/17 2012 12/23/17 2226      Assessment/Plan:  Patient with cholecystostomy tube in place.  Plans per internal medicine at this time for placement.  Any surgical intervention would be at the discretion of Dr. Hazle Quantintron-Diaz.  Lattie Hawichard E Izabel Chim, MD, FACS  01/03/2018

## 2018-01-03 NOTE — Progress Notes (Signed)
Attempted to feed pt. Pt took 2 bites of magic cup and refused to eat further. Per Marchelle FolksAmanda NT pt refused to eat for her as well.

## 2018-03-08 ENCOUNTER — Other Ambulatory Visit: Payer: Self-pay

## 2018-03-08 ENCOUNTER — Emergency Department
Admission: EM | Admit: 2018-03-08 | Discharge: 2018-03-08 | Disposition: A | Discharge status: Home / Self Care | Payer: Medicare Other | Attending: Emergency Medicine | Admitting: Emergency Medicine

## 2018-03-08 ENCOUNTER — Emergency Department: Payer: Medicare Other

## 2018-03-08 DIAGNOSIS — F0150 Vascular dementia without behavioral disturbance: Secondary | ICD-10-CM | POA: Insufficient documentation

## 2018-03-08 DIAGNOSIS — I129 Hypertensive chronic kidney disease with stage 1 through stage 4 chronic kidney disease, or unspecified chronic kidney disease: Secondary | ICD-10-CM | POA: Insufficient documentation

## 2018-03-08 DIAGNOSIS — Z7902 Long term (current) use of antithrombotics/antiplatelets: Secondary | ICD-10-CM | POA: Insufficient documentation

## 2018-03-08 DIAGNOSIS — L03311 Cellulitis of abdominal wall: Secondary | ICD-10-CM | POA: Diagnosis not present

## 2018-03-08 DIAGNOSIS — L0390 Cellulitis, unspecified: Secondary | ICD-10-CM

## 2018-03-08 DIAGNOSIS — E1122 Type 2 diabetes mellitus with diabetic chronic kidney disease: Secondary | ICD-10-CM | POA: Insufficient documentation

## 2018-03-08 DIAGNOSIS — R109 Unspecified abdominal pain: Secondary | ICD-10-CM | POA: Insufficient documentation

## 2018-03-08 DIAGNOSIS — G309 Alzheimer's disease, unspecified: Secondary | ICD-10-CM | POA: Insufficient documentation

## 2018-03-08 DIAGNOSIS — N183 Chronic kidney disease, stage 3 (moderate): Secondary | ICD-10-CM | POA: Diagnosis not present

## 2018-03-08 DIAGNOSIS — Z431 Encounter for attention to gastrostomy: Secondary | ICD-10-CM | POA: Diagnosis present

## 2018-03-08 HISTORY — DX: Type 2 diabetes mellitus without complications: E11.9

## 2018-03-08 LAB — CBC
HCT: 33.5 % — ABNORMAL LOW (ref 40.0–52.0)
HEMOGLOBIN: 11.1 g/dL — AB (ref 13.0–18.0)
MCH: 26.4 pg (ref 26.0–34.0)
MCHC: 33.2 g/dL (ref 32.0–36.0)
MCV: 79.6 fL — ABNORMAL LOW (ref 80.0–100.0)
Platelets: 372 10*3/uL (ref 150–440)
RBC: 4.2 MIL/uL — ABNORMAL LOW (ref 4.40–5.90)
RDW: 16.5 % — AB (ref 11.5–14.5)
WBC: 10.8 10*3/uL — ABNORMAL HIGH (ref 3.8–10.6)

## 2018-03-08 LAB — COMPREHENSIVE METABOLIC PANEL
ALK PHOS: 62 U/L (ref 38–126)
ALT: 15 U/L (ref 0–44)
ANION GAP: 10 (ref 5–15)
AST: 20 U/L (ref 15–41)
Albumin: 3.2 g/dL — ABNORMAL LOW (ref 3.5–5.0)
BUN: 22 mg/dL (ref 8–23)
CO2: 25 mmol/L (ref 22–32)
Calcium: 9.5 mg/dL (ref 8.9–10.3)
Chloride: 101 mmol/L (ref 98–111)
Creatinine, Ser: 1.28 mg/dL — ABNORMAL HIGH (ref 0.61–1.24)
GFR calc non Af Amer: 52 mL/min — ABNORMAL LOW (ref >60)
Glucose, Bld: 107 mg/dL — ABNORMAL HIGH (ref 70–99)
POTASSIUM: 4.1 mmol/L (ref 3.5–5.1)
SODIUM: 136 mmol/L (ref 135–145)
TOTAL PROTEIN: 7.9 g/dL (ref 6.5–8.1)
Total Bilirubin: 0.6 mg/dL (ref 0.3–1.2)

## 2018-03-08 MED ORDER — AMOXICILLIN-POT CLAVULANATE 875-125 MG PO TABS: 1.0000 | ORAL_TABLET | Freq: Two times a day (BID) | ORAL | 0 refills | Status: AC

## 2018-03-08 MED ORDER — IOPAMIDOL (ISOVUE-300) INJECTION 61%: 30.0000 mL | Freq: Once | INTRAVENOUS | Status: DC

## 2018-03-08 MED ORDER — AMOXICILLIN-POT CLAVULANATE 875-125 MG PO TABS
1.0000 | ORAL_TABLET | Freq: Once | ORAL | Status: AC
  Administered 2018-03-08: 1 via ORAL
  Filled 2018-03-08: qty 1

## 2018-03-08 MED ORDER — IOPAMIDOL (ISOVUE-300) INJECTION 61%
100.0000 mL | Freq: Once | INTRAVENOUS | Status: AC | PRN
  Administered 2018-03-08: 100 mL via INTRAVENOUS

## 2018-03-08 NOTE — ED Triage Notes (Signed)
Pt to ER via [facility] from home. Pt bed bound from stroke, lives at home with family. Pt here for evaluation of possible cellulitis to RUQ of abdomen. Pt was treated for UTI and had urostomy placed-pt pulled out urostomy X 1 week ago according to EMS. Pt had a doctor out to house today and was sent here due to redness and yellow drainage from area of previous urostomy tube. VSS per EMS. Pt denies pain unless area is palpated.

## 2018-03-08 NOTE — ED Provider Notes (Signed)
[facility] Emergency Department Provider Note  Time seen: 7:59 PM  I have reviewed the triage vital signs and the nursing notes.   HISTORY  Chief Complaint Cellulitis    HPI Noah Bush. is a 77 y.o. male with a past medical history of dementia, CKD, hypertension, CVA, bedbound, here with his son for evaluation of right abdominal lesion.  According to the son the patient had a urostomy tube placed to the right mid abdomen to drain urine.  The patient accidentally pulled the tube out 1 week ago, the patient has been on antibiotics ever since the tube was pulled out.  Had a doctor visit the patient at home today who is worried about an infection given tenderness around this area with mild redness and hardness under the skin and sent the patient to the emergency department for evaluation.  Here the patient appears well, no distress does have dementia and cannot contribute to his history or review of systems.   Past Medical History:  Diagnosis Date  . Alzheimer's dementia (HCC)    [name] 07/23/2017  . Cerebral infarction (HCC) 07/27/2017  . Cerebrovascular accident (CVA) (HCC) 07/23/2017  . CKD (chronic kidney disease), stage III (HCC)    [name] 07/23/2017  . Gout   . Hypertension     Patient Active Problem List   Diagnosis Date Noted  . Advanced care planning/counseling discussion   . Goals of care, counseling/discussion   . Severe sepsis (HCC) 12/23/2017  . AKI (acute kidney injury) (HCC) 12/23/2017  . UTI (urinary tract infection) 12/23/2017  . Acute cholecystitis 12/23/2017  . Cough   . Hypotension due to drugs   . Finger pain, right   . Poor nutrition   . Acute blood loss anemia   . 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 Acute ischemic right MCA stroke (HCC) 07/28/2017  . Diabetes mellitus type 2 in obese (HCC)   . Stage 3 chronic kidney disease (HCC)   . Morbid obesity (HCC)   . Cerebral infarction (HCC) 07/27/2017  .  Hyperlipidemia   . Essential hypertension   . 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 Asymmetry of cerebral ventricles, Acute, Acquired  07/23/2017  . Encounter for screening colonoscopy 03/12/2015    Past Surgical History:  Procedure Laterality Date  . COLONOSCOPY WITH PROPOFOL N/A 05/09/2015   Procedure: COLONOSCOPY WITH PROPOFOL;  Surgeon: [name], MD;  Location: [facility] ENDOSCOPY;  Service: Endoscopy;  Laterality: N/A;  . IR GASTROSTOMY TUBE MOD SED  08/13/2017  . TIBIA FRACTURE SURGERY Left     Prior to Admission medications   Medication Sig Start Date End Date Taking? Authorizing Provider  acetaminophen (TYLENOL) 325 MG tablet Take 1-2 tablets (325-650 mg total) by mouth every 4 (four) hours as needed for mild pain. 08/24/17   Love, Evlyn Kanner, PA-C  albuterol (PROVENTIL) (2.5 MG/3ML) 0.083% nebulizer solution Take 3 mLs (2.5 mg total) by nebulization every 6 (six) hours as needed for wheezing or shortness of breath. 08/24/17   Love, Evlyn Kanner, PA-C  Amino Acids-Protein Hydrolys (FEEDING SUPPLEMENT, PRO-STAT SUGAR FREE 64,) LIQD Place 30 mLs into feeding tube 3 (three) times daily between meals. 08/24/17   Love, Evlyn Kanner, PA-C  aspirin EC 81 MG tablet Take 81 mg by mouth daily.    [provider]  atorvastatin (LIPITOR) 80 MG tablet Take 1 tablet (80 mg total) by mouth daily at 6 PM. Patient taking differently: Take 40 mg by mouth daily  at 6 PM.  07/28/17   Garnette Gunner, MD  clopidogrel (PLAVIX) 75 MG tablet Take 1 tablet (75 mg total) by mouth daily. 08/25/17   Love, Evlyn Kanner, PA-C  latanoprost (XALATAN) 0.005 % ophthalmic solution Place 1 drop into the left eye at bedtime. 08/24/17   Love, Evlyn Kanner, PA-C  memantine (NAMENDA) 5 MG tablet Take 5 mg by mouth daily.    [provider]  Menthol-Methyl Salicylate (MUSCLE RUB) 10-15 % CREA Apply 1 application topically 4 (four) times daily. To bilateral hands and bilateral knees 08/24/17   Love, Evlyn Kanner,  PA-C  methylphenidate (RITALIN) 5 MG tablet Take 1 tablet (5 mg total) by mouth 2 (two) times daily with breakfast and lunch. 08/24/17   Love, Evlyn Kanner, PA-C  metoprolol succinate (TOPROL-XL) 25 MG 24 hr tablet Take 0.5 tablets (12.5 mg total) by mouth daily. 07/29/17   Garnette Gunner, MD  multivitamin (RENA-VIT) TABS tablet Take 1 tablet by mouth at bedtime. 08/24/17   Love, Evlyn Kanner, PA-C  Nutritional Supplements (FEEDING SUPPLEMENT, OSMOLITE 1.5 CAL,) LIQD Place 1,000 mLs into feeding tube daily. 01/03/18   Houston Siren, MD  Water For Irrigation, Sterile (FREE WATER) SOLN Place 150 mLs into feeding tube 4 (four) times daily. 01/03/18   Houston Siren, MD    No Known Allergies  Family History  Problem Relation Age of Onset  . Diabetes Paternal Grandfather     Social History Social History   Tobacco Use  . Smoking status: Never Smoker  . Smokeless tobacco: Never Used  Substance Use Topics  . Alcohol use: Yes    Alcohol/week: 2.0 standard drinks    Types: 2 Cans of beer per week  . Drug use: No    Review of Systems Unable to obtain adequate/accurate review of systems secondary to Alzheimer's dementia.  ____________________________________________   PHYSICAL EXAM:  VITAL SIGNS: ED Triage Vitals  Enc Vitals Group     BP 03/08/18 1846 (!) 104/52     Pulse Rate 03/08/18 1846 82     Resp 03/08/18 1846 18     Temp 03/08/18 1846 99.1 F (37.3 C)     Temp Source 03/08/18 1846 Oral     SpO2 03/08/18 1846 98 %     Weight 03/08/18 1847 249 lb 1.9 oz (113 kg)     Height 03/08/18 1847 5\' 11"  (1.803 m)     Head Circumference --      Peak Flow --      Pain Score 03/08/18 1847 0     Pain Loc --      Pain Edu? --      Excl. in GC? --     Constitutional: Alert, no distress, overall well-appearing Eyes: Normal exam ENT   Head: Normocephalic and atraumatic.   Mouth/Throat: Mucous membranes are moist. Cardiovascular: Normal rate, regular rhythm.  Respiratory: Normal  respiratory effort without tachypnea nor retractions. Breath sounds are clear  Gastrointestinal: Abdomen is soft, G-tube is present in the left abdomen.  Tube insertion site to the right mid abdomen is quite indurated with mild erythema around insertion site with a very small amount of exudate draining from insertion site. Musculoskeletal: Nontender with normal range of motion in all extremities Neurologic:  Normal speech and language. No gross focal neurologic deficits  Skin:  Skin is warm.  Mild erythema around insertion site in the right mid abdomen. Psychiatric: Mood and affect are normal.   ____________________________________________    RADIOLOGY  IMPRESSION: 1. 4.1 x 2.1 cm soft tissue density in the anterolateral right abdominal wall along the tract of the previous cholecystostomy tube with soft tissue density in the other portions of the tract, possibly enhancing. This may represent scar tissue, inflammatory tissue or infectious changes. No abscess is seen. 2. Small amount of fluid along the posterior aspect of the anterolateral abdominal wall on the right at the level of the tract, most likely representing reactive fluid. 3. Cholelithiasis. 4. 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 5. Mild right basilar atelectasis and minimal left basilar atelectasis. 6. Bilateral gynecomastia, not included in its entirety.  ____________________________________________   INITIAL IMPRESSION / ASSESSMENT AND PLAN / ED COURSE  Pertinent labs & imaging results that were available during my care of the patient were reviewed by me and considered in my medical decision making (see chart for details).  Patient presents emergency department for right mid abdomen pain/redness and induration around tube insertion site.  Differential this time would include superficial cellulitis, subcutaneous hematoma, abscess.  We will check  labs and obtain CT imaging the abdomen/pelvis to further evaluate and rule out deeper infection/abscess.  Patient's son agreeable to plan of care.  I reviewed the patient's records, despite the family's insistence that this is a urostomy tube records show that this is a cholecystostomy tube which would explain the positioning much better.  CT scan pending.  Reassuringly patient's white blood cell count currently 10.8.  Discussed the CT scan with general surgery Dr. Earlene Plater.  He has reviewed the CT images in addition to myself.  Does not appear consistent with abscess.  We will continue the Augmentin which was prescribed by the primary care doctor.  Patient will follow-up with Dr. Earlene Plater this week in the office.  Son is agreeable to this plan of care.  ____________________________________________   FINAL CLINICAL IMPRESSION(S) / ED DIAGNOSES  abdominal pain Cellulitis    Minna Antis, MD 03/08/18 2206

## 2018-03-08 NOTE — Discharge Instructions (Addendum)
Please call the number provided for general surgery tomorrow morning to arrange a follow-up appointment this week for recheck/reevaluation.  Return to the emergency department for any fever 101 or higher, increased abdominal pain, or any other symptom personally concerning to yourself.

## 2018-03-08 NOTE — Progress Notes (Signed)
Aersol trach collar setup left at the bedside per RN in charge of the pt , SpO2 97% RR 25 hr 60

## 2018-03-08 NOTE — ED Notes (Signed)
EMS arrived to transport pt back home.

## 2018-03-08 NOTE — ED Notes (Signed)
Primary nurse, Devan at bedside.

## 2018-03-08 NOTE — ED Notes (Signed)
EMS called to transport back home

## 2018-03-08 NOTE — ED Notes (Signed)
Discharge instructions reviewed with pt's son, Lenon Curt, and son verbalized understanding. Questions and concerns were addressed.

## 2018-03-11 ENCOUNTER — Ambulatory Visit: Payer: Medicare Other | Admitting: Surgery

## 2018-03-18 ENCOUNTER — Ambulatory Visit (INDEPENDENT_AMBULATORY_CARE_PROVIDER_SITE_OTHER): Payer: Medicare Other | Admitting: Surgery

## 2018-03-18 ENCOUNTER — Other Ambulatory Visit: Payer: Self-pay

## 2018-03-18 ENCOUNTER — Encounter: Payer: Self-pay | Admitting: Surgery

## 2018-03-18 VITALS — BP 110/60 | HR 72 | Temp 96.8°F | Resp 13 | Ht 71.0 in | Wt 249.0 lb

## 2018-03-18 DIAGNOSIS — T8579XS Infection and inflammatory reaction due to other internal prosthetic devices, implants and grafts, sequela: Secondary | ICD-10-CM

## 2018-03-18 NOTE — Patient Instructions (Addendum)
Patient has been informed to follow up with Dr.Cintron Trisha Mangle on 03/19/18 @ 10:15 a.m. Patient has been informed to use an abdominal binder.    Cellulitis, Adult Cellulitis is a skin infection. The infected area is usually red and sore. This condition occurs most often in the arms and lower legs. It is very important to get treated for this condition. Follow these instructions at home:  Take over-the-counter and prescription medicines only as told by your doctor.  If you were prescribed an antibiotic medicine, take it as told by your doctor. Do not stop taking the antibiotic even if you start to feel better.  Drink enough fluid to keep your pee (urine) clear or pale yellow.  Do not touch or rub the infected area.  Raise (elevate) the infected area above the level of your heart while you are sitting or lying down.  Place warm or cold wet cloths (warm or cold compresses) on the infected area. Do this as told by your doctor.  Keep all follow-up visits as told by your doctor. This is important. These visits let your doctor make sure your infection is not getting worse. Contact a doctor if:  You have a fever.  Your symptoms do not get better after 1-2 days of treatment.  Your bone or joint under the infected area starts to hurt after the skin has healed.  Your infection comes back. This can happen in the same area or another area.  You have a swollen bump in the infected area.  You have new symptoms.  You feel ill and also have muscle aches and pains. Get help right away if:  Your symptoms get worse.  You feel very sleepy.  You throw up (vomit) or have watery poop (diarrhea) for a long time.  There are red streaks coming from the infected area.  Your red area gets larger.  Your red area turns darker. This information is not intended to replace advice given to you by your health care provider. Make sure you discuss any questions you have with your health care  provider. Document Released: 11/05/2007 Document Revised: 10/25/2015 Document Reviewed: 03/28/2015 Elsevier Interactive Patient Education  2018 [employer].

## 2018-03-18 NOTE — Progress Notes (Signed)
Surgical Clinic History & Physical   HPI:  77 y.o. Male patient of Dr. Hazle Quant mistakenly presented/was referred from [facility] ED to our clinic today after he recently self-removed his percutaneous cholecystostomy drain. When last evaluated by Dr. Hazle Quant on 01/19/2018, follow-up cholangiogram was advised prior to consideration for removal of cholecystostomy drain, placed for acute cholecystitis following patient's recent stroke. Since that time, patient has not eaten much fatty foods, receiving nutrition primarily via his PEG tube, supplemented by non-fatty oral nutrition. Patient denies any pain since removing his drain, but presented to [facility] ED for peri-drain site erythema and reportedly small amount of purulent drainage. Antibiotics were prescribed, and patient's son describes both the redness and drainage have completely resolved with healing of the prior wound. Patient otherwise denies any fever/chills, abdominal pain, N/V, CP, or SOB.  Review of Systems:  Constitutional: denies any other weight loss, fever, chills, or sweats  Eyes: denies any other vision changes, history of eye injury  ENT: denies sore throat, hearing problems  Respiratory: denies shortness of breath, wheezing  Cardiovascular: denies chest pain, palpitations  Gastrointestinal: abdominal pain, N/V, and bowel function as per HPI Musculoskeletal: denies any other joint pains or cramps  Skin: Denies any other rashes or skin discolorations  Neurological: denies any other headache, dizziness, weakness  Psychiatric: denies any other depression, anxiety  All other review of systems: otherwise negative   Past Medical History:  Diagnosis Date  . Alzheimer's dementia (HCC)    [name] 07/23/2017  . Cerebral infarction (HCC) 07/27/2017  . Cerebrovascular accident (CVA) (HCC) 07/23/2017  . CKD (chronic kidney disease), stage III (HCC)    [name] 07/23/2017  . Diabetes mellitus without complication (HCC)   . Gout   .  Hypertension    Past Surgical History:  Procedure Laterality Date  . COLONOSCOPY WITH PROPOFOL N/A 05/09/2015   Procedure: COLONOSCOPY WITH PROPOFOL;  Surgeon: [name], MD;  Location: [facility] ENDOSCOPY;  Service: Endoscopy;  Laterality: N/A;  . IR GASTROSTOMY TUBE MOD SED  08/13/2017  . TIBIA FRACTURE SURGERY Left    Social History   Socioeconomic History  . Marital status: Married    Spouse name: Not on file  . Number of children: Not on file  . Years of education: Not on file  . Highest education level: Not on file  Occupational History  . Not on file  Social Needs  . Financial resource strain: Not on file  . Food insecurity:    Worry: Not on file    Inability: Not on file  . Transportation needs:    Medical: Not on file    Non-medical: Not on file  Tobacco Use  . Smoking status: Never Smoker  . Smokeless tobacco: Never Used  Substance and Sexual Activity  . Alcohol use: Yes    Alcohol/week: 2.0 standard drinks    Types: 2 Cans of beer per week  . Drug use: No  . Sexual activity: Not on file  Lifestyle  . Physical activity:    Days per week: Not on file    Minutes per session: Not on file  . Stress: Not on file  Relationships  . Social connections:    Talks on phone: Not on file    Gets together: Not on file    Attends religious service: Not on file    Active member of club or organization: Not on file    Attends meetings of clubs or organizations: Not on file    Relationship status: Not  on file  . Intimate partner violence:    Fear of current or ex partner: Not on file    Emotionally abused: Not on file    Physically abused: Not on file    Forced sexual activity: Not on file  Other Topics Concern  . Not on file  Social History Narrative  . Not on file   Vital Signs:  BP 110/60   Pulse 72   Temp (!) 96.8 F (36 C) (Temporal)   Resp 13   Ht 5\' 11"  (1.803 m)   Wt 249 lb (112.9 kg)   SpO2 97%   BMI 34.73 kg/m    Physical Exam:   Constitutional:  -- Normal body habitus  -- Awake, alert, and oriented x3 -- Wheelchair-bound with impaired mobility Eyes:  -- Pupils equally round and reactive to light  -- No scleral icterus  Ear, nose, throat:  -- No jugular venous distension  -- No nasal drainage, bleeding Pulmonary:  -- No crackles -- Equal breath sounds bilaterally -- Breathing non-labored at rest Cardiovascular:  -- S1, S2 present  -- No pericardial rubs  Gastrointestinal:  -- Soft, nontender, non-distended, no guarding/rebound  -- No abdominal masses appreciated, pulsatile or otherwise  Musculoskeletal / Integumentary:  -- Wounds or skin discoloration: Right flank former percutaneous cholecystostomy drain site non-tender to palpation and closed/healing well without any surrounding erythema, fluctuance, or drainage  -- Extremities: hands and feet warm, no edema    Laboratory studies:  CBC Latest Ref Rng & Units 03/08/2018 01/03/2018 12/31/2017  WBC 3.8 - 10.6 K/uL 10.8(H) 11.5(H) 14.2(H)  Hemoglobin 13.0 - 18.0 g/dL 11.1(L) 8.5(L) 8.8(L)  Hematocrit 40.0 - 52.0 % 33.5(L) 25.7(L) 27.5(L)  Platelets 150 - 440 K/uL 372 469(H) 411   CMP Latest Ref Rng & Units 03/08/2018 01/03/2018 01/02/2018  Glucose 70 - 99 mg/dL 161(W) 960(A) 540(J)  BUN 8 - 23 mg/dL 22 81(X) 23  Creatinine 0.61 - 1.24 mg/dL 9.14(N) 8.29 5.62  Sodium 135 - 145 mmol/L 136 140 138  Potassium 3.5 - 5.1 mmol/L 4.1 4.5 4.3  Chloride 98 - 111 mmol/L 101 108 107  CO2 22 - 32 mmol/L 25 28 27   Calcium 8.9 - 10.3 mg/dL 9.5 1.3(Y) 8.1(L)  Total Protein 6.5 - 8.1 g/dL 7.9 8.6(V) 7.8(I)  Total Bilirubin 0.3 - 1.2 mg/dL 0.6 0.4 0.4  Alkaline Phos 38 - 126 U/L 62 120 144(H)  AST 15 - 41 U/L 20 24 30   ALT 0 - 44 U/L 15 38 50(H)   Imaging: CT Abdomen and Pelvis with contrast (03/08/2018) - personally reviewed and discussed with patient and his son 1. 4.1 x 2.1 cm soft tissue density in the anterolateral right abdominal wall along the tract of the previous  cholecystostomy tube with soft tissue density in the other portions of the tract, possibly enhancing. This may represent scar tissue, inflammatory tissue or infectious changes. No abscess is seen. 2. Small amount of fluid along the posterior aspect of the anterolateral abdominal wall on the right at the level of the tract, most likely representing reactive fluid. 3. Cholelithiasis. 4. 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 5. Mild right basilar atelectasis and minimal left basilar atelectasis. 6. Bilateral gynecomastia, not included in its entirety.   Assessment:  77 y.o. yo Male with a problem list including...  Patient Active Problem List   Diagnosis Date Noted  . Advanced care planning/counseling discussion   . Goals of care, counseling/discussion   .  Severe sepsis (HCC) 12/23/2017  . AKI (acute kidney injury) (HCC) 12/23/2017  . UTI (urinary tract infection) 12/23/2017  . Acute cholecystitis 12/23/2017  . Cough   . Hypotension due to drugs   . Finger pain, right   . Poor nutrition   . Acute blood loss anemia   . 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 Acute ischemic right MCA stroke (HCC) 07/28/2017  . Diabetes mellitus type 2 in obese (HCC)   . Stage 3 chronic kidney disease (HCC)   . Morbid obesity (HCC)   . Cerebral infarction (HCC) 07/27/2017  . Hyperlipidemia   . Essential hypertension   . 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 Asymmetry of cerebral ventricles, Acute, Acquired  07/23/2017  . Encounter for screening colonoscopy 03/12/2015    presents to clinic for evaluation of post-cholecystostomy drain site after patient self-removed cholecystostomy drain placed for prior acute cholecystitis following a major stroke with former drain site now appearing to be healing well with no symptoms at this time to suggest ongoing cholecystitis.  Plan:   - complete  prescribed course of antibiotics as prescribed  - outpatient surgical follow-up with Dr. Hazle Quant was advised and scheduled by our office for patient, but patient and his son state preference to follow-up with patient's primary care physician, who sees the wheelchair-bound patient at his son's house due to difficulty with travel challenges  - discussed additional imaging such as ultrasound vs monitoring clinical exam, and again patient's son requests to minimize further imaging due to travel challenges  - discussed patient's former cholecystitis diagnosis, reasons for which to call Dr. Will Bonnet office or return to [facility] ED  - abdominal binder also advised for PEG to reduce risk of self-removal  - Dr. Hazle Quant updated accordingly  All of the above recommendations were discussed with the patient and patient's son, and all of patient's and family's questions were answered to their expressed satisfaction.  -- Scherrie Gerlach Earlene Plater, MD, RPVI [place]: [facility] General Surgery - Partnering for exceptional care. Office: [phone]

## 2018-08-06 ENCOUNTER — Telehealth: Payer: Self-pay | Admitting: Nurse Practitioner

## 2018-08-06 NOTE — Telephone Encounter (Signed)
I called Noah Bush's wife, Noah Bush and schedule in-home pc follow-up visit for Friday, August 20, 2018 at 10am

## 2018-08-20 ENCOUNTER — Other Ambulatory Visit: Payer: Self-pay

## 2018-08-20 ENCOUNTER — Other Ambulatory Visit: Payer: Medicare Other | Admitting: Nurse Practitioner

## 2018-08-20 ENCOUNTER — Encounter: Payer: Self-pay | Admitting: Nurse Practitioner

## 2018-08-20 DIAGNOSIS — R5381 Other malaise: Secondary | ICD-10-CM | POA: Insufficient documentation

## 2018-08-20 DIAGNOSIS — R630 Anorexia: Secondary | ICD-10-CM | POA: Insufficient documentation

## 2018-08-20 DIAGNOSIS — Z515 Encounter for palliative care: Secondary | ICD-10-CM | POA: Insufficient documentation

## 2018-08-20 NOTE — Progress Notes (Signed)
Therapist, nutritional Palliative Care Consult Note Telephone: [phone]  Fax: [phone]  PATIENT NAME: Noah Bush. DOB: 1940-10-05 MRN: [id number]  PRIMARY CARE PROVIDER:   Garnet Koyanagi, FNP  REFERRING PROVIDER:  Garnet Koyanagi, FNP [address] [place], [place] [address]  RESPONSIBLE PARTY:   Ms. Drum  RECOMMENDATIONS and PLAN:  1. Palliative care encounter Z51.5; Palliative medicine team will continue to support patient, patient's family, and medical team. Visit consisted of counseling and education dealing with the complex and emotionally intense issues of symptom management and palliative care in the setting of serious and potentially life-threatening illness  2. Debility R53.81 secondary to late onset CVA progressive, encourage passive rom; encourage to transfer to geri-chair.   3. Anorexia R63.0 secondary to protein calorie malnutrition supplements, supportive measures and courage to eat  ASSESSMENT:     I visited and observed Noah Bush. We talked about purpose of palliative care visit and consent obtained and signed by Ms Bortner. Asked Noah. Bush how he was feeling today and he seems hard of hearing. He endorses he is doing okay. I asked if he was having symptoms of pain and he shared that intermittently at times around the G-tube it does give him some discomfort. He talked about wishes of having the G-tube removed. We talked about his appetite. Ms. Huebsch endorses that is appetite varies depending on the day. Ms. Anders endorses  he has appeared to have lost weight although not able to weigh him on a scale. He has remained in bed for the last year. She shared that she does have helped that comes in to help bathe him several times a weak as well as her son and daughter who helped her. She is wheelchair-bound herself. We talked about the last time he was independent, past medical history and progression of chronic disease. We talked about his  functional ability. We talked about medical goals of care including aggressive versus conservative versus comfort care. We talked about DNR as he currently is a full code. Ms Maberry endorses that she would like for Noah. Bush to decide and it was okay to discuss this with Noah. Bush today. I discuss with Noah. Bush code status in CPR. We talked about aggressive interventions versus comfort care. We talked about ventilators, ICU hospitalizations and reviewed most form. Hard choices book given to miss petite. Noah. Bush endorses that he would not want to have CPR. His wishes are to be a do not resuscitate. To [facility] forms completed and Ms. Farra in agreement. Blank most form left for further discussion at next visit. He was cooperative with assessment. We talked about role of palliative care and plan of care. At present time will continue to Monitor and follow with palliative care and if he does lose more weight with continued decrease appetite may consider hospice and we'll revisit that at next visit in 4 weeks if needed or sooner should he declined. Noah and Ms Yannuzzi in agreement. Therapeutic listening and emotional support provided. Questions answered to satisfaction. Contact information provided. Appointment scheduled.  I spent 90 minutes providing this consultation,  from 10:15am to 11:45am. More than 50% of the time in this consultation was spent coordinating communication.   HISTORY OF PRESENT ILLNESS:  Noah Bush. is a 78 y.o. year old male with multiple medical problems including Alzheimer's and vascular dementia, late onset CVA, diabetes, chronic kidney disease, hypertension, gout, anemia, morbid obesity, hyperlipidemia, left tibial fracture s/p surgery,  gastrostomy tube. Last hospitalization 7 / 24 / 2019 to 8 / 4 / 2019 first sepsis secondary to acute polycystitis requiring IV antibiotics not deemed a good surgical candidate and underwent a cholecystectomy tube. Blood cultures negative. Acute  kidney injury secondary to sepsis with UTI positive for e-coli. Anemia of chronic disease. Noah. Razzano resides at home with his wife. He is functionally bed-bound, Total Care, incontinent bowel and bladder. He does require to be fed and he does take oral intake liquids as well as Foods. He does have a gastrostomy tube in place though it is not in use. This petite endorses he does have some times of confusion intermittently. Ms Gerwig endorses his primary care provider Dr. Laural Benes does make house calls for his primary visit. No recent wounds, Falls, hospitalizations. Ms Winthrop endorses that she feels like he has lost weight although it is not feasible to weigh him. At present he is lying in a hospital bed in the living room. He appears debilitated and comfortable. Ms Barkdull is with him. . Palliative Care was asked to help address goals of care.   CODE STATUS: DNR  PPS: 30% HOSPICE ELIGIBILITY/DIAGNOSIS: TBD  PAST MEDICAL HISTORY:  Past Medical History:  Diagnosis Date   Alzheimer's dementia (HCC)    /notes 07/23/2017   Cerebral infarction (HCC) 07/27/2017   Cerebrovascular accident (CVA) (HCC) 07/23/2017   CKD (chronic kidney disease), stage III (HCC)    [name] 07/23/2017   Diabetes mellitus without complication (HCC)    Gout    Hypertension     SOCIAL HX:  Social History   Tobacco Use   Smoking status: Never Smoker   Smokeless tobacco: Never Used  Substance Use Topics   Alcohol use: Yes    Alcohol/week: 2.0 standard drinks    Types: 2 Cans of beer per week    ALLERGIES: No Known Allergies   PERTINENT MEDICATIONS:  Outpatient Encounter Medications as of 08/20/2018  Medication Sig   acetaminophen (TYLENOL) 325 MG tablet Take 1-2 tablets (325-650 mg total) by mouth every 4 (four) hours as needed for mild pain.   albuterol (PROVENTIL) (2.5 MG/3ML) 0.083% nebulizer solution Take 3 mLs (2.5 mg total) by nebulization every 6 (six) hours as needed for wheezing or shortness of  breath.   Amino Acids-Protein Hydrolys (FEEDING SUPPLEMENT, PRO-STAT SUGAR FREE 64,) LIQD Place 30 mLs into feeding tube 3 (three) times daily between meals.   aspirin EC 81 MG tablet Take 81 mg by mouth daily.   atorvastatin (LIPITOR) 80 MG tablet Take 1 tablet (80 mg total) by mouth daily at 6 PM. (Patient taking differently: Take 40 mg by mouth daily at 6 PM. )   clopidogrel (PLAVIX) 75 MG tablet Take 1 tablet (75 mg total) by mouth daily.   latanoprost (XALATAN) 0.005 % ophthalmic solution Place 1 drop into the left eye at bedtime.   memantine (NAMENDA) 5 MG tablet Take 5 mg by mouth daily.   Menthol-Methyl Salicylate (MUSCLE RUB) 10-15 % CREA Apply 1 application topically 4 (four) times daily. To bilateral hands and bilateral knees   methylphenidate (RITALIN) 5 MG tablet Take 1 tablet (5 mg total) by mouth 2 (two) times daily with breakfast and lunch.   metoprolol succinate (TOPROL-XL) 25 MG 24 hr tablet Take 0.5 tablets (12.5 mg total) by mouth daily.   multivitamin (RENA-VIT) TABS tablet Take 1 tablet by mouth at bedtime.   NON FORMULARY    Nutritional Supplements (FEEDING SUPPLEMENT, OSMOLITE 1.5 CAL,) LIQD Place 1,000 mLs  into feeding tube daily.   Water For Irrigation, Sterile (FREE WATER) SOLN Place 150 mLs into feeding tube 4 (four) times daily.   No facility-administered encounter medications on file as of 08/20/2018.     PHYSICAL EXAM:   General: NAD, chronically ill, debilitated, obese male Cardiovascular: regular rate and rhythm Pulmonary: clear ant fields Abdomen: soft, nontender, + bowel sounds GU: no suprapubic tenderness Extremities: mild edema, no joint deformities Skin: no rashes Neurological: Weakness but otherwise nonfocal/functional quadriplegic  Jurrell Royster Prince [place], NP

## 2018-09-16 ENCOUNTER — Telehealth: Payer: Self-pay

## 2018-09-16 NOTE — Telephone Encounter (Signed)
Spoke with male family member, they do not have access to smart phone. Appointment changed to telephonic vist

## 2018-09-21 ENCOUNTER — Other Ambulatory Visit: Payer: Self-pay

## 2018-09-21 ENCOUNTER — Encounter: Payer: Self-pay | Admitting: Nurse Practitioner

## 2018-09-21 ENCOUNTER — Other Ambulatory Visit: Payer: Medicare Other | Admitting: Nurse Practitioner

## 2018-09-21 ENCOUNTER — Telehealth: Payer: Self-pay | Admitting: Nurse Practitioner

## 2018-09-21 DIAGNOSIS — Z515 Encounter for palliative care: Secondary | ICD-10-CM

## 2018-09-21 DIAGNOSIS — R5381 Other malaise: Secondary | ICD-10-CM

## 2018-09-21 DIAGNOSIS — R630 Anorexia: Secondary | ICD-10-CM

## 2018-09-21 NOTE — Telephone Encounter (Signed)
I attempted to call Mr/Mrs. Christner as scheduled for telephone/telehealth follow-up palliative care visit. No answer/unable to leave a message. Will continue to attempt to contact.

## 2018-09-21 NOTE — Telephone Encounter (Signed)
I called Noah Bush FMP updated on case and  in agreement with hospice screening. I called Noah Bush, [name]'s son. Case discuss. We talked about medical goals in option of hospice screening. Noah Bush in agreement and wishes to pursue hospice screening

## 2018-09-21 NOTE — Progress Notes (Signed)
Therapist, nutritional Palliative Care Consult Note Telephone: [phone]  Fax: [phone]  PATIENT NAME: Noah Bush. DOB: 1940/08/02 MRN: [id number]  PRIMARY CARE PROVIDER:   Garnet Koyanagi, FNP  REFERRING PROVIDER:  Garnet Koyanagi, FNP [address] [place], [place] [address]  RESPONSIBLE PARTY:   Ms. Meader [phone]  Due to the COVID-19 crisis, this visit was done viaTelephone visit today as Telehealth equipment not available for Family proceed telemedicine from my office and it was initiated and consent by this patient and or family.  RECOMMENDATIONS and PLAN:  1. Palliative care encounter Z51.5; Palliative medicine team will continue to support patient, patient's family, and medical team. Visit consisted of counseling and education dealing with the complex and emotionally intense issues of symptom management and palliative care in the setting of serious and potentially life-threatening illness  2. Debility R53.81 secondary to late onset CVA progressive, encourage passive rom; encourage to transfer to geri-chair.   3. Anorexia R63.0 secondary to protein calorie malnutrition supplements, supportive measures and courage to eat  ASSESSMENT:     I called and spoke with Ms Mellen on the phone with [name] and son. We talked about update since last palliative care visit on 08/20/2018. He continues to have a declined appetite. Ms Deasis endorses the area on his right side has some irritation and drainage. We talked about no further changes as he does appear to be chronically ill, debilitated. We talked about medical goals of care as completed DNR last palliative care visit. Ms. Denbo was continuing to read her choice book. We talked about role of palliative care and plan of care. Talked about option of Hospice Services through Medicare and what services they provide. Ms. Croxford endorses he was evaluated back in 2014 by hospice for services but  found that he was not eligible at that time. Since that time he is bed-bound, total ADL dependence, with incontinence. He has a gastrostomy tube which is not currently being used. He is taking oral food though appetite continue to be declined. Asked Ms. Fluhr if it was okay to discuss case with medical director of hospice to see about eligibility, she was in agreement. Ms. Vieyra was in agreement.  I updated Dr. Jamie Brookes / Dr. Dan Humphreys concerning [name] and felt like he was hospice eligible. I returned call to Miss petite and updated her on option of hospice screening. She asked that I further discuss with [name] her son and I called [phone], message left to return call.  I spent 60 minutes providing this consultation,  from 12:30pm to 1:30pm. More than 50% of the time in this consultation was spent coordinating communication.   HISTORY OF PRESENT ILLNESS:  Noah Bush. is a 78 y.o. year old male with multiple medical problems including Alzheimer's and vascular dementia, late onset CVA, diabetes, chronic kidney disease, hypertension, gout, anemia, morbid obesity, hyperlipidemia, left tibial fracture s/p surgery, gastrostomy tube.Last hospitalization 7 / 24 / 2019 to 8 / 4 / 2019 first sepsis secondary to acute polycystitis requiring IV antibiotics not deemed a good surgical candidate and underwent a cholecystectomy tube. Blood cultures negative. Acute kidney injury secondary to sepsis with UTI positive for e-coli. Anemia of chronic disease. [name] resides at home with his wife. He is functionally bed-bound, Total Care, incontinent bowel and bladder. He does require to be fed and he does take oral intake liquids as well as Foods. He does have a gastrostomy tube in  place though it is not in use. This petite endorses he does have some times of confusion intermittently. Ms Safi endorses his primary care provider Dr. Laural BenesJohnson does make house calls for his primary visit. [name] continues to  reside at home. He remains bed-bound, total ADL dependence, requires positioning with pillows. He is incontinent bowel and bladder. He does require to be fed with appetite declined. He was made a DNR at last palliative care face-to-face visit 3 / 20 / 2020.Marland Kitchen. Palliative Care was asked to help address goals of care.   CODE STATUS: DNR  PPS: 30% HOSPICE ELIGIBILITY/DIAGNOSIS: appears <6 months with clinical presentation  PAST MEDICAL HISTORY:  Past Medical History:  Diagnosis Date   Alzheimer's dementia (HCC)    /notes 07/23/2017   Cerebral infarction (HCC) 07/27/2017   Cerebrovascular accident (CVA) (HCC) 07/23/2017   CKD (chronic kidney disease), stage III (HCC)    [name]/notes 07/23/2017   Diabetes mellitus without complication (HCC)    Gout    Hypertension     SOCIAL HX:  Social History   Tobacco Use   Smoking status: Never Smoker   Smokeless tobacco: Never Used  Substance Use Topics   Alcohol use: Yes    Alcohol/week: 2.0 standard drinks    Types: 2 Cans of beer per week    ALLERGIES: No Known Allergies   PERTINENT MEDICATIONS:  Outpatient Encounter Medications as of 09/21/2018  Medication Sig   acetaminophen (TYLENOL) 325 MG tablet Take 1-2 tablets (325-650 mg total) by mouth every 4 (four) hours as needed for mild pain.   albuterol (PROVENTIL) (2.5 MG/3ML) 0.083% nebulizer solution Take 3 mLs (2.5 mg total) by nebulization every 6 (six) hours as needed for wheezing or shortness of breath.   Amino Acids-Protein Hydrolys (FEEDING SUPPLEMENT, PRO-STAT SUGAR FREE 64,) LIQD Place 30 mLs into feeding tube 3 (three) times daily between meals.   aspirin EC 81 MG tablet Take 81 mg by mouth daily.   atorvastatin (LIPITOR) 80 MG tablet Take 1 tablet (80 mg total) by mouth daily at 6 PM. (Patient taking differently: Take 40 mg by mouth daily at 6 PM. )   clopidogrel (PLAVIX) 75 MG tablet Take 1 tablet (75 mg total) by mouth daily.   latanoprost (XALATAN) 0.005 % ophthalmic  solution Place 1 drop into the left eye at bedtime.   memantine (NAMENDA) 5 MG tablet Take 5 mg by mouth daily.   Menthol-Methyl Salicylate (MUSCLE RUB) 10-15 % CREA Apply 1 application topically 4 (four) times daily. To bilateral hands and bilateral knees   methylphenidate (RITALIN) 5 MG tablet Take 1 tablet (5 mg total) by mouth 2 (two) times daily with breakfast and lunch.   metoprolol succinate (TOPROL-XL) 25 MG 24 hr tablet Take 0.5 tablets (12.5 mg total) by mouth daily.   multivitamin (RENA-VIT) TABS tablet Take 1 tablet by mouth at bedtime.   NON FORMULARY    Nutritional Supplements (FEEDING SUPPLEMENT, OSMOLITE 1.5 CAL,) LIQD Place 1,000 mLs into feeding tube daily.   Water For Irrigation, Sterile (FREE WATER) SOLN Place 150 mLs into feeding tube 4 (four) times daily.   No facility-administered encounter medications on file as of 09/21/2018.     PHYSICAL EXAM:   Deferred  Jadee Golebiewski Z Mohmed Farver, NP

## 2018-10-08 ENCOUNTER — Inpatient Hospital Stay (HOSPITAL_COMMUNITY)
Admission: EM | Admit: 2018-10-08 | Discharge: 2018-11-01 | DRG: 061 | Disposition: E | Payer: Medicare Other | Attending: Neurology | Admitting: Neurology

## 2018-10-08 ENCOUNTER — Encounter (HOSPITAL_COMMUNITY): Payer: Self-pay

## 2018-10-08 ENCOUNTER — Emergency Department (HOSPITAL_COMMUNITY): Payer: Medicare Other

## 2018-10-08 DIAGNOSIS — Z1159 Encounter for screening for other viral diseases: Secondary | ICD-10-CM | POA: Diagnosis not present

## 2018-10-08 DIAGNOSIS — I63312 Cerebral infarction due to thrombosis of left middle cerebral artery: Secondary | ICD-10-CM | POA: Diagnosis not present

## 2018-10-08 DIAGNOSIS — R29733 NIHSS score 33: Secondary | ICD-10-CM | POA: Diagnosis present

## 2018-10-08 DIAGNOSIS — R402312 Coma scale, best motor response, none, at arrival to emergency department: Secondary | ICD-10-CM | POA: Diagnosis present

## 2018-10-08 DIAGNOSIS — E785 Hyperlipidemia, unspecified: Secondary | ICD-10-CM | POA: Diagnosis present

## 2018-10-08 DIAGNOSIS — Z7902 Long term (current) use of antithrombotics/antiplatelets: Secondary | ICD-10-CM

## 2018-10-08 DIAGNOSIS — Z6829 Body mass index (BMI) 29.0-29.9, adult: Secondary | ICD-10-CM

## 2018-10-08 DIAGNOSIS — R0602 Shortness of breath: Secondary | ICD-10-CM | POA: Diagnosis not present

## 2018-10-08 DIAGNOSIS — Z7982 Long term (current) use of aspirin: Secondary | ICD-10-CM | POA: Diagnosis not present

## 2018-10-08 DIAGNOSIS — I493 Ventricular premature depolarization: Secondary | ICD-10-CM | POA: Diagnosis present

## 2018-10-08 DIAGNOSIS — I63412 Cerebral infarction due to embolism of left middle cerebral artery: Principal | ICD-10-CM | POA: Diagnosis present

## 2018-10-08 DIAGNOSIS — Z7401 Bed confinement status: Secondary | ICD-10-CM

## 2018-10-08 DIAGNOSIS — I639 Cerebral infarction, unspecified: Secondary | ICD-10-CM | POA: Diagnosis present

## 2018-10-08 DIAGNOSIS — E669 Obesity, unspecified: Secondary | ICD-10-CM | POA: Diagnosis present

## 2018-10-08 DIAGNOSIS — I130 Hypertensive heart and chronic kidney disease with heart failure and stage 1 through stage 4 chronic kidney disease, or unspecified chronic kidney disease: Secondary | ICD-10-CM | POA: Diagnosis present

## 2018-10-08 DIAGNOSIS — F0280 Dementia in other diseases classified elsewhere without behavioral disturbance: Secondary | ICD-10-CM | POA: Diagnosis present

## 2018-10-08 DIAGNOSIS — N183 Chronic kidney disease, stage 3 (moderate): Secondary | ICD-10-CM | POA: Diagnosis present

## 2018-10-08 DIAGNOSIS — J988 Other specified respiratory disorders: Secondary | ICD-10-CM | POA: Diagnosis not present

## 2018-10-08 DIAGNOSIS — R402212 Coma scale, best verbal response, none, at arrival to emergency department: Secondary | ICD-10-CM | POA: Diagnosis present

## 2018-10-08 DIAGNOSIS — G8191 Hemiplegia, unspecified affecting right dominant side: Secondary | ICD-10-CM | POA: Diagnosis present

## 2018-10-08 DIAGNOSIS — E1122 Type 2 diabetes mellitus with diabetic chronic kidney disease: Secondary | ICD-10-CM | POA: Diagnosis present

## 2018-10-08 DIAGNOSIS — R1310 Dysphagia, unspecified: Secondary | ICD-10-CM | POA: Diagnosis present

## 2018-10-08 DIAGNOSIS — H518 Other specified disorders of binocular movement: Secondary | ICD-10-CM | POA: Diagnosis present

## 2018-10-08 DIAGNOSIS — R4701 Aphasia: Secondary | ICD-10-CM | POA: Diagnosis present

## 2018-10-08 DIAGNOSIS — I672 Cerebral atherosclerosis: Secondary | ICD-10-CM | POA: Diagnosis present

## 2018-10-08 DIAGNOSIS — I629 Nontraumatic intracranial hemorrhage, unspecified: Secondary | ICD-10-CM | POA: Diagnosis not present

## 2018-10-08 DIAGNOSIS — Z515 Encounter for palliative care: Secondary | ICD-10-CM

## 2018-10-08 DIAGNOSIS — Z833 Family history of diabetes mellitus: Secondary | ICD-10-CM

## 2018-10-08 DIAGNOSIS — I69354 Hemiplegia and hemiparesis following cerebral infarction affecting left non-dominant side: Secondary | ICD-10-CM | POA: Diagnosis not present

## 2018-10-08 DIAGNOSIS — I6300 Cerebral infarction due to thrombosis of unspecified precerebral artery: Secondary | ICD-10-CM | POA: Diagnosis not present

## 2018-10-08 DIAGNOSIS — R4182 Altered mental status, unspecified: Secondary | ICD-10-CM

## 2018-10-08 DIAGNOSIS — Z66 Do not resuscitate: Secondary | ICD-10-CM | POA: Diagnosis not present

## 2018-10-08 DIAGNOSIS — Z79899 Other long term (current) drug therapy: Secondary | ICD-10-CM

## 2018-10-08 DIAGNOSIS — M109 Gout, unspecified: Secondary | ICD-10-CM | POA: Diagnosis present

## 2018-10-08 DIAGNOSIS — I6523 Occlusion and stenosis of bilateral carotid arteries: Secondary | ICD-10-CM | POA: Diagnosis present

## 2018-10-08 DIAGNOSIS — J690 Pneumonitis due to inhalation of food and vomit: Secondary | ICD-10-CM | POA: Diagnosis not present

## 2018-10-08 DIAGNOSIS — I5020 Unspecified systolic (congestive) heart failure: Secondary | ICD-10-CM | POA: Diagnosis present

## 2018-10-08 DIAGNOSIS — G309 Alzheimer's disease, unspecified: Secondary | ICD-10-CM | POA: Diagnosis present

## 2018-10-08 DIAGNOSIS — R402112 Coma scale, eyes open, never, at arrival to emergency department: Secondary | ICD-10-CM | POA: Diagnosis present

## 2018-10-08 DIAGNOSIS — Z931 Gastrostomy status: Secondary | ICD-10-CM

## 2018-10-08 DIAGNOSIS — R29810 Facial weakness: Secondary | ICD-10-CM | POA: Diagnosis present

## 2018-10-08 DIAGNOSIS — Y92239 Unspecified place in hospital as the place of occurrence of the external cause: Secondary | ICD-10-CM | POA: Diagnosis not present

## 2018-10-08 DIAGNOSIS — J9601 Acute respiratory failure with hypoxia: Secondary | ICD-10-CM | POA: Diagnosis not present

## 2018-10-08 DIAGNOSIS — T45615A Adverse effect of thrombolytic drugs, initial encounter: Secondary | ICD-10-CM | POA: Diagnosis not present

## 2018-10-08 DIAGNOSIS — G9389 Other specified disorders of brain: Secondary | ICD-10-CM | POA: Diagnosis present

## 2018-10-08 DIAGNOSIS — R159 Full incontinence of feces: Secondary | ICD-10-CM | POA: Diagnosis present

## 2018-10-08 DIAGNOSIS — I447 Left bundle-branch block, unspecified: Secondary | ICD-10-CM | POA: Diagnosis present

## 2018-10-08 DIAGNOSIS — I4891 Unspecified atrial fibrillation: Secondary | ICD-10-CM | POA: Diagnosis not present

## 2018-10-08 DIAGNOSIS — D631 Anemia in chronic kidney disease: Secondary | ICD-10-CM | POA: Diagnosis present

## 2018-10-08 LAB — CBC
HCT: 35.9 % — ABNORMAL LOW (ref 39.0–52.0)
Hemoglobin: 11.8 g/dL — ABNORMAL LOW (ref 13.0–17.0)
MCH: 27.4 pg (ref 26.0–34.0)
MCHC: 32.9 g/dL (ref 30.0–36.0)
MCV: 83.3 fL (ref 80.0–100.0)
Platelets: 219 10*3/uL (ref 150–400)
RBC: 4.31 MIL/uL (ref 4.22–5.81)
RDW: 16.7 % — ABNORMAL HIGH (ref 11.5–15.5)
WBC: 7.9 10*3/uL (ref 4.0–10.5)
nRBC: 0 % (ref 0.0–0.2)

## 2018-10-08 LAB — BLOOD GAS, ARTERIAL
Acid-base deficit: 0.1 mmol/L (ref 0.0–2.0)
Bicarbonate: 24 mmol/L (ref 20.0–28.0)
Drawn by: 365271
FIO2: 100
MECHVT: 600 mL
O2 Saturation: 99.4 %
PEEP: 5 cmH2O
Patient temperature: 98.6
RATE: 16 resp/min
pCO2 arterial: 39.2 mmHg (ref 32.0–48.0)
pH, Arterial: 7.404 (ref 7.350–7.450)
pO2, Arterial: 477 mmHg — ABNORMAL HIGH (ref 83.0–108.0)

## 2018-10-08 LAB — COMPREHENSIVE METABOLIC PANEL
ALT: 13 U/L (ref 0–44)
AST: 17 U/L (ref 15–41)
Albumin: 3.3 g/dL — ABNORMAL LOW (ref 3.5–5.0)
Alkaline Phosphatase: 53 U/L (ref 38–126)
Anion gap: 12 (ref 5–15)
BUN: 24 mg/dL — ABNORMAL HIGH (ref 8–23)
CO2: 23 mmol/L (ref 22–32)
Calcium: 10 mg/dL (ref 8.9–10.3)
Chloride: 107 mmol/L (ref 98–111)
Creatinine, Ser: 1.45 mg/dL — ABNORMAL HIGH (ref 0.61–1.24)
GFR calc Af Amer: 53 mL/min — ABNORMAL LOW (ref >60)
GFR calc non Af Amer: 46 mL/min — ABNORMAL LOW (ref >60)
Glucose, Bld: 93 mg/dL (ref 70–99)
Potassium: 4.3 mmol/L (ref 3.5–5.1)
Sodium: 142 mmol/L (ref 135–145)
Total Bilirubin: 0.5 mg/dL (ref 0.3–1.2)
Total Protein: 7 g/dL (ref 6.5–8.1)

## 2018-10-08 LAB — DIFFERENTIAL
Abs Immature Granulocytes: 0.02 10*3/uL (ref 0.00–0.07)
Basophils Absolute: 0 10*3/uL (ref 0.0–0.1)
Basophils Relative: 0 %
Eosinophils Absolute: 0.2 10*3/uL (ref 0.0–0.5)
Eosinophils Relative: 3 %
Immature Granulocytes: 0 %
Lymphocytes Relative: 47 %
Lymphs Abs: 3.7 10*3/uL (ref 0.7–4.0)
Monocytes Absolute: 0.5 10*3/uL (ref 0.1–1.0)
Monocytes Relative: 6 %
Neutro Abs: 3.4 10*3/uL (ref 1.7–7.7)
Neutrophils Relative %: 44 %

## 2018-10-08 LAB — GLUCOSE, CAPILLARY
Glucose-Capillary: 78 mg/dL (ref 70–99)
Glucose-Capillary: 88 mg/dL (ref 70–99)
Glucose-Capillary: 102 mg/dL — ABNORMAL HIGH (ref 70–99)

## 2018-10-08 LAB — PHOSPHORUS: Phosphorus: 4.6 mg/dL (ref 2.5–4.6)

## 2018-10-08 LAB — MAGNESIUM: Magnesium: 1.8 mg/dL (ref 1.7–2.4)

## 2018-10-08 LAB — SARS CORONAVIRUS 2 BY RT PCR (HOSPITAL ORDER, PERFORMED IN ~~LOC~~ HOSPITAL LAB): SARS Coronavirus 2: NEGATIVE

## 2018-10-08 LAB — I-STAT CREATININE, ED: Creatinine, Ser: 1.4 mg/dL — ABNORMAL HIGH (ref 0.61–1.24)

## 2018-10-08 LAB — MRSA PCR SCREENING: MRSA by PCR: NEGATIVE

## 2018-10-08 LAB — CBG MONITORING, ED
Glucose-Capillary: 85 mg/dL (ref 70–99)
Glucose-Capillary: 94 mg/dL (ref 70–99)

## 2018-10-08 MED ORDER — CHLORHEXIDINE GLUCONATE 0.12% ORAL RINSE (MEDLINE KIT)
15.0000 mL | Freq: Two times a day (BID) | OROMUCOSAL | Status: DC
  Administered 2018-10-08 – 2018-10-13 (×8): 15 mL via OROMUCOSAL

## 2018-10-08 MED ORDER — METOPROLOL SUCCINATE ER 25 MG PO TB24
12.5000 mg | ORAL_TABLET | Freq: Every day | ORAL | Status: DC
  Administered 2018-10-09: 12.5 mg via ORAL
  Filled 2018-10-08 (×2): qty 1

## 2018-10-08 MED ORDER — ATORVASTATIN CALCIUM 40 MG PO TABS
40.0000 mg | ORAL_TABLET | Freq: Every day | ORAL | Status: DC
  Filled 2018-10-08: qty 1

## 2018-10-08 MED ORDER — ETOMIDATE 2 MG/ML IV SOLN
INTRAVENOUS | Status: AC | PRN
  Administered 2018-10-08: 20 mg via INTRAVENOUS

## 2018-10-08 MED ORDER — PROPOFOL 1000 MG/100ML IV EMUL
INTRAVENOUS | Status: AC
  Administered 2018-10-08: 5 ug/kg/min via INTRAVENOUS
  Filled 2018-10-08: qty 100

## 2018-10-08 MED ORDER — ATORVASTATIN CALCIUM 40 MG PO TABS
40.0000 mg | ORAL_TABLET | Freq: Every day | ORAL | Status: DC
  Administered 2018-10-08: 40 mg

## 2018-10-08 MED ORDER — ORAL CARE MOUTH RINSE
15.0000 mL | OROMUCOSAL | Status: DC
  Administered 2018-10-08 – 2018-10-13 (×52): 15 mL via OROMUCOSAL

## 2018-10-08 MED ORDER — ACETAMINOPHEN 650 MG RE SUPP: 650.0000 mg | RECTAL | Status: DC | PRN

## 2018-10-08 MED ORDER — SODIUM CHLORIDE 0.9 % IV BOLUS
500.0000 mL | Freq: Once | INTRAVENOUS | Status: AC
  Administered 2018-10-08: 500 mL via INTRAVENOUS

## 2018-10-08 MED ORDER — PROPOFOL 1000 MG/100ML IV EMUL
5.0000 ug/kg/min | INTRAVENOUS | Status: DC
  Administered 2018-10-08: 5 ug/kg/min via INTRAVENOUS
  Administered 2018-10-08: 15 ug/kg/min via INTRAVENOUS

## 2018-10-08 MED ORDER — LORAZEPAM 2 MG/ML IJ SOLN
INTRAMUSCULAR | Status: AC
  Administered 2018-10-08: 2 mg via INTRAVENOUS
  Filled 2018-10-08: qty 1

## 2018-10-08 MED ORDER — CLEVIDIPINE BUTYRATE 0.5 MG/ML IV EMUL: 0.0000 mg/h | INTRAVENOUS | Status: DC

## 2018-10-08 MED ORDER — SODIUM CHLORIDE 0.9% FLUSH: 3.0000 mL | Freq: Once | INTRAVENOUS | Status: DC

## 2018-10-08 MED ORDER — INSULIN ASPART 100 UNIT/ML ~~LOC~~ SOLN
0.0000 [IU] | SUBCUTANEOUS | Status: DC
  Administered 2018-10-09 (×2): 2 [IU] via SUBCUTANEOUS

## 2018-10-08 MED ORDER — SODIUM CHLORIDE 0.9 % IV SOLN
50.0000 mL | Freq: Once | INTRAVENOUS | Status: AC
  Administered 2018-10-08: 50 mL via INTRAVENOUS

## 2018-10-08 MED ORDER — ACETAMINOPHEN 325 MG PO TABS
650.0000 mg | ORAL_TABLET | ORAL | Status: DC | PRN
  Filled 2018-10-08: qty 2

## 2018-10-08 MED ORDER — LORAZEPAM 2 MG/ML IJ SOLN
2.0000 mg | Freq: Once | INTRAMUSCULAR | Status: AC
  Administered 2018-10-08: 2 mg via INTRAVENOUS

## 2018-10-08 MED ORDER — ACETAMINOPHEN 160 MG/5ML PO SOLN
650.0000 mg | ORAL | Status: DC | PRN
  Administered 2018-10-10: 650 mg

## 2018-10-08 MED ORDER — SODIUM CHLORIDE 0.9 % IV SOLN
INTRAVENOUS | Status: DC
  Administered 2018-10-08 – 2018-10-13 (×8): via INTRAVENOUS

## 2018-10-08 MED ORDER — ALTEPLASE (STROKE) FULL DOSE INFUSION
0.9000 mg/kg | Freq: Once | INTRAVENOUS | Status: AC
  Administered 2018-10-08: 79.5 mg via INTRAVENOUS
  Filled 2018-10-08: qty 100

## 2018-10-08 MED ORDER — VITAL AF 1.2 CAL PO LIQD
1000.0000 mL | ORAL | Status: DC
  Administered 2018-10-08 – 2018-10-14 (×4): 1000 mL
  Filled 2018-10-08 (×6): qty 1000

## 2018-10-08 MED ORDER — ALBUTEROL SULFATE (2.5 MG/3ML) 0.083% IN NEBU: 2.5000 mg | INHALATION_SOLUTION | Freq: Four times a day (QID) | RESPIRATORY_TRACT | Status: DC | PRN

## 2018-10-08 MED ORDER — VITAL HIGH PROTEIN PO LIQD: 1000.0000 mL | ORAL | Status: DC

## 2018-10-08 MED ORDER — LATANOPROST 0.005 % OP SOLN
1.0000 [drp] | Freq: Every day | OPHTHALMIC | Status: DC
  Administered 2018-10-08 – 2018-10-12 (×5): 1 [drp] via OPHTHALMIC
  Filled 2018-10-08: qty 2.5

## 2018-10-08 MED ORDER — STROKE: EARLY STAGES OF RECOVERY BOOK
Freq: Once | Status: AC
  Administered 2018-10-08: 17:00:00

## 2018-10-08 MED ORDER — SUCCINYLCHOLINE CHLORIDE 20 MG/ML IJ SOLN
INTRAMUSCULAR | Status: AC | PRN
  Administered 2018-10-08: 125 mg via INTRAVENOUS

## 2018-10-08 MED ORDER — SENNOSIDES-DOCUSATE SODIUM 8.6-50 MG PO TABS: 1.0000 | ORAL_TABLET | Freq: Every evening | ORAL | Status: DC | PRN

## 2018-10-08 MED ORDER — PANTOPRAZOLE SODIUM 40 MG IV SOLR
40.0000 mg | Freq: Every day | INTRAVENOUS | Status: DC
  Administered 2018-10-08 – 2018-10-10 (×3): 40 mg via INTRAVENOUS
  Filled 2018-10-08 (×3): qty 40

## 2018-10-08 MED ORDER — LABETALOL HCL 5 MG/ML IV SOLN: 20.0000 mg | Freq: Once | INTRAVENOUS | Status: DC

## 2018-10-08 MED ORDER — PRO-STAT SUGAR FREE PO LIQD
30.0000 mL | Freq: Two times a day (BID) | ORAL | Status: DC
  Administered 2018-10-08 – 2018-10-13 (×11): 30 mL
  Filled 2018-10-08 (×11): qty 30

## 2018-10-08 NOTE — Progress Notes (Signed)
SLP Cancellation Note  Patient Details Name: Noah Bush. MRN: [id number] DOB: [birth year]   Cancelled treatment:       Reason Eval/Treat Not Completed: Medical issues which prohibited therapy;Patient not medically ready(Pt currently intubated. SLP will follow up. )  Clairissa Valvano I. Vear Clock, MS, CCC-SLP Acute Rehabilitation Services Office number [phone] Pager [phone]  Scheryl Marten 10/28/2018, 4:43 PM

## 2018-10-08 NOTE — ED Triage Notes (Signed)
Pt from home via ems; LSN 11:00 this am; per ems, pt's son went to feed him this am, noticed R side flaccidity and eyes deviated left; hx stroke last year with residual L sided weakness; sats 81% on RA, pt placed on 15 L; pt normally able to perform adl's, non-ambulatory

## 2018-10-08 NOTE — Progress Notes (Signed)
Initial Nutrition Assessment  DOCUMENTATION CODES:   Not applicable  INTERVENTION:   Vital AF 1.2 @ 50 ml/hr 30 ml Prostat BID  Provides: 1640 kcal, 120 grams protein, and 973 ml free water. TF regimen and propofol at current rate providing 1851 total kcal/day   NUTRITION DIAGNOSIS:   Inadequate oral intake related to inability to eat as evidenced by NPO status.  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    Patient is currently intubated on ventilator support MV: 9 L/min Temp (24hrs), Avg:97.8 F (36.6 C), Min:97.8 F (36.6 C), Max:97.8 F (36.6 C)  Propofol: 8 ml/hr ml/hr provides: 211 kcal   Medications reviewed and include: cleviprex and propofol  Labs reviewed    NUTRITION - FOCUSED PHYSICAL EXAM:  Deferred  Diet Order:   Diet Order            Diet NPO time specified  Diet effective now              EDUCATION NEEDS:   No education needs have been identified at this time  Skin:  Skin Assessment: Reviewed RN Assessment  Last BM:  unknown  Height:   Ht Readings from Last 1 Encounters:  10/02/2018 5\' 11"  (1.803 m)    Weight:   Wt Readings from Last 1 Encounters:  10/05/2018 88.3 kg    Ideal Body Weight:  78.1 kg  BMI:  Body mass index is 27.15 kg/m.  Estimated Nutritional Needs:   Kcal:  1749  Protein:  110-130 grams  Fluid:  > 1.7 L/day   Kendell Bane RD, LDN, CNSC [phone] Pager [phone] After Hours Pager

## 2018-10-08 NOTE — Progress Notes (Signed)
Pt transported from ED 035 to 4N25 without incident.

## 2018-10-08 NOTE — ED Notes (Signed)
ED TO INPATIENT HANDOFF REPORT  ED Nurse Name and Phone #: [phone]  S Name/Age/Gender Noah Bush. 78 y.o. male Room/Bed: 035C/035C  Code Status   Code Status: Full Code  Home/SNF/Other Home  Triage Complete: Triage complete  Chief Complaint code stroke  Triage Note Pt from home via ems; LSN 11:00 this am; per ems, pt's son went to feed him this am, noticed R side flaccidity and eyes deviated left; hx stroke last year with residual L sided weakness; sats 81% on RA, pt placed on 15 L; pt normally able to perform adl's, non-ambulatory   Allergies No Known Allergies  Level of Care/Admitting Diagnosis ED Disposition    ED Disposition Condition Comment   Admit  Hospital Area: [facility] [[id number]]  Level of Care: ICU [6]  Covid Evaluation: Screening Protocol (No Symptoms)  Diagnosis: Stroke (cerebrum) [facility]) [[id number]]  Admitting Physician: Otelia Limes ERIC Valen.Docker  Attending Physician: Otelia Limes, ERIC Valen.Docker  Estimated length of stay: 5 - 7 days  Certification:: I certify this patient will need inpatient services for at least 2 midnights  PT Class (Do Not Modify): Inpatient [101]  PT Acc Code (Do Not Modify): Private [1]       B Medical/Surgery History Past Medical History:  Diagnosis Date  . Alzheimer's dementia (HCC)    [name] 07/23/2017  . Cerebral infarction (HCC) 07/27/2017  . Cerebrovascular accident (CVA) (HCC) 07/23/2017  . CKD (chronic kidney disease), stage III (HCC)    [name] 07/23/2017  . Diabetes mellitus without complication (HCC)   . Gout   . Hypertension    Past Surgical History:  Procedure Laterality Date  . COLONOSCOPY WITH PROPOFOL N/A 05/09/2015   Procedure: COLONOSCOPY WITH PROPOFOL;  Surgeon: [name], MD;  Location: [facility] ENDOSCOPY;  Service: Endoscopy;  Laterality: N/A;  . IR GASTROSTOMY TUBE MOD SED  08/13/2017  . TIBIA FRACTURE SURGERY Left      A IV Location/Drains/Wounds Patient Lines/Drains/Airways Status    Active Line/Drains/Airways    Name:   Placement date:   Placement time:   Site:   Days:   Peripheral IV Oct 16, 2018 Right Forearm   16-Oct-2018    1252    Forearm   less than 1   Biliary Tube 10 Fr. RLQ   12/27/17    1212    RLQ   285   Gastrostomy/Enterostomy Gastrostomy 20 Fr. LUQ   08/13/17    1458    LUQ   421   Airway 7.5 mm   10/16/18    1310     less than 1          Intake/Output Last 24 hours No intake or output data in the 24 hours ending 2018/10/16 1357  Labs/Imaging Results for orders placed or performed during the hospital encounter of 10-16-18 (from the past 48 hour(s))  CBG monitoring, ED     Status: None   Collection Time: Oct 16, 2018 12:38 PM  Result Value Ref Range   Glucose-Capillary 85 70 - 99 mg/dL  CBC     Status: Abnormal   Collection Time: Oct 16, 2018 12:42 PM  Result Value Ref Range   WBC 7.9 4.0 - 10.5 K/uL   RBC 4.31 4.22 - 5.81 MIL/uL   Hemoglobin 11.8 (L) 13.0 - 17.0 g/dL   HCT 78.2 (L) 95.6 - 21.3 %   MCV 83.3 80.0 - 100.0 fL   MCH 27.4 26.0 - 34.0 pg   MCHC 32.9 30.0 - 36.0 g/dL   RDW 08.6 (  H) 11.5 - 15.5 %   Platelets 219 150 - 400 K/uL    Comment: REPEATED TO VERIFY   nRBC 0.0 0.0 - 0.2 %    Comment: Performed at [facility], [address] [place], [place] [address]  Differential     Status: None   Collection Time: 10/19/2018 12:42 PM  Result Value Ref Range   Neutrophils Relative % 44 %   Neutro Abs 3.4 1.7 - 7.7 K/uL   Lymphocytes Relative 47 %   Lymphs Abs 3.7 0.7 - 4.0 K/uL   Monocytes Relative 6 %   Monocytes Absolute 0.5 0.1 - 1.0 K/uL   Eosinophils Relative 3 %   Eosinophils Absolute 0.2 0.0 - 0.5 K/uL   Basophils Relative 0 %   Basophils Absolute 0.0 0.0 - 0.1 K/uL   Immature Granulocytes 0 %   Abs Immature Granulocytes 0.02 0.00 - 0.07 K/uL    Comment: Performed at [facility], [address] [place], [place] [address]  Comprehensive metabolic panel     Status: Abnormal   Collection Time: 10/10/2018 12:42 PM  Result Value  Ref Range   Sodium 142 135 - 145 mmol/L   Potassium 4.3 3.5 - 5.1 mmol/L   Chloride 107 98 - 111 mmol/L   CO2 23 22 - 32 mmol/L   Glucose, Bld 93 70 - 99 mg/dL   BUN 24 (H) 8 - 23 mg/dL   Creatinine, Ser 6.57 (H) 0.61 - 1.24 mg/dL   Calcium 84.6 8.9 - 96.2 mg/dL   Total Protein 7.0 6.5 - 8.1 g/dL   Albumin 3.3 (L) 3.5 - 5.0 g/dL   AST 17 15 - 41 U/L   ALT 13 0 - 44 U/L   Alkaline Phosphatase 53 38 - 126 U/L   Total Bilirubin 0.5 0.3 - 1.2 mg/dL   GFR calc non Af Amer 46 (L) >60 mL/min   GFR calc Af Amer 53 (L) >60 mL/min   Anion gap 12 5 - 15    Comment: Performed at [facility], [address], [place], [place] [address]  I-stat Creatinine, ED     Status: Abnormal   Collection Time: 10/04/2018 12:45 PM  Result Value Ref Range   Creatinine, Ser 1.40 (H) 0.61 - 1.24 mg/dL  CBG monitoring, ED     Status: None   Collection Time: 10/16/2018 12:57 PM  Result Value Ref Range   Glucose-Capillary 94 70 - 99 mg/dL   Dg Chest Portable 1 View  Result Date: 10/10/2018 CLINICAL DATA:  Code stroke.  Unresponsive. EXAM: PORTABLE CHEST 1 VIEW COMPARISON:  07/23/2017 FINDINGS: Heart size is normal. There is chronic aortic atherosclerosis. Endotracheal tube is 9.5 cm above the carina just below the thoracic inlet. Consider advancing. Lungs appear clear. No pulmonary edema. IMPRESSION: Endotracheal tube slightly high, 9.5 cm above the carina. Lungs essentially clear. Electronically Signed   By: Paulina Fusi M.D.   On: 10/06/2018 13:51   Dg Abd Portable 1 View  Result Date: 10/09/2018 CLINICAL DATA:  Found unresponsive.  Peg placement. EXAM: PORTABLE ABDOMEN - 1 VIEW COMPARISON:  03/08/2018 FINDINGS: Peg tube is seen projecting over the expected location of the stomach. No sign of free air. No evidence of bowel obstruction. Moderate amount of fecal matter in the colon. IMPRESSION: Peg tube in place. Moderate amount of fecal matter. No acute finding. Electronically Signed   By: Paulina Fusi M.D.   On:  10/16/2018 13:52   Ct Head  Code Stroke Wo Contrast  Result Date: 10/31/2018 CLINICAL DATA:  Code stroke. Last seen normal 1100 hours. Right-sided weakness. Left gaze. EXAM: CT HEAD WITHOUT CONTRAST TECHNIQUE: Contiguous axial images were obtained from the base of the skull through the vertex without intravenous contrast. COMPARISON:  08/12/2017 FINDINGS: Brain: Old infarction in the right MCA territory has progressed to encephalomalacia and gliosis. The remainder the brain shows age related atrophy without evidence of acute infarction, mass lesion, hemorrhage, hydrocephalus or extra-axial collection. Vascular: There is atherosclerotic calcification of the major vessels at the base of the brain. Skull: Negative Sinuses/Orbits: Clear/normal Other: None ASPECTS (Alberta Stroke Program Early CT Score) - Ganglionic level infarction (caudate, lentiform nuclei, internal capsule, insula, M1-M3 cortex): 7 - Supraganglionic infarction (M4-M6 cortex): 3 Total score (0-10 with 10 being normal): 10 IMPRESSION: 1. No acute left brain insult identified. Old right MCA territory stroke progressed to encephalomalacia. 2. ASPECTS is 10. 3. These results were communicated to Dr. Otelia Limes at 12:55 pmon 05/07/2020by text page via the [facility] messaging system. Electronically Signed   By: Paulina Fusi M.D.   On: 10/10/2018 12:56    Pending Labs Unresulted Labs (From admission, onward)    Start     Ordered   10/09/18 0500  Hemoglobin A1c  Tomorrow morning,   R     10/02/2018 1344   10/09/18 0500  Lipid panel  Tomorrow morning,   R    Comments:  Fasting    10/17/2018 1344   10/11/2018 1400  Blood gas, arterial  Once,   R     10/02/2018 1329   10/28/2018 1317  SARS Coronavirus 2 (CEPHEID- Performed in [facility] hospital lab), Hosp Order  (Symptomatic Patients Labs with Precautions )  Once,   R    Question:  Patient immune status  Answer:  Normal   10/02/2018 1316          Vitals/Pain Today's Vitals   10/04/2018 1330 10/03/2018 1335  10/23/2018 1345 10/24/2018 1350  BP: 125/82 128/75 117/80 114/83  Pulse: 81 84 78 78  Resp: 17 15 12 12   Temp:      TempSrc:      SpO2: 100% 100% 100% 100%  Weight:      Height:        Isolation Precautions Droplet and Contact precautions  Medications Medications  sodium chloride flush (NS) 0.9 % injection 3 mL (has no administration in time range)  alteplase (ACTIVASE) 1 mg/mL infusion 79.5 mg (79.5 mg Intravenous New Bag/Given 10/03/2018 1304)    Followed by  0.9 %  sodium chloride infusion (has no administration in time range)  propofol (DIPRIVAN) 1000 MG/100ML infusion (has no administration in time range)  propofol (DIPRIVAN) 1000 MG/100ML infusion (25 mcg/kg/min  88.3 kg Intravenous Rate/Dose Change 10/25/2018 1352)   stroke: mapping our early stages of recovery book (has no administration in time range)  0.9 %  sodium chloride infusion (has no administration in time range)  acetaminophen (TYLENOL) tablet 650 mg (has no administration in time range)    Or  acetaminophen (TYLENOL) solution 650 mg (has no administration in time range)    Or  acetaminophen (TYLENOL) suppository 650 mg (has no administration in time range)  senna-docusate (Senokot-S) tablet 1 tablet (has no administration in time range)  pantoprazole (PROTONIX) injection 40 mg (has no administration in time range)  labetalol (NORMODYNE) injection 20 mg (has no administration in time range)    And  clevidipine (CLEVIPREX) infusion 0.5 mg/mL (has no administration in  time range)  albuterol (PROVENTIL) (2.5 MG/3ML) 0.083% nebulizer solution 2.5 mg (has no administration in time range)  atorvastatin (LIPITOR) tablet 40 mg (has no administration in time range)  latanoprost (XALATAN) 0.005 % ophthalmic solution 1 drop (has no administration in time range)  metoprolol succinate (TOPROL-XL) 24 hr tablet 12.5 mg (has no administration in time range)  insulin aspart (novoLOG) injection 0-15 Units (has no administration in time  range)  LORazepam (ATIVAN) injection 2 mg (2 mg Intravenous Given 10/31/2018 1253)  etomidate (AMIDATE) injection (2,649 mg Intravenous Given 10/14/2018 1309)  succinylcholine (ANECTINE) injection (125 mg Intravenous Given 10/21/2018 1310)    Mobility non-ambulatory     Focused Assessments Neuro Assessment Handoff:  Swallow screen pass? No    NIH Stroke Scale ( + Modified Stroke Scale Criteria)  Interval: (tPA bolus given) Level of Consciousness (1a.)   : Responds only with reflex motor or autonomic effects or totally unresponsive, flaccid, and areflexic LOC Questions (1b. )   +: Answers neither question correctly LOC Commands (1c. )   + : Performs neither task correctly Best Gaze (2. )  +: Forced deviation Visual (3. )  +: Complete hemianopia Facial Palsy (4. )    : Partial paralysis  Motor Arm, Left (5a. )   +: No movement Motor Arm, Right (5b. )   +: No movement Motor Leg, Left (6a. )   +: No movement Motor Leg, Right (6b. )   +: No movement Limb Ataxia (7. ): Absent Sensory (8. )   +: Normal, no sensory loss Best Language (9. )   +: Severe aphasia Dysarthria (10. ): Intubated or other physical barrier Extinction/Inattention (11.)   +: Profound hemi-inattention or extinction to more than one modality Modified SS Total  +: 28 Complete NIHSS TOTAL: 33 Last date known well: 06/13/18 Last time known well: 1105 Neuro Assessment: Exceptions to WDL Neuro Checks:   Initial (06/13/18 1250)  Last Documented NIHSS Modified Score: 28 (06/13/18 1345) Has TPA been given? Yes Temp: 97.8 F (36.6 C) (05/08 1322) Temp Source: Axillary (05/08 1322) BP: 114/83 (05/08 1350) Pulse Rate: 78 (05/08 1350) If patient is a Neuro Trauma and patient is going to OR before floor call report to 4N Charge nurse: [phone] or [phone]     R Recommendations: See Admitting Provider Note  Report given to:   Additional Notes: wife : Annie [phone]

## 2018-10-08 NOTE — Progress Notes (Signed)
ET tube advanced, per order, 2 cm.  Now located @ 25 cm, measured from lip.

## 2018-10-08 NOTE — H&P (Signed)
Admission H&P    Chief Complaint: Acute onset of right hemiplegia and unresponsiveness  HPI: Bobby RumpfJames Ketcham Jr. is an 78 y.o. male with a past history of stroke with residual left hemiparesis who acutely developed right sided weakness and unresponsiveness at home today. LKN was 1105. On EMS arrival to his residence, he was plegic on the right with leftward eye deviation. He is on Plavix and ASA at home. Not on a blood thinner.   The patient's PMHx includes Alzheimer dementia, prior stroke, CKD, DM and HTN.  CT head shows no acute hemorrhage or left sided hypodensity. A large chronic right MCA territory ischemic infarction, which has progressed to encephalomalacia, is noted. ASPECTS is 10.  LSN: [id number] tPA Given: Yes   Past Medical History:  Diagnosis Date  . Alzheimer's dementia (HCC)    [name]/notes 07/23/2017  . Cerebral infarction (HCC) 07/27/2017  . Cerebrovascular accident (CVA) (HCC) 07/23/2017  . CKD (chronic kidney disease), stage III (HCC)    [name]/notes 07/23/2017  . Diabetes mellitus without complication (HCC)   . Gout   . Hypertension     Past Surgical History:  Procedure Laterality Date  . COLONOSCOPY WITH PROPOFOL N/A 05/09/2015   Procedure: COLONOSCOPY WITH PROPOFOL;  Surgeon: Earline MayotteJeffrey W Byrnett, MD;  Location: [facility] ENDOSCOPY;  Service: Endoscopy;  Laterality: N/A;  . IR GASTROSTOMY TUBE MOD SED  08/13/2017  . TIBIA FRACTURE SURGERY Left     Family History  Problem Relation Age of Onset  . Diabetes Paternal Grandfather    Social History:  reports that he has never smoked. He has never used smokeless tobacco. He reports current alcohol use of about 2.0 standard drinks of alcohol per week. He reports that he does not use drugs.  Allergies: No Known Allergies  (Not in a hospital admission)   ROS: Unable to obtain due to verbal unresponsiveness.  Physical Examination: Blood pressure 123/72, pulse (!) 115, resp. rate 16, weight 88.3 kg, SpO2 100 %.  HEENT-  [place]/AT  Lungs -  Respirations unlabored Extremities - Warm and well perfused. Severe onychomycosis to toenails is noted.   Neurologic Examination: Mental Status: Obtunded with eyes open. Not following commands. Non-verbal. No attempts to communicate. No purposeful movements.  Cranial Nerves: II:  No blink to threat on left or right Pupils round, 2 mm and equal. III,IV, VI: Eyes deviated tonically to the left. Oculocephalic reflexes intact towards the right but will not cross midline. No nystagmus.  V: No significant responses to tactile stimulation VII: Right facial droop. Left side of face with somewhat increased tone and contractoin of muscles of left lower quadrant, but no twitching noted.  VIII: No response to verbal IX,X: Unable to visualize palate XI: Head tonically rotated to left.  XII: Unable to assess for tongue extension.  Motor/Sensory: RUE: Flaccid tone with no movement to noxious. RLE: Increased extensor tone, with slight withdrawal to noxious LUE: Increased extensor tone; will adduct at shoulder and flex at elbow weakly with 2/5 strength to pinch.  LLE: Increased extensor tone; will weakly flex at hip, knee and ankle to noxious plantar stimulation.  Not following any commands for limb movement.  Deep Tendon Reflexes:  Low-amplitude, pathologically brisk reflexes in upper extremities. Trace pathologically brisk right patellar reflex. Unable to elicit left patellar in context of increased tone.  Plantars: Upgoing bilaterally  Cerebellar/Gait: Unable to assess   Results for orders placed or performed during the hospital encounter of 03/08/2019 (from the past 48 hour(s))  CBG monitoring, ED  Status: None   Collection Time: 10/17/2018 12:38 PM  Result Value Ref Range   Glucose-Capillary 85 70 - 99 mg/dL  CBC     Status: Abnormal   Collection Time: 10/12/2018 12:42 PM  Result Value Ref Range   WBC 7.9 4.0 - 10.5 K/uL   RBC 4.31 4.22 - 5.81 MIL/uL   Hemoglobin 11.8 (L) 13.0 - 17.0 g/dL   HCT  16.1 (L) 09.6 - 52.0 %   MCV 83.3 80.0 - 100.0 fL   MCH 27.4 26.0 - 34.0 pg   MCHC 32.9 30.0 - 36.0 g/dL   RDW 04.5 (H) 40.9 - 81.1 %   Platelets 219 150 - 400 K/uL    Comment: REPEATED TO VERIFY   nRBC 0.0 0.0 - 0.2 %    Comment: Performed at [facility], [address], [place], [place] [address]  Differential     Status: None   Collection Time: 10/07/2018 12:42 PM  Result Value Ref Range   Neutrophils Relative % 44 %   Neutro Abs 3.4 1.7 - 7.7 K/uL   Lymphocytes Relative 47 %   Lymphs Abs 3.7 0.7 - 4.0 K/uL   Monocytes Relative 6 %   Monocytes Absolute 0.5 0.1 - 1.0 K/uL   Eosinophils Relative 3 %   Eosinophils Absolute 0.2 0.0 - 0.5 K/uL   Basophils Relative 0 %   Basophils Absolute 0.0 0.0 - 0.1 K/uL   Immature Granulocytes 0 %   Abs Immature Granulocytes 0.02 0.00 - 0.07 K/uL    Comment: Performed at [facility], [address], [place], [place] [address]  I-stat Creatinine, ED     Status: Abnormal   Collection Time: 10/01/2018 12:45 PM  Result Value Ref Range   Creatinine, Ser 1.40 (H) 0.61 - 1.24 mg/dL  CBG monitoring, ED     Status: None   Collection Time: 10/10/2018 12:57 PM  Result Value Ref Range   Glucose-Capillary 94 70 - 99 mg/dL   Ct Head Code Stroke Wo Contrast  Result Date: 10/25/2018 CLINICAL DATA:  Code stroke. Last seen normal 1100 hours. Right-sided weakness. Left gaze. EXAM: CT HEAD WITHOUT CONTRAST TECHNIQUE: Contiguous axial images were obtained from the base of the skull through the vertex without intravenous contrast. COMPARISON:  08/12/2017 FINDINGS: Brain: Old infarction in the right MCA territory has progressed to encephalomalacia and gliosis. The remainder the brain shows age related atrophy without evidence of acute infarction, mass lesion, hemorrhage, hydrocephalus or extra-axial collection. Vascular: There is atherosclerotic calcification of the major vessels at the base of the brain. Skull: Negative Sinuses/Orbits: Clear/normal Other: None  ASPECTS (Alberta Stroke Program Early CT Score) - Ganglionic level infarction (caudate, lentiform nuclei, internal capsule, insula, M1-M3 cortex): 7 - Supraganglionic infarction (M4-M6 cortex): 3 Total score (0-10 with 10 being normal): 10 IMPRESSION: 1. No acute left brain insult identified. Old right MCA territory stroke progressed to encephalomalacia. 2. ASPECTS is 10. 3. These results were communicated to Dr. Otelia Limes at 12:55 pmon 05/18/2020by text page via the [facility] messaging system. Electronically Signed   By: Paulina Fusi M.D.   On: 10/21/2018 12:56    Assessment: 78 y.o. male presenting with history of large right fronto-parietal ischemic stroke, presenting with acute onset of right hemiplegia and tonic leftward eye deviation.  1. Presentation most consistent with acute left MCA territory stroke. No jerking or twitching to suggest seizure. If tonic eye deviation was due to seizure, then right side would not be expected to be  weak, as it is on current exam. Additionally, no improvement with 2 mg IV Ativan.  2. CT head shows no acute hemorrhage or left sided hypodensity. A large chronic right MCA territory ischemic infarction, which has progressed to encephalomalacia, is noted. ASPECTS is 10. 3. Dr. Lockie Mola and I spoke with the patient's wife over the telephone on speaker phone mode. She wants him to be a full code and for everything to be done for him medically. He has no contraindications to tPA based on discussion with his wife. Risks/benefits of tPA were discussed with his wife; I expressed my medical opinion to the patient's wife that tPA is indicated; she would like to proceed with IV tPA.  4. The patient has Alzheimer dementia and is dependent on others for care. He is therefore not an endovascular candidate per guidelines.  5. Stroke Risk Factors - prior stroke, DM and HTN 6. Has been intubated for airway protection.   Plan: 1. Administering IV tPA.  2. Admit to the ICU under the Neurology  service 3. Post-tPA orders to include BP management and frequent neuro checks 4. MRI, MRA of the brain without contrast 5. CCM consult for ventilator management 6. SSI 7. IVF 8.  PT consult, OT consult, Speech consult 9. Echocardiogram 10. Carotid dopplers 11. Telemetry monitoring 12. HgbA1c, fasting lipid panel 13. No antiplatelet medications or anticoagulants for at least 24 hours following tPA. Can consider if repeat CT head at 24 hours is negative for hemorrage 14. DVT prophylaxis with SCDs  60 minutes spent in the acute neurological evaluation and management of this critically ill acute stroke patient Electronically signed: Dr. Caryl Pina 10-12-18, 1:07 PM

## 2018-10-08 NOTE — Consult Note (Addendum)
NAME:  Noah RumpfJames Landowski Jr., MRN:  [id number], DOB:  1941/01/21, LOS: 0 ADMISSION DATE:  10/31/2018, CONSULTATION DATE:  10/06/2018 REFERRING MD: Dr. Otelia LimesLindzen, CHIEF COMPLAINT:  AMS   Brief History   78 y/o M who [facility] with right sided weakness, LSN 1105.  CT head without acute hemorrhage or left sided hypodensity.  tPA administered. Intubated for AMS/poor airway protection.  History of present illness   Patient is altered on mechanical ventilation.  Unable to obtain HPI.  Information obtained from staff at bedside and chart review.   78 y/o M who [facility] with reports of acute onset right sided weakness.  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  He was last seen at his baseline at 1105 am.  He reportedly has been seen by Palliative Care at home and was made a DNR.    EMS found him altered and hypoxic with saturations of 81%, placed on NRB. On arrival to the ER, he was noted to have right hemiplegia, leftward gaze, not speaking, given tPA.   CODE STROKE activated and patient's CT showed no acute hemorrhage or left sided hypodensity. Old right MCA territory ischemic infarct has progressed to encephalomalacia.  Initial labs- Na 142, K 4.3, Cl 107, CO2 23, glucose 93, BUN 24, sr cr 1.45, WBC 7.9, Hgb 11.8 and platelets 219.  CXR was negative. The patient was intubated for airway protection.  COVID testing negative.  PCCM called for ICU admission.    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  On plavix / ASA at baseline.  Alzheimer's Dementia  CKD  DM  HTN  UTI with prior E-Coli ACD  Significant Hospital Events   5/08 Admit   Consults:  PCCM  Procedures:  ETT 5/8 >>   Significant Diagnostic Tests:  CT Head 5/8 >> no  acute left brain abnormality, old right MCA territory CVA that progressed to encephalomalacia  Micro Data:  COVID 5/8 >>   Antimicrobials:    Interim history/subjective:  As above   Objective   Blood pressure 128/75, pulse 84, temperature 97.8 F (36.6 C), temperature source Axillary, resp. rate 15, height 5\' 11"  (1.803 m), weight 88.3 kg, SpO2 100 %.    Vent Mode: PRVC FiO2 (%):  [100 %] 100 % Set Rate:  [16 bmp] 16 bmp Vt Set:  [600 mL] 600 mL PEEP:  [5 cmH20] 5 cmH20 Plateau Pressure:  [14 cmH20] 14 cmH20  No intake or output data in the 24 hours ending 2018/06/08 1342 Filed Weights   2018/06/08 1252  Weight: 88.3 kg    Examination: General: chronically ill appearing elderly male lying in bed on vent, NAD HEENT: MM pink/moist, ETT Neuro: on propofol, + gag, pupils 2mm =/reactive, spontaneous movement of LUE noted, RLE withdrawal with stimulation CV: s1s2 rrr, no m/r/g PULM: even/non-labored, lungs bilaterally clear  UU:VOZDGI:soft, non-tender, bsx4 active  Extremities: warm/dry, no edema  Skin: no rashes or lesions  Resolved Hospital Problem list      Assessment & Plan:   Concern for L MCA CVA  -CT head w/o acute hemorrhage, old right MCA territory infarct  -s/p tPA administration 5/8 Hx of Large R MCA CVA P: Further CVA work up per Neurology  Repeat neuro  imaging per Stroke service  Follow serial neuro exams  Monitor for bleeding post tPA Likely poor prognosis given overall state of health and new CVA  Assess ECHO, carotid doppler, HgbA1c  Acute Respiratory Insufficiency  -in setting of CVA P: PRVC 8cc/kg, rate 16 Wean PEEP / FiO2 for sats > 90% Advance ETT by 2 cm  Follow CXR intermittently, reassess in am for evolution of possible aspiration (no infiltrate on initial film but pt may be volume deplete on presentation) Will need to clarify with family regarding goals of care before extubation  HTN P: Continue lopressor   DM  P: SSI Q4  At Risk  Malnutrition  P: Begin TF this evening   Best practice:  Diet: NPO  Pain/Anxiety/Delirium protocol (if indicated): propofol  VAP protocol (if indicated): in place  DVT prophylaxis: SCD's, s/p tPA 5/8 GI prophylaxis: PPI Glucose control:  Mobility: bed rest  Code Status: Full Code  Family Communication: Wife updated per EDP.   Disposition: ICU  Labs   CBC: Recent Labs  Lab 31-Oct-2018 1242  WBC 7.9  NEUTROABS 3.4  HGB 11.8*  HCT 35.9*  MCV 83.3  PLT 219    Basic Metabolic Panel: Recent Labs  Lab 10/31/2018 1242 2018-10-31 1245  NA 142  --   K 4.3  --   CL 107  --   CO2 23  --   GLUCOSE 93  --   BUN 24*  --   CREATININE 1.45* 1.40*  CALCIUM 10.0  --    GFR: Estimated Creatinine Clearance: 47.1 mL/min (A) (by C-G formula based on SCr of 1.4 mg/dL (H)). Recent Labs  Lab 10-31-2018 1242  WBC 7.9    Liver Function Tests: Recent Labs  Lab 2018-10-31 1242  AST 17  ALT 13  ALKPHOS 53  BILITOT 0.5  PROT 7.0  ALBUMIN 3.3*   No results for input(s): LIPASE, AMYLASE in the last 168 hours. No results for input(s): AMMONIA in the last 168 hours.  ABG    Component Value Date/Time   TCO2 20 (L) 07/23/2017 1334     Coagulation Profile: No results for input(s): INR, PROTIME in the last 168 hours.  Cardiac Enzymes: No results for input(s): CKTOTAL, CKMB, CKMBINDEX, TROPONINI in the last 168 hours.  HbA1C: Hemoglobin A1C  Date/Time Value Ref Range Status  03/06/2013 01:33 AM 6.6 (H) 4.2 - 6.3 % Final    Comment:    The American Diabetes Association recommends that a primary goal of therapy should be <7% and that physicians should reevaluate the treatment regimen in patients with HbA1c values consistently >8%.   01/16/2013 04:39 AM 7.7 (H) 4.2 - 6.3 % Final    Comment:    The American Diabetes Association recommends that a primary goal of therapy should be <7% and that physicians should reevaluate the treatment regimen in patients with HbA1c values  consistently >8%.    Hgb A1c MFr Bld  Date/Time Value Ref Range Status  07/24/2017 05:46 AM 6.9 (H) 4.8 - 5.6 % Final    Comment:    (NOTE) Pre diabetes:          5.7%-6.4% Diabetes:              >6.4% Glycemic control for   <7.0% adults with diabetes     CBG: Recent Labs  Lab Oct 31, 2018 1238 10/31/18 1257  GLUCAP 85 94    Review of Systems:   Unable to complete as patient is altered on mechanical ventilation.  Past Medical History  He,  has a past medical history of Alzheimer's dementia (HCC), Cerebral infarction (HCC) (07/27/2017), Cerebrovascular accident (CVA) (HCC) (07/23/2017), CKD (chronic kidney disease), stage III (HCC), Diabetes mellitus without complication (HCC), Gout, and Hypertension.   Surgical History    Past Surgical History:  Procedure Laterality Date  . COLONOSCOPY WITH PROPOFOL N/A 05/09/2015   Procedure: COLONOSCOPY WITH PROPOFOL;  Surgeon: [name], MD;  Location: [facility] ENDOSCOPY;  Service: Endoscopy;  Laterality: N/A;  . IR GASTROSTOMY TUBE MOD SED  08/13/2017  . TIBIA FRACTURE SURGERY Left      Social History   reports that he has never smoked. He has never used smokeless tobacco. He reports current alcohol use of about 2.0 standard drinks of alcohol per week. He reports that he does not use drugs.   Family History   His family history includes Diabetes in his paternal grandfather.   Allergies No Known Allergies   Home Medications  Prior to Admission medications   Medication Sig Start Date End Date Taking? Authorizing Provider  acetaminophen (TYLENOL) 325 MG tablet Take 1-2 tablets (325-650 mg total) by mouth every 4 (four) hours as needed for mild pain. 08/24/17   Love, Evlyn Kanner, PA-C  albuterol (PROVENTIL) (2.5 MG/3ML) 0.083% nebulizer solution Take 3 mLs (2.5 mg total) by nebulization every 6 (six) hours as needed for wheezing or shortness of breath. 08/24/17   Love, Evlyn Kanner, PA-C  Amino Acids-Protein Hydrolys (FEEDING SUPPLEMENT,  PRO-STAT SUGAR FREE 64,) LIQD Place 30 mLs into feeding tube 3 (three) times daily between meals. 08/24/17   Love, Evlyn Kanner, PA-C  aspirin EC 81 MG tablet Take 81 mg by mouth daily.    [provider]  atorvastatin (LIPITOR) 80 MG tablet Take 1 tablet (80 mg total) by mouth daily at 6 PM. Patient taking differently: Take 40 mg by mouth daily at 6 PM.  07/28/17   Garnette Gunner, MD  clopidogrel (PLAVIX) 75 MG tablet Take 1 tablet (75 mg total) by mouth daily. 08/25/17   Love, Evlyn Kanner, PA-C  latanoprost (XALATAN) 0.005 % ophthalmic solution Place 1 drop into the left eye at bedtime. 08/24/17   Love, Evlyn Kanner, PA-C  memantine (NAMENDA) 5 MG tablet Take 5 mg by mouth daily.    [provider]  Menthol-Methyl Salicylate (MUSCLE RUB) 10-15 % CREA Apply 1 application topically 4 (four) times daily. To bilateral hands and bilateral knees 08/24/17   Love, Evlyn Kanner, PA-C  methylphenidate (RITALIN) 5 MG tablet Take 1 tablet (5 mg total) by mouth 2 (two) times daily with breakfast and lunch. 08/24/17   Love, Evlyn Kanner, PA-C  metoprolol succinate (TOPROL-XL) 25 MG 24 hr tablet Take 0.5 tablets (12.5 mg total) by mouth daily. 07/29/17   Garnette Gunner, MD  multivitamin (RENA-VIT) TABS tablet Take 1 tablet by mouth at bedtime. 08/24/17   Jacquelynn Cree, PA-C  NON FORMULARY  08/31/09   [provider]  Nutritional Supplements (FEEDING SUPPLEMENT, OSMOLITE 1.5 CAL,) LIQD Place 1,000 mLs into feeding tube daily. 01/03/18   Houston Siren, MD  Water For Irrigation, Sterile (FREE WATER) SOLN Place 150 mLs into feeding tube 4 (four) times daily. 01/03/18   Houston Siren, MD     Critical care time: 35 minutes     Canary Brim, NP-C [facility] Pgr: [phone] or if no answer [phone] November 01, 2018, 1:42 PM

## 2018-10-08 NOTE — ED Provider Notes (Signed)
[facility] EMERGENCY DEPARTMENT Provider Note   CSN: [id number] Arrival date & time: 10/04/2018  1234    History   Chief Complaint Chief Complaint  Patient presents with  . Code Stroke    HPI Noah Bush. is a 78 y.o. male.     Level 5 caveat due to altered mental status.  Patient code stroke upon arrival.  The history is provided by the EMS personnel and a caregiver.  Neurologic Problem  This is a new problem. The current episode started 1 to 2 hours ago. The problem occurs constantly. The problem has not changed since onset.Associated symptoms comments: New right sided weakness with aphasia and left gaze, hx of stroke with left weakness. Nothing aggravates the symptoms. Nothing relieves the symptoms. He has tried nothing for the symptoms. The treatment provided no relief.    Past Medical History:  Diagnosis Date  . Alzheimer's dementia (HCC)    [name] 07/23/2017  . Cerebral infarction (HCC) 07/27/2017  . Cerebrovascular accident (CVA) (HCC) 07/23/2017  . CKD (chronic kidney disease), stage III (HCC)    [name] 07/23/2017  . Diabetes mellitus without complication (HCC)   . Gout   . Hypertension     Patient Active Problem List   Diagnosis Date Noted  . Stroke (cerebrum) (HCC) 10/14/2018  . Debility 09/21/2018  . Anorexia 09/21/2018  . Decreased appetite 08/20/2018  . General body deterioration 08/20/2018  . Palliative care encounter 08/20/2018  . Advanced care planning/counseling discussion   . Goals of care, counseling/discussion   . Severe sepsis (HCC) 12/23/2017  . AKI (acute kidney injury) (HCC) 12/23/2017  . UTI (urinary tract infection) 12/23/2017  . Acute cholecystitis 12/23/2017  . Cough   . Hypotension due to drugs   . Finger pain, right   . Poor nutrition   . Acute blood loss anemia   . 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 Acute ischemic right MCA stroke (HCC) 07/28/2017  . Diabetes mellitus type 2 in obese  (HCC)   . Stage 3 chronic kidney disease (HCC)   . Morbid obesity (HCC)   . Cerebral infarction (HCC) 07/27/2017  . Hyperlipidemia   . Essential hypertension   . 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 Asymmetry of cerebral ventricles, Acute, Acquired  07/23/2017  . Encounter for screening colonoscopy 03/12/2015    Past Surgical History:  Procedure Laterality Date  . COLONOSCOPY WITH PROPOFOL N/A 05/09/2015   Procedure: COLONOSCOPY WITH PROPOFOL;  Surgeon: [name], MD;  Location: [facility] ENDOSCOPY;  Service: Endoscopy;  Laterality: N/A;  . IR GASTROSTOMY TUBE MOD SED  08/13/2017  . TIBIA FRACTURE SURGERY Left         Home Medications    Prior to Admission medications   Medication Sig Start Date End Date Taking? Authorizing Provider  acetaminophen (TYLENOL) 325 MG tablet Take 1-2 tablets (325-650 mg total) by mouth every 4 (four) hours as needed for mild pain. 08/24/17   Love, Evlyn Kanner, PA-C  albuterol (PROVENTIL) (2.5 MG/3ML) 0.083% nebulizer solution Take 3 mLs (2.5 mg total) by nebulization every 6 (six) hours as needed for wheezing or shortness of breath. 08/24/17   Love, Evlyn Kanner, PA-C  Amino Acids-Protein Hydrolys (FEEDING SUPPLEMENT, PRO-STAT SUGAR FREE 64,) LIQD Place 30 mLs into feeding tube 3 (three) times daily between meals. 08/24/17   Love, Evlyn Kanner, PA-C  aspirin EC 81 MG tablet Take 81 mg by mouth daily.    [provider]  atorvastatin (  LIPITOR) 80 MG tablet Take 1 tablet (80 mg total) by mouth daily at 6 PM. Patient taking differently: Take 40 mg by mouth daily at 6 PM.  07/28/17   Garnette Gunner, MD  clopidogrel (PLAVIX) 75 MG tablet Take 1 tablet (75 mg total) by mouth daily. 08/25/17   Love, Evlyn Kanner, PA-C  latanoprost (XALATAN) 0.005 % ophthalmic solution Place 1 drop into the left eye at bedtime. 08/24/17   Love, Evlyn Kanner, PA-C  memantine (NAMENDA) 5 MG tablet Take 5 mg by mouth daily.    [provider]  Menthol-Methyl  Salicylate (MUSCLE RUB) 10-15 % CREA Apply 1 application topically 4 (four) times daily. To bilateral hands and bilateral knees 08/24/17   Love, Evlyn Kanner, PA-C  methylphenidate (RITALIN) 5 MG tablet Take 1 tablet (5 mg total) by mouth 2 (two) times daily with breakfast and lunch. 08/24/17   Love, Evlyn Kanner, PA-C  metoprolol succinate (TOPROL-XL) 25 MG 24 hr tablet Take 0.5 tablets (12.5 mg total) by mouth daily. 07/29/17   Garnette Gunner, MD  multivitamin (RENA-VIT) TABS tablet Take 1 tablet by mouth at bedtime. 08/24/17   Jacquelynn Cree, PA-C  NON FORMULARY  08/31/09   [provider]  Nutritional Supplements (FEEDING SUPPLEMENT, OSMOLITE 1.5 CAL,) LIQD Place 1,000 mLs into feeding tube daily. 01/03/18   Houston Siren, MD  Water For Irrigation, Sterile (FREE WATER) SOLN Place 150 mLs into feeding tube 4 (four) times daily. 01/03/18   Houston Siren, MD    Family History Family History  Problem Relation Age of Onset  . Diabetes Paternal Grandfather     Social History Social History   Tobacco Use  . Smoking status: Never Smoker  . Smokeless tobacco: Never Used  Substance Use Topics  . Alcohol use: Yes    Alcohol/week: 2.0 standard drinks    Types: 2 Cans of beer per week  . Drug use: No     Allergies   Patient has no known allergies.   Review of Systems Review of Systems  Unable to perform ROS: Mental status change     Physical Exam Updated Vital Signs BP 128/75   Pulse 84   Temp 97.8 F (36.6 C) (Axillary)   Resp 15   Ht  (1.803 m)   Wt 88.3 kg   SpO2 100%   BMI 27.15 kg/m   Physical Exam Constitutional:      General: He is in acute distress.     Appearance: He is ill-appearing.  HENT:     Head: Normocephalic and atraumatic.     Nose: Nose normal.     Mouth/Throat:     Mouth: Mucous membranes are moist.  Eyes:     Conjunctiva/sclera: Conjunctivae normal.     Pupils: Pupils are equal, round, and reactive to light.     Comments: Left fixed  gaze with nystagmus  Neck:     Musculoskeletal: Normal range of motion and neck supple.  Cardiovascular:     Pulses: Normal pulses.     Heart sounds: Normal heart sounds.  Pulmonary:     Effort: Pulmonary effort is normal. No respiratory distress.     Breath sounds: Normal breath sounds.  Abdominal:     General: Abdomen is flat. There is no distension.  Musculoskeletal:     Right lower leg: No edema.     Left lower leg: No edema.  Skin:    General: Skin is warm.     Capillary  Refill: Capillary refill takes less than 2 seconds.  Neurological:     Mental Status: He is disoriented.     Cranial Nerves: Cranial nerve deficit present.     Comments: Patient with left-sided gaze, full neglect, aphasia, right-sided weakness, left-sided weakness at baseline, unable to follow commands, GCS of 3      ED Treatments / Results  Labs (all labs ordered are listed, but only abnormal results are displayed) Labs Reviewed  CBC - Abnormal; Notable for the following components:      Result Value   Hemoglobin 11.8 (*)    HCT 35.9 (*)    RDW 16.7 (*)    All other components within normal limits  COMPREHENSIVE METABOLIC PANEL - Abnormal; Notable for the following components:   BUN 24 (*)    Creatinine, Ser 1.45 (*)    Albumin 3.3 (*)    GFR calc non Af Amer 46 (*)    GFR calc Af Amer 53 (*)    All other components within normal limits  I-STAT CREATININE, ED - Abnormal; Notable for the following components:   Creatinine, Ser 1.40 (*)    All other components within normal limits  SARS CORONAVIRUS 2 (HOSPITAL ORDER, PERFORMED IN [facility])  DIFFERENTIAL  BLOOD GAS, ARTERIAL  CBG MONITORING, ED  CBG MONITORING, ED    EKG EKG Interpretation  Date/Time:  Friday 2018-11-04 12:51:23 EDT Ventricular Rate:  126 PR Interval:    QRS Duration: 162 QT Interval:  392 QTC Calculation: 568 R Axis:   102 Text Interpretation:  Sinus or ectopic atrial tachycardia Left bundle branch  block Confirmed by Virgina Norfolk [phone]) on November 04, 2018 1:19:59 PM   Radiology Ct Head Code Stroke Wo Contrast  Result Date: 11-04-18 CLINICAL DATA:  Code stroke. Last seen normal 1100 hours. Right-sided weakness. Left gaze. EXAM: CT HEAD WITHOUT CONTRAST TECHNIQUE: Contiguous axial images were obtained from the base of the skull through the vertex without intravenous contrast. COMPARISON:  08/12/2017 FINDINGS: Brain: Old infarction in the right MCA territory has progressed to encephalomalacia and gliosis. The remainder the brain shows age related atrophy without evidence of acute infarction, mass lesion, hemorrhage, hydrocephalus or extra-axial collection. Vascular: There is atherosclerotic calcification of the major vessels at the base of the brain. Skull: Negative Sinuses/Orbits: Clear/normal Other: None ASPECTS (Alberta Stroke Program Early CT Score) - Ganglionic level infarction (caudate, lentiform nuclei, internal capsule, insula, M1-M3 cortex): 7 - Supraganglionic infarction (M4-M6 cortex): 3 Total score (0-10 with 10 being normal): 10 IMPRESSION: 1. No acute left brain insult identified. Old right MCA territory stroke progressed to encephalomalacia. 2. ASPECTS is 10. 3. These results were communicated to Dr. Otelia Limes at 12:55 pmon June 04, 2020by text page via the [facility] messaging system. Electronically Signed   By: Paulina Fusi M.D.   On: 04-Nov-2018 12:56    Procedures .Critical Care Performed by: Virgina Norfolk, DO Authorized by: Virgina Norfolk, DO   Critical care provider statement:    Critical care time (minutes):  65   Critical care was necessary to treat or prevent imminent or life-threatening deterioration of the following conditions:  CNS failure or compromise   Critical care was time spent personally by me on the following activities:  Blood draw for specimens, development of treatment plan with patient or surrogate, discussions with primary provider, evaluation of patient's response to  treatment, examination of patient, obtaining history from patient or surrogate, ordering and performing treatments and interventions, ordering and review of laboratory studies, ordering  and review of radiographic studies, pulse oximetry, re-evaluation of patient's condition and review of old charts   I assumed direction of critical care for this patient from another provider in my specialty: no   Procedure Name: Intubation Date/Time: 10/13/2018 1:51 PM Performed by: Virgina Norfolkuratolo, Maycol Hoying, DO Pre-anesthesia Checklist: Patient identified, Emergency Drugs available, Patient being monitored, Suction available and Timeout performed Oxygen Delivery Method: Ambu bag Preoxygenation: Pre-oxygenation with 100% oxygen Induction Type: Rapid sequence Ventilation: Mask ventilation without difficulty Laryngoscope Size: Glidescope and 3 Grade View: Grade I Tube size: 7.5 mm Number of attempts: 1 Airway Equipment and Method: Stylet and Video-laryngoscopy Placement Confirmation: ETT inserted through vocal cords under direct vision,  Positive ETCO2,  Breath sounds checked- equal and bilateral and CO2 detector Secured at: 23 cm Tube secured with: ETT holder Dental Injury: Teeth and Oropharynx as per pre-operative assessment  Difficulty Due To: Difficulty was unanticipated Future Recommendations: Recommend- induction with short-acting agent, and alternative techniques readily available      (including critical care time)  Medications Ordered in ED Medications  sodium chloride flush (NS) 0.9 % injection 3 mL (has no administration in time range)  alteplase (ACTIVASE) 1 mg/mL infusion 79.5 mg (79.5 mg Intravenous New Bag/Given 10/28/2018 1304)    Followed by  0.9 %  sodium chloride infusion (has no administration in time range)  propofol (DIPRIVAN) 1000 MG/100ML infusion (has no administration in time range)  propofol (DIPRIVAN) 1000 MG/100ML infusion (15 mcg/kg/min  88.3 kg Intravenous New Bag/Given 10/04/2018 1332)    stroke: mapping our early stages of recovery book (has no administration in time range)  0.9 %  sodium chloride infusion (has no administration in time range)  acetaminophen (TYLENOL) tablet 650 mg (has no administration in time range)    Or  acetaminophen (TYLENOL) solution 650 mg (has no administration in time range)    Or  acetaminophen (TYLENOL) suppository 650 mg (has no administration in time range)  senna-docusate (Senokot-S) tablet 1 tablet (has no administration in time range)  pantoprazole (PROTONIX) injection 40 mg (has no administration in time range)  labetalol (NORMODYNE) injection 20 mg (has no administration in time range)    And  clevidipine (CLEVIPREX) infusion 0.5 mg/mL (has no administration in time range)  albuterol (PROVENTIL) (2.5 MG/3ML) 0.083% nebulizer solution 2.5 mg (has no administration in time range)  atorvastatin (LIPITOR) tablet 40 mg (has no administration in time range)  latanoprost (XALATAN) 0.005 % ophthalmic solution 1 drop (has no administration in time range)  metoprolol succinate (TOPROL-XL) 24 hr tablet 12.5 mg (has no administration in time range)  insulin aspart (novoLOG) injection 0-15 Units (has no administration in time range)  LORazepam (ATIVAN) injection 2 mg (2 mg Intravenous Given 10/13/2018 1253)  etomidate (AMIDATE) injection (2,649 mg Intravenous Given 10/30/2018 1309)  succinylcholine (ANECTINE) injection (125 mg Intravenous Given 10/18/2018 1310)     Initial Impression / Assessment and Plan / ED Course  I have reviewed the triage vital signs and the nursing notes.  Pertinent labs & imaging results that were available during my care of the patient were reviewed by me and considered in my medical decision making (see chart for details).     Noah RumpfJames Veronica Jr. is a 78 year old male history of stroke with left-sided deficits, dementia who presents to the ED with new strokelike symptoms with left-sided gaze, right-sided weakness.  Occurred about 1  hour ago.  Patient arrives with normal vitals.  No fever.  Patient with normal blood sugar.  Went directly to  the CT scan which showed no head bleed.  Patient not a candidate for IR and therefore perfusion study was not done.  Patient however is a candidate for TPA. Patient with very poor GCS.  Essentially patient with GCS of 3.  After discussion with family they would like all things including intubation, CPR, TPA.  Patient was intubated with etomidate and succinylcholine without any issues.  Patient was given TPA.  Lab work was overall unremarkable. EKG sinus. Patient to be admitted to the neurological ICU for further stroke care.  Critically ill.  Likely would benefit from further palliative care consultation which has been started in the past.  ET tube is a little high and recommended to respiratory that we advance ET tube 2 cm.    This chart was dictated using voice recognition software.  Despite best efforts to proofread,  errors can occur which can change the documentation meaning.    Final Clinical Impressions(s) / ED Diagnoses   Final diagnoses:  Cerebrovascular accident (CVA), unspecified mechanism (HCC)  Altered mental status, unspecified altered mental status type    ED Discharge Orders    None       Virgina Norfolk, DO 10/29/2018 1427

## 2018-10-08 NOTE — Code Documentation (Signed)
78yo male arriving to [facility] via [facility] at 1234. Patient from home where he was reportedly LKW at 1105. He developed right sided weakness and left gaze and EMS was called who activated a code stroke. Patient reportedly with oxygen saturations of 81% on EMS arrival and requiring 15L O2 en route. Of note, patient with h/o right MCA infarct with residual left sided weakness. Stroke team at the bedside on patient arrival. Labs drawn and Dr. Lockie Mola cleared patient for CT. CT completed. NIHSS 32, see documentation for details and code stroke times. Patient with fixed left gaze, right hemiplegia and global aphasia on exam. Patient is bed-bound with total ADL dependence, incontinence and gastrostomy tube per recent palliative care visit on 09/21/2018. Of note, patient was made DNR per palliative care documentation. Patient transferred to [facility]. Decision made to treat with tPA. BP within tPA administration parameters. 8mg  tPA bolus given at 1304 followed by 71.5mg /hr for a total of 79.5mg  per pharmacy dosing. Patient subsequently intubated by EDP. Patient monitored frequently per post-tPA protocol, see documentation for details. Patient is not a candidate for endovascular intervention. Patient transported to 4N25 for admission to ICU. Bedside handoff with 4N RN Marchelle Folks.

## 2018-10-09 ENCOUNTER — Inpatient Hospital Stay (HOSPITAL_COMMUNITY): Payer: Medicare Other

## 2018-10-09 ENCOUNTER — Encounter (HOSPITAL_COMMUNITY): Payer: Medicare Other

## 2018-10-09 DIAGNOSIS — I639 Cerebral infarction, unspecified: Secondary | ICD-10-CM

## 2018-10-09 LAB — LIPID PANEL
Cholesterol: 159 mg/dL (ref 0–200)
HDL: 35 mg/dL — ABNORMAL LOW (ref >40)
LDL Cholesterol: 103 mg/dL — ABNORMAL HIGH (ref 0–99)
Total CHOL/HDL Ratio: 4.5 RATIO
Triglycerides: 106 mg/dL (ref <150)
VLDL: 21 mg/dL (ref 0–40)

## 2018-10-09 LAB — PHOSPHORUS
Phosphorus: 3.1 mg/dL (ref 2.5–4.6)
Phosphorus: 2.8 mg/dL (ref 2.5–4.6)

## 2018-10-09 LAB — CBC
HCT: 33.3 % — ABNORMAL LOW (ref 39.0–52.0)
Hemoglobin: 11.1 g/dL — ABNORMAL LOW (ref 13.0–17.0)
MCH: 27.5 pg (ref 26.0–34.0)
MCHC: 33.3 g/dL (ref 30.0–36.0)
MCV: 82.6 fL (ref 80.0–100.0)
Platelets: 230 10*3/uL (ref 150–400)
RBC: 4.03 MIL/uL — ABNORMAL LOW (ref 4.22–5.81)
RDW: 16.5 % — ABNORMAL HIGH (ref 11.5–15.5)
WBC: 8.5 10*3/uL (ref 4.0–10.5)
nRBC: 0 % (ref 0.0–0.2)

## 2018-10-09 LAB — HEMOGLOBIN A1C
Hgb A1c MFr Bld: 6.4 % — ABNORMAL HIGH (ref 4.8–5.6)
Mean Plasma Glucose: 136.98 mg/dL

## 2018-10-09 LAB — GLUCOSE, CAPILLARY
Glucose-Capillary: 106 mg/dL — ABNORMAL HIGH (ref 70–99)
Glucose-Capillary: 124 mg/dL — ABNORMAL HIGH (ref 70–99)
Glucose-Capillary: 137 mg/dL — ABNORMAL HIGH (ref 70–99)
Glucose-Capillary: 85 mg/dL (ref 70–99)
Glucose-Capillary: 92 mg/dL (ref 70–99)
Glucose-Capillary: 96 mg/dL (ref 70–99)

## 2018-10-09 LAB — BASIC METABOLIC PANEL
Anion gap: 12 (ref 5–15)
BUN: 31 mg/dL — ABNORMAL HIGH (ref 8–23)
CO2: 21 mmol/L — ABNORMAL LOW (ref 22–32)
Calcium: 9.1 mg/dL (ref 8.9–10.3)
Chloride: 109 mmol/L (ref 98–111)
Creatinine, Ser: 1.26 mg/dL — ABNORMAL HIGH (ref 0.61–1.24)
GFR calc Af Amer: >60 mL/min (ref >60)
GFR calc non Af Amer: 55 mL/min — ABNORMAL LOW (ref >60)
Glucose, Bld: 124 mg/dL — ABNORMAL HIGH (ref 70–99)
Potassium: 4.3 mmol/L (ref 3.5–5.1)
Sodium: 142 mmol/L (ref 135–145)

## 2018-10-09 LAB — MAGNESIUM
Magnesium: 1.6 mg/dL — ABNORMAL LOW (ref 1.7–2.4)
Magnesium: 1.9 mg/dL (ref 1.7–2.4)

## 2018-10-09 LAB — ECHOCARDIOGRAM LIMITED
Height: 71 in
Weight: 3114.66 oz

## 2018-10-09 MED ORDER — MAGNESIUM SULFATE 2 GM/50ML IV SOLN
2.0000 g | Freq: Once | INTRAVENOUS | Status: AC
  Administered 2018-10-09: 2 g via INTRAVENOUS
  Filled 2018-10-09: qty 50

## 2018-10-09 MED ORDER — ATORVASTATIN CALCIUM 80 MG PO TABS
80.0000 mg | ORAL_TABLET | Freq: Every day | ORAL | Status: DC
  Administered 2018-10-09 – 2018-10-12 (×4): 80 mg
  Filled 2018-10-09 (×5): qty 1

## 2018-10-09 NOTE — Progress Notes (Signed)
  Echocardiogram 2D Echocardiogram has been performed.  Celene Skeen 10/09/2018, 12:12 PM

## 2018-10-09 NOTE — Progress Notes (Signed)
STROKE TEAM PROGRESS NOTE   HISTORY OF PRESENT ILLNESS (per record) Bobby RumpfJames Tweten Jr. is an 78 y.o. male with a past history of stroke with residual left hemiparesis who acutely developed right sided weakness and unresponsiveness at home today. LKN was 1105. On EMS arrival to his residence, he was plegic on the right with leftward eye deviation. He is on Plavix and ASA at home. Not on a blood thinner.   The patient's PMHx includes Alzheimer dementia, prior stroke, CKD, DM and HTN.  CT head shows no acute hemorrhage or left sided hypodensity. A large chronic right MCA territory ischemic infarction, which has progressed to encephalomalacia, is noted. ASPECTS is 10.  LSN: [id number] tPA Given: Yes - Friday 5/8//20 @ 13:15   SUBJECTIVE (INTERVAL HISTORY) No major changes.    OBJECTIVE Vitals:   10/09/18 0630 10/09/18 0645 10/09/18 0700 10/09/18 0800  BP: 121/63 121/76 125/65 133/79  Pulse: 79 80  79  Resp: 16 17 16 16   Temp:    98.2 F (36.8 C)  TempSrc:    Axillary  SpO2: 94% 90%  99%  Weight:      Height:        CBC:  Recent Labs  Lab Nov 24, 2018 1242 10/09/18 0436  WBC 7.9 8.5  NEUTROABS 3.4  --   HGB 11.8* 11.1*  HCT 35.9* 33.3*  MCV 83.3 82.6  PLT 219 230    Basic Metabolic Panel:  Recent Labs  Lab Nov 24, 2018 1242 Nov 24, 2018 1245 Nov 24, 2018 1657 10/09/18 0436  NA 142  --   --  142  K 4.3  --   --  4.3  CL 107  --   --  109  CO2 23  --   --  21*  GLUCOSE 93  --   --  124*  BUN 24*  --   --  31*  CREATININE 1.45* 1.40*  --  1.26*  CALCIUM 10.0  --   --  9.1  MG  --   --  1.8 1.6*  PHOS  --   --  4.6 3.1    Lipid Panel:     Component Value Date/Time   CHOL 159 10/09/2018 0436   TRIG 106 10/09/2018 0436   HDL 35 (L) 10/09/2018 0436   CHOLHDL 4.5 10/09/2018 0436   VLDL 21 10/09/2018 0436   LDLCALC 103 (H) 10/09/2018 0436   HgbA1c:  Lab Results  Component Value Date   HGBA1C 6.4 (H) 10/09/2018   Urine Drug Screen:     Component Value Date/Time   LABOPIA  NONE DETECTED 07/23/2017 0219   COCAINSCRNUR NONE DETECTED 07/23/2017 0219   COCAINSCRNUR NEGATIVE 04/03/2013 1924   LABBENZ NONE DETECTED 07/23/2017 0219   AMPHETMU NONE DETECTED 07/23/2017 0219   THCU NONE DETECTED 07/23/2017 0219   LABBARB NONE DETECTED 07/23/2017 0219    Alcohol Level     Component Value Date/Time   ETH <10 07/23/2017 1638    IMAGING   Dg Chest Portable 1 View 10/28/2018 IMPRESSION:  Endotracheal tube slightly high, 9.5 cm above the carina. Lungs essentially clear.   Dg Abd Portable 1 View 10/27/2018 IMPRESSION:  Peg tube in place. Moderate amount of fecal matter. No acute finding.   Ct Head Code Stroke Wo Contrast 10/03/2018 IMPRESSION:  1. No acute left brain insult identified. Old right MCA territory stroke progressed to encephalomalacia.  2. ASPECTS is 10.   Ct Head Wo Contrast - repeat 10/09/2018 Pending   MRI / MRA Head 10/09/2018 pending  Transthoracic Echocardiogram  00/00/2020 Pending   Bilateral Carotid Dopplers  00/00/2020 Pending   EKG - sinus or ectopic atrial tachycardia - rate 126 BPM LBBB (See cardiology reading for complete details)    PHYSICAL EXAM Blood pressure 133/79, pulse 79, temperature 98.2 F (36.8 C), temperature source Axillary, resp. rate 16, height 5\' 11"  (1.803 m), weight 88.3 kg, SpO2 99 %.  Will not open eyes or follow commands.  No sedation on board. General bilateral weakness and minimal movement to pain.      ASSESSMENT/PLAN Mr. Joshia Roser. is a 78 y.o. male with history of prior stroke, Alzheimer's dementia, CHD, DM, Htn and gout presenting with unresponsiveness, plegic on the right and leftward eye deviation.   Suspect probably cardioembolic, given EF 40% in 2019 and ECG abnormalities.   tPA Given: Friday 5/8//20 @ 13:15  Stroke:  MRI pending  Resultant  Aphasia and right sided weakness.  CT head - No acute left brain insult identified. Old right MCA territory stroke progressed to  encephalomalacia.  CT Head repeat - pending  MRI head - pending  MRA head - pending  CTA H&N  - not ordered  Carotid Doppler - pending  2D Echo - pending  LDL - 103  HgbA1c - 6.4  UDS - not performed  VTE prophylaxis - SCDs  Diet - NPO   aspirin 81 mg daily and clopidogrel 75 mg daily prior to admission, now on No antithrombotic S/P tPA  Patient will be counseled to be compliant with his antithrombotic medications  Ongoing aggressive stroke risk factor management  Therapy recommendations:  pending  Disposition:  Pending  Hypertension  Stable . Permissive hypertension - SBP < 180 mmHg S/P tPA . Long-term BP goal normotensive  Hyperlipidemia  Lipid lowering medication PTA:  Lipitor 40 mg daily  LDL 103, goal < 70  Current lipid lowering medication: Lipitor 40 mg daily - increase to 80 mg daily  Continue statin at discharge  Diabetes  HgbA1c 6.4, goal < 7.0  Controlled  Other Stroke Risk Factors  Advanced age  ETOH use, advised to drink no more than 1 alcoholic beverage per day.  Overweight, Body mass index is 27.15 kg/m., recommend weight loss, diet and exercise as appropriate   Hx stroke/TIA   Other Active Problems  CKD - stage III - creatinine - 1.45->1.40->1.26  Mild anemia - Hb - 11.8->11.1  Mg - 1.6 -> repeat pending   Plan  Await further imaging  Since likely cardioembolic, he should be on anticoagulation, but with dementia is not ideal candidate.  I will likely convert to Plavix monotherapy if no hemorrhage found this afternoon.  Hospital day # 1  Weston Settle, MS, MD  To contact Stroke Continuity provider, please refer to [email or web address]. After hours, contact General Neurology

## 2018-10-09 NOTE — Progress Notes (Signed)
OT Cancellation Note  Patient Details Name: Noah Bush. MRN: [id number] DOB: 1941-01-27   Cancelled Treatment:    Reason Eval/Treat Not Completed: Active bedrest order(Will return as schedule allows. Thank you.)  Melba Araki M Rashaud Ybarbo Bernell Haynie MSOT, OTR/L Acute Rehab Pager: [phone] Office: [phone] 10/09/2018, 6:52 AM

## 2018-10-09 NOTE — Progress Notes (Addendum)
NAME:  Noah Elliston., MRN:  [id number], DOB:  06/07/1940, LOS: 1 ADMISSION DATE:  10/14/2018, CONSULTATION DATE:  10/22/2018 REFERRING MD:  Dr. Otelia Limes, CHIEF COMPLAINT:  AMS   Brief History   77 y/o M who presented to [facility] ER with right sided weakness, LSN 1105.  CT head without acute hemorrhage or left sided hypodensity.  tPA administered. Intubated for AMS/poor airway protection.  History of present illness   78 y/o M who presented to [facility] ER with reports of acute onset right sided weakness.  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  He was last seen at his baseline at 1105 am.  He reportedly has been seen by Palliative Care at home and was made a DNR.    EMS found him altered and hypoxic with saturations of 81%, placed on NRB. On arrival to the ER, he was noted to have right hemiplegia, leftward gaze, not speaking, given tPA.   CODE STROKE activated and patient's CT showed no acute hemorrhage or left sided hypodensity. Old right MCA territory ischemic infarct has progressed to encephalomalacia.  Initial labs- Na 142, K 4.3, Cl 107, CO2 23, glucose 93, BUN 24, sr cr 1.45, WBC 7.9, Hgb 11.8 and platelets 219.  CXR was negative. The patient was intubated for airway protection.  COVID testing negative.  PCCM called for ICU admission.   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  On plavix / ASA at baseline.  Alzheimer's Dementia  CKD  DM  HTN  UTI with prior E-Coli ACD  Significant Hospital Events   5/8 admit  Consults:  PCCM  Procedures:  5/8 ETT  Significant Diagnostic Tests:  CT Head 5/8 >> no acute left brain abnormality, old right MCA territory CVA that progressed to encephalomalacia  Micro Data:  Covid neg  Antimicrobials:   n/a   Interim history/subjective:  No overnight events. Pt remains intubated, no obvious effort to breath over vent. spont movement of L side  Objective   Blood pressure 133/79, pulse 79, temperature 98.2 F (36.8 C), temperature source Axillary, resp. rate 16, height 5\' 11"  (1.803 m), weight 88.3 kg, SpO2 99 %.    Vent Mode: PRVC FiO2 (%):  [40 %-100 %] 40 % Set Rate:  [16 bmp] 16 bmp Vt Set:  [600 mL] 600 mL PEEP:  [5 cmH20] 5 cmH20 Plateau Pressure:  [14 cmH20-16 cmH20] 15 cmH20   Intake/Output Summary (Last 24 hours) at 10/09/2018 0300 Last data filed at 10/09/2018 0600 Gross per 24 hour  Intake 1170.83 ml  Output 700 ml  Net 470.83 ml   Filed Weights   10/23/2018 1252  Weight: 88.3 kg    Examination: General: NAD HENT: AT/[place]; PERRLA Lungs: CTAB Cardiovascular: RRR no r/m/g; tele demonstrates PVCs fairly consistently Abdomen: SND Extremities: right forearm edema near previous IV placement/attempt site. nonerythematous or indurated Neuro: w/d to stim L UE and LLE; RLE w/d to stim. Does not respond to requests/commands PERRLA. GU: UOP adequate, foley  Resolved Hospital Problem list   None to date  Assessment & Plan:  Prev. R MCA stroke with L hemiparesis, now right sided weakness, s/p TPA. GCS still low  Hypoxia/airway protection: Patient intubated, no apparent effort to breathe over vent PLAN: continue mechanical ventilation, wean  as able  Neuro: continue to follow exam for any improvement; given lack of clarity re: DNR and family goals of care at this point, will ask palliative care to coordinate previous wishes  F/E/N:  PLAN: --Replete Mg --Continue TF --DM: SSI   Best practice:  Diet: tube feeds Pain/Anxiety/Delirium protocol (if indicated):  VAP protocol (if indicated): HOB 30 DVT prophylaxis: SCDs GI prophylaxis: PPI Glucose control: SSI Mobility: bed Code Status: [email or web address].listed full for now but apparently palliative care at facility DNR? Family  Communication: no family at bedside Disposition: 4N ICU  Labs   CBC: Recent Labs  Lab 10/09/18 1242 10/09/18 0436  WBC 7.9 8.5  NEUTROABS 3.4  --   HGB 11.8* 11.1*  HCT 35.9* 33.3*  MCV 83.3 82.6  PLT 219 230    Basic Metabolic Panel: Recent Labs  Lab 10/09/18 1242 10/09/18 1245 10/09/18 1657 10/09/18 0436  NA 142  --   --  142  K 4.3  --   --  4.3  CL 107  --   --  109  CO2 23  --   --  21*  GLUCOSE 93  --   --  124*  BUN 24*  --   --  31*  CREATININE 1.45* 1.40*  --  1.26*  CALCIUM 10.0  --   --  9.1  MG  --   --  1.8 1.6*  PHOS  --   --  4.6 3.1   GFR: Estimated Creatinine Clearance: 52.3 mL/min (A) (by C-G formula based on SCr of 1.26 mg/dL (H)). Recent Labs  Lab 10/09/18 1242 10/09/18 0436  WBC 7.9 8.5    Liver Function Tests: Recent Labs  Lab 10/09/18 1242  AST 17  ALT 13  ALKPHOS 53  BILITOT 0.5  PROT 7.0  ALBUMIN 3.3*   No results for input(s): LIPASE, AMYLASE in the last 168 hours. No results for input(s): AMMONIA in the last 168 hours.  ABG    Component Value Date/Time   PHART 7.404 10/09/2018 1458   PCO2ART 39.2 10/09/2018 1458   PO2ART 477 (H) 10/09/2018 1458   HCO3 24.0 10/09/2018 1458   TCO2 20 (L) 07/23/2017 1334   ACIDBASEDEF 0.1 10/09/2018 1458   O2SAT 99.4 10/09/2018 1458     Coagulation Profile: No results for input(s): INR, PROTIME in the last 168 hours.  Cardiac Enzymes: No results for input(s): CKTOTAL, CKMB, CKMBINDEX, TROPONINI in the last 168 hours.  HbA1C: Hemoglobin A1C  Date/Time Value Ref Range Status  03/06/2013 01:33 AM 6.6 (H) 4.2 - 6.3 % Final    Comment:    The American Diabetes Association recommends that a primary goal of therapy should be <7% and that physicians should reevaluate the treatment regimen in patients with HbA1c values consistently >8%.   01/16/2013 04:39 AM 7.7 (H) 4.2 - 6.3 % Final    Comment:    The American Diabetes Association recommends that a primary goal of therapy should  be <7% and that physicians should reevaluate the treatment regimen in patients with HbA1c values consistently >8%.    Hgb A1c MFr Bld  Date/Time Value Ref Range Status  10/09/2018 04:36 AM 6.4 (H) 4.8 - 5.6 % Final    Comment:    (NOTE) Pre diabetes:          5.7%-6.4% Diabetes:              >6.4% Glycemic control for   <7.0% adults with diabetes   07/24/2017 05:46 AM 6.9 (  H) 4.8 - 5.6 % Final    Comment:    (NOTE) Pre diabetes:          5.7%-6.4% Diabetes:              >6.4% Glycemic control for   <7.0% adults with diabetes     CBG: Recent Labs  Lab 10/16/18 1540 Oct 16, 2018 1928 2018/10/16 2328 10/09/18 0321 10/09/18 0736  GLUCAP 78 88 102* 137* 92    Review of Systems:     Past Medical History  He,  has a past medical history of Alzheimer's dementia (HCC), Cerebral infarction (HCC) (07/27/2017), Cerebrovascular accident (CVA) (HCC) (07/23/2017), CKD (chronic kidney disease), stage III (HCC), Diabetes mellitus without complication (HCC), Gout, and Hypertension.   Surgical History    Past Surgical History:  Procedure Laterality Date  . COLONOSCOPY WITH PROPOFOL N/A 05/09/2015   Procedure: COLONOSCOPY WITH PROPOFOL;  Surgeon: [name], MD;  Location: [facility] ENDOSCOPY;  Service: Endoscopy;  Laterality: N/A;  . IR GASTROSTOMY TUBE MOD SED  08/13/2017  . TIBIA FRACTURE SURGERY Left      Social History   reports that he has never smoked. He has never used smokeless tobacco. He reports current alcohol use of about 2.0 standard drinks of alcohol per week. He reports that he does not use drugs.   Family History   His family history includes Diabetes in his paternal grandfather.   Allergies No Known Allergies   Home Medications  Prior to Admission medications   Medication Sig Start Date End Date Taking? Authorizing Provider  acetaminophen (TYLENOL) 325 MG tablet Take 1-2 tablets (325-650 mg total) by mouth every 4 (four) hours as needed for mild pain. 08/24/17  Yes  Love, Evlyn Kanner, PA-C  aspirin EC 81 MG tablet Take 81 mg by mouth daily.   Yes [provider]  atorvastatin (LIPITOR) 40 MG tablet Take 40 mg by mouth at bedtime. 10/01/18  Yes [provider]  clopidogrel (PLAVIX) 75 MG tablet Take 1 tablet (75 mg total) by mouth daily. 08/25/17  Yes Love, Evlyn Kanner, PA-C  latanoprost (XALATAN) 0.005 % ophthalmic solution Place 1 drop into the left eye at bedtime. 08/24/17  Yes Love, Evlyn Kanner, PA-C  memantine (NAMENDA) 5 MG tablet Take 5 mg by mouth daily.   Yes [provider]  metoprolol succinate (TOPROL-XL) 25 MG 24 hr tablet Take 0.5 tablets (12.5 mg total) by mouth daily. Patient taking differently: Take 12.5 mg by mouth 2 (two) times a day.  07/29/17  Yes Garnette Gunner, MD  multivitamin (RENA-VIT) TABS tablet Take 1 tablet by mouth at bedtime. 08/24/17  Yes Love, Evlyn Kanner, PA-C  Nutritional Supplements (FEEDING SUPPLEMENT, OSMOLITE 1.5 CAL,) LIQD Place 1,000 mLs into feeding tube daily. Patient taking differently: Take 1,000 mLs by mouth daily.  01/03/18  Yes Houston Siren, MD  tamsulosin (FLOMAX) 0.4 MG CAPS capsule Take 0.4 mg by mouth daily. 10/01/18  Yes [provider]  albuterol (PROVENTIL) (2.5 MG/3ML) 0.083% nebulizer solution Take 3 mLs (2.5 mg total) by nebulization every 6 (six) hours as needed for wheezing or shortness of breath. Patient not taking: Reported on Oct 16, 2018 08/24/17   Love, Evlyn Kanner, PA-C  Amino Acids-Protein Hydrolys (FEEDING SUPPLEMENT, PRO-STAT SUGAR FREE 64,) LIQD Place 30 mLs into feeding tube 3 (three) times daily between meals. Patient not taking: Reported on Oct 16, 2018 08/24/17   Love, Evlyn Kanner, PA-C  atorvastatin (LIPITOR) 80 MG tablet Take 1 tablet (80 mg total) by mouth daily  at 6 PM. Patient not taking: Reported on 2018/10/10 07/28/17   Garnette Gunner, MD  Menthol-Methyl Salicylate (MUSCLE RUB) 10-15 % CREA Apply 1 application topically 4 (four) times daily. To bilateral hands and bilateral  knees Patient not taking: Reported on 10-10-18 08/24/17   Love, Evlyn Kanner, PA-C  methylphenidate (RITALIN) 5 MG tablet Take 1 tablet (5 mg total) by mouth 2 (two) times daily with breakfast and lunch. Patient not taking: Reported on 2018-10-10 08/24/17   Love, Evlyn Kanner, PA-C  Water For Irrigation, Sterile (FREE WATER) SOLN Place 150 mLs into feeding tube 4 (four) times daily. 01/03/18   Houston Siren, MD     Critical care time:  I have independently seen and examined the patient, reviewed data, and developed an assessment and plan. A total of 36 minutes were spent in critical care assessment and medical decision making. This critical care time does not reflect procedure time, or teaching time or supervisory time of PA/NP/Med student/Med Resident, etc but could involve care discussion time.   Gwynne Edinger, MD PhD   10/09/18

## 2018-10-09 NOTE — Progress Notes (Signed)
PT Cancellation Note  Patient Details Name: Noah Bush. MRN: [id number] DOB: 08/10/1940   Cancelled Treatment:    Reason Eval/Treat Not Completed: Active bedrest order   Laurina Bustle, PT, DPT Acute Rehabilitation Services Pager [phone] Office [phone]    Vanetta Mulders 10/09/2018, 7:44 AM

## 2018-10-10 ENCOUNTER — Inpatient Hospital Stay (HOSPITAL_COMMUNITY): Payer: Medicare Other

## 2018-10-10 DIAGNOSIS — I639 Cerebral infarction, unspecified: Secondary | ICD-10-CM

## 2018-10-10 LAB — MAGNESIUM: Magnesium: 1.9 mg/dL (ref 1.7–2.4)

## 2018-10-10 LAB — COMPREHENSIVE METABOLIC PANEL
ALT: 10 U/L (ref 0–44)
AST: 16 U/L (ref 15–41)
Albumin: 2.6 g/dL — ABNORMAL LOW (ref 3.5–5.0)
Alkaline Phosphatase: 45 U/L (ref 38–126)
Anion gap: 9 (ref 5–15)
BUN: 37 mg/dL — ABNORMAL HIGH (ref 8–23)
CO2: 19 mmol/L — ABNORMAL LOW (ref 22–32)
Calcium: 8.8 mg/dL — ABNORMAL LOW (ref 8.9–10.3)
Chloride: 115 mmol/L — ABNORMAL HIGH (ref 98–111)
Creatinine, Ser: 1.32 mg/dL — ABNORMAL HIGH (ref 0.61–1.24)
GFR calc Af Amer: 60 mL/min — ABNORMAL LOW (ref >60)
GFR calc non Af Amer: 52 mL/min — ABNORMAL LOW (ref >60)
Glucose, Bld: 127 mg/dL — ABNORMAL HIGH (ref 70–99)
Potassium: 4.3 mmol/L (ref 3.5–5.1)
Sodium: 143 mmol/L (ref 135–145)
Total Bilirubin: 0.6 mg/dL (ref 0.3–1.2)
Total Protein: 5.5 g/dL — ABNORMAL LOW (ref 6.5–8.1)

## 2018-10-10 LAB — GLUCOSE, CAPILLARY
Glucose-Capillary: 109 mg/dL — ABNORMAL HIGH (ref 70–99)
Glucose-Capillary: 110 mg/dL — ABNORMAL HIGH (ref 70–99)
Glucose-Capillary: 84 mg/dL (ref 70–99)
Glucose-Capillary: 95 mg/dL (ref 70–99)
Glucose-Capillary: 104 mg/dL — ABNORMAL HIGH (ref 70–99)

## 2018-10-10 LAB — CBC
HCT: 30.1 % — ABNORMAL LOW (ref 39.0–52.0)
Hemoglobin: 9.9 g/dL — ABNORMAL LOW (ref 13.0–17.0)
MCH: 27.2 pg (ref 26.0–34.0)
MCHC: 32.9 g/dL (ref 30.0–36.0)
MCV: 82.7 fL (ref 80.0–100.0)
Platelets: 187 10*3/uL (ref 150–400)
RBC: 3.64 MIL/uL — ABNORMAL LOW (ref 4.22–5.81)
RDW: 16.7 % — ABNORMAL HIGH (ref 11.5–15.5)
WBC: 9.4 10*3/uL (ref 4.0–10.5)
nRBC: 0 % (ref 0.0–0.2)

## 2018-10-10 NOTE — Progress Notes (Addendum)
STROKE TEAM PROGRESS NOTE   HISTORY OF PRESENT ILLNESS (per record) Noah RumpfJames Schaberg Jr. is an 78 y.o. male with a past history of stroke with residual left hemiparesis who acutely developed right sided weakness and unresponsiveness at home today. LKN was 1105. On EMS arrival to his residence, he was plegic on the right with leftward eye deviation. He is on Plavix and ASA at home. Not on a blood thinner.   The patient's PMHx includes Alzheimer dementia, prior stroke, CKD, DM and HTN.  CT head shows no acute hemorrhage or left sided hypodensity. A large chronic right MCA territory ischemic infarction, which has progressed to encephalomalacia, is noted. ASPECTS is 10.  LSN: [id number] tPA Given: Yes - Friday 5/8//20 @ 13:15   SUBJECTIVE (INTERVAL HISTORY) No major changes.  EF 20-25%.  MRI - left BG/left CR acute infarct with petechial hemorrhage.  Old right MCA encephalomalacia. 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    OBJECTIVE Vitals:   10/10/18 0700 10/10/18 0800 10/10/18 0808 10/10/18 0900  BP: (!) 109/57 (!) 112/58  (!) 110/56  Pulse: 70 (!) 34  65  Resp: 13 16  16   Temp:  98.8 F (37.1 C)    TempSrc:  Axillary    SpO2: 100% 100% 100% 100%  Weight:      Height:        CBC:  Recent Labs  Lab 10/26/2018 1242 10/09/18 0436 10/10/18 0258  WBC 7.9 8.5 9.4  NEUTROABS 3.4  --   --   HGB 11.8* 11.1* 9.9*  HCT 35.9* 33.3* 30.1*  MCV 83.3 82.6 82.7  PLT 219 230 187    Basic Metabolic Panel:  Recent Labs  Lab 10/09/18 0436 10/09/18 1615 10/10/18 0258  NA 142  --  143  K 4.3  --  4.3  CL 109  --  115*  CO2 21*  --  19*  GLUCOSE 124*  --  127*  BUN 31*  --  37*  CREATININE 1.26*  --  1.32*  CALCIUM 9.1  --  8.8*  MG 1.6* 1.9 1.9  PHOS 3.1 2.8  --     Lipid Panel:     Component Value Date/Time   CHOL 159 10/09/2018 0436   TRIG 106 10/09/2018 0436   HDL 35 (L) 10/09/2018 0436   CHOLHDL 4.5 10/09/2018 0436   VLDL 21 10/09/2018  0436   LDLCALC 103 (H) 10/09/2018 0436   HgbA1c:  Lab Results  Component Value Date   HGBA1C 6.4 (H) 10/09/2018   Urine Drug Screen:     Component Value Date/Time   LABOPIA NONE DETECTED 07/23/2017 0219   COCAINSCRNUR NONE DETECTED 07/23/2017 0219   COCAINSCRNUR NEGATIVE 04/03/2013 1924   LABBENZ NONE DETECTED 07/23/2017 0219   AMPHETMU NONE DETECTED 07/23/2017 0219   THCU NONE DETECTED 07/23/2017 0219   LABBARB NONE DETECTED 07/23/2017 0219    Alcohol Level     Component Value Date/Time   ETH <10 07/23/2017 1638    IMAGING  Dg Chest Portable 1 View 10/22/2018 IMPRESSION:  Endotracheal tube slightly high, 9.5 cm above the carina. Lungs essentially clear.   Dg Abd Portable 1 View 10/05/2018 IMPRESSION:  Peg tube in place. Moderate amount of fecal matter. No acute finding.   Ct Head Code Stroke Wo Contrast 10/25/2018 IMPRESSION:  1. No acute left brain insult identified. Old right MCA territory stroke progressed to encephalomalacia.  2. ASPECTS is 10.    MRI / MRA Head  10/09/2018 IMPRESSION: 1. 4 cm acute left basal ganglia infarct with small volume petechial hemorrhage. 2. Large chronic right MCA infarct and moderate chronic small vessel ischemic disease. 3. Small chronic left cerebellar infarct, new from 07/2017. 4. 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   Transthoracic Echocardiogram  10/09/2018 IMPRESSIONS 1. The left ventricle has severely reduced systolic function, with an ejection fraction of 25-30%. The cavity size was mildly dilated. There is mild concentric left ventricular hypertrophy. Left ventricular diastolic Doppler parameters are consistent with impaired relaxation.  2. The mitral valve is grossly normal. No evidence of mitral valve stenosis.  3. The tricuspid valve was grossly normal.  4. The aortic valve is grossly normal Mild thickening of  the aortic valve Mild calcification of the aortic valve. No stenosis of the aortic valve.   Bilateral Carotid Dopplers  00/00/2020 Pending   EKG - sinus or ectopic atrial tachycardia - rate 126 BPM LBBB (See cardiology reading for complete details)    PHYSICAL EXAM Blood pressure (!) 110/56, pulse 65, temperature 98.8 F (37.1 C), temperature source Axillary, resp. rate 16, height  (1.803 m), weight 92.4 kg, SpO2 100 %.  Intubated. Will open eyes minimally to pain.  Will not follow commands.  No sedation on board. General bilateral weakness. Tries to localize to pain with left hand on the left leg.  No movement on the RUE and RLE.        ASSESSMENT/PLAN [name] is a 78 y.o. male with history of prior stroke, Alzheimer's dementia, CHD, DM, Htn and gout presenting with unresponsiveness, plegic on the right and leftward eye deviation.   Suspect probably cardioembolic, given EF 40% in 2019 and ECG abnormalities.   tPA Given: Friday 5/8//20 @ 13:15  Stroke: acute left basal ganglia infarct  Resultant  Aphasia and right sided weakness.  CT head - No acute left brain insult identified. Old right MCA territory stroke progressed to encephalomalacia.  MRI head - 4 cm acute left basal ganglia infarct with small volume petechial hemorrhage.  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  CTA H&N  - not ordered  Carotid Doppler - pending  2D Echo  - EF 20 - 25%. Possible source of emboli.  LDL - 103  HgbA1c - 6.4  UDS - not performed  VTE prophylaxis - SCDs  Diet - NPO   Patient will be counseled to be compliant with his antithrombotic medications  Ongoing aggressive stroke risk factor management  Therapy recommendations:  pending  Disposition:  Pending  Hypertension  Stable . Permissive hypertension - SBP < 180 mmHg S/P tPA . Long-term BP goal  normotensive  Hyperlipidemia  Lipid lowering medication PTA:  Lipitor 40 mg daily  LDL 103, goal < 70  Current lipid lowering medication: Lipitor 40 mg daily - increase to 80 mg daily  Continue statin at discharge  Diabetes  HgbA1c 6.4, goal < 7.0  Controlled  Other Stroke Risk Factors  Advanced age  ETOH use, advised to drink no more than 1 alcoholic beverage per day.  Overweight, Body mass index is 28.41 kg/m., recommend weight loss, diet and exercise as appropriate   Hx stroke/TIA   Other Active Problems  CKD - stage III - creatinine - 1.45->1.40->1.26->1.32  Mild anemia - Hb - 11.8->11.1->9.9 (recheck in AM)  Mg - 1.6 ->  repeat 1.9  EF 20 - 25% - may have caused emboli (monitor for s/s of CHF), although source may have been left ICA stenosis too.  Not amenable to surgery given location.  Small volume petechial hemorrhage.  Palliative Care consult - pending  Plan No antiplatelet therapy or anticoagulation since he had hemorrhagic conversion from IV tPA.  Cont supportive management.     Hospital day # 2  Weston Settle, MS, MD  To contact Stroke Continuity provider, please refer to [email or web address]. After hours, contact General Neurology

## 2018-10-10 NOTE — Evaluation (Signed)
Occupational Therapy Evaluation Patient Details Name: Noah Bush. MRN: [id number] DOB: 1940-08-08 Today's Date: 10/10/2018    History of Present Illness 78 yo male presenting with right side weakness. tPA administered on 5/8. MRI showing left basal ganglia infarct, small chronic left cerebellar infarct, and chronic right MCA. Intubated on 5/8. PMH including left hemiparesis s/p R CVA, Alzheimer's Dementia, CKD, DM, and HTN.   Clinical Impression   Per chart review, pt was bed bound and required assistance for ADLs with left hemiplegia from prior CVA. Pt currently requiring Total A for ADLs and bed mobility. Pt presenting with right hemiparesis, chronic left hemiplegia, left gaze and head turn with no tracking, and poor cognition with no following of cues. VSS throughout on vent 40% FiO2 5 PEEP. Pt would benefit from further acute OT to facilitate safe dc. Pending family support and pt progress, recommend dc to SNF vs LTACH for further OT to optimize safety, independence with ADLs, and return to PLOF.      Follow Up Recommendations  SNF;LTACH;Other (comment)(Pending family goals)    Equipment Recommendations  Other (comment)(TBA)    Recommendations for Other Services PT consult;Speech consult;Other (comment)(Pallative)     Precautions / Restrictions Precautions Precautions: Fall Precaution Comments: baseline L hemiplegia, new R hemiparesis Restrictions Weight Bearing Restrictions: No      Mobility Bed Mobility Overal bed mobility: Needs Assistance Bed Mobility: Rolling Rolling: Total assist;+2 for physical assistance         General bed mobility comments: TotalA + 2 for turning towards left for wedge placement  Transfers                      Balance                                           ADL either performed or assessed with clinical judgement   ADL Overall ADL's : Needs assistance/impaired                                        General ADL Comments: Total A for ADLs due to cognition and weakness.      Vision   Vision Assessment?: Vision impaired- to be further tested in functional context;Yes Eye Alignment: Impaired (comment) Alignment/Gaze Preference: Head turned;Gaze left Additional Comments: Pt with left gaze and head turn. Pt not tracking. Noting left pulsating nystagmus      Perception     Praxis      Pertinent Vitals/Pain Pain Assessment: Faces Faces Pain Scale: Hurts even more Pain Location: grimacing with passive right shoulder flexion Pain Descriptors / Indicators: Grimacing Pain Intervention(s): Monitored during session;Repositioned     Hand Dominance     Extremity/Trunk Assessment Upper Extremity Assessment Upper Extremity Assessment: RUE deficits/detail;LUE deficits/detail RUE Deficits / Details: No active movement. Increased edema RUE Coordination: decreased fine motor;decreased gross motor LUE Deficits / Details: Pt with spontaneous movement. Pt with unpurposeful reaching and grasping at items such as bedsheets, gown, cath, and OT's hand. Pt tendency for extension of elbow and pushing into extenion while increasing grasp at hand. Increased cues to "relax"; pt releasing grasp but unsure of how purposeful it is. LUE Coordination: decreased fine motor;decreased gross motor   Lower Extremity Assessment Lower Extremity Assessment: Defer to PT evaluation RLE Deficits /  Details: No active movement noted. PROM WFL LLE Deficits / Details: L hemiplegia from prior stroke. No active movement noted. L knee extension and plantarflexion contracture       Communication Communication Communication: Other (comment)(nonverbal, intubated)   Cognition Arousal/Alertness: Awake/alert Behavior During Therapy: Flat affect Overall Cognitive Status: Difficult to assess Area of Impairment: Following commands                       Following Commands: (not following commands)        General Comments: Responsive to pain. Not following commands. head turned and eyes looking left.    General Comments  VSS throughout on vent 40% FiO2 5 PEEP    Exercises Exercises: General Upper Extremity General Exercises - Upper Extremity Shoulder Flexion: PROM;Both;5 reps;Supine(Difficul due to pain with flexion) Shoulder ABduction: PROM;Both;5 reps;Supine Shoulder ADduction: PROM;Both;5 reps;Supine Elbow Flexion: PROM;Both;10 reps;Supine Elbow Extension: PROM;Both;10 reps;Supine Wrist Flexion: PROM;Both;10 reps;Supine Wrist Extension: PROM;Both;10 reps;Supine Composite Extension: PROM;10 reps;Left;Supine(cues to "relax")   Shoulder Instructions      Home Living Family/patient expects to be discharged to:: Unsure                                        Prior Functioning/Environment Level of Independence: Needs assistance        Comments: Per chart review, pt bedbound at baseline.         OT Problem List: Decreased strength;Decreased range of motion;Decreased activity tolerance;Impaired balance (sitting and/or standing);Decreased knowledge of use of DME or AE;Decreased knowledge of precautions;Decreased safety awareness;Decreased cognition;Decreased coordination;Impaired vision/perception;Pain;Increased edema;Impaired UE functional use      OT Treatment/Interventions: Self-care/ADL training;Therapeutic exercise;Energy conservation;DME and/or AE instruction;Neuromuscular education;Patient/family education;Therapeutic activities    OT Goals(Current goals can be found in the care plan section) Acute Rehab OT Goals Patient Stated Goal: unable OT Goal Formulation: Patient unable to participate in goal setting Time For Goal Achievement: 10/24/18 Potential to Achieve Goals: Good  OT Frequency: Min 2X/week   Barriers to D/C:            Co-evaluation PT/OT/SLP Co-Evaluation/Treatment: Yes Reason for Co-Treatment: For patient/therapist safety;Complexity of  the patient's impairments (multi-system involvement)   OT goals addressed during session: ADL's and self-care;Strengthening/ROM      AM-PAC OT "6 Clicks" Daily Activity     Outcome Measure Help from another person eating meals?: Total Help from another person taking care of personal grooming?: Total Help from another person toileting, which includes using toliet, bedpan, or urinal?: Total Help from another person bathing (including washing, rinsing, drying)?: Total Help from another person to put on and taking off regular upper body clothing?: Total Help from another person to put on and taking off regular lower body clothing?: Total 6 Click Score: 6   End of Session Equipment Utilized During Treatment: Other (comment)(Intubated) Nurse Communication: Mobility status  Activity Tolerance: Patient limited by lethargy;Patient limited by pain;Treatment limited secondary to medical complications (Comment) Patient left: in bed;with call bell/phone within reach;with bed alarm set  OT Visit Diagnosis: Unsteadiness on feet (R26.81);Other abnormalities of gait and mobility (R26.89);Muscle weakness (generalized) (M62.81);Other symptoms and signs involving cognitive function;Pain;Hemiplegia and hemiparesis Hemiplegia - Right/Left: Right Hemiplegia - dominant/non-dominant: Dominant Hemiplegia - caused by: Cerebral infarction Pain - Right/Left: (Bilateral with flexion) Pain - part of body: Shoulder                Time:  [id number] OT Time Calculation (min): 18 min Charges:  OT General Charges $OT Visit: 1 Visit OT Evaluation $OT Eval High Complexity: 1 High  Tymon Nemetz MSOT, OTR/L Acute Rehab Pager: [phone] Office: [phone]  Noah GristCharis M Francisco Bush 10/10/2018, 1:51 PM

## 2018-10-10 NOTE — Progress Notes (Signed)
Carotid duplex completed. Results in Chart review CV Proc 5/10.2020, 3:04 PM [employer], RVS 10/10/2018, 3:06 PM

## 2018-10-10 NOTE — Consult Note (Addendum)
Palliative Care Consult Note  Reason for consult: Goals of care in light of ventilator dependent respiratory failure following CVA  Palliative medicine consult received. Chart reviewed including personal review of pertinent labs and imaging.  Discussed case with bedside RN.  Briefly, Noah Bush is a 78 year old gentleman with past medical history of R MCA CVA, residual left hemiparesis, bedbound at baseline, DM, HTN, and dementia who is followed by outpatient palliative care.  He presented to ED following acute onset weakness and mental status change.  Was noted at home to be DNR/DNI, but this was reversed by family after presentation to emergency room and he was subsequently intubated for airway protection.  Workup has revealed new acute L basal ganglia infarct and chronic small left cerebellar infarct.  Palliative consulted for goals of care.  I saw and examined Noah Bush.  Lying in bed with eyes open, noted L side movement, but does not follow commands.   I attempted to call wife but no answer.  I was able to reach his son, Noah Bush.  His son reports that he lives near parents and helps care for them.    We discussed clinical course and reviewed that his father had, in fact, had a new stroke and was now ventilator dependent.  Noah Bush was not surprised by this.  He acknowledges that his father was DNR prior to admission, but that his mother felt "we needed to give him a chance" when called and asked about care wishes in the ER at the time they consented to intubation.  We began discussion on difference between a aggressive medical intervention path and a palliative, comfort focused care path, but I did not push far with conversation today as Noah Bush reports needing time to discuss with his mother.   Questions and concerns addressed.   PMT will continue to support holistically.  - Initial conversation with son regarding clinical situation and goals moving forward.  Reviewed severity of his situation  and concern that he may never be able to support himself off of ventilator.  He reports family would like to see how he progresses over next couple of days and he needs time to discuss with his mother, who would be patient's legal surrogate.  PMT to follow-up tomorrow.  Total time: 80 minutes Greater than 50%  of this time was spent counseling and coordinating care related to the above assessment and plan.  Romie Minus, MD [facility] Palliative Medicine Team [phone]

## 2018-10-10 NOTE — Progress Notes (Signed)
NAME:  Noah RumpfJames Conerly Jr., MRN:  [id number], DOB:  03-Oct-1940, LOS: 2 ADMISSION DATE:  10/21/2018, CONSULTATION DATE:  10/31/2018 REFERRING MD:  Dr. Otelia LimesLindzen, CHIEF COMPLAINT:  AMS   Brief History   78 y/o M who [facility] with right sided weakness, LSN 1105.  CT head without acute hemorrhage or left sided hypodensity.  tPA administered. Intubated for AMS/poor airway protection.  History of present illness   78 y/o M who [facility] with reports of acute onset right sided weakness.  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  He was last seen at his baseline at 1105 am.  He reportedly has been seen by Palliative Care at home and was made a DNR.    EMS found him altered and hypoxic with saturations of 81%, placed on NRB. On arrival to the ER, he was noted to have right hemiplegia, leftward gaze, not speaking, given tPA.   CODE STROKE activated and patient's CT showed no acute hemorrhage or left sided hypodensity. Old right MCA territory ischemic infarct has progressed to encephalomalacia.  Initial labs- Na 142, K 4.3, Cl 107, CO2 23, glucose 93, BUN 24, sr cr 1.45, WBC 7.9, Hgb 11.8 and platelets 219.  CXR was negative. The patient was intubated for airway protection.  COVID testing negative.  PCCM called for ICU admission.   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  On plavix / ASA at baseline.  Alzheimer's Dementia  CKD  DM  HTN  UTI with prior E-Coli ACD  Significant Hospital Events   5/8 admit  Consults:  PCCM  Procedures:  5/8 ETT  Significant Diagnostic Tests:  CT Head 5/8 >> no acute left brain abnormality, old right MCA territory CVA that progressed to encephalomalacia  MRI/MRA brain 5/9: 1. 4 cm acute left basal  ganglia infarct with small volume petechial Hemorrhage. 2. Large chronic right MCA infarct and moderate chronic small vessel ischemic disease. 3. Small chronic left cerebellar infarct, new from 07/2017. 4. 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  TTE 5/9: EF 25-30%; diastolic dysfunction; neg bubble/septal defect.  Micro Data:  Covid neg  Antimicrobials:  n/a   Interim history/subjective:  No overnight events. Pt remains intubated, no obvious effort to breath over vent. spont movement of L side  Objective   Blood pressure (!) 110/56, pulse 65, temperature 98.8 F (37.1 C), temperature source Axillary, resp. rate 16, height 5\' 11"  (1.803 m), weight 92.4 kg, SpO2 100 %.    Vent Mode: PRVC FiO2 (%):  [40 %] 40 % Set Rate:  [16 bmp] 16 bmp Vt Set:  [600 mL] 600 mL PEEP:  [5 cmH20] 5 cmH20 Plateau Pressure:  [15 cmH20-18 cmH20] 16 cmH20   Intake/Output Summary (Last 24 hours) at 10/10/2018 [id number] Last data filed at 10/10/2018 0800 Gross per 24 hour  Intake 3817.6 ml  Output 700 ml  Net 3117.6 ml   Filed Weights   03/12/19 1252 10/10/18 0500  Weight: 88.3 kg 92.4 kg    Examination: General: NAD HENT: AT/[place]; PERRLA Lungs: CTAB Cardiovascular: RRR no r/m/g; tele demonstrates PVCs fairly consistently Abdomen: SND Extremities: right forearm edema near previous  IV placement/attempt site. nonerythematous or indurated, improved today (5/10) Neuro: w/d to stim L UE and LLE; RLE w/d to stim. Does not respond to requests/commands PERRLA. GU: UOP adequate, foley  Resolved Hospital Problem list   None to date  Assessment & Plan:  Prev. R MCA stroke with L hemiparesis, now right sided weakness, s/p TPA. L GCS still low, no effort to breathe over ventilator  Hypoxia/airway protection: Patient intubated, no apparent effort to breathe over vent PLAN: continue mechanical  ventilation, wean as able  Neuro: continue to follow exam for any improvement; given lack of clarity re: DNR and family goals of care at this point, will ask palliative care to coordinate previous wishes.  Awaiting palliative care consult  F/E/N:  PLAN: --Replete lytes as needed --Continue TF --DM: SSI   Best practice:  Diet: tube feeds per G-tube Pain/Anxiety/Delirium protocol (if indicated):  VAP protocol (if indicated): HOB 30 DVT prophylaxis: SCDs GI prophylaxis: PPI Glucose control: SSI Mobility: bed Code Status: TBD.Marland Kitchenlisted full for now but apparently palliative care at facility DNR? Family Communication: no family at bedside Disposition: 4N ICU  Labs   CBC: Recent Labs  Lab 10/07/2018 1242 10/09/18 0436 10/10/18 0258  WBC 7.9 8.5 9.4  NEUTROABS 3.4  --   --   HGB 11.8* 11.1* 9.9*  HCT 35.9* 33.3* 30.1*  MCV 83.3 82.6 82.7  PLT 219 230 187    Basic Metabolic Panel: Recent Labs  Lab 10/22/2018 1242 10/29/2018 1245 10/07/2018 1657 10/09/18 0436 10/09/18 1615 10/10/18 0258  NA 142  --   --  142  --  143  K 4.3  --   --  4.3  --  4.3  CL 107  --   --  109  --  115*  CO2 23  --   --  21*  --  19*  GLUCOSE 93  --   --  124*  --  127*  BUN 24*  --   --  31*  --  37*  CREATININE 1.45* 1.40*  --  1.26*  --  1.32*  CALCIUM 10.0  --   --  9.1  --  8.8*  MG  --   --  1.8 1.6* 1.9 1.9  PHOS  --   --  4.6 3.1 2.8  --    GFR: Estimated Creatinine Clearance: 54.4 mL/min (A) (by C-G formula based on SCr of 1.32 mg/dL (H)). Recent Labs  Lab 10/13/2018 1242 10/09/18 0436 10/10/18 0258  WBC 7.9 8.5 9.4    Liver Function Tests: Recent Labs  Lab 10/01/2018 1242 10/10/18 0258  AST 17 16  ALT 13 10  ALKPHOS 53 45  BILITOT 0.5 0.6  PROT 7.0 5.5*  ALBUMIN 3.3* 2.6*   No results for input(s): LIPASE, AMYLASE in the last 168 hours. No results for input(s): AMMONIA in the last 168 hours.  ABG    Component Value Date/Time   PHART 7.404 10/18/2018 1458   PCO2ART  39.2 10/12/2018 1458   PO2ART 477 (H) 10/09/2018 1458   HCO3 24.0 10/23/2018 1458   TCO2 20 (L) 07/23/2017 1334   ACIDBASEDEF 0.1 10/20/2018 1458   O2SAT 99.4 10/21/2018 1458     Coagulation Profile: No results for input(s): INR, PROTIME in the last 168 hours.  Cardiac Enzymes: No results for input(s): CKTOTAL, CKMB, CKMBINDEX, TROPONINI in the last 168 hours.  HbA1C: Hemoglobin A1C  Date/Time Value Ref Range Status  03/06/2013 01:33 AM 6.6 (H) 4.2 - 6.3 % Final  Comment:    The American Diabetes Association recommends that a primary goal of therapy should be <7% and that physicians should reevaluate the treatment regimen in patients with HbA1c values consistently >8%.   01/16/2013 04:39 AM 7.7 (H) 4.2 - 6.3 % Final    Comment:    The American Diabetes Association recommends that a primary goal of therapy should be <7% and that physicians should reevaluate the treatment regimen in patients with HbA1c values consistently >8%.    Hgb A1c MFr Bld  Date/Time Value Ref Range Status  10/09/2018 04:36 AM 6.4 (H) 4.8 - 5.6 % Final    Comment:    (NOTE) Pre diabetes:          5.7%-6.4% Diabetes:              >6.4% Glycemic control for   <7.0% adults with diabetes   07/24/2017 05:46 AM 6.9 (H) 4.8 - 5.6 % Final    Comment:    (NOTE) Pre diabetes:          5.7%-6.4% Diabetes:              >6.4% Glycemic control for   <7.0% adults with diabetes     CBG: Recent Labs  Lab 10/09/18 1551 10/09/18 1941 10/09/18 2334 10/10/18 0335 10/10/18 0738  GLUCAP 85 106* 96 109* 110*    Review of Systems:     Past Medical History  He,  has a past medical history of Alzheimer's dementia (HCC), Cerebral infarction (HCC) (07/27/2017), Cerebrovascular accident (CVA) (HCC) (07/23/2017), CKD (chronic kidney disease), stage III (HCC), Diabetes mellitus without complication (HCC), Gout, and Hypertension.   Surgical History    Past Surgical History:  Procedure Laterality Date    COLONOSCOPY WITH PROPOFOL N/A 05/09/2015   Procedure: COLONOSCOPY WITH PROPOFOL;  Surgeon: [name], MD;  Location[facility] ENDOSCOPY;  Service: Endoscopy;  Laterality: N/A;   IR GASTROSTOMY TUBE MOD SED  08/13/2017   TIBIA FRACTURE SURGERY Left      Social History   reports that he has never smoked. He has never used smokeless tobacco. He reports current alcohol use of about 2.0 standard drinks of alcohol per week. He reports that he does not use drugs.   Family History   His family history includes Diabetes in his paternal grandfather.   Allergies No Known Allergies   Home Medications  Prior to Admission medications   Medication Sig Start Date End Date Taking? Authorizing Provider  acetaminophen (TYLENOL) 325 MG tablet Take 1-2 tablets (325-650 mg total) by mouth every 4 (four) hours as needed for mild pain. 08/24/17  Yes Love, Evlyn Kanner, PA-C  aspirin EC 81 MG tablet Take 81 mg by mouth daily.   Yes [provider]  atorvastatin (LIPITOR) 40 MG tablet Take 40 mg by mouth at bedtime. 10/01/18  Yes [provider]  clopidogrel (PLAVIX) 75 MG tablet Take 1 tablet (75 mg total) by mouth daily. 08/25/17  Yes Love, Evlyn Kanner, PA-C  latanoprost (XALATAN) 0.005 % ophthalmic solution Place 1 drop into the left eye at bedtime. 08/24/17  Yes Love, Evlyn Kanner, PA-C  memantine (NAMENDA) 5 MG tablet Take 5 mg by mouth daily.   Yes [provider]  metoprolol succinate (TOPROL-XL) 25 MG 24 hr tablet Take 0.5 tablets (12.5 mg total) by mouth daily. Patient taking differently: Take 12.5 mg by mouth 2 (two) times a day.  07/29/17  Yes Garnette Gunner, MD  multivitamin (RENA-VIT) TABS tablet Take 1 tablet  by mouth at bedtime. 08/24/17  Yes Love, Evlyn Kanner, PA-C  Nutritional Supplements (FEEDING SUPPLEMENT, OSMOLITE 1.5 CAL,) LIQD Place 1,000 mLs into feeding tube daily. Patient taking differently: Take 1,000 mLs by mouth daily.  01/03/18  Yes Houston Siren, MD  tamsulosin  (FLOMAX) 0.4 MG CAPS capsule Take 0.4 mg by mouth daily. 10/01/18  Yes [provider]  albuterol (PROVENTIL) (2.5 MG/3ML) 0.083% nebulizer solution Take 3 mLs (2.5 mg total) by nebulization every 6 (six) hours as needed for wheezing or shortness of breath. Patient not taking: Reported on 10/14/2018 08/24/17   Love, Evlyn Kanner, PA-C  Amino Acids-Protein Hydrolys (FEEDING SUPPLEMENT, PRO-STAT SUGAR FREE 64,) LIQD Place 30 mLs into feeding tube 3 (three) times daily between meals. Patient not taking: Reported on 10/31/2018 08/24/17   Love, Evlyn Kanner, PA-C  atorvastatin (LIPITOR) 80 MG tablet Take 1 tablet (80 mg total) by mouth daily at 6 PM. Patient not taking: Reported on 10/10/2018 07/28/17   Garnette Gunner, MD  Menthol-Methyl Salicylate (MUSCLE RUB) 10-15 % CREA Apply 1 application topically 4 (four) times daily. To bilateral hands and bilateral knees Patient not taking: Reported on 10/29/2018 08/24/17   Love, Evlyn Kanner, PA-C  methylphenidate (RITALIN) 5 MG tablet Take 1 tablet (5 mg total) by mouth 2 (two) times daily with breakfast and lunch. Patient not taking: Reported on 10/20/2018 08/24/17   Love, Evlyn Kanner, PA-C  Water For Irrigation, Sterile (FREE WATER) SOLN Place 150 mLs into feeding tube 4 (four) times daily. 01/03/18   Houston Siren, MD     Critical care time:  I have independently seen and examined the patient, reviewed data, and developed an assessment and plan. A total of 36 minutes were spent in critical care assessment and medical decision making. This critical care time does not reflect procedure time, or teaching time or supervisory time of PA/NP/Med student/Med Resident, etc but could involve care discussion time.   Gwynne Edinger, MD PhD   10/10/18 9:23 AM

## 2018-10-10 NOTE — Evaluation (Addendum)
Physical Therapy Evaluation Patient Details Name: Noah Bush. MRN: [id number] DOB: 10/27/1940 Today's Date: 10/10/2018   History of Present Illness  78 yo male presenting with right side weakness. tPA administered on 5/8. MRI showing left basal ganglia infarct, small chronic left cerebellar infarct, and chronic right MCA. Intubated on 5/8. PMH including left hemiparesis s/p R CVA, Alzheimer's Dementia, CKD, DM, and HTN.  Clinical Impression  Pt admitted with above diagnosis. Pt currently with functional limitations due to the deficits listed below (see PT Problem List). Prior to admission, pt bed bound per chart review with left hemiplegia from prior stroke. Pt evaluated on vent, 40% FiO2 5 PEEP. Presenting with decreased mobility secondary to new right hemiparesis, chronic left hemiplegia, decreased ROM, decreased cognition. Displays left gaze preference and not visually tracking therapist in room. Not following simple commands. Was responsive to pain, grimacing with right shoulder range of motion. Will follow acutely for range of motion and positioning.      Follow Up Recommendations SNF [facility]    Equipment Recommendations  None recommended by PT    Recommendations for Other Services       Precautions / Restrictions Precautions Precautions: Fall Precaution Comments: baseline L hemiplegia, new R hemiparesis Restrictions Weight Bearing Restrictions: No      Mobility  Bed Mobility Overal bed mobility: Needs Assistance Bed Mobility: Rolling Rolling: Total assist;+2 for physical assistance         General bed mobility comments: TotalA + 2 for turning towards left for wedge placement  Transfers                    Ambulation/Gait                Stairs            Wheelchair Mobility    Modified Rankin (Stroke Patients Only) Modified Rankin (Stroke Patients Only) Pre-Morbid Rankin Score: Severe disability Modified Rankin: Severe disability      Balance                                             Pertinent Vitals/Pain Pain Assessment: Faces Faces Pain Scale: Hurts even more Pain Location: grimacing with passive right shoulder flexion Pain Descriptors / Indicators: Grimacing Pain Intervention(s): Monitored during session;Repositioned    Home Living Family/patient expects to be discharged to:: Unsure                      Prior Function Level of Independence: Needs assistance         Comments: Per chart review, pt bedbound at baseline.      Hand Dominance        Extremity/Trunk Assessment   Upper Extremity Assessment Upper Extremity Assessment: Defer to OT evaluation    Lower Extremity Assessment Lower Extremity Assessment: RLE deficits/detail;LLE deficits/detail RLE Deficits / Details: No active movement noted. PROM WFL LLE Deficits / Details: L hemiplegia from prior stroke. No active movement noted. L knee extension and plantarflexion contracture       Communication   Communication: Other (comment)(nonverbal, intubated)  Cognition Arousal/Alertness: Awake/alert Behavior During Therapy: Flat affect Overall Cognitive Status: Impaired/Different from baseline Area of Impairment: Following commands                       Following Commands: (not following commands)  General Comments: Pt awake, not following any commands. Responsive to pain.       General Comments      Exercises     Assessment/Plan    PT Assessment Patient needs continued PT services  PT Problem List Decreased strength;Decreased range of motion;Decreased mobility;Decreased cognition;Pain;Impaired tone;Impaired sensation       PT Treatment Interventions Therapeutic activities;Therapeutic exercise;Neuromuscular re-education;Patient/family education    PT Goals (Current goals can be found in the Care Plan section)  Acute Rehab PT Goals Patient Stated Goal: unable PT Goal Formulation:  Patient unable to participate in goal setting Time For Goal Achievement: 10/24/18 Potential to Achieve Goals: Poor    Frequency Min 2X/week   Barriers to discharge        Co-evaluation               AM-PAC PT "6 Clicks" Mobility  Outcome Measure Help needed turning from your back to your side while in a flat bed without using bedrails?: Total Help needed moving from lying on your back to sitting on the side of a flat bed without using bedrails?: Total Help needed moving to and from a bed to a chair (including a wheelchair)?: Total Help needed standing up from a chair using your arms (e.g., wheelchair or bedside chair)?: Total Help needed to walk in hospital room?: Total Help needed climbing 3-5 steps with a railing? : Total 6 Click Score: 6    End of Session Equipment Utilized During Treatment: Oxygen(vent) Activity Tolerance: Patient tolerated treatment well Patient left: in bed;with call bell/phone within reach;with restraints reapplied;with SCD's reapplied Nurse Communication: Mobility status PT Visit Diagnosis: Other abnormalities of gait and mobility (R26.89);Other symptoms and signs involving the nervous system (R29.898);Hemiplegia and hemiparesis Hemiplegia - Right/Left: Right Hemiplegia - dominant/non-dominant: Dominant Hemiplegia - caused by: Cerebral infarction    Time: [id number] PT Time Calculation (min) (ACUTE ONLY): 18 min   Charges:   PT Evaluation $PT Eval High Complexity: 1 High          Laurina Bustlearoline Tyisha Cressy, PT, DPT Acute Rehabilitation Services Pager [phone] Office [phone]   Vanetta MuldersCarloine H Takyla Kuchera 10/10/2018, 12:36 PM

## 2018-10-11 DIAGNOSIS — I6300 Cerebral infarction due to thrombosis of unspecified precerebral artery: Secondary | ICD-10-CM

## 2018-10-11 DIAGNOSIS — J988 Other specified respiratory disorders: Secondary | ICD-10-CM

## 2018-10-11 LAB — GLUCOSE, CAPILLARY
Glucose-Capillary: 101 mg/dL — ABNORMAL HIGH (ref 70–99)
Glucose-Capillary: 81 mg/dL (ref 70–99)
Glucose-Capillary: 86 mg/dL (ref 70–99)
Glucose-Capillary: 86 mg/dL (ref 70–99)
Glucose-Capillary: 89 mg/dL (ref 70–99)
Glucose-Capillary: 96 mg/dL (ref 70–99)
Glucose-Capillary: 99 mg/dL (ref 70–99)

## 2018-10-11 LAB — CBC
HCT: 29.8 % — ABNORMAL LOW (ref 39.0–52.0)
Hemoglobin: 9.7 g/dL — ABNORMAL LOW (ref 13.0–17.0)
MCH: 27.2 pg (ref 26.0–34.0)
MCHC: 32.6 g/dL (ref 30.0–36.0)
MCV: 83.5 fL (ref 80.0–100.0)
Platelets: 197 10*3/uL (ref 150–400)
RBC: 3.57 MIL/uL — ABNORMAL LOW (ref 4.22–5.81)
RDW: 16.9 % — ABNORMAL HIGH (ref 11.5–15.5)
WBC: 9.6 10*3/uL (ref 4.0–10.5)
nRBC: 0 % (ref 0.0–0.2)

## 2018-10-11 LAB — BASIC METABOLIC PANEL
Anion gap: 11 (ref 5–15)
BUN: 35 mg/dL — ABNORMAL HIGH (ref 8–23)
CO2: 18 mmol/L — ABNORMAL LOW (ref 22–32)
Calcium: 8.7 mg/dL — ABNORMAL LOW (ref 8.9–10.3)
Chloride: 116 mmol/L — ABNORMAL HIGH (ref 98–111)
Creatinine, Ser: 1.11 mg/dL (ref 0.61–1.24)
GFR calc Af Amer: >60 mL/min (ref >60)
GFR calc non Af Amer: >60 mL/min (ref >60)
Glucose, Bld: 102 mg/dL — ABNORMAL HIGH (ref 70–99)
Potassium: 4.7 mmol/L (ref 3.5–5.1)
Sodium: 145 mmol/L (ref 135–145)

## 2018-10-11 LAB — MAGNESIUM: Magnesium: 1.8 mg/dL (ref 1.7–2.4)

## 2018-10-11 MED ORDER — FENTANYL CITRATE (PF) 100 MCG/2ML IJ SOLN
25.0000 ug | INTRAMUSCULAR | Status: DC | PRN
  Administered 2018-10-12: 25 ug via INTRAVENOUS
  Administered 2018-10-12: 50 ug via INTRAVENOUS
  Filled 2018-10-11 (×2): qty 2

## 2018-10-11 MED ORDER — ASPIRIN 81 MG PO CHEW
81.0000 mg | CHEWABLE_TABLET | Freq: Every day | ORAL | Status: DC
  Administered 2018-10-11 – 2018-10-14 (×4): 81 mg
  Filled 2018-10-11 (×5): qty 1

## 2018-10-11 MED ORDER — PANTOPRAZOLE SODIUM 40 MG PO PACK
40.0000 mg | PACK | Freq: Every day | ORAL | Status: DC
  Administered 2018-10-11 – 2018-10-12 (×2): 40 mg
  Filled 2018-10-11 (×2): qty 20

## 2018-10-11 NOTE — Progress Notes (Signed)
NAME:  Noah RumpfJames Mecum Jr., MRN:  [id number], DOB:  1941/05/26, LOS: 3 ADMISSION DATE:  10/20/2018, CONSULTATION DATE:  10/21/2018 REFERRING MD:  Dr. Otelia LimesLindzen, CHIEF COMPLAINT:  AMS   Brief History   78 y/o M who [facility] with right sided weakness, LSN 1105.  CT head without acute hemorrhage or left sided hypodensity.  tPA administered. Intubated for AMS/poor airway protection.  Was seen by palliative care as outpt prior to admission and was DNR.  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  On plavix / ASA at baseline.  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 TTE 5/9 >> EF 25-30%; diastolic dysfunction; neg bubble/septal defect.  Micro Data:  Covid 5/08 >> negative  Antimicrobials:  n/a   Interim history/subjective:  Remains on vent.  No sedation since 5/09.  Objective   Blood pressure 105/60, pulse (!) 54, temperature 98.3 F (36.8 C), temperature source Axillary, resp. rate 11, height 5\' 11"  (1.803 m), weight 93.8 kg, SpO2 100 %.    Vent Mode: PRVC FiO2 (%):  [40 %] 40 % Set Rate:  [16 bmp] 16 bmp Vt Set:  [600 mL] 600 mL PEEP:  [5 cmH20] 5 cmH20 Plateau Pressure:  [15 cmH20-18 cmH20] 15 cmH20   Intake/Output Summary (Last 24 hours) at 10/11/2018 0815 Last data filed at 10/11/2018 0600 Gross per 24 hour  Intake 1922.08 ml  Output 1650 ml  Net 272.08 ml   Filed Weights   01-14-19 1252 10/10/18 0500 10/11/18  0500  Weight: 88.3 kg 92.4 kg 93.8 kg    Examination:  General - on vent Eyes - pupils reactive ENT - ETT in place Cardiac - irregular, no murmur Chest - equal breath sounds b/l, no wheezing or rales Abdomen - soft, non tender, + bowel sounds Extremities - 1+ edema Skin - no rashes Neuro - doesn't follow commands  Resolved Hospital Problem list     Assessment & Plan:   Acute hypoxic respiratory failure with compromised airway in setting of CVA. Plan - full vent support - mental status will be barrier to extubation - if family wishes to continue aggressive care, then he will need trach - f/u CXR intermittently  Acute Lt BG infarct s/p tPA. Hx of Rt MCA and Lt cerebellar infarcts. Alzheimer's dementia. Plan - per neurology  A fib with slow heart rate >> undetermined if new or old. Systolic CHF >> undetermined if new or old. Hx of HTN, HLD. Plan - goal SBP < 180 - continue lipitor  DM type II. Plan - SSI  Chronic dysphagia. Plan - tube feeds via PEG  Goals of care. Plan - palliative care consulted - was DNR prior to admission - long term prognosis for meaningful recover seems quite poor   Best practice:  Diet: tube feeds DVT prophylaxis: SCDs GI prophylaxis: protonix Mobility: bed rest Code Status: full code Disposition: ICU  Labs    CMP Latest Ref Rng &  Units 10/10/2018 10/09/2018 2018-11-01  Glucose 70 - 99 mg/dL 536(I) 680(H) -  BUN 8 - 23 mg/dL 21(Y) 24(M) -  Creatinine 0.61 - 1.24 mg/dL 2.50(I) 3.70(W) 8.88(B)  Sodium 135 - 145 mmol/L 143 142 -  Potassium 3.5 - 5.1 mmol/L 4.3 4.3 -  Chloride 98 - 111 mmol/L 115(H) 109 -  CO2 22 - 32 mmol/L 19(L) 21(L) -  Calcium 8.9 - 10.3 mg/dL 1.6(X) 9.1 -  Total Protein 6.5 - 8.1 g/dL 4.5(W) - -  Total Bilirubin 0.3 - 1.2 mg/dL 0.6 - -  Alkaline Phos 38 - 126 U/L 45 - -  AST 15 - 41 U/L 16 - -  ALT 0 - 44 U/L 10 - -   CBC Latest Ref Rng & Units 10/11/2018 10/10/2018 10/09/2018  WBC 4.0 - 10.5 K/uL 9.6 9.4  8.5  Hemoglobin 13.0 - 17.0 g/dL 3.8(U) 8.2(C) 11.1(L)  Hematocrit 39.0 - 52.0 % 29.8(L) 30.1(L) 33.3(L)  Platelets 150 - 400 K/uL 197 187 230   ABG    Component Value Date/Time   PHART 7.404 November 01, 2018 1458   PCO2ART 39.2 2018-11-01 1458   PO2ART 477 (H) November 01, 2018 1458   HCO3 24.0 2018-11-01 1458   TCO2 20 (L) 07/23/2017 1334   ACIDBASEDEF 0.1 2018-11-01 1458   O2SAT 99.4 11-01-2018 1458   CBG (last 3)  Recent Labs    10/11/18 0036 10/11/18 0421 10/11/18 0812  GLUCAP 89 81 96   CC time 32 minutes  Coralyn Helling, MD [facility] Pulmonary/Critical Care 10/11/2018, 8:33 AM

## 2018-10-11 NOTE — Progress Notes (Signed)
Palliative Care Progress Note  Reason for consult: Goals of care in light of ventilator dependent respiratory failure following CVA  I saw and examined Noah Bush.  Lying in bed with eyes open, noted L side movement, but not consistent and does not follow commands.   I called and was able to reach his wife.  She reports understanding the severity of his condition and that he has had another major stroke and is on life support.  We discussed his clinical course both prior to hospitalization and since admission in detail.  Discussed options for care, including continuation of aggressive interventions vs refocusing care on comfort.  Discussed that he is now at a point where consideration would be for tracheostomy or consideration of one way extubation.  She states that he was clear that he did not want CPR, and she states that he would not want to be maintained on life support long term.  She feels that one way extubation would be his desire if he were to understand his situation.  She will discuss with her son this evening.  Questions and concerns addressed. PMT will continue to support holistically.  - Discussed with patient's wife today.  She is clear that he would not want CPR and was in agreement with changing code status to DNR. - Reviewed severity of his situation and concern for tracheostomy vs transition to comfort with plan for one way extubation.  She reports understanding situation and that he would not want to pursue tracheostomy. - She will speak with her son about timing of coming to the hospital with plan for one way extubation. - I am going off service, but will have a member of PMT follow-up tomorrow with family to discuss plan for one way extubation in the next day or two.  Total time: 50 minutes Greater than 50% of this time was spent counseling and coordinating care related to the above assessment and plan.  Noah Minus, MD [facility] Palliative Medicine  Team [phone]

## 2018-10-11 NOTE — Progress Notes (Signed)
SLP Cancellation Note  Patient Details Name: Noah Bush. MRN: [id number] DOB: March 19, 1941   Cancelled treatment:        On vent. Will follow.   Royce Macadamia 10/11/2018, 8:25 AM  Breck Coons Lonell Face.Ed Nurse, children's [phone] Office [phone]

## 2018-10-11 NOTE — Progress Notes (Addendum)
Changes in cardiac rhythm noted. Paged Mikey Bussing NP and Amada Jupiter MD. Orders for labs and 12 lead EKG.

## 2018-10-11 NOTE — Progress Notes (Signed)
   10/11/18 1400  Clinical Encounter Type  Visited With Patient  Visit Type Initial  Referral From Nurse  Consult/Referral To Chaplain  Spiritual Encounters  Spiritual Needs Emotional;Prayer;Literature  Stress Factors  Family Stress Factors Health changes;Major life changes   Responded to spiritual care consult. Nurse paged Chaplain per Wife request. Wife's request comes from recent comfort measures for pt and she was requesting a pastoral presence.  PT was not able to communicate. I offered spiritual care with ministry of presence, words of comfort, sacred text, and prayer. Chaplain available as needed.  Chaplain Orest Dikes [phone]

## 2018-10-11 NOTE — Progress Notes (Signed)
STROKE TEAM PROGRESS NOTE   SUBJECTIVE (INTERVAL HISTORY) He remains critically ill with respiratory failure and intubated and neurologically unresponsive with quadriparesis.  Blood pressure adequately controlled.  OBJECTIVE Vitals:   10/11/18 0600 10/11/18 0700 10/11/18 0800 10/11/18 0828  BP: 118/79 105/60 (!) 107/54 (!) 107/54  Pulse: 76 (!) 54 (!) 33 (!) 52  Resp: Temp:   98.9 F (37.2 C)   TempSrc:   Oral   SpO2: 96% 100% 100% 100%  Weight:      Height:        CBC:  Recent Labs  Lab 11/04/18 1242  10/10/18 0258 10/11/18 0514  WBC 7.9   < > 9.4 9.6  NEUTROABS 3.4  --   --   --   HGB 11.8*   < > 9.9* 9.7*  HCT 35.9*   < > 30.1* 29.8*  MCV 83.3   < > 82.7 83.5  PLT 219   < > 187 197   < > = values in this interval not displayed.    Basic Metabolic Panel:  Recent Labs  Lab 10/09/18 0436 10/09/18 1615 10/10/18 0258  NA 142  --  143  K 4.3  --  4.3  CL 109  --  115*  CO2 21*  --  19*  GLUCOSE 124*  --  127*  BUN 31*  --  37*  CREATININE 1.26*  --  1.32*  CALCIUM 9.1  --  8.8*  MG 1.6* 1.9 1.9  PHOS 3.1 2.8  --     IMAGING past 24h Carotid (at [facility])  Result Date: 10/10/2018 Carotid Arterial Duplex Study Indications:   CVA and Weakness. Acute right sided weakness Risk Factors:  Hypertension, Diabetes, prior CVA. Other Factors: H/O stroke with residual left sided weakness.  Examination Guidelines: A complete evaluation includes B-mode imaging, spectral Doppler, color Doppler, and power Doppler as needed of all accessible portions of each vessel. Bilateral testing is considered an integral part of a complete examination. Limited examinations for reoccurring indications may be performed as noted.  Right Carotid Findings: +----------+--------+--------+--------+---------------------+------------------+           PSV cm/sEDV cm/sStenosisDescribe             Comments            +----------+--------+--------+--------+---------------------+------------------+ CCA Prox  66      9                                    mild intimal                                                              changes            +----------+--------+--------+--------+---------------------+------------------+ CCA Distal52      7               heterogenous         mild intimal  changes and mild                                                          plaque             +----------+--------+--------+--------+---------------------+------------------+ ICA Prox  45      12      1-39%   irregular and        mild to moderate                                     heterogenous         plaque             +----------+--------+--------+--------+---------------------+------------------+ ICA Mid   83      17                                                      +----------+--------+--------+--------+---------------------+------------------+ ICA Distal62      19                                                      +----------+--------+--------+--------+---------------------+------------------+ ECA       93      0               heterogenous         mild plaque        +----------+--------+--------+--------+---------------------+------------------+ +----------+--------+-------+--------+-------------------+           PSV cm/sEDV cmsDescribeArm Pressure (mmHG) +----------+--------+-------+--------+-------------------+ [id number]                                         +----------+--------+-------+--------+-------------------+ +---------+--------+--+--------+--+ VertebralPSV cm/s58EDV cm/s20 +---------+--------+--+--------+--+  Left Carotid Findings: +----------+--------+--------+--------+--------------------+-------------------+           PSV cm/sEDV cm/sStenosisDescribe             Comments            +----------+--------+--------+--------+--------------------+-------------------+ CCA Prox  65      10              heterogenous        mild plaque         +----------+--------+--------+--------+--------------------+-------------------+ CCA Distal68      9               heterogenous        mild plaque         +----------+--------+--------+--------+--------------------+-------------------+ ICA Prox  53      19      1-39%   heterogenous and    mild plaque                                           irregular                               +----------+--------+--------+--------+--------------------+-------------------+  ICA Mid   50      16                                  tortuous            +----------+--------+--------+--------+--------------------+-------------------+ ICA Distal56      19                                  tortuous            +----------+--------+--------+--------+--------------------+-------------------+ ECA       50      9                                   difficult to                                                              visualize due to                                                          high bifurcation    +----------+--------+--------+--------+--------------------+-------------------+ +----------+--------+--------+--------------+-------------------+ SubclavianPSV cm/sEDV cm/sDescribe      Arm Pressure (mmHG) +----------+--------+--------+--------------+-------------------+                           Not identified                    +----------+--------+--------+--------------+-------------------+ +---------+--------+--------+--------------+ VertebralPSV cm/sEDV cm/sNot identified +---------+--------+--------+--------------+ Unable to visualize the verebral and sbclavian arteries due to inability to turn the neck and the clavicle.  Summary: Right Carotid: Velocities in the right ICA are  consistent with a 1-39% stenosis. Left Carotid: There is no evidence of stenosis in the left ICA. Vertebrals:  Right vertebral artery demonstrates antegrade flow. Left vertebral              artery was not visualized. See technical comments listed above. Subclavians: Left subclavian artery was not visualized. See technical comments              listed above. Normal flow hemodynamics were seen in the right              subclavian artery. *See table(s) above for measurements and observations.     Preliminary      PHYSICAL EXAM  Elderly African-American gentleman who is intubated not in distress. . Afebrile. Head is nontraumatic. Neck is supple without bruit.    Cardiac exam no murmur or gallop. Lungs are clear to auscultation. Distal pulses are well felt. Neurological Exam :  Patient is intubated and unresponsive.  Eyes are closed.  Eyes open partially to sternal rub.  He is not following any commands.  Pupils small irregular sluggish reactive.  No blink to threat on either side.  Left gaze deviation and even with doll's eye movements eyes do not move to the right.  Right lower  facial weakness.  Tongue midline.  Dense right hemiplegia with hypotonia trace withdrawal in the lower extremity to pain but none in the upper extremity.  Spasticity and increased tone in the left upper and lower extremity with semipurposeful movements on the left side to painful stimuli.  Both plantars are upgoing.  ASSESSMENT/PLAN Mr. Johne PoRizwan Kuypera 78 y.o. male with history of prior stroke, Alzheimer's dementia, CHD, DM, Htn and gout presenting with unresponsiveness, plegic on the right and leftward eye deviation.   Suspect probably cardioembolic, given EF 40% in 2019 and ECG abnormalities. tPA Given Friday 5/8//20 @ 13:15.  Stroke: acute left basal ganglia infarct s/p tPA w/ small volume petechial hemorrhage - infarct likely embolic - mult sources:  EF, AF, L ICA stenosis  CT head - No acute left brain insult identified.  Old right MCA territory stroke progressed to encephalomalacia.  MRI head - 4 cm acute left basal ganglia infarct with small volume petechial hemorrhage.  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  Carotid Doppler - B ICA 1-39% stenosis, R VA antegrade. L VA not seen   2D Echo  - EF 20 - 25%. Possible source of emboli.  LDL - 103  HgbA1c - 6.4  UDS - not performed  VTE prophylaxis - SCDs  On aspirin 81 and plavix 75 mg daily PTA. No on no antithrombotics.  Add low dose aspirin via tube.   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 Family wants full aggressive care in this pt with limited ability for recovery. Pt of Dr. Marlis Edelson in the past. He will speak with family.   Palliative Care following - family want to see how he does over the next few days  Acute Respiratory Failure  Secondary to stroke  Intubated  Will need trach for ongoing aggressive care  PCCM on board  Atrial Fibrillation  Home anticoagulation:  none   Not an AC candidate given petechial hemorrhage  On aspirin 81   Home meds: metoprolol 12.5, resumed  Hypertension  Stable on the low side . Home meds: metoprolol 12.5, resumed . Long-term BP goal normotensive  Hyperlipidemia  Lipid lowering medication PTA:  Lipitor 40 mg daily  LDL 103, goal < 70  Current lipid lowering medication: Lipitor 80 mg daily  Continue statin at discharge  Diabetes type II, controlled  HgbA1c 6.4, goal < 7.0  Chronic Dysphagia  TF via PEG  NPO  Other Stroke Risk Factors  Advanced age  ETOH use, advised to drink no more than 1 alcoholic beverage per day.  Overweight, Body mass index is 28.84 kg/m., recommend weight loss, diet and exercise as appropriate   Hx stroke/TIA  07/2017 - Right inferior MCA distribution late acute to subacute infarction  involving temporal lobe, insula, and basal ganglia, with petechial hemorrhage. To CIR. Home w/ PEG.  Systolic CHF  Cardiomyopathy - EF 20 - 25%  Other Active Problems  Alzheimer's dementia on namenda  CKD - stage III - creatinine 1.32  UTI with prior E coli  Mild anemia - Hb - 9.7  Mg - 1.6 -> repeat 1.9  Hospital day # 3  I have personally obtained history,examined this patient, reviewed notes, independently viewed imaging studies, participated in medical decision making and plan of care.ROS completed by me personally and pertinent positives fully documented  I have  made any additions or clarifications directly to the above note.  He has prior baseline history of Alzheimer's dementia and spastic left hemiplegia and presented with aphasia and right hemiplegia and was treated with IV TPA but has not shown any significant improvement.  His neurological prognosis is poor given his poor neurological baseline but family wants to pursue aggressive care at the present time.  Continue strict blood pressure control as per post TPA protocol.  Continue to maintain respiratory support for now but will meet with family and discuss treatment options of continuing ongoing care which may need tracheostomy PEG tube and nursing home placement versus discussion about palliative care and comfort care if family is willing.  Discussed with Dr. Craige Cotta from critical care medicine. This patient is critically ill and at significant risk of neurological worsening, death and care requires constant monitoring of vital signs, hemodynamics,respiratory and cardiac monitoring, extensive review of multiple databases, frequent neurological assessment, discussion with family, other specialists and medical decision making of high complexity.I have made any additions or clarifications directly to the above note.This critical care time does not reflect procedure time, or teaching time or supervisory time of PA/NP/Med Resident etc but  could involve care discussion time.  I spent 35 minutes of neurocritical care time  in the care of  this patient.      Delia Heady, MD Medical Director [facility] Pager: [phone] 10/11/2018 2:42 PM   To contact Stroke Continuity provider, please refer to [email or web address]. After hours, contact General Neurology

## 2018-10-12 DIAGNOSIS — R4182 Altered mental status, unspecified: Secondary | ICD-10-CM

## 2018-10-12 LAB — GLUCOSE, CAPILLARY
Glucose-Capillary: 101 mg/dL — ABNORMAL HIGH (ref 70–99)
Glucose-Capillary: 112 mg/dL — ABNORMAL HIGH (ref 70–99)
Glucose-Capillary: 74 mg/dL (ref 70–99)
Glucose-Capillary: 96 mg/dL (ref 70–99)
Glucose-Capillary: 98 mg/dL (ref 70–99)
Glucose-Capillary: 98 mg/dL (ref 70–99)

## 2018-10-12 LAB — BASIC METABOLIC PANEL
Anion gap: 9 (ref 5–15)
BUN: 35 mg/dL — ABNORMAL HIGH (ref 8–23)
CO2: 21 mmol/L — ABNORMAL LOW (ref 22–32)
Calcium: 8.7 mg/dL — ABNORMAL LOW (ref 8.9–10.3)
Chloride: 116 mmol/L — ABNORMAL HIGH (ref 98–111)
Creatinine, Ser: 1.07 mg/dL (ref 0.61–1.24)
GFR calc Af Amer: >60 mL/min (ref >60)
GFR calc non Af Amer: >60 mL/min (ref >60)
Glucose, Bld: 118 mg/dL — ABNORMAL HIGH (ref 70–99)
Potassium: 3.7 mmol/L (ref 3.5–5.1)
Sodium: 146 mmol/L — ABNORMAL HIGH (ref 135–145)

## 2018-10-12 LAB — CBC
HCT: 27.2 % — ABNORMAL LOW (ref 39.0–52.0)
Hemoglobin: 8.7 g/dL — ABNORMAL LOW (ref 13.0–17.0)
MCH: 26.9 pg (ref 26.0–34.0)
MCHC: 32 g/dL (ref 30.0–36.0)
MCV: 84.2 fL (ref 80.0–100.0)
Platelets: 210 10*3/uL (ref 150–400)
RBC: 3.23 MIL/uL — ABNORMAL LOW (ref 4.22–5.81)
RDW: 16.8 % — ABNORMAL HIGH (ref 11.5–15.5)
WBC: 9.4 10*3/uL (ref 4.0–10.5)
nRBC: 0 % (ref 0.0–0.2)

## 2018-10-12 NOTE — Progress Notes (Signed)
NAME:  Noah Damp., MRN:  [id number], DOB:  04-Dec-1940, LOS: 4 ADMISSION DATE:  Oct 26, 2018, CONSULTATION DATE:  2018/10/26 REFERRING MD:  Dr. Otelia Limes, CHIEF COMPLAINT:  AMS   Brief History   78 y/o M who presented to [facility] ER with right sided weakness, LSN 1105.  CT head without acute hemorrhage or left sided hypodensity.  tPA administered. Intubated for AMS/poor airway protection.  Was seen by palliative care as outpt prior to admission and was DNR.  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  On plavix / ASA at baseline.  Alzheimer's Dementia  CKD  DM  HTN  UTI with prior E-Coli ACD  Significant Hospital Events   5/8 admit, tPA 5/11 DNR, no escalation of care  Consults:  Palliative care  Procedures:  5/8 ETT >>   Significant Diagnostic Tests:  CT Head 5/8 >> no acute left brain abnormality, old right MCA territory CVA that progressed to encephalomalacia MRI/MRA brain 5/9 >> acute Lt BG infarct, chronic Rt MCA infarct, chronic Lt cerebellar infarct, advanced atherosclerotic changes with severe left cavernous ICA and moderate to severe Lt M1 stenoses. TTE 5/9 >> EF 25-30%; diastolic dysfunction; neg bubble/septal defect.  Micro Data:  Covid 5/08 >> negative  Antimicrobials:  n/a   Interim history/subjective:  Remains on vent.  Objective   Blood pressure 119/71, pulse 69, temperature 98.6 F (37 C), temperature source Axillary, resp. rate 19, height 5\' 11"  (1.803 m), weight 93.8 kg, SpO2 100 %.    Vent Mode: PRVC FiO2 (%):  [40 %] 40 % Set Rate:  [16 bmp] 16 bmp Vt Set:  [60 mL-600 mL] 600 mL PEEP:  [5 cmH20] 5 cmH20 Pressure Support:  [5 cmH20] 5 cmH20 Plateau Pressure:  [10 cmH20-19 cmH20] 12 cmH20   Intake/Output Summary (Last 24 hours) at 10/12/2018 0857 Last data filed at 10/12/2018 0700 Gross per 24 hour  Intake 1509.61 ml  Output 1350 ml  Net 159.61 ml   Filed  Weights   10-26-18 1252 10/10/18 0500 10/11/18 0500  Weight: 88.3 kg 92.4 kg 93.8 kg    Examination:  General - unresponsive Eyes - pupils reactive ENT - ETT in place Cardiac - irregular Chest - equal breath sounds b/l, no wheezing or rales Abdomen - soft, non tender, + bowel sounds Extremities - 1+ edema Skin - no rashes Neuro - not following commands  Resolved Hospital Problem list     Assessment & Plan:   Acute hypoxic respiratory failure with compromised airway in setting of CVA. Plan - family has opted against trach  Acute Lt BG infarct s/p tPA. Hx of Rt MCA and Lt cerebellar infarcts. Alzheimer's dementia. Plan - per neurology  A fib with slow heart rate >> undetermined if new or old. Systolic CHF >> undetermined if new or old. Hx of HTN, HLD. Plan - monitor hemodynamics  DM type II. Plan - SSI  Chronic dysphagia. Plan - tube feeds   Goals of care. Plan - DNR, no escalation of care - family planning for one way extubation later this week   Best practice:  Diet: tube feeds DVT prophylaxis: SCDs GI prophylaxis: protonix Mobility: bed rest Code Status: DNR Disposition: ICU  Labs    CMP Latest Ref Rng & Units 10/12/2018 10/11/2018 10/10/2018  Glucose 70 - 99 mg/dL 884(Z) 660(Y) 301(S)  BUN 8 - 23 mg/dL 01(U) 93(A) 35(T)  Creatinine 0.61 - 1.24 mg/dL  1.07 1.11 1.32(H)  Sodium 135 - 145 mmol/L 146(H) 145 143  Potassium 3.5 - 5.1 mmol/L 3.7 4.7 4.3  Chloride 98 - 111 mmol/L 116(H) 116(H) 115(H)  CO2 22 - 32 mmol/L 21(L) 18(L) 19(L)  Calcium 8.9 - 10.3 mg/dL 8.6(V8.7(L) 7.8(I8.7(L) 6.9(G8.8(L)  Total Protein 6.5 - 8.1 g/dL - - 5.5(L)  Total Bilirubin 0.3 - 1.2 mg/dL - - 0.6  Alkaline Phos 38 - 126 U/L - - 45  AST 15 - 41 U/L - - 16  ALT 0 - 44 U/L - - 10   CBC Latest Ref Rng & Units 10/12/2018 10/11/2018 10/10/2018  WBC 4.0 - 10.5 K/uL 9.4 9.6 9.4  Hemoglobin 13.0 - 17.0 g/dL 2.9(B8.7(L) 2.8(U9.7(L) 1.3(K9.9(L)  Hematocrit 39.0 - 52.0 % 27.2(L) 29.8(L) 30.1(L)  Platelets  150 - 400 K/uL 210 197 187   ABG    Component Value Date/Time   PHART 7.404 10/09/2018 1458   PCO2ART 39.2 10/12/2018 1458   PO2ART 477 (H) 10/18/2018 1458   HCO3 24.0 10/16/2018 1458   TCO2 20 (L) 07/23/2017 1334   ACIDBASEDEF 0.1 10/11/2018 1458   O2SAT 99.4 10/01/2018 1458   CBG (last 3)  Recent Labs    10/11/18 2326 10/12/18 0329 10/12/18 0752  GLUCAP 86 96 98    Coralyn HellingVineet Kamara Allan, MD [place] [facility] 10/12/2018, 8:57 AM

## 2018-10-12 NOTE — Progress Notes (Signed)
Daily Progress Note   Patient Name: Noah Bush.       Date: 10/12/2018 DOB: 02/16/1941  Age: 78 y.o. MRN#: [id number] Attending Physician: Garvin Fila, MD Primary Care Physician: Novella Rob, FNP Admit Date: 10/01/2018  Reason for Consultation/Follow-up: Establishing goals of care  Subjective:  Patient is intubated, mechanically ventilated. He has eyes half open, blinks. He doesn't follow commands, he withdraws to painful stimuli.   Wife, son and daughter arrived at the bedside and a family meeting occurred, see below.   Length of Stay: 4  Current Medications: Scheduled Meds:  . aspirin  81 mg Per Tube Daily  . atorvastatin  80 mg Per Tube q1800  . chlorhexidine gluconate (MEDLINE KIT)  15 mL Mouth Rinse BID  . feeding supplement (PRO-STAT SUGAR FREE 64)  30 mL Per Tube BID  . insulin aspart  0-15 Units Subcutaneous Q4H  . latanoprost  1 drop Left Eye QHS  . mouth rinse  15 mL Mouth Rinse 10 times per day  . pantoprazole sodium  40 mg Per Tube QHS    Continuous Infusions: . sodium chloride 10 mL/hr at 10/12/18 1000  . feeding supplement (VITAL AF 1.2 CAL) 50 mL/hr at 10/12/18 1000    PRN Meds: acetaminophen **OR** acetaminophen (TYLENOL) oral liquid 160 mg/5 mL **OR** acetaminophen, fentaNYL (SUBLIMAZE) injection, senna-docusate  Physical Exam         Eyes open, blinks Has ETT Irregular Equal breath sounds on vent Has edema Is able to move his LUE, attempts to reach for ETT with LUE Edema and not able to move RUE Has PEG tube  Vital Signs: BP (!) 103/58   Pulse (!) 34   Temp 98.6 F (37 C) (Axillary)   Resp 16   Ht 5' 11"  (1.803 m)   Wt 93.8 kg   SpO2 100%   BMI 28.84 kg/m  SpO2: SpO2: 100 % O2 Device: O2 Device: Ventilator O2 Flow Rate:     Intake/output summary:   Intake/Output Summary (Last 24 hours) at 10/12/2018 1448 Last data filed at 10/12/2018 1000 Gross per 24 hour  Intake 1248.26 ml  Output 1750 ml  Net -501.74 ml   LBM:   Baseline Weight: Weight: 88.3 kg Most recent weight: Weight: 93.8 kg       Palliative Assessment/Data:  Flowsheet Rows     Most Recent Value  Intake Tab  Referral Department  Critical care  Unit at Time of Referral  ICU  Palliative Care Primary Diagnosis  Neurology  Date Notified  10/09/18  Palliative Care Type  Return patient Palliative Care  Reason for referral  Clarify Goals of Care  Date of Admission  10/31/2018  Date first seen by Palliative Care  10/10/18  # of days Palliative referral response time  1 Day(s)  # of days IP prior to Palliative referral  1  Clinical Assessment  Palliative Performance Scale Score  10%  Pain Max last 24 hours  Not able to report  Pain Min Last 24 hours  Not able to report  Dyspnea Max Last 24 Hours  Not able to report  Dyspnea Min Last 24 hours  Not able to report  Psychosocial & Spiritual Assessment  Palliative Care Outcomes  Patient/Family meeting held?  Yes  Who was at the meeting?  son via phone      Patient Active Problem List   Diagnosis Date Noted  . Stroke (cerebrum) ([place]) 10/26/2018  . Debility 09/21/2018  . Anorexia 09/21/2018  . Decreased appetite 08/20/2018  . General body deterioration 08/20/2018  . Palliative care encounter 08/20/2018  . Advanced care planning/counseling discussion   . Goals of care, counseling/discussion   . Severe sepsis ([place]) 12/23/2017  . AKI (acute kidney injury) ([place]) 12/23/2017  . UTI (urinary tract infection) 12/23/2017  . Acute cholecystitis 12/23/2017  . Cough   . Hypotension due to drugs   . Finger pain, right   . Poor nutrition   . Acute blood loss anemia   . Mixed Alzheimer's and vascular dementia ([place]) 07/28/2017  . Acute ischemic right MCA stroke ([place]) 07/28/2017  . Diabetes  mellitus type 2 in obese ([place])   . Stage 3 chronic kidney disease ([place])   . Morbid obesity ([place])   . Cerebral infarction ([place]) 07/27/2017  . Hyperlipidemia   . Essential hypertension   . 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 Asymmetry of cerebral ventricles, Acute, Acquired  07/23/2017  . 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   Recommendations/Plan:   Family meeting: Patient's wife, son Darrius and daughter arrived at the bedside. As soon as they came in, they tried to wake up/interact with the patient. They were seen imploring him to "wake up", to "shake my hand". Family seen asking patient to wake up and sit by the edge of the bed and that they were here to take him home.  I introduced myself and palliative care as follows: Palliative medicine is specialized medical care for people living with serious illness. It focuses on providing relief from the symptoms and stress of a serious illness. The goal is to improve quality of life for both the patient and the family.  Introduced to the family about goals of care as follows: Goals of care: Broad aims of medical therapy in relation to the patient's values and preferences. Our aim is to provide medical care aimed at enabling patients to achieve the goals that matter most to them, given the circumstances of their particular medical situation and their constraints.   Asked the family about what  they understood of the patient's current condition. Patient's wife was able to tell us that the patient has had a stroke and that he has been placed on a ventilator.   We discussed about one way extubation and full scope of comfort measures. The patient's son and daughter had several  questions for [place]. They asked is the patient was 'brain dead", they asked for the patient to undergo surgery to remove bleeding on his brain, they asked for the patient to have stents placed in his heart to make his heart stronger.   All of the family's above questions addressed to the best of my ability. Family asking about removing ETT and letting the patient speak. Both RN and myself discussed frankly but compassionately with the patient's family that he might not be able to verbalize, given the extensive nature of his brain damage. Weaning process also explained.   Discussed with family about aggressive measures: trach, continuing PEG tube feeds, possibly LTACH level of care, versus one way extubation and comfort measures if the patient had symptoms after extubation. Explained, to the best of my ability, about the patient's current brain imaging.   The patient's family is not able to make this decision today. They stated that it appears that the patient is more "responsive" today, his eyes are more open and that he was attempting to follow their commands.   Hospice services visited with the patient 3 weeks ago, for the first time. At that time, patient elected for no CPR. Family states that he did not set any other limits on his care, other than no CPR.   PMT to continue to follow. Family to continue discussions amongst themselves, patient has 4 children, 2 were at the bedside today, along with the patient's wife.    Code Status:    Code Status Orders  (From admission, onward)         Start     Ordered   10/11/18 1605  Do not attempt resuscitation (DNR)  Continuous    Question Answer Comment  In the event of cardiac or respiratory ARREST Do not call a "code blue"   In the event of cardiac or respiratory ARREST Do not perform Intubation, CPR, defibrillation or ACLS   In the event of cardiac or respiratory ARREST Use medication by any route, position, wound care, and other measures to  relive pain and suffering. May use oxygen, suction and manual treatment of airway obstruction as needed for comfort.      10/11/18 1604        Code Status History    Date Active Date Inactive Code Status Order ID Comments User Context   10/09/2018 1344 10/11/2018 1604 Full Code [id number]  Kerney Elbe, MD ED   12/23/2017 2257 01/03/2018 1854 Full Code [id number]  Lance Coon, MD Inpatient   08/13/2017 1711 08/25/2017 1443 Full Code [id number]  Corrie Mckusick, DO Inpatient   07/28/2017 1832 08/13/2017 1711 Full Code [id number]  Bary Leriche, PA-C Inpatient   07/28/2017 1832 07/28/2017 1832 Full Code [id number]  Flora Lipps Inpatient   07/23/2017 1623 07/28/2017 1829 Full Code [id number]  Nuala Alpha, DO ED       Prognosis:   guarded   Discharge Planning:  To Be Determined  Care plan was discussed with  Patient's wife, son and daughter. RN was also present for the family meeting.   Thank you for allowing the Palliative Medicine Team to assist in the care of this patient.   Time  In: 1300 Time Out: 1430 Total Time 90 min  Prolonged Time Billed  yes       Greater than 50%  of this time was spent counseling and coordinating care related to the above assessment and plan.  Loistine Chance, MD [phone] Please contact Palliative Medicine Team phone at [phone] for questions and concerns.

## 2018-10-12 NOTE — Progress Notes (Signed)
STROKE TEAM PROGRESS NOTE   SUBJECTIVE (INTERVAL HISTORY) Remains neurologically unchanged though he can be briefly arousable to sternal rub but is not interactive.  He remains critically ill on ventilatory support.  Palliative care team have discussed goals of care and agreed to DNR and 1 way extubation and comfortcare which is planned for later today OBJECTIVE Vitals:   10/12/18 0500 10/12/18 0600 10/12/18 0700 10/12/18 0758  BP: (!) 105/56 104/62 119/71   Pulse: 70 (!) 56 (!) 48 69  Resp: 16 16 16 19   Temp:    98.6 F (37 C)  TempSrc:    Axillary  SpO2: 100% 100% 100% 100%  Weight:      Height:        CBC:  Recent Labs  Lab 10/26/2018 1242  10/11/18 0514 10/12/18 0148  WBC 7.9   < > 9.6 9.4  NEUTROABS 3.4  --   --   --   HGB 11.8*   < > 9.7* 8.7*  HCT 35.9*   < > 29.8* 27.2*  MCV 83.3   < > 83.5 84.2  PLT 219   < > 197 210   < > = values in this interval not displayed.    Basic Metabolic Panel:  Recent Labs  Lab 10/09/18 0436 10/09/18 1615 10/10/18 0258 10/11/18 1842 10/12/18 0148  NA 142  --  143 145 146*  K 4.3  --  4.3 4.7 3.7  CL 109  --  115* 116* 116*  CO2 21*  --  19* 18* 21*  GLUCOSE 124*  --  127* 102* 118*  BUN 31*  --  37* 35* 35*  CREATININE 1.26*  --  1.32* 1.11 1.07  CALCIUM 9.1  --  8.8* 8.7* 8.7*  MG 1.6* 1.9 1.9 1.8  --   PHOS 3.1 2.8  --   --   --     IMAGING past 24h No results found.   PHYSICAL EXAM   Elderly African-American gentleman who is intubated not in distress. . Afebrile. Head is nontraumatic. Neck is supple without bruit.    Cardiac exam no murmur or gallop. Lungs are clear to auscultation. Distal pulses are well felt. Neurological Exam :  Patient is intubated and unresponsive.  .  Eyes open partially to sternal rub. Left gaze preference. he is not following any commands.  Pupils small irregular sluggish reactive.  No blink to threat on either side.  Left gaze deviation and even with doll's eye movements eyes do not move to  the right.  Right lower facial weakness.  Tongue midline.  Dense right hemiplegia with hypotonia trace withdrawal in the lower extremity to pain but none in the upper extremity.  Spasticity and increased tone in the left upper and lower extremity with semipurposeful movements on the left side to painful stimuli.  Both plantars are upgoing.   ASSESSMENT/PLAN Mr. Bobby RumpfJames Corallo Jr. is a 78 y.o. male with history of prior stroke, Alzheimer's dementia, CHD, DM, Htn and gout presenting with unresponsiveness, plegic on the right and leftward eye deviation.   Suspect probably cardioembolic, given EF 40% in 2019 and ECG abnormalities. tPA Given Friday 5/8//20 @ 13:15.  Stroke: acute left basal ganglia infarct s/p tPA w/ small volume petechial hemorrhage - infarct likely embolic - mult sources:  EF, AF, L ICA stenosis  CT head - No acute left brain insult identified. Old right MCA territory stroke progressed to encephalomalacia.  MRI head - 4 cm acute left basal ganglia infarct  with small volume petechial hemorrhage.  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  Carotid Doppler - B ICA 1-39% stenosis, R VA antegrade. L VA not seen   2D Echo  - EF 20 - 25%. Possible source of emboli.  LDL - 103  HgbA1c - 6.4  UDS - not performed  VTE prophylaxis - SCDs  On aspirin 81 and plavix 75 mg daily PTA. No on no antithrombotics.  Add low dose aspirin via tube.   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 Family wants full aggressive care in this pt with limited ability for recovery. Pt of Dr. Marlis Edelson in the past.   Palliative Care following - wife agreeable to DNR and  for one-way extubation this am. Do not expect him to survive off the vent.  Acute.  Secondary to stroke  Intubated  Will need trach for ongoing aggressive care  PCCM on  board  Atrial Fibrillation  Home anticoagulation:  none   Not an AC candidate given petechial hemorrhage  On aspirin 81   Home meds: metoprolol 12.5, resumed  Hypertension  Stable on the low side . Home meds: metoprolol 12.5, resumed . Long-term BP goal normotensive  Hyperlipidemia  Lipid lowering medication PTA:  Lipitor 40 mg daily  LDL 103, goal < 70  Current lipid lowering medication: Lipitor 80 mg daily  Continue statin at discharge  Diabetes type II, controlled  HgbA1c 6.4, goal < 7.0  Chronic Dysphagia  TF via PEG  NPO  Other Stroke Risk Factors  Advanced age  ETOH use, advised to drink no more than 1 alcoholic beverage per day.  Overweight, Body mass index is 28.84 kg/m., recommend weight loss, diet and exercise as appropriate   Hx stroke/TIA  07/2017 - Right inferior MCA distribution late acute to subacute infarction involving temporal lobe, insula, and basal ganglia, with petechial hemorrhage. To CIR. Home w/ PEG.  Systolic CHF  Cardiomyopathy - EF 20 - 25%  Other Active Problems  Alzheimer's dementia on namenda  CKD - stage III - creatinine 1.32  UTI with prior E coli  Mild anemia - Hb - 9.7->8.7  Mg - 1.6 -> repeat 1.9  Rhythm change - EKG showed R axis deviation, LBBB  Hospital day # 4 .  Has disabling stroke and poor prognosis and is critically ill with respiratory failure.  Palliative care team has discussed goals of care wife agreed to DNR and 1 way extubation.  Discussed with Dr. Craige Cotta critocal care medicine and palliative care team. This patient is critically ill and at significant risk of neurological worsening, death and care requires constant monitoring of vital signs, hemodynamics,respiratory and cardiac monitoring, extensive review of multiple databases, frequent neurological assessment, discussion with family, other specialists and medical decision making of high complexity.I have made any additions or clarifications  directly to the above note.This critical care time does not reflect procedure time, or teaching time or supervisory time of PA/NP/Med Resident etc but could involve care discussion time.  I spent 30 minutes of neurocritical care time  in the care of  this patient.      Delia Heady, MD Medical Director [facility] Pager: [phone] 10/12/2018 8:48 AM   To contact Stroke Continuity provider, please refer to [email or web address]. After hours, contact General Neurology

## 2018-10-12 NOTE — Progress Notes (Signed)
SLP Cancellation Note  Patient Details Name: Noah Bush. MRN: [id number] DOB: [birth year]   Cancelled treatment:       Reason Eval/Treat Not Completed: Medical issues which prohibited therapy;Patient not medically ready(Pt remains intubated. SLP will follow up. )  Anaysia Germer I. Vear Clock, MS, CCC-SLP Acute Rehabilitation Services Office number [phone] Pager [phone]  Scheryl Marten 10/12/2018, 11:07 AM

## 2018-10-13 DIAGNOSIS — I63312 Cerebral infarction due to thrombosis of left middle cerebral artery: Secondary | ICD-10-CM

## 2018-10-13 LAB — GLUCOSE, CAPILLARY
Glucose-Capillary: 114 mg/dL — ABNORMAL HIGH (ref 70–99)
Glucose-Capillary: 95 mg/dL (ref 70–99)
Glucose-Capillary: 120 mg/dL — ABNORMAL HIGH (ref 70–99)
Glucose-Capillary: 110 mg/dL — ABNORMAL HIGH (ref 70–99)

## 2018-10-13 MED ORDER — MIDAZOLAM HCL 2 MG/2ML IJ SOLN: 1.0000 mg | INTRAMUSCULAR | Status: DC | PRN

## 2018-10-13 MED ORDER — GLYCOPYRROLATE 0.2 MG/ML IJ SOLN
0.2000 mg | INTRAMUSCULAR | Status: DC | PRN
  Administered 2018-10-15 (×3): 0.2 mg via INTRAVENOUS
  Filled 2018-10-13 (×4): qty 1

## 2018-10-13 MED ORDER — ACETAMINOPHEN 650 MG RE SUPP: 650.0000 mg | Freq: Four times a day (QID) | RECTAL | Status: DC | PRN

## 2018-10-13 MED ORDER — GLYCOPYRROLATE 0.2 MG/ML IJ SOLN: 0.2000 mg | INTRAMUSCULAR | Status: DC | PRN

## 2018-10-13 MED ORDER — ACETAMINOPHEN 325 MG PO TABS: 650.0000 mg | ORAL_TABLET | Freq: Four times a day (QID) | ORAL | Status: DC | PRN

## 2018-10-13 MED ORDER — MORPHINE SULFATE (PF) 2 MG/ML IV SOLN: 2.0000 mg | INTRAVENOUS | Status: DC | PRN

## 2018-10-13 MED ORDER — DIPHENHYDRAMINE HCL 50 MG/ML IJ SOLN: 25.0000 mg | INTRAMUSCULAR | Status: DC | PRN

## 2018-10-13 MED ORDER — POLYVINYL ALCOHOL 1.4 % OP SOLN
1.0000 [drp] | Freq: Four times a day (QID) | OPHTHALMIC | Status: DC | PRN
  Filled 2018-10-13: qty 15

## 2018-10-13 MED ORDER — MORPHINE 100MG IN NS 100ML (1MG/ML) PREMIX INFUSION
0.0000 mg/h | INTRAVENOUS | Status: DC
  Administered 2018-10-14: 2 mg/h via INTRAVENOUS
  Administered 2018-10-15: 4 mg/h via INTRAVENOUS
  Filled 2018-10-13 (×2): qty 100

## 2018-10-13 MED ORDER — MIDAZOLAM BOLUS VIA INFUSION (WITHDRAWAL LIFE SUSTAINING TX)
2.0000 mg | INTRAVENOUS | Status: DC | PRN
  Administered 2018-10-14: 2 mg via INTRAVENOUS
  Filled 2018-10-13: qty 2

## 2018-10-13 MED ORDER — CHLORHEXIDINE GLUCONATE CLOTH 2 % EX PADS
6.0000 | MEDICATED_PAD | Freq: Every day | CUTANEOUS | Status: DC
  Administered 2018-10-12 – 2018-10-15 (×2): 6 via TOPICAL

## 2018-10-13 MED ORDER — DEXTROSE 5 % IV SOLN: INTRAVENOUS | Status: DC

## 2018-10-13 MED ORDER — MORPHINE BOLUS VIA INFUSION
5.0000 mg | INTRAVENOUS | Status: DC | PRN
  Administered 2018-10-14 – 2018-10-15 (×6): 5 mg via INTRAVENOUS
  Filled 2018-10-13: qty 5

## 2018-10-13 MED ORDER — GLYCOPYRROLATE 1 MG PO TABS
1.0000 mg | ORAL_TABLET | ORAL | Status: DC | PRN
  Filled 2018-10-13: qty 1

## 2018-10-13 MED ORDER — MIDAZOLAM 50MG/50ML (1MG/ML) PREMIX INFUSION
0.0000 mg/h | INTRAVENOUS | Status: DC
  Administered 2018-10-14 – 2018-10-15 (×2): 1 mg/h via INTRAVENOUS
  Filled 2018-10-13 (×2): qty 50

## 2018-10-13 NOTE — Procedures (Signed)
Extubation Procedure Note  Patient Details:   Name: Noah Bush. DOB: [birth year] MRN: [id number]   Airway Documentation:    Vent end date: 10/13/18 Vent end time: 1744   Evaluation  O2 sats: transiently fell during during procedure Complications: No apparent complications Patient did tolerate procedure well. Bilateral Breath Sounds: Rhonchi   One way extubation performed based on family's request & MD order  Jacqulynn Cadet 10/13/2018, 5:46 PM

## 2018-10-13 NOTE — Progress Notes (Signed)
PMT progress note  Patient was seen earlier today, eyes somewhat open, in no distress, on the vent, unable to follow commands.  Discussed with RN  BP (!) 93/53 (BP Location: Left Arm)   Pulse 65   Temp 99.3 F (37.4 C) (Axillary)   Resp 14   Ht 5\' 11"  (1.803 m)   Wt 96.1 kg   SpO2 100%   BMI 29.55 kg/m  Labs and imaging reviewed.   Chart reviewed. PMT RN discussed with PCCM NP earlier today.   Note that the family has elected to proceed with one way extubation, with plans for comfort measures if the patient has resp distress or other symptoms. PMT is in full agreement with this plan.   PMT to continue to follow, monitor hospital course post extubation and to assist with hospice needs if deemed appropriate in the near future.   15 minutes spent. Rosalin Hawking MD [place] palliative medicine team [phone]

## 2018-10-13 NOTE — Progress Notes (Signed)
STROKE TEAM PROGRESS NOTE   SUBJECTIVE (INTERVAL HISTORY) Patient's neurological condition remains unchanged.  Patient's wife and son spoke to palliative care yesterday and have stated patient would not want tracheostomy or prolonged ventilatory support but they did not agree to one-way extubation yet.  Critical care team also spoke to the patient's wife today and we are awaiting their decision on one-way extubation  OBJECTIVE Vitals:   10/13/18 0700 10/13/18 0750 10/13/18 0800 10/13/18 0900  BP: 116/69  (!) 108/54 (!) 111/55  Pulse: (!) 31  73 60  Resp: _0 Temp:   98.8 F (37.1 C)   TempSrc:   Axillary   SpO2: 100% 94% 98% 98%  Weight:      Height:        CBC:  Recent Labs  Lab 10/20/2018 1242  10/11/18 0514 10/12/18 0148  WBC 7.9   < > 9.6 9.4  NEUTROABS 3.4  --   --   --   HGB 11.8*   < > 9.7* 8.7*  HCT 35.9*   < > 29.8* 27.2*  MCV 83.3   < > 83.5 84.2  PLT 219   < > 197 210   < > = values in this interval not displayed.    Basic Metabolic Panel:  Recent Labs  Lab 10/09/18 0436 10/09/18 1615 10/10/18 0258 10/11/18 1842 10/12/18 0148  NA 142  --  143 145 146*  K 4.3  --  4.3 4.7 3.7  CL 109  --  115* 116* 116*  CO2 21*  --  19* 18* 21*  GLUCOSE 124*  --  127* 102* 118*  BUN 31*  --  37* 35* 35*  CREATININE 1.26*  --  1.32* 1.11 1.07  CALCIUM 9.1  --  8.8* 8.7* 8.7*  MG 1.6* 1.9 1.9 1.8  --   PHOS 3.1 2.8  --   --   --     IMAGING past 24h No results found.   PHYSICAL EXAM   Elderly African-American gentleman who is intubated not in distress. . Afebrile. Head is nontraumatic. Neck is supple without bruit.    Cardiac exam no murmur or gallop. Lungs are clear to auscultation. Distal pulses are well felt. Neurological Exam :  Patient is intubated and unresponsive.  .  Eyes open partially to sternal rub.  But he is globally aphasic and not following any commands.  Left gaze preference.  But does have some spontaneous side-to-side eye movements..  Pupils  small irregular sluggish reactive.  No blink to threat on either side.  Left gaze deviation and even with doll's eye movements eyes do not move to the right.  Right lower facial weakness.  Tongue midline.  Dense right hemiplegia with hypotonia trace withdrawal in the lower extremity to pain but none in the upper extremity.  Spasticity and increased tone in the left upper and lower extremity with semipurposeful movements on the left side to painful stimuli.  Both plantars are upgoing.   ASSESSMENT/PLAN Mr. Ronith Berti. is a 78 y.o. male with history of prior stroke, Alzheimer's dementia, CHD, DM, Htn and gout presenting with unresponsiveness, plegic on the right and leftward eye deviation.   Suspect probably cardioembolic, given EF 32% in 2019 and ECG abnormalities. tPA Given Friday 5/8//20 @ 13:15.  Stroke: acute left basal ganglia infarct s/p tPA w/ small volume petechial hemorrhage - infarct likely embolic - mult sources:  EF, AF, L ICA stenosis  CT head - No acute  left brain insult identified. Old right MCA territory stroke progressed to encephalomalacia.  MRI head - 4 cm acute left basal ganglia infarct with small volume petechial hemorrhage.  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  Carotid Doppler - B ICA 1-39% stenosis, R VA antegrade. L VA not seen   2D Echo  - EF 20 - 25%. Possible source of emboli.  LDL - 103  HgbA1c - 6.4  UDS - not performed  VTE prophylaxis - SCDs  On aspirin 81 and plavix 75 mg daily PTA. No on low dose aspirin.   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 Family wants full aggressive care in this pt with limited ability for recovery. Pt of Dr. Clydene Fake in the past. Was seen by Hospice as an OP prior to current stroke and he elected to be a DNR  Palliative Care met with family (wife and 2 of the  4 kids) yesterday. Family not in agreement for for one-way extubation at this time.   Dr. Leonie Man discussed with CCM. They will both attempt to speak pt wife and family r/t goals of care.   Acute.  Secondary to stroke  Intubated  Will need trach for ongoing aggressive care  PCCM on board  Atrial Fibrillation  Home anticoagulation:  none   Not an AC candidate given petechial hemorrhage  On aspirin 81   Home meds: metoprolol 12.5, resumed  Hypertension  Stable on the low side . Home meds: metoprolol 12.5, resumed . Long-term BP goal normotensive  Hyperlipidemia  Lipid lowering medication PTA:  Lipitor 40 mg daily  LDL 103, goal < 70  Current lipid lowering medication: Lipitor 80 mg daily  Continue statin at discharge  Diabetes type II, controlled  HgbA1c 6.4, goal < 7.0  Chronic Dysphagia  TF via PEG  NPO  Other Stroke Risk Factors  Advanced age  ETOH use, advised to drink no more than 1 alcoholic beverage per day.  Overweight, Body mass index is 29.55 kg/m., recommend weight loss, diet and exercise as appropriate   Hx stroke/TIA  07/2017 - Right inferior MCA distribution late acute to subacute infarction involving temporal lobe, insula, and basal ganglia, with petechial hemorrhage. To CIR. Home w/ PEG.  Systolic CHF  Cardiomyopathy - EF 20 - 25%  Other Active Problems  Alzheimer's dementia on namenda  CKD - stage III - creatinine 1.32  UTI with prior E coli  Mild anemia - Hb - 9.7->8.7  Mg - 1.6 -> repeat 1.9  Rhythm change - EKG showed R axis deviation, LBBB  Hospital day # 5 Patient presented with a new left brain infarct with aphasia and right hemiplegia and has a previous right brain infarct with residual spastic left hemiplegia as well as dementia at baseline.  His prognosis for recovery and making meaningful improvement is negligible.  Family understands poor prognosis and does not want tracheostomy and prolonged ventilatory support  but struggling with decision of one-way extubation.  Appreciate help from palliative care and critical care team and discussion this with the family.  Hopefully family make a decision soon in the next day or 2 about extubation and  comfort care measures only.  The patient remains critically ill with large stroke and likely respiratory failure needing ventilatory support. This patient is critically ill and at significant risk of neurological worsening, death  and care requires constant monitoring of vital signs, hemodynamics,respiratory and cardiac monitoring, extensive review of multiple databases, frequent neurological assessment, discussion with family, other specialists and medical decision making of high complexity.I have made any additions or clarifications directly to the above note.This critical care time does not reflect procedure time, or teaching time or supervisory time of PA/NP/Med Resident etc but could involve care discussion time.  I spent 30 minutes of neurocritical care time  in the care of  this patient.     Antony Contras, MD Medical Director [place] Pager: [phone] 10/13/2018 10:47 AM   To contact Stroke Continuity provider, please refer to [email or web address] After hours, contact General Neurology

## 2018-10-13 NOTE — Progress Notes (Signed)
PCCM Brief Progress Note  I spoke again with the patient's wife and son this afternoon.  After discussing the patients overall prognosis and the family's understanding of the patient's wish not to pursue a tracheostomy, the decision was made to pursue a 1-way extubation this afternoon. I discussed with both son and wife that this means the patient will not be re-intubated upon removal of the ETT. The family endorsed understanding, and endorsed understanding that the patient may pass away as a result of extubation. The wife and son stated they do not want him to suffer. We discussed comfort care measures. The family desires a transition to comfort care if, upon 1-way extubation, he is short of breath, hypoxic, exhibiting respiratory failure.   Plan 1-way extubation this afternoon No escalation of care  Transition to comfort care if exhibiting signs of respiratory failure. Please notify family if/when transitioning to comfort care    Additional CCT 30 minutes   Tessie Fass MSN, AGACNP-BC [facility] Pulmonary/Critical Care Medicine [phone] If no answer, [phone] 10/13/2018, 4:08 PM

## 2018-10-13 NOTE — Progress Notes (Addendum)
NAME:  Noah Bush., MRN:  [id number], DOB:  [birth year], LOS: 5 ADMISSION DATE:  Oct 27, 2018, CONSULTATION DATE:  10/27/2018 REFERRING MD:  Dr. Otelia Limes, CHIEF COMPLAINT:  AMS   Brief History   78 y/o M who presented to [facility] ER with right sided weakness, LSN 1105.  CT head without acute hemorrhage or left sided hypodensity.  tPA administered. Intubated for AMS/poor airway protection.  Was seen by palliative care as outpt prior to admission and was DNR.  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  On plavix / ASA at baseline.  Alzheimer's Dementia  CKD  DM  HTN  UTI with prior E-Coli ACD  Significant Hospital Events   5/8 admit, tPA 5/11 DNR, no escalation of care  Consults:  Palliative care  Procedures:  5/8 ETT >>   Significant Diagnostic Tests:  CT Head 5/8 >> no acute left brain abnormality, old right MCA territory CVA that progressed to encephalomalacia MRI/MRA brain 5/9 >> acute Lt BG infarct, chronic Rt MCA infarct, chronic Lt cerebellar infarct, advanced atherosclerotic changes with severe left cavernous ICA and moderate to severe Lt M1 stenoses. TTE 5/9 >> EF 25-30%; diastolic dysfunction; neg bubble/septal defect.  Micro Data:  Covid 5/08 >> negative  Antimicrobials:  n/a   Interim history/subjective:  Remains intubated Low RR when on PSV/CPAP this morning, resumed full support   Objective   Blood pressure 116/69, pulse (!) 31, temperature 98.8 F (37.1 C), temperature source Axillary, resp. rate 16, height 5\' 11"  (1.803 m), weight 96.1 kg, SpO2 94 %.    Vent Mode: PRVC FiO2 (%):  [40 %] 40 % Set Rate:  [16 bmp] 16 bmp Vt Set:  [600 mL] 600 mL PEEP:  [5 cmH20] 5 cmH20 Plateau Pressure:  [16 cmH20-20 cmH20] 19 cmH20   Intake/Output Summary (Last 24 hours) at 10/13/2018 0857 Last data filed at 10/13/2018 0700 Gross per 24 hour  Intake 1199.84 ml  Output 1020 ml   Net 179.84 ml   Filed Weights   10/10/18 0500 10/11/18 0500 10/13/18 0403  Weight: 92.4 kg 93.8 kg 96.1 kg    Examination:  General - Elderly male, intubated, NAD  HEENT: NCAT, anicteric sclera, trachea midline, ETT secure  Cardiac - Irregular, no JVD, 2+ radial pulses no rgm  Chest - CTA bilaterally. No accessory muscle use, symmetrical chest expansion  Abdomen - soft ndnt normoactive  Extremities - 1+ BLE edema and non-pitting BUE edema  Skin - clean, dry, warm, no rash  Neuro - Opens eyes spontaneously. Does not follow commands. Pupils round, reactive   Resolved Hospital Problem list     Assessment & Plan:   Acute hypoxic respiratory failure with compromised airway in setting of CVA. Plan - family has decline trach - Per PCM notes, do not want long-term vent support and there is talk of 1-way extubation this week   Acute Lt BG infarct s/p tPA. Hx of Rt MCA and Lt cerebellar infarcts. Alzheimer's dementia. Plan - per neurology  A fib with slow heart rate >> undetermined if new or old. Systolic CHF >> undetermined if new or old. Hx of HTN, HLD. Plan - continue tele  DM type II. Plan - SSI  Chronic dysphagia with PEG  Plan - tube feeds   Goals of care. Plan - PCM following - DNR - Plan was tentatively 1-way extubation later this week (per [facility] notes, family does not want  patient on long-term vent)    Goals of Care: 5/13: I spoke with the patient's spouse over the phone to provide daily updates -We discussed the patient's GOC as it pertains to the ventilator. The wife confirms that the patient does not want a tracheostomy. The wife states that the son feels he would not want to remain on the "breathing machine" much longer.  -We discussed that if the patient were to remain on a ventilator, we would recommend a trach. Given that the patient would not want a tracheostomy, we discussed the possibility of 1-way extubation.  The wife endorses understanding that a  1-way extubation means the patient would not be reintubated if respiratory failure were to occur. We discussed that when on CPAP/PSV today he did initiate breaths on his own, but this may not be compatible with life when extubated as his RR was low and his respirations were shallow.  She endorsed understanding and asked if she can talk to her son before making any decisions. She again states that he wouldn't want a tracheostomy and feels overwhelmed.  I expressed sympathy and offered emotional support. P -Continue mechanical ventilatory support -Follow up with family RE GOC  -Anticipating upcoming decision for 1-way extubation, spouse feels overwhelmed making this decision and would like to speak with son   Best practice:  Diet: tube feeds DVT prophylaxis: SCDs GI prophylaxis: protonix Mobility: bed rest Code Status: DNR Disposition: ICU  Labs    CMP Latest Ref Rng & Units 10/12/2018 10/11/2018 10/10/2018  Glucose 70 - 99 mg/dL 161(W118(H) 960(A102(H) 540(J127(H)  BUN 8 - 23 mg/dL 81(X35(H) 91(Y35(H) 78(G37(H)  Creatinine 0.61 - 1.24 mg/dL 9.561.07 2.131.11 0.86(V1.32(H)  Sodium 135 - 145 mmol/L 146(H) 145 143  Potassium 3.5 - 5.1 mmol/L 3.7 4.7 4.3  Chloride 98 - 111 mmol/L 116(H) 116(H) 115(H)  CO2 22 - 32 mmol/L 21(L) 18(L) 19(L)  Calcium 8.9 - 10.3 mg/dL 7.8(I8.7(L) 6.9(G8.7(L) 2.9(B8.8(L)  Total Protein 6.5 - 8.1 g/dL - - 5.5(L)  Total Bilirubin 0.3 - 1.2 mg/dL - - 0.6  Alkaline Phos 38 - 126 U/L - - 45  AST 15 - 41 U/L - - 16  ALT 0 - 44 U/L - - 10   CBC Latest Ref Rng & Units 10/12/2018 10/11/2018 10/10/2018  WBC 4.0 - 10.5 K/uL 9.4 9.6 9.4  Hemoglobin 13.0 - 17.0 g/dL 2.8(U8.7(L) 1.3(K9.7(L) 4.4(W9.9(L)  Hematocrit 39.0 - 52.0 % 27.2(L) 29.8(L) 30.1(L)  Platelets 150 - 400 K/uL 210 197 187   ABG    Component Value Date/Time   PHART 7.404 10/27/2018 1458   PCO2ART 39.2 10/26/2018 1458   PO2ART 477 (H) 10/25/2018 1458   HCO3 24.0 10/24/2018 1458   TCO2 20 (L) 07/23/2017 1334   ACIDBASEDEF 0.1 10/27/2018 1458   O2SAT 99.4 10/27/2018 1458    CBG (last 3)  Recent Labs    10/12/18 2337 10/13/18 0339 10/13/18 0820  GLUCAP 112* 114* 95    Tessie FassGrace Kayonna Lawniczak MSN, AGACNP-BC [place] Pulmonary/Critical Care Medicine [phone] If no answer, [phone] 10/13/2018, 8:58 AM

## 2018-10-14 ENCOUNTER — Other Ambulatory Visit: Payer: Self-pay

## 2018-10-14 ENCOUNTER — Telehealth: Payer: Self-pay | Admitting: Nurse Practitioner

## 2018-10-14 DIAGNOSIS — J9601 Acute respiratory failure with hypoxia: Secondary | ICD-10-CM

## 2018-10-14 DIAGNOSIS — R0602 Shortness of breath: Secondary | ICD-10-CM

## 2018-10-14 DIAGNOSIS — Z515 Encounter for palliative care: Secondary | ICD-10-CM

## 2018-10-14 NOTE — Progress Notes (Signed)
SLP Cancellation Note  Patient Details Name: Noah Bush. MRN: [id number] DOB: 1940-12-31   Cancelled treatment:       Reason Eval/Treat Not Completed: Other (comment) SLP was consulted for speech/language/cognition assessment prior to extubation. Pt's case was discussed with his nurse, TK, and he indicated that the pt is now comfort measures only. SLP will therefore sign off at this time.   Norberta Stobaugh I. Vear Clock, MS, CCC-SLP Acute Rehabilitation Services Office number [phone] Pager [phone]  Scheryl Marten 10/14/2018, 9:38 AM

## 2018-10-14 NOTE — Progress Notes (Signed)
25 ml Versed wasted in sink with Advertising account executive.

## 2018-10-14 NOTE — Telephone Encounter (Signed)
Called Noah Bush to reschedule follow-up visit that is scheduled for 10/25/18, there was no answer.  Left message with reason for call along with my name and call back number.

## 2018-10-14 NOTE — Progress Notes (Signed)
STROKE TEAM PROGRESS NOTE   SUBJECTIVE (INTERVAL HISTORY) Patient was extubated yesterday after discussion of palliative care team with family and is now DNR.  He is on morphine drip and resting comfortably.  Family still wants tube feeds to continue through his PEG tube.  Palliative care team plan further discussion with family today about transitioning him to full comfort care  OBJECTIVE Vitals:   10/14/18 0800 10/14/18 0900 10/14/18 1000 10/14/18 1100  BP: (!) 102/46     Pulse: 72 73 62 69  Resp: 19 18 14 17   Temp:      TempSrc:      SpO2: 100% 98% 100% 100%  Weight:      Height:        CBC:  Recent Labs  Lab 10/11/2018 1242  10/11/18 0514 10/12/18 0148  WBC 7.9   < > 9.6 9.4  NEUTROABS 3.4  --   --   --   HGB 11.8*   < > 9.7* 8.7*  HCT 35.9*   < > 29.8* 27.2*  MCV 83.3   < > 83.5 84.2  PLT 219   < > 197 210   < > = values in this interval not displayed.    Basic Metabolic Panel:  Recent Labs  Lab 10/09/18 0436 10/09/18 1615 10/10/18 0258 10/11/18 1842 10/12/18 0148  NA 142  --  143 145 146*  K 4.3  --  4.3 4.7 3.7  CL 109  --  115* 116* 116*  CO2 21*  --  19* 18* 21*  GLUCOSE 124*  --  127* 102* 118*  BUN 31*  --  37* 35* 35*  CREATININE 1.26*  --  1.32* 1.11 1.07  CALCIUM 9.1  --  8.8* 8.7* 8.7*  MG 1.6* 1.9 1.9 1.8  --   PHOS 3.1 2.8  --   --   --     IMAGING past 24h No results found.   PHYSICAL EXAM    Elderly African-American gentleman who is  not in distress. . Afebrile. Head is nontraumatic. Neck is supple without bruit.    Cardiac exam no murmur or gallop. Lungs are clear to auscultation. Distal pulses are well felt. Neurological Exam :  Patient is   unresponsive.  He is on morphine drip.  Eyes open partially to sternal rub.  But he is globally aphasic and not following any commands.  Left gaze preference.  But does have some spontaneous side-to-side eye movements..  Pupils small irregular sluggish reactive.  No blink to threat on either side.   Left gaze deviation and even with doll's eye movements eyes do not move to the right.  Right lower facial weakness.  Tongue midline.  Dense right hemiplegia with hypotonia trace withdrawal in the lower extremity to pain but none in the upper extremity.  Spasticity and increased tone in the left upper and lower extremity with semipurposeful movements on the left side to painful stimuli.  Both plantars are upgoing.   ASSESSMENT/PLAN [name]. is a 78 y.o. male with history of prior stroke, Alzheimer's dementia, CHD, DM, Htn and gout presenting with unresponsiveness, plegic on the right and leftward eye deviation.   Suspect probably cardioembolic, given EF 68% in 2019 and ECG abnormalities. tPA Given Friday 5/8//20 @ 13:15.  Stroke: acute left basal ganglia infarct s/p tPA w/ small volume petechial hemorrhage - infarct likely embolic - mult sources:  EF, AF, L ICA stenosis  CT head - No acute left brain  insult identified. Old right MCA territory stroke progressed to encephalomalacia.  MRI head - 4 cm acute left basal ganglia infarct with small volume petechial hemorrhage.  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  Carotid Doppler - B ICA 1-39% stenosis, R VA antegrade. L VA not seen   2D Echo  - EF 20 - 25%. Possible source of emboli.  LDL - 103  HgbA1c - 6.4  UDS - not performed  VTE prophylaxis - SCDs  On aspirin 81 and plavix 75 mg daily PTA. No on low dose aspirin.   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 Pt of Dr. Clydene Fake in the past. Was seen by Hospice as an OP prior to current stroke and he elected to be a DNR  Palliative Care met with family (wife and 2 of the 4 kids) yesterday. Family not in agreement for for one-way extubation at that time.   CCM met with family and 1-way extubated 5/13 with plans for  transition to comfort care if became respiratory compromised  DNR/DNI  Survived over night without respiratory distress  Acute Respiratory Failure  Secondary to stroke  1-way extubation 5/13  PCCM signed off  Atrial Fibrillation  Home anticoagulation:  none   Not an AC candidate given petechial hemorrhage  On aspirin 81   Home meds: metoprolol 12.5, resumed  Hypertension  Stable on the low side . Home meds: metoprolol 12.5, resumed . Long-term BP goal normotensive  Hyperlipidemia  Lipid lowering medication PTA:  Lipitor 40 mg daily  LDL 103, goal < 70  Current lipid lowering medication: Lipitor 80 mg daily  Continue statin at discharge  Diabetes type II, controlled  HgbA1c 6.4, goal < 7.0  Chronic Dysphagia  TF via PEG  NPO  Other Stroke Risk Factors  Advanced age  ETOH use, advised to drink no more than 1 alcoholic beverage per day.  Overweight, Body mass index is 29.55 kg/m., recommend weight loss, diet and exercise as appropriate   Hx stroke/TIA  07/2017 - Right inferior MCA distribution late acute to subacute infarction involving temporal lobe, insula, and basal ganglia, with petechial hemorrhage. To CIR. Home w/ PEG.  Systolic CHF  Cardiomyopathy - EF 20 - 25%  Other Active Problems  Alzheimer's dementia on namenda  CKD - stage III   UTI with prior E coli  Mild anemia -    EKG showed R axis deviation, LBBB  Hospital day # 6   Patient has baseline Alzheimer's dementia and bihemispheric strokes with poor neurological prognosis.  Family is leaning towards comfort care and await final decision after discussion with palliative care team today.  Will consider transfer to hospice unit on morphine drip.  Greater than 50% time during this 25-minute visit was spent on coordination of care and discussion with care team.  Discussed with Dr. Rowe Pavy palliative care medicine  Antony Contras, MD Medical Director [place] Pager:  [phone] 10/14/2018 11:24 AM   To contact Stroke Continuity provider, please refer to [email or web address] After hours, contact General Neurology

## 2018-10-14 NOTE — Progress Notes (Signed)
Responded to spiritual care consult to support patient.  Patient unable to communicate.  Spoke with patient's nurse and got update.  Supported Haematologist.  Chaplain available as needed.  Venida Jarvis, [place], [facility], Pager [phone]

## 2018-10-14 NOTE — Progress Notes (Signed)
Nutrition Brief Note  Chart reviewed. 5/13 one-way extubation with plans for comfort care.  No further nutrition interventions warranted at this time.  Please re-consult as needed.   Kendell Bane RD, LDN, CNSC [phone] Pager [phone] After Hours Pager

## 2018-10-14 NOTE — Progress Notes (Addendum)
Daily Progress Note   Patient Name: Noah RumpfJames Schnetzer Jr.       Date: 10/14/2018 DOB: 05-Dec-1940  Age: 78 y.o. MRN#: [id number] Attending Physician: Micki RileySethi, Pramod S, MD Primary Care Physician: Garnet KoyanagiJohnson, Julie R, FNP Admit Date: 10/22/2018  Reason for Consultation/Follow-up: Establishing goals of care  Subjective:  Patient is now extubated, his eyes are some what open, he is not blinking  He doesn't follow commands, he withdraws to painful stimuli.   Discussed with bedside RN  see below.   Length of Stay: 6  Current Medications: Scheduled Meds:  . aspirin  81 mg Per Tube Daily  . atorvastatin  80 mg Per Tube q1800  . Chlorhexidine Gluconate Cloth  6 each Topical Q0600    Continuous Infusions: . sodium chloride Stopped (10/14/18 0947)  . dextrose    . feeding supplement (VITAL AF 1.2 CAL) Stopped (10/14/18 1300)  . midazolam Stopped (10/14/18 0726)  . morphine 1 mg/hr (10/14/18 1200)    PRN Meds: acetaminophen **OR** acetaminophen (TYLENOL) oral liquid 160 mg/5 mL **OR** acetaminophen, diphenhydrAMINE, glycopyrrolate **OR** glycopyrrolate **OR** glycopyrrolate, midazolam, midazolam, morphine injection, morphine, polyvinyl alcohol, senna-docusate  Physical Exam         Eyes some what open    ETT now removed Irregular Shallow regular breath sounds Has edema Not moving extremities today  Has PEG tube and for now, able to tolerate tube feeds.   Vital Signs: BP (!) 115/54 (BP Location: Left Arm)   Pulse (!) 33   Temp 99.3 F (37.4 C) (Axillary)   Resp 11   Ht 5\' 11"  (1.803 m)   Wt 96.1 kg   SpO2 100%   BMI 29.55 kg/m  SpO2: SpO2: 100 % O2 Device: O2 Device: Room Air O2 Flow Rate:    Intake/output summary:   Intake/Output Summary (Last 24 hours) at 10/14/2018 1311 Last  data filed at 10/14/2018 1200 Gross per 24 hour  Intake 442.56 ml  Output 630 ml  Net -187.44 ml   LBM:   Baseline Weight: Weight: 88.3 kg Most recent weight: Weight: 96.1 kg       Palliative Assessment/Data:    Flowsheet Rows     Most Recent Value  Intake Tab  Referral Department  Critical care  Unit at Time of Referral  ICU  Palliative Care Primary Diagnosis  Neurology  Date Notified  10/09/18  Palliative Care Type  Return patient Palliative Care  Reason for referral  Clarify Goals of Care  Date of Admission  10/10/2018  Date first seen by Palliative Care  10/10/18  # of days Palliative referral response time  1 Day(s)  # of days IP prior to Palliative referral  1  Clinical Assessment  Palliative Performance Scale Score  10%  Pain Max last 24 hours  Not able to report  Pain Min Last 24 hours  Not able to report  Dyspnea Max Last 24 Hours  Not able to report  Dyspnea Min Last 24 hours  Not able to report  Psychosocial & Spiritual Assessment  Palliative Care Outcomes  Patient/Family meeting held?  Yes  Who was at the meeting?  son via phone      Patient Active Problem List   Diagnosis Date Noted  . Stroke (cerebrum) (HCC) 10/13/2018  . Debility 09/21/2018  . Anorexia 09/21/2018  . Decreased appetite 08/20/2018  . General body deterioration 08/20/2018  . Palliative care encounter 08/20/2018  . Advanced care planning/counseling discussion   . Goals of care, counseling/discussion   . Severe sepsis (HCC) 12/23/2017  . AKI (acute kidney injury) (HCC) 12/23/2017  . UTI (urinary tract infection) 12/23/2017  . Acute cholecystitis 12/23/2017  . Cough   . Hypotension due to drugs   . Finger pain, right   . Poor nutrition   . Acute blood loss anemia   . 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 Acute ischemic right MCA stroke (HCC) 07/28/2017  . Diabetes mellitus type 2 in obese (HCC)   . Stage 3 chronic kidney disease (HCC)   . Morbid obesity (HCC)    . Cerebral infarction (HCC) 07/27/2017  . Hyperlipidemia   . Essential hypertension   . 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 Asymmetry of cerebral ventricles, Acute, Acquired  07/23/2017  . 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   Recommendations/Plan:   patient seen and examined, discussed with bedside RN, also discussed with Dr Pearlean Brownie, call placed and discussed with wife on the phone.    I updated her on the patient's condition. For now, appears to support the work of his breathing, is tolerating tube feeds, neurologically remains compromised due to devastating stroke effects. She is aware.   We discussed about next steps. Discussed about transfer out of the ICU to 6N, and she is agreeable. Longer term disposition options also discussed, talked with her about criteria for residential hospice versus home with hospice support.   For now, family wishes to continue tube feeds. We talked about if the patient was actively dying, he might reach a point where he might not absorb tube feeds, he might have complications such as aspiration, regurgitation abdominal distension and discomfort etc.   Tried to discuss with wife over the phone about artificial nutrition and hydration at end of life not being a very beneficial option, discussed with her about comfort feeds: offering few sips/bites of jello/pudding/apple sauce etc if the patient was some what more alert. If not, would recommend oral care and we talked about her and her family discussing about this.   PLAN: Transfer to 6N  Continue low dose morphine drip for now, also has bolus doses available.  Family wants home with hospice support, with continuation of tube feeds, at  least for a temporary basis, at this point, not ready to shut tube feeds off. PMT will try to coordinate with care management as well as hospice liaisons to help facilitate a safe discharge option for the patient.      Code Status:    Code Status Orders  (From admission, onward)         Start     Ordered   10/11/18 1605  Do not attempt resuscitation (DNR)  Continuous    Question Answer Comment  In the event of cardiac or respiratory ARREST Do not call a "code blue"   In the event of cardiac or respiratory ARREST Do not perform Intubation, CPR, defibrillation or ACLS   In the event of cardiac or respiratory ARREST Use medication by any route, position, wound care, and other measures to relive pain and suffering. May use oxygen, suction and manual treatment of airway obstruction as needed for comfort.      10/11/18 1604        Code Status History    Date Active Date Inactive Code Status Order ID Comments User Context   10/25/2018 1344 10/11/2018 1604 Full Code [id number]  Caryl Pina, MD ED   12/23/2017 2257 01/03/2018 1854 Full Code [id number]  Oralia Manis, MD Inpatient   08/13/2017 1711 08/25/2017 1443 Full Code [id number]  Gilmer Mor, DO Inpatient   07/28/2017 1832 08/13/2017 1711 Full Code [id number]  Jacquelynn Cree, PA-C Inpatient   07/28/2017 1832 07/28/2017 1832 Full Code [id number]  Jerene Pitch Inpatient   07/23/2017 1623 07/28/2017 1829 Full Code [id number]  Arlyce Harman, DO ED       Prognosis:   guarded  Likely not more than days to some very limited number of weeks at this point.   Discharge Planning:  Home with hospice.   Care plan was discussed with  Patient's wife on the phone.   RN also present in the room.   Thank you for allowing the Palliative Medicine Team to assist in the care of this patient.   Time In: 12 Time Out: 12.35 Total Time 35 Prolonged Time Billed No       Greater than 50%  of this time was spent counseling and coordinating care  related to the above assessment and plan.  Rosalin Hawking, MD [phone] Please contact Palliative Medicine Team phone at [phone] for questions and concerns.

## 2018-10-14 NOTE — Progress Notes (Signed)
NAME:  Noah Brodie., MRN:  [id number], DOB:  1940-07-01, LOS: 6 ADMISSION DATE:  10/14/2018, CONSULTATION DATE:  10/07/2018 REFERRING MD:  Dr. Otelia Limes, CHIEF COMPLAINT:  AMS   Brief History   78 y/o M who presented to [facility] ER with right sided weakness, LSN 1105.  CT head without acute hemorrhage or left sided hypodensity.  tPA administered. Intubated for AMS/poor airway protection.  Was seen by palliative care as outpt prior to admission and was DNR.  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  On plavix / ASA at baseline.  Alzheimer's Dementia  CKD  DM  HTN  UTI with prior E-Coli ACD  Significant Hospital Events   5/8 admit, tPA 5/11 DNR, no escalation of care  Consults:  Palliative care  Procedures:  5/8 ETT >>   Significant Diagnostic Tests:  CT Head 5/8 >> no acute left brain abnormality, old right MCA territory CVA that progressed to encephalomalacia MRI/MRA brain 5/9 >> acute Lt BG infarct, chronic Rt MCA infarct, chronic Lt cerebellar infarct, advanced atherosclerotic changes with severe left cavernous ICA and moderate to severe Lt M1 stenoses. TTE 5/9 >> EF 25-30%; diastolic dysfunction; neg bubble/septal defect.  Micro Data:  Covid 5/08 >> negative  Antimicrobials:  n/a   Interim history/subjective:  Remains intubated Low RR when on PSV/CPAP this morning, resumed full support   Objective   Blood pressure (!) 102/46, pulse 72, temperature 99.3 F (37.4 C), temperature source Axillary, resp. rate 19, height 5\' 11"  (1.803 m), weight 96.1 kg, SpO2 100 %.    Vent Mode: PRVC FiO2 (%):  [40 %] 40 % Set Rate:  [16 bmp] 16 bmp Vt Set:  [600 mL] 600 mL PEEP:  [5 cmH20] 5 cmH20 Plateau Pressure:  [18 cmH20-24 cmH20] 24 cmH20   Intake/Output Summary (Last 24 hours) at 10/14/2018 0858 Last data filed at 10/14/2018 0800 Gross per 24 hour  Intake 418.49 ml  Output 630 ml  Net  -211.51 ml   Filed Weights   10/10/18 0500 10/11/18 0500 10/13/18 0403  Weight: 92.4 kg 93.8 kg 96.1 kg    Examination:  General - Elderly male, laying in bed NAD  HEENT: NCAT, pink mmm. Trachea midline. Anicteric sclera  Cardiac - irregular, bradycardic. No JVD. 1+ peripheral pulses  Chest - Shallow, symmetrical respirations. No accessory muscle recruitment. CTA.  Abdomen - soft round ndnt. PEG present  Extremities - 1+ BLE edema. No obvious joint deformity. No cyanosis no clubbing  Skin - clean dry warm without rash  Neuro - Opens eyes spontaneously does not follow commands   Resolved Hospital Problem list     Assessment & Plan:   Acute hypoxic respiratory failure with compromised airway in setting of CVA. Plan -1 way extubation yesterday  - patient transition to full comfort care if acute respiratory distress vs. Enter hospice care  Acute Lt BG infarct s/p tPA. Hx of Rt MCA and Lt cerebellar infarcts. Alzheimer's dementia. Plan - per neurology  A fib with slow heart rate >> undetermined if new or old. Systolic CHF >> undetermined if new or old. Hx of HTN, HLD. DM II Chronic dysphagia with PEG -per neurology   Goals of Care: -Patient 1 way extubation yesterday. Family did not wish to initiate full comfort measures at the time and indicated transition to comfort care if after extubation patient exhibiting signs of respiratory distress, failure -PCM remains involved for  possible transition to hospice care  Thank you for consulting PCCM.  The patient is now extubated and is DNR/DNI. Goals of care discussions are ongoing, with options as hospice management with PCM vs full comfort care.  The patient no longer has ICU needs. At this time PCCM will sign off.    Best practice:  Diet: tube feeds DVT prophylaxis: SCDs GI prophylaxis: protonix Mobility: bed rest Code Status: DNR Disposition: ICU  Labs    CMP Latest Ref Rng & Units 10/12/2018 10/11/2018 10/10/2018   Glucose 70 - 99 mg/dL 213(Y118(H) 865(H102(H) 846(N127(H)  BUN 8 - 23 mg/dL 62(X35(H) 52(W35(H) 41(L37(H)  Creatinine 0.61 - 1.24 mg/dL 2.441.07 0.101.11 2.72(Z1.32(H)  Sodium 135 - 145 mmol/L 146(H) 145 143  Potassium 3.5 - 5.1 mmol/L 3.7 4.7 4.3  Chloride 98 - 111 mmol/L 116(H) 116(H) 115(H)  CO2 22 - 32 mmol/L 21(L) 18(L) 19(L)  Calcium 8.9 - 10.3 mg/dL 3.6(U8.7(L) 4.4(I8.7(L) 3.4(V8.8(L)  Total Protein 6.5 - 8.1 g/dL - - 5.5(L)  Total Bilirubin 0.3 - 1.2 mg/dL - - 0.6  Alkaline Phos 38 - 126 U/L - - 45  AST 15 - 41 U/L - - 16  ALT 0 - 44 U/L - - 10   CBC Latest Ref Rng & Units 10/12/2018 10/11/2018 10/10/2018  WBC 4.0 - 10.5 K/uL 9.4 9.6 9.4  Hemoglobin 13.0 - 17.0 g/dL 4.2(V8.7(L) 9.5(G9.7(L) 3.8(V9.9(L)  Hematocrit 39.0 - 52.0 % 27.2(L) 29.8(L) 30.1(L)  Platelets 150 - 400 K/uL 210 197 187   ABG    Component Value Date/Time   PHART 7.404 10/11/2018 1458   PCO2ART 39.2 10/20/2018 1458   PO2ART 477 (H) 10/20/2018 1458   HCO3 24.0 10/14/2018 1458   TCO2 20 (L) 07/23/2017 1334   ACIDBASEDEF 0.1 10/05/2018 1458   O2SAT 99.4 10/13/2018 1458   CBG (last 3)  Recent Labs    10/13/18 0820 10/13/18 1202 10/13/18 1545  GLUCAP 95 120* 110*    Tessie FassGrace Arrayah Connors MSN, AGACNP-BC [facility] [phone] If no answer, [phone] 10/14/2018, 8:58 AM

## 2018-10-15 ENCOUNTER — Telehealth: Payer: Self-pay | Admitting: Nurse Practitioner

## 2018-10-15 ENCOUNTER — Telehealth: Payer: Self-pay

## 2018-10-15 DIAGNOSIS — Z515 Encounter for palliative care: Secondary | ICD-10-CM

## 2018-10-15 DIAGNOSIS — R4182 Altered mental status, unspecified: Secondary | ICD-10-CM

## 2018-10-15 DIAGNOSIS — R0602 Shortness of breath: Secondary | ICD-10-CM

## 2018-10-15 MED ORDER — GLYCOPYRROLATE 1 MG PO TABS
1.0000 mg | ORAL_TABLET | ORAL | Status: DC | PRN
  Filled 2018-10-15: qty 1

## 2018-10-15 MED ORDER — GLYCOPYRROLATE 0.2 MG/ML IJ SOLN
0.4000 mg | INTRAMUSCULAR | Status: DC | PRN
  Administered 2018-10-15 (×3): 0.4 mg via INTRAVENOUS
  Filled 2018-10-15 (×3): qty 2

## 2018-10-15 MED ORDER — GLYCOPYRROLATE 0.2 MG/ML IJ SOLN: 0.2000 mg | INTRAMUSCULAR | Status: DC | PRN

## 2018-10-16 NOTE — Progress Notes (Signed)
   09-Nov-2018 2351  Attending Physican Contact  Attending Physician Notified Y  Attending Physician (First and Last Name) S. Aroor  Will the above attending physician sign death certificate? Yes  Post Mortem Checklist  Date of Death 11/09/18  Time of Death 11-Oct-2308  Next of kin notified Yes  Name of next of kin notified of death Mrs. Mauzy  (11-09-18 10/12/2334)  Contact Person's Relationship to Patient Spouse  Contact Person's Phone Number [phone]  Contact Person's address [address] [place] [address]  Was the patient a No Code Blue or a Limited Code Blue? Yes  Did the patient die unattended? No  Patient restrained? Not applicable  Height 5\' 11"  (1.803 m)  Weight 96.1 kg  [facility]  Notification Date 11/09/18  Notification Time 2345  [place] [facility] Number [phone]  Is patient a potential donor? [facility]  Funeral home name/address/phone # Funeral Home not listed  Name/Address/Phone # of [facility]

## 2018-10-16 NOTE — Progress Notes (Signed)
Wasted Morphine 90 ml and Versed 18 ml at stericycle with Harriett Sine, RN.

## 2018-10-16 NOTE — Progress Notes (Signed)
   10/16/18 0030  Notifications  Patient Placement notified that Post Mortem checklist is complete Yes  Patient Placement notified body transferred Transported to [email or web address]

## 2018-10-16 NOTE — Progress Notes (Signed)
   10/16/18 0003  Notifications  Patient Placement notified that Post Mortem checklist is complete Yes

## 2018-10-16 NOTE — Progress Notes (Signed)
   10/16/18 0020  [facility] [facility]  Notification Date 10/16/18 (they called again to rule out for donor)  Notification Time 0020  [place] [facility] Number [phone] (received from [facility])  Is patient a potential donor? N  Autopsy  Autopsy requested by N/A  Medical Examiner  Is this a medical examiner's case? N

## 2018-10-16 NOTE — Progress Notes (Signed)
2310  Pt. Expired, death verified with Harriett Sine, RN. Notified MD and family.

## 2018-10-20 ENCOUNTER — Telehealth: Payer: Self-pay | Admitting: *Deleted

## 2018-10-20 NOTE — Telephone Encounter (Signed)
Received original D/C -D/c forwarded to Dr.Sood([facility]) for signature.

## 2018-10-22 NOTE — Telephone Encounter (Signed)
Retrieved D/C from [facility] - forwarded to Dr.Wert (Pulmonary) for signature.

## 2018-10-27 ENCOUNTER — Telehealth: Payer: Self-pay

## 2018-10-27 NOTE — Telephone Encounter (Signed)
Received dc back from Doctor Wert.  I called funeral home to let them know dc was ready for pickup.

## 2018-11-01 NOTE — Progress Notes (Signed)
Civil engineer, contracting [facility]) Hospice  Received referral from [facility] RN Case Manager, for hospice services at home once discharged.  Patient and chart are under review by [facility] physician for eligibility.  Pt currently has a hospital bed at home, and bolus tube feeds provided by [facility].  No other immediate DME needs to return home.  Pt will reside in [place], information passed to our [facility], who will follow up with the family to try to arrange discharge later today.  Thank you, Wallis Bamberg RN, BSN, CCRN [facility] Liaison  [phone]

## 2018-11-01 NOTE — TOC Initial Note (Addendum)
Transition of Care (TOC) - Initial/Assessment Note    Patient Details  Name: Noah Bush. MRN: [id number] Date of Birth: September 27, 1940  Transition of Care [facility]) CM/SW Contact:    Kingsley Plan, RN Phone Number: 2018/10/18, 11:06 AM  Clinical Narrative:    1218 Spoke with Diannia Ruder with [facility] they can admit patient tomorrow, asking for discharge to be tomorrow and clarification on TF continuous or bolus at home. Paged MD.         Discharge is tomorrow with bolus tube feeds    Spoke to patient's wife Kyrone Wittig [phone] via phone. She wants to take her husband home with hospice and tube feedings.Gold DNR form on chart for MD to sign  Patient will have 24 hour care provided by family at home. Family also looking into hiring private duty aides. Patient already has hospital bed at home and tube feedings through [facility] ( [employer]) . At discharge patient will transport by [facility]. Confirmed face sheet information.   Provided list of home agencies over phone. Pattricia Boss wants [facility] referral given to [facility], awaiting call back.  Expected Discharge Plan: Home w Hospice Care Barriers to Discharge: Continued Medical Work up   Patient Goals and CMS Choice     Choice offered to / list presented to : Spouse  Expected Discharge Plan and Services Expected Discharge Plan: Home w Hospice Care   Discharge Planning Services: CM Consult   Living arrangements for the past 2 months: Single Family Home                               Date [facility] Agency Contacted: 10-18-2018 Time HH Agency Contacted: 1005 Representative spoke with at [facility] Agency: Wallis Bamberg with [facility]   Prior Living Arrangements/Services Living arrangements for the past 2 months: Single Family Home Lives with:: Spouse Patient language and need for interpreter reviewed:: Yes        Need for Family Participation in Patient Care: Yes (Comment) Care giver support system in place?: Yes  (comment) Current home services: DME(hospital bed) Criminal Activity/Legal Involvement Pertinent to Current Situation/Hospitalization: No - Comment as needed  Activities of Daily Living Home Assistive Devices/Equipment: None ADL Screening (condition at time of admission) Patient's cognitive ability adequate to safely complete daily activities?: No Is the patient deaf or have difficulty hearing?: Yes Does the patient have difficulty seeing, even when wearing glasses/contacts?: Yes Does the patient have difficulty concentrating, remembering, or making decisions?: Yes Patient able to express need for assistance with ADLs?: No Does the patient have difficulty dressing or bathing?: No Independently performs ADLs?: No(bed bound, hx CVA) Communication: Dependent Is this a change from baseline?: Pre-admission baseline Dressing (OT): Dependent Is this a change from baseline?: Pre-admission baseline Grooming: Dependent Is this a change from baseline?: Pre-admission baseline Feeding: Dependent Is this a change from baseline?: Pre-admission baseline Bathing: Dependent Is this a change from baseline?: Pre-admission baseline Toileting: Dependent Is this a change from baseline?: Pre-admission baseline In/Out Bed: Dependent Is this a change from baseline?: Pre-admission baseline Walks in Home: Dependent Is this a change from baseline?: Pre-admission baseline Does the patient have difficulty walking or climbing stairs?: Yes Weakness of Legs: Both Weakness of Arms/Hands: Both  Permission Sought/Granted   Permission granted to share information with : Yes, Verbal Permission Granted     Permission granted to share info w AGENCY: [facility]        Emotional Assessment  Admission diagnosis:  Altered mental status, unspecified altered mental status type [R41.82] Cerebrovascular accident (CVA), unspecified mechanism (HCC) [I63.9] Patient Active Problem List   Diagnosis Date  Noted  . Altered mental status   . Palliative care by specialist   . Shortness of breath   . Acute respiratory failure with hypoxia (HCC)   . Stroke (cerebrum) (HCC) 10/27/2018  . Debility 09/21/2018  . Anorexia 09/21/2018  . Decreased appetite 08/20/2018  . General body deterioration 08/20/2018  . Palliative care encounter 08/20/2018  . Advanced care planning/counseling discussion   . Goals of care, counseling/discussion   . Severe sepsis (HCC) 12/23/2017  . AKI (acute kidney injury) (HCC) 12/23/2017  . UTI (urinary tract infection) 12/23/2017  . Acute cholecystitis 12/23/2017  . Cough   . Hypotension due to drugs   . Finger pain, right   . Poor nutrition   . Acute blood loss anemia   . 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 Acute ischemic stroke (HCC) 07/28/2017  . Diabetes mellitus type 2 in obese (HCC)   . Stage 3 chronic kidney disease (HCC)   . Morbid obesity (HCC)   . Cerebral infarction (HCC) 07/27/2017  . Hyperlipidemia   . Essential hypertension   . 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 Asymmetry of cerebral ventricles, Acute, Acquired  07/23/2017  . Encounter for screening colonoscopy 03/12/2015   PCP:  Garnet KoyanagiJohnson, Julie R, FNP Pharmacy:   [facility] [place] [address] [place] [address] Phone: [phone] Fax: [phone]  [facility] [place] [address] [place] [address] Phone: [phone] Fax: [phone]     Social Determinants of Health (SDOH) Interventions    Readmission Risk Interventions No flowsheet data found.

## 2018-11-01 NOTE — Progress Notes (Signed)
STROKE TEAM PROGRESS NOTE   SUBJECTIVE (INTERVAL HISTORY) Patient is resting comfortably.  He is on morphine drip.  He has some upper airway secretions.  He remains unresponsive.  OBJECTIVE Vitals:   10/14/18 1200 10/14/18 1300 10/14/18 1400 10/14/18 1533  BP: (!) 115/54   106/81  Pulse: (!) 33 66 (!) 46 (!) 108  Resp: 11 19 10    Temp:    98.4 F (36.9 C)  TempSrc:    Oral  SpO2: 100% 100% 99% 94%  Weight:      Height:         PHYSICAL EXAM     Elderly African-American gentleman who is  not in distress. . Afebrile. Head is nontraumatic. Neck is supple without bruit.    Cardiac exam no murmur or gallop. Lungs are clear to auscultation. Distal pulses are well felt. Neurological Exam :  Patient is   unresponsive.  He is on morphine drip.  Eyes open partially to sternal rub with tonic upgaze deviation.  But he is globally aphasic and not following any commands.  Left gaze preference.  But does have some spontaneous side-to-side eye movements..  Pupils small irregular sluggish reactive.  No blink to threat on either side.  Left gaze deviation and even with doll's eye movements eyes do not move to the right.  Right lower facial weakness.  Tongue midline.  Dense right hemiplegia with hypotonia trace withdrawal in the lower extremity to pain but none in the upper extremity.  Spasticity and increased tone in the left upper and lower extremity with semipurposeful movements on the left side to painful stimuli.  Both plantars are upgoing.   ASSESSMENT/PLAN [name] is a 78 y.o. male with history of prior stroke, Alzheimer's dementia, CHD, DM, Htn and gout presenting with unresponsiveness, plegic on the right and leftward eye deviation.   Suspect probably cardioembolic, given EF 40% in 2019 and ECG abnormalities. tPA Given Friday 5/8//20 @ 13:15.  Stroke: acute left basal ganglia infarct s/p tPA w/ small volume petechial hemorrhage - infarct likely embolic - mult sources:  EF, AF, L ICA  stenosis  CT head - No acute left brain insult identified. Old right MCA territory stroke progressed to encephalomalacia.  MRI head - 4 cm acute left basal ganglia infarct with small volume petechial hemorrhage.  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  Carotid Doppler - B ICA 1-39% stenosis, R VA antegrade. L VA not seen   2D Echo  - EF 20 - 25%. Possible source of emboli.  LDL - 103  HgbA1c - 6.4  UDS - not performed  VTE prophylaxis - SCDs  On aspirin 81 and plavix 75 mg daily PTA. No on low dose aspirin.   Therapy recommendations:  SNF or LTACH  Disposition:  Home with Hospice  Palliative care following and assisting family with transition. Family wants ongoing TF, which was in place PTA w/ [facility]. Has Hopsital bed at home.  Comfort Care  DNR  Acute Respiratory Failure  Secondary to stroke  1-way extubation 5/13  Atrial Fibrillation  Home anticoagulation:  none   Not an AC candidate given petechial hemorrhage and planned comfort care  Hypertension . Home meds: metoprolol 12.5 .  Hyperlipidemia  Lipid lowering medication PTA:  Lipitor 40 mg daily  LDL 103, goal < 70  Diabetes type II, controlled  HgbA1c 6.4, goal < 7.0  Chronic Dysphagia  TF via  PEG  NPO  Other Stroke Risk Factors  Advanced age  ETOH use  Overweight, Body mass index is 29.55 kg/m.  Hx stroke/TIA  07/2017 - Right inferior MCA distribution late acute to subacute infarction involving temporal lobe, insula, and basal ganglia, with petechial hemorrhage. To CIR. Home w/ PEG.  Systolic CHF  Cardiomyopathy - EF 20 - 25%  Other Active Problems  Alzheimer's dementia on namenda  CKD - stage III   UTI with prior E coli  Mild anemia    EKG showed R axis deviation, LBBB  Hospital day # 7 Family has agreed to comfort care measures but prefer tube feeds to continue through his PEG tube for now.  Palliative  care team is working with family to arrange hospice at home soon.  Continue present management  Delia Heady, MD Medical Director [facility] Pager: [phone] 10/09/2018 11:33 AM   To contact Stroke Continuity provider, please refer to [email or web address]. After hours, contact General Neurology

## 2018-11-01 NOTE — Discharge Summary (Signed)
Patient ID: [name]. MRN: [id number] DOB/AGE: Oct 12, 1940 78 y.o.  Admit date: 10/16/2018 Death date: 2018-10-24 at September 24, 2308  Admission Diagnoses:Acute onset of right hemiplegia and unresponsiveness  Cause of Death: Respiratory failure secondary to aspiration pneumonia in patient with acute left basal ganglia infarct treated with TPA and poor neurological baseline from prior stroke and dementia.  Patient made DNR and comfort care upon family's request  Pertinent Medical Diagnosis: Active Problems:   Acute ischemic stroke (HCC)   Stroke (cerebrum) (HCC)   Acute respiratory failure with hypoxia (HCC)   Altered mental status   Palliative care by specialist   Shortness of breath   Hospital Course:  [name]. is a 78 y.o. male with history of prior stroke, Alzheimer's dementia, CHD, DM, Htn and gout presenting with unresponsiveness, plegic on the right and leftward eye deviation.   Suspect probably cardioembolic, given EF 40% in 09-24-2017 and ECG abnormalities. tPA Given Friday 5/8//20 @ 13:15.  He was admitted to the intensive care unit but did not show any significant neurological improvement.  His blood pressure was tightly controlled.  MRI showed a large 4 cm left basal ganglia infarct with small volume petechial hemorrhage and MRA showed severe left cavernous ICA and moderate to severe left M1 stenosis.  Patient had poor neurological baseline from Alzheimer's dementia and spastic left hemiplegia and would clearly need tracheostomy and feeding tube prolonged nursing home stay which the family did not want.  Palliative care team was consulted who spoke to the family who agreed to one way extubation followed by.DNR and comfort care measures only.  Patient was started on morphine drip and transferred to palliative care unit kept comfortable.  He passed away peacefully on  on 2018/10/24.   Signed: Delia Heady 10/20/2018, 8:08 AM

## 2018-11-01 NOTE — Progress Notes (Signed)
Daily Progress Note   Patient Name: Noah RumpfJames Switala Jr.       Date: 05/26/2019 DOB: Aug 16, 1940  Age: 78 y.o. MRN#: [id number] Attending Physician: Micki RileySethi, Pramod S, MD Primary Care Physician: Garnet KoyanagiJohnson, Julie R, FNP Admit Date: 10/02/2018  Reason for Consultation/Follow-up: Establishing goals of care  Subjective:  Patient is now on 6N, in mild distress due to excessive secretions, remains on morphine drip. Discussed with bedside RN about Robinul IV PRN.  Call placed, unable to reach wife this am, but note that hospice RN has been able to get in touch with her.  Plans underway for the patient to go home with hospice soon, as per family wishes.  He doesn't follow commands, he withdraws to painful stimuli.     see below.   Length of Stay: 7  Current Medications: Scheduled Meds:  . Chlorhexidine Gluconate Cloth  6 each Topical Q0600    Continuous Infusions: . feeding supplement (VITAL AF 1.2 CAL) Stopped (10/14/18 1300)  . midazolam 1 mg/hr (2018/10/12 0131)  . morphine 1 mg/hr (10/14/18 1200)    PRN Meds: [DISCONTINUED] acetaminophen **OR** acetaminophen (TYLENOL) oral liquid 160 mg/5 mL **OR** acetaminophen, diphenhydrAMINE, glycopyrrolate **OR** glycopyrrolate **OR** glycopyrrolate, midazolam, midazolam, morphine injection, morphine, polyvinyl alcohol  Physical Exam         Eyes not open    ETT  removed Irregular Coarse rhonchorous breath sounds Has edema Not moving extremities   Has PEG tube and for now, able to tolerate tube feeds.   Vital Signs: BP 106/81 (BP Location: Left Wrist)   Pulse (!) 108   Temp 98.4 F (36.9 C) (Oral)   Resp 10   Ht 5\' 11"  (1.803 m)   Wt 96.1 kg   SpO2 94%   BMI 29.55 kg/m  SpO2: SpO2: 94 % O2 Device: O2 Device: Room Air O2 Flow Rate:     Intake/output summary:   Intake/Output Summary (Last 24 hours) at 05/26/2019 1114 Last data filed at 05/26/2019 0900 Gross per 24 hour  Intake 2.15 ml  Output 1150 ml  Net -1147.85 ml   LBM: Last BM Date: (UTA) Baseline Weight: Weight: 88.3 kg Most recent weight: Weight: 96.1 kg       Palliative Assessment/Data:    Flowsheet Rows     Most Recent Value  Intake Tab  Referral Department  Critical care  Unit at Time of Referral  ICU  Palliative Care Primary Diagnosis  Neurology  Date Notified  10/09/18  Palliative Care Type  Return patient Palliative Care  Reason for referral  Clarify Goals of Care  Date of Admission  10/13/2018  Date first seen by Palliative Care  10/10/18  # of days Palliative referral response time  1 Day(s)  # of days IP prior to Palliative referral  1  Clinical Assessment  Palliative Performance Scale Score  10%  Pain Max last 24 hours  Not able to report  Pain Min Last 24 hours  Not able to report  Dyspnea Max Last 24 Hours  Not able to report  Dyspnea Min Last 24 hours  Not able to report  Psychosocial & Spiritual Assessment  Palliative Care Outcomes  Patient/Family meeting held?  Yes  Who was at the meeting?  son via phone      Patient Active Problem List   Diagnosis Date Noted  . Altered mental status   . Palliative care by specialist   . Shortness of breath   . Acute respiratory failure with hypoxia (HCC)   . Stroke (cerebrum) (HCC) 10/27/2018  . Debility 09/21/2018  . Anorexia 09/21/2018  . Decreased appetite 08/20/2018  . General body deterioration 08/20/2018  . Palliative care encounter 08/20/2018  . Advanced care planning/counseling discussion   . Goals of care, counseling/discussion   . Severe sepsis (HCC) 12/23/2017  . AKI (acute kidney injury) (HCC) 12/23/2017  . UTI (urinary tract infection) 12/23/2017  . Acute cholecystitis 12/23/2017  . Cough   . Hypotension due to drugs   . Finger pain, right   . Poor nutrition   . Acute  blood loss anemia   . 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 Acute ischemic stroke (HCC) 07/28/2017  . Diabetes mellitus type 2 in obese (HCC)   . Stage 3 chronic kidney disease (HCC)   . Morbid obesity (HCC)   . Cerebral infarction (HCC) 07/27/2017  . Hyperlipidemia   . Essential hypertension   . 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 Asymmetry of cerebral ventricles, Acute, Acquired  07/23/2017  . 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   Recommendations/Plan:   continue current mode of care  Home with hospice soon  No additional PMT specific interventions at this time  Adjusted dose of IV PRN Robinul.      Code Status:    Code Status Orders  (From admission, onward)         Start     Ordered   10/11/18 1605  Do not attempt resuscitation (DNR)  Continuous    Question Answer Comment  In the event of cardiac or respiratory ARREST Do not call a "code blue"   In the event of cardiac or respiratory ARREST Do not perform Intubation, CPR, defibrillation or ACLS   In the event of cardiac or respiratory ARREST Use medication by any route, position, wound care, and other measures to relive pain and suffering. May use oxygen, suction and manual treatment of airway obstruction as needed for comfort.      10/11/18 1604        Code Status History  Date Active Date Inactive Code Status Order ID Comments User Context   10/19/2018 1344 10/11/2018 1604 Full Code [id number]  Caryl Pina, MD ED   12/23/2017 2257 01/03/2018 1854 Full Code [id number]  Oralia Manis, MD Inpatient   08/13/2017 1711 08/25/2017 1443 Full Code [id number]  Gilmer Mor, DO Inpatient   07/28/2017 1832 08/13/2017 1711 Full Code  [id number]  Jacquelynn Cree, PA-C Inpatient   07/28/2017 1832 07/28/2017 1832 Full Code [id number]  Jerene Pitch Inpatient   07/23/2017 1623 07/28/2017 1829 Full Code [id number]  Arlyce Harman, DO ED       Prognosis:   guarded  Likely not more than days to less than 2 weeks at this point.   Discharge Planning:  Home with hospice.   Care plan was discussed with  Bedside RN, palliative RN who is assisting with hospice arrangements.    Thank you for allowing the Palliative Medicine Team to assist in the care of this patient.   Time In: 10 Time Out: 10.35 Total Time 35 Prolonged Time Billed No       Greater than 50%  of this time was spent counseling and coordinating care related to the above assessment and plan.  Rosalin Hawking, MD [phone] Please contact Palliative Medicine Team phone at [phone] for questions and concerns.

## 2018-11-01 NOTE — Telephone Encounter (Signed)
Received call back from patient's son Lenon Curt, explained to him that I needed to reschedule the follow-up appointment that was scheduled for 10/25/18 due to the office being closed.  He stated that his Dad had been admitted to [facility]. Relayed information to Palliative Nurse Practitioner and to [facility].

## 2018-11-01 NOTE — Progress Notes (Signed)
Follow up on current in home palliative care patien who was admitted to [facility] for CVA.  Family is post palliative consult to discuss goals of care and they are now choosing hospice care and wish to take Noah Bush home for end of life.  Patient is unresponsive with little hope for a meaningful recovery.  I spoke with patient's wife, Noah Bush via the telephone.  She confirms wanting to bring her husband home and states she is ready for him whenever the hospital feels they can discharge him.  I spoke to Star about hospice at home services.  She states [name] currently has a hospital bed that he owns and states she does not feel there are any other DME needs.  She states he currently has pull ups that she will continue to use.  I notified her of hospice coverage for supplies and she will discuss further with recommendations from the admission nurse.  We also talked briefly about planned bolus tube feeds at home, which at this time, they are wanting to continue.  They will need additional and ongoing discussions about end of life care and continuing tube feeds.  This conversation was had by palliative care and I also touched on this subject.  She again told me for now- they would like to try tube feeds in the home.  [name] had tube feeds in the past, but Mrs. Glandon states that they will need cans of the tube feed.  In the past, he has used ensure.   Please send patient home with nutrition for tube feeds until we can evaluate and get it ordered if needed.  Information faxed to referral intake. We can offer admission tomorrow if he is ready for discharge.    Norris Cross, RN Clinical Nurse Liaison [facility] [phone]

## 2018-11-01 DEATH — deceased

## 2019-10-07 IMAGING — CT CT ABD-PELV W/ CM
2 of 5 series · 17 of 46 positions shown, 19 images · IV contrast (APPLIED)
Comparison: 01/29/2013

CLINICAL DATA: Lower abdominal pain.

EXAM:
CT ABDOMEN AND PELVIS WITH CONTRAST
TECHNIQUE: Multidetector CT imaging of the abdomen and pelvis was performed
using the standard protocol following bolus administration of
intravenous contrast.
CONTRAST:  100mL [id number]-[id number] IOPAMIDOL (TRFB9C-XZZ) INJECTION 61%

[Series 2: routine abd/pel with · axial · 0.98mm/px · z∈[-483,-63]mm · 14 of 96 slices shown, 16 images]
[im 6/96  soft-tissue]
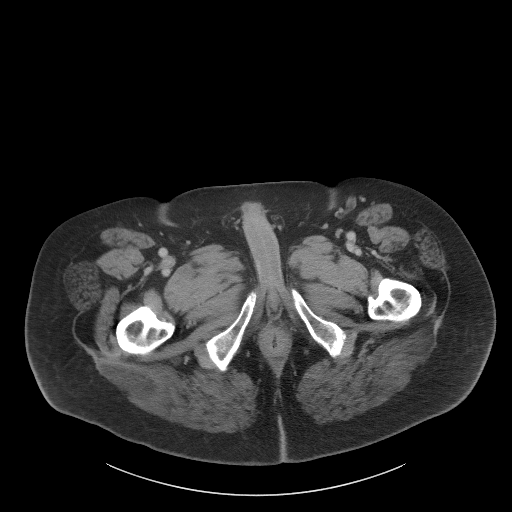
[im 6/96  bone]
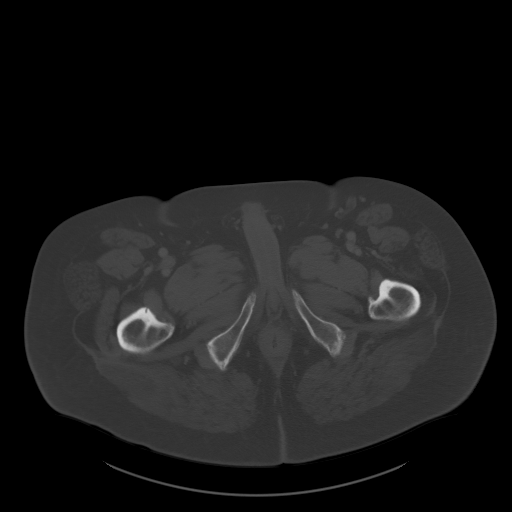
[im 11/96  soft-tissue]
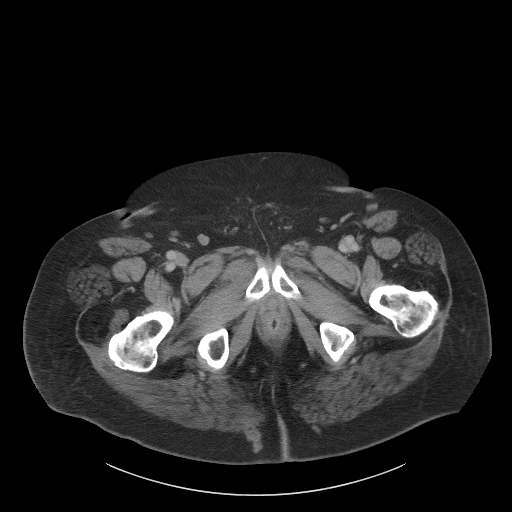
[im 22/96  soft-tissue]
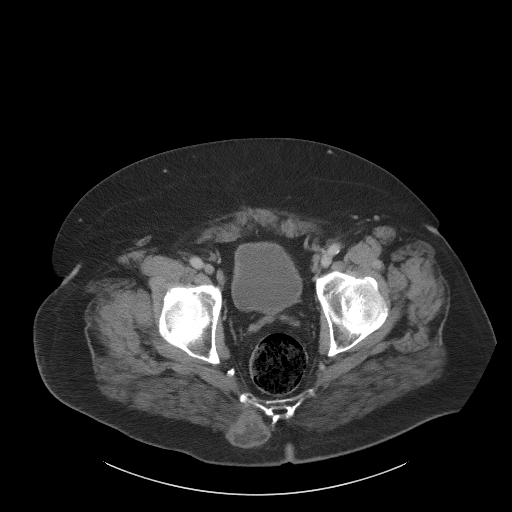
[im 27/96  soft-tissue]
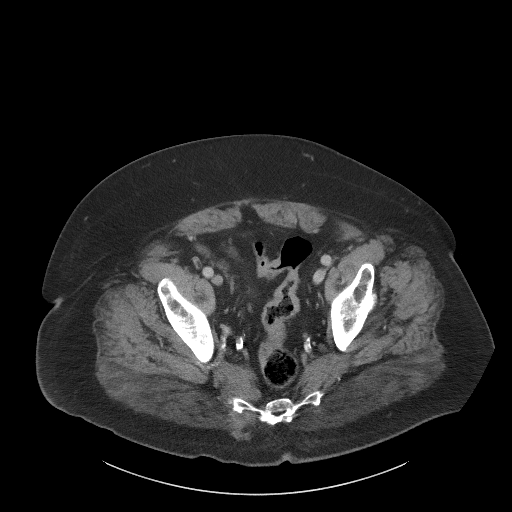
[im 32/96  soft-tissue]
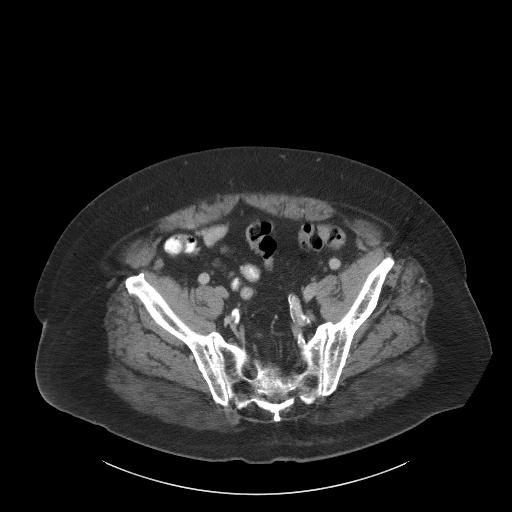
[im 37/96  soft-tissue]
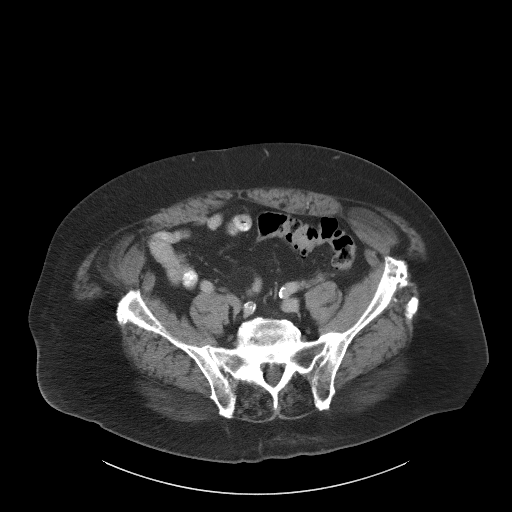
[im 43/96  soft-tissue]
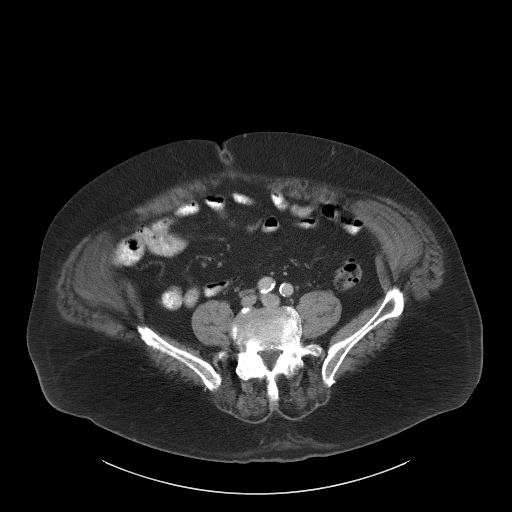
[im 53/96  soft-tissue]
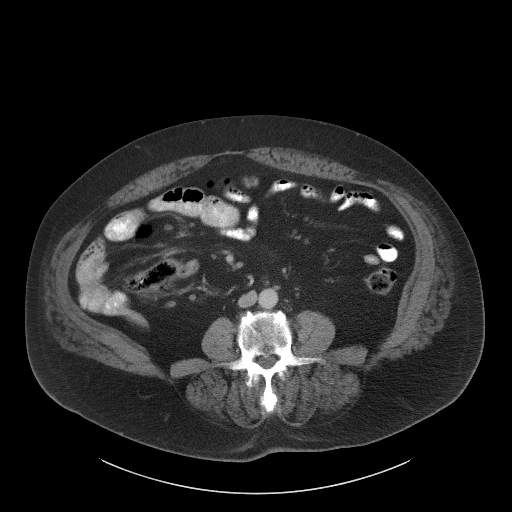
[im 59/96  soft-tissue]
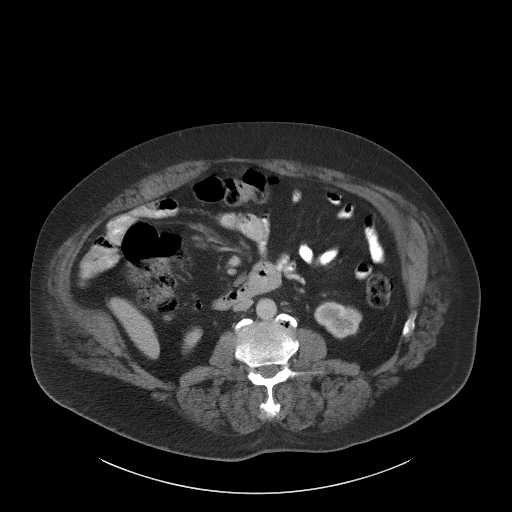
[im 59/96  bone]
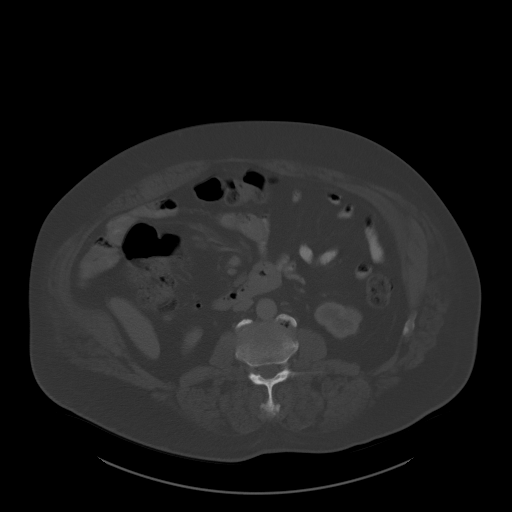
[im 64/96  soft-tissue]
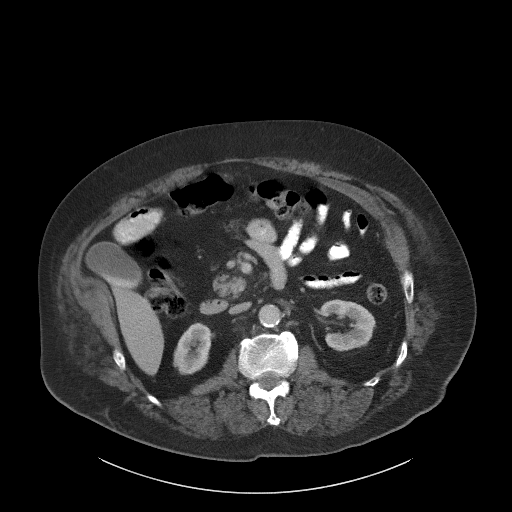
[im 69/96  soft-tissue]
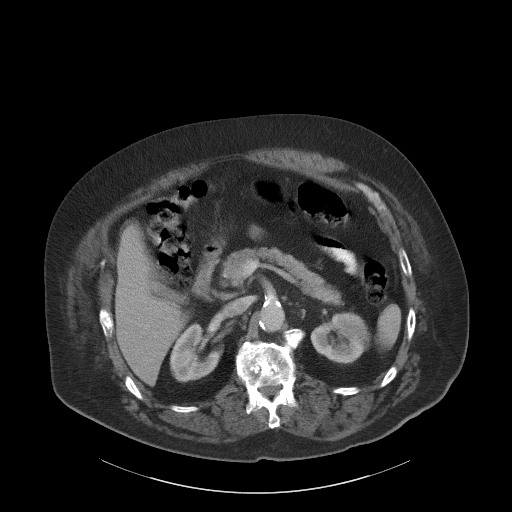
[im 74/96  soft-tissue]
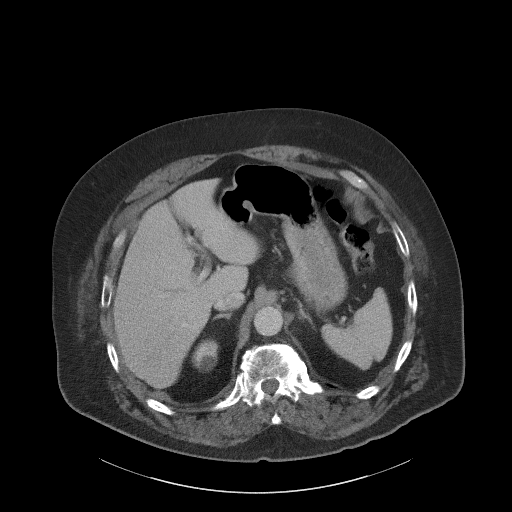
[im 85/96  soft-tissue]
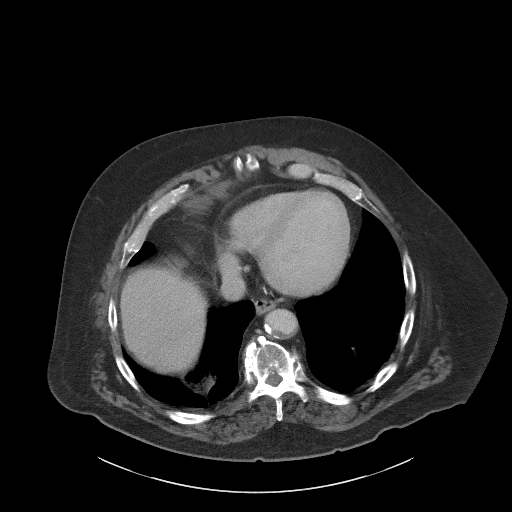
[im 90/96  soft-tissue]
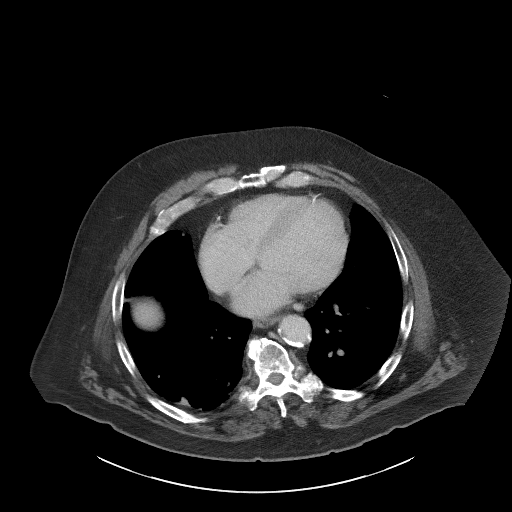

[Series 5: coronal st · coronal · 0.91mm/px · 3 of 111 slices shown]
[im 37/111  soft-tissue]
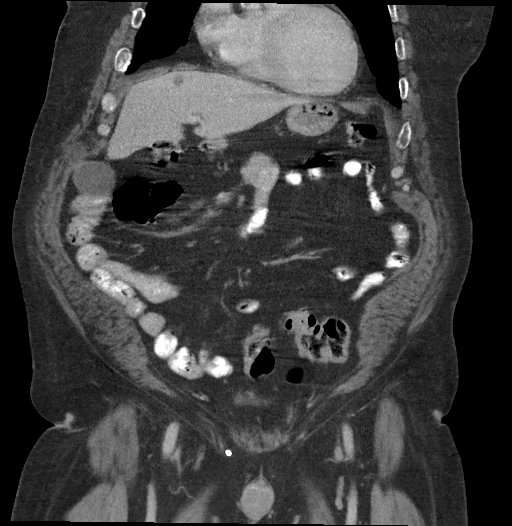
[im 49/111  soft-tissue]
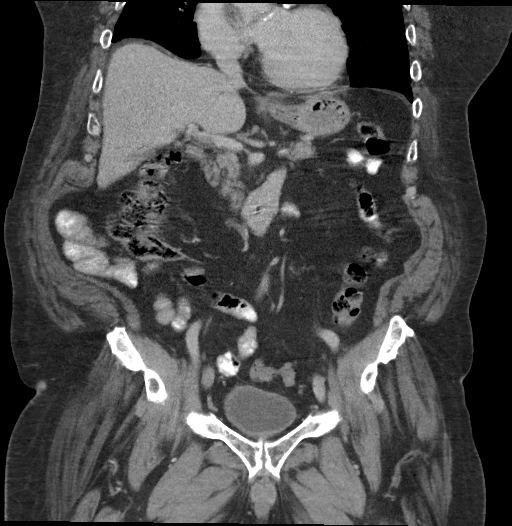
[im 62/111  soft-tissue]
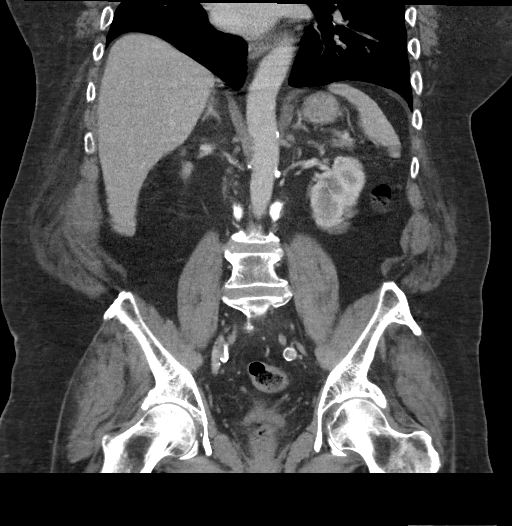

[17 of 46 positions shown; findings below may reference images not displayed]

FINDINGS: Lower chest: Linear bibasilar scarring. No effusions. Coronary
artery calcifications. Heart is borderline in size.

Hepatobiliary: Layering gallstones within the gallbladder. A small
hypodensity in the right hepatic lobe is stable since prior study
and likely reflects a small cyst. No biliary ductal dilatation.

Pancreas: No focal abnormality or ductal dilatation.

Spleen: No focal abnormality.  Normal size.

Adrenals/Urinary Tract: Several right renal cysts. No hydronephrosis
bilaterally. Adrenal glands and urinary bladder unremarkable.

Stomach/Bowel: Sigmoid diverticulosis. No active diverticulitis.
Appendix is normal. Stomach and small bowel decompressed,
unremarkable.

Vascular/Lymphatic: Diffuse aortic and iliac calcifications. No
aneurysm or adenopathy.

Reproductive: Central prostate calcifications.

Other: No free fluid or free air.

Musculoskeletal: No acute bony abnormality. Chronic appearing
compression fractures at T12 and L1.
IMPRESSION: Bibasilar scarring.

Layering high-density material within the gallbladder, likely small
stones.

Sigmoid diverticulosis.  No active diverticulitis.

Aortoiliac atherosclerosis.

No acute findings in the abdomen or pelvis.
